# Patient Record
Sex: Male | Born: 1975 | Race: White | Hispanic: No | Smoking: Never smoker
Health system: Southern US, Community
[De-identification: ages and names within clinical notes are randomized; demographics above are authoritative.]

## PROBLEM LIST (undated history)

## (undated) DIAGNOSIS — M7750 Other enthesopathy of unspecified foot: Secondary | ICD-10-CM

## (undated) DIAGNOSIS — Z87442 Personal history of urinary calculi: Secondary | ICD-10-CM

## (undated) DIAGNOSIS — R079 Chest pain, unspecified: Secondary | ICD-10-CM

## (undated) DIAGNOSIS — K219 Gastro-esophageal reflux disease without esophagitis: Secondary | ICD-10-CM

## (undated) DIAGNOSIS — D239 Other benign neoplasm of skin, unspecified: Secondary | ICD-10-CM

## (undated) DIAGNOSIS — Z8489 Family history of other specified conditions: Secondary | ICD-10-CM

## (undated) DIAGNOSIS — M722 Plantar fascial fibromatosis: Secondary | ICD-10-CM

## (undated) DIAGNOSIS — Z91018 Allergy to other foods: Secondary | ICD-10-CM

## (undated) DIAGNOSIS — G4700 Insomnia, unspecified: Secondary | ICD-10-CM

## (undated) DIAGNOSIS — Z8719 Personal history of other diseases of the digestive system: Secondary | ICD-10-CM

## (undated) DIAGNOSIS — J302 Other seasonal allergic rhinitis: Secondary | ICD-10-CM

## (undated) DIAGNOSIS — M2550 Pain in unspecified joint: Secondary | ICD-10-CM

## (undated) HISTORY — DX: Gastro-esophageal reflux disease without esophagitis: K21.9

## (undated) HISTORY — DX: Allergy to other foods: Z91.018

## (undated) HISTORY — DX: Plantar fascial fibromatosis: M72.2

## (undated) HISTORY — DX: Chest pain, unspecified: R07.9

## (undated) HISTORY — DX: Insomnia, unspecified: G47.00

## (undated) HISTORY — DX: Other enthesopathy of unspecified foot and ankle: M77.50

## (undated) HISTORY — DX: Pain in unspecified joint: M25.50

## (undated) HISTORY — DX: Other benign neoplasm of skin, unspecified: D23.9

## (undated) HISTORY — DX: Other seasonal allergic rhinitis: J30.2

---

## 2003-09-17 HISTORY — PX: OTHER SURGICAL HISTORY: SHX169

## 2006-04-04 ENCOUNTER — Ambulatory Visit: Payer: Self-pay | Admitting: Orthopaedic Surgery

## 2006-09-16 HISTORY — PX: SHOULDER SURGERY: SHX246

## 2010-09-04 ENCOUNTER — Ambulatory Visit: Payer: Self-pay | Admitting: Orthopaedic Surgery

## 2011-09-17 HISTORY — PX: KNEE SURGERY: SHX244

## 2011-11-05 ENCOUNTER — Inpatient Hospital Stay: Payer: Self-pay | Admitting: Internal Medicine

## 2011-11-05 LAB — URINALYSIS, COMPLETE
Bacteria: NONE SEEN
Bilirubin,UR: NEGATIVE
Blood: NEGATIVE
Glucose,UR: >=500 mg/dL (ref 0–75)
Nitrite: NEGATIVE
Ph: 5 (ref 4.5–8.0)
RBC,UR: 1 /HPF (ref 0–5)
Squamous Epithelial: NONE SEEN

## 2011-11-05 LAB — CBC WITH DIFFERENTIAL/PLATELET
Basophil #: 0 10*3/uL (ref 0.0–0.1)
Basophil %: 0.4 %
Eosinophil #: 0.1 10*3/uL (ref 0.0–0.7)
Eosinophil %: 1.2 %
HCT: 47.1 % (ref 40.0–52.0)
HGB: 16.3 g/dL (ref 13.0–18.0)
Lymphocyte #: 1.8 10*3/uL (ref 1.0–3.6)
Lymphocyte %: 18.6 %
MCH: 29.1 pg (ref 26.0–34.0)
MCHC: 34.6 g/dL (ref 32.0–36.0)
MCV: 84 fL (ref 80–100)
Neutrophil #: 7.2 10*3/uL — ABNORMAL HIGH (ref 1.4–6.5)
Neutrophil %: 73.7 %
RBC: 5.61 10*6/uL (ref 4.40–5.90)
RDW: 13 % (ref 11.5–14.5)

## 2011-11-05 LAB — COMPREHENSIVE METABOLIC PANEL
Albumin: 4.5 g/dL (ref 3.4–5.0)
Anion Gap: 17 — ABNORMAL HIGH (ref 7–16)
BUN: 21 mg/dL — ABNORMAL HIGH (ref 7–18)
Calcium, Total: 8.7 mg/dL (ref 8.5–10.1)
Chloride: 92 mmol/L — ABNORMAL LOW (ref 98–107)
Co2: 21 mmol/L (ref 21–32)
EGFR (African American): >60
Osmolality: 294 (ref 275–301)
SGOT(AST): 36 U/L (ref 15–37)
SGPT (ALT): 53 U/L
Total Protein: 8.5 g/dL — ABNORMAL HIGH (ref 6.4–8.2)

## 2011-11-06 LAB — BASIC METABOLIC PANEL
Anion Gap: 13 (ref 7–16)
Calcium, Total: 8.2 mg/dL — ABNORMAL LOW (ref 8.5–10.1)
Chloride: 107 mmol/L (ref 98–107)
Creatinine: 0.91 mg/dL (ref 0.60–1.30)
EGFR (African American): >60
EGFR (Non-African Amer.): >60
Glucose: 166 mg/dL — ABNORMAL HIGH (ref 65–99)
Potassium: 3.1 mmol/L — ABNORMAL LOW (ref 3.5–5.1)
Sodium: 142 mmol/L (ref 136–145)

## 2011-11-06 LAB — HEMOGLOBIN A1C: Hemoglobin A1C: 10.6 % — ABNORMAL HIGH (ref 4.2–6.3)

## 2011-11-07 LAB — BASIC METABOLIC PANEL
Anion Gap: 12 (ref 7–16)
Calcium, Total: 7.9 mg/dL — ABNORMAL LOW (ref 8.5–10.1)
Chloride: 109 mmol/L — ABNORMAL HIGH (ref 98–107)
Co2: 19 mmol/L — ABNORMAL LOW (ref 21–32)
Creatinine: 0.75 mg/dL (ref 0.60–1.30)
Potassium: 3.4 mmol/L — ABNORMAL LOW (ref 3.5–5.1)
Sodium: 140 mmol/L (ref 136–145)

## 2011-12-10 ENCOUNTER — Ambulatory Visit: Payer: Self-pay

## 2011-12-16 ENCOUNTER — Ambulatory Visit: Payer: Self-pay

## 2012-01-15 ENCOUNTER — Ambulatory Visit: Payer: Self-pay

## 2013-03-13 ENCOUNTER — Emergency Department: Payer: Self-pay | Admitting: Internal Medicine

## 2013-03-24 ENCOUNTER — Emergency Department: Payer: Self-pay | Admitting: Emergency Medicine

## 2013-04-27 ENCOUNTER — Ambulatory Visit: Payer: Self-pay

## 2013-05-17 ENCOUNTER — Ambulatory Visit: Payer: Self-pay

## 2013-05-20 ENCOUNTER — Ambulatory Visit: Payer: Self-pay | Admitting: Unknown Physician Specialty

## 2013-05-20 LAB — BASIC METABOLIC PANEL
Anion Gap: 6 — ABNORMAL LOW (ref 7–16)
BUN: 20 mg/dL — ABNORMAL HIGH (ref 7–18)
Calcium, Total: 8.9 mg/dL (ref 8.5–10.1)
Co2: 26 mmol/L (ref 21–32)
Creatinine: 0.99 mg/dL (ref 0.60–1.30)
EGFR (African American): >60
EGFR (Non-African Amer.): >60
Potassium: 3.7 mmol/L (ref 3.5–5.1)
Sodium: 138 mmol/L (ref 136–145)

## 2013-05-26 ENCOUNTER — Ambulatory Visit: Payer: Self-pay | Admitting: Orthopedic Surgery

## 2013-06-16 ENCOUNTER — Ambulatory Visit: Payer: Self-pay

## 2013-07-21 ENCOUNTER — Ambulatory Visit: Payer: Self-pay

## 2013-08-16 ENCOUNTER — Ambulatory Visit: Payer: Self-pay

## 2014-02-04 ENCOUNTER — Ambulatory Visit: Payer: Self-pay | Admitting: Family Medicine

## 2014-03-30 ENCOUNTER — Encounter: Payer: Self-pay | Admitting: *Deleted

## 2014-11-28 ENCOUNTER — Emergency Department: Payer: Self-pay | Admitting: Emergency Medicine

## 2015-01-06 NOTE — Op Note (Signed)
PATIENT NAMEKAJ, [name] MR#:  [id number] DATE OF BIRTH:  [birth year]  DATE OF PROCEDURE:  05/26/2013  PREOPERATIVE DIAGNOSIS: Right knee patellar dislocation.   POSTOPERATIVE DIAGNOSIS:  Right knee patellar dislocation with lateral meniscus tear and medial plica band.   PROCEDURES: 1.  Arthroscopy, right knee lateral release. 2.  Partial lateral meniscectomy.  3.  Excision plica.   ANESTHESIA: General.   SURGEON: Laurene Footman, M.D.   DESCRIPTION OF PROCEDURE: The patient was brought to the Operating Room and after adequate anesthesia was obtained, the leg was prepped and draped in the usual sterile fashion with a tourniquet applied to the upper leg. After patient identification and timeout procedures were completed, an inferolateral portal was made and the arthroscope was introduced. 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 There were multiple small fragments of loose cartilage floating around the knee, but nothing large enough to be a loose body. There was a very tight lateral patellofemoral ligament that came down as a band that appeared to be causing subluxation of the patella. Coming around medially, an inferomedial portal was made, and the medial meniscus was intact. There was again significant chondromalacia of the medial compartment. Anterior cruciate ligament was intact. Going laterally, there was a flap tear of the posterior horn of the lateral meniscus that with a portion flipped up into the notch, this was debrided with use of the meniscal punch and ArthroCare wand. Again, there was chondromalacia in the lateral compartment with some partial thickness cartilage loss in all compartments. The gutters were checked and there were no loose bodies. At this point, additionally, there had been noted a very thick medial plica. This was ablated at this point going laterally with the ArthroCare wand. The very thick ligament was released.  The capsule did not appear to be tight on the lateral side other than this tight band. After releasing this band, the patella tracked normally and so no further reconstruction was deemed required. The knee was very thoroughly irrigated to get rid of the small fragments. The instrumentation was withdrawn, portals closed with a 4-0 nylon in a simple interrupted fashion. 30 mL of 0.5% Sensorcaine with epinephrine was infiltrated into the portals and the area of the lateral release. Xeroform, 4 x 4's, Webril and Ace wrap were applied and the patient sent to  recovery in stable condition.    ESTIMATED BLOOD LOSS: Minimal.   COMPLICATIONS: None.   SPECIMEN: None.    ____________________________ Laurene Footman, MD mjm:nts D: 05/27/2013 02:09:06 ET T: 05/27/2013 03:27:47 ET JOB#: [id number]  cc: Laurene Footman, MD, <Dictator> Laurene Footman MD ELECTRONICALLY SIGNED 05/27/2013 11:12

## 2015-01-08 NOTE — Consult Note (Signed)
PATIENT NAMEANUP, [name] MR#:  [id number] DATE OF BIRTH:  [birth year]  DATE OF CONSULTATION:  11/06/2011  REFERRING PHYSICIAN:  Fritzi Mandes, MD  CONSULTING PHYSICIAN:  A. Lavone Orn, MD PRIMARY CARE PHYSICIAN: Dr. Luan Pulling, in [place]: New onset diabetes.   HISTORY OF PRESENT ILLNESS: This is a 39 year old male with a medical history of hypertension who was admitted yesterday with severe hyperglycemia due to newly diagnosed diabetes. He initially had blurred vision with polyuria and polydipsia, those symptoms have all improved. He did not have nausea or vomiting. He also reports an approximately 10-pound weight loss over the prior week. He has not had any recent illness. No new medication and specifically denies use of glucocorticoids. He was treated with IV fluids as well as IV insulin and monitored in the Cardiac Care Unit. He has been given subcutaneous Lantus 30 units at 9:00 this morning, and NovoLog 5 units t.i.d. before meals has been ordered. He has a diet ordered, and he tolerated his lunch today.  PAST MEDICAL HISTORY:  1. Hypertension.  2. Acid reflux.   OUTPATIENT MEDICATIONS:  1. Diovan/HCT 80/12.5 mg daily.  2. Nexium 40 mg daily.   ALLERGIES: No known drug allergies.   FAMILY HISTORY: Father has type 2 diabetes.   SOCIAL HISTORY: The patient is married. He does not smoke cigarettes and rarely drinks alcohol. He is employed.    REVIEW OF SYSTEMS: GENERAL: Weight loss as per history of present illness. He has had fatigue. HEENT: He has had blurred vision, this is improving. He denies sore throat. NECK: Denies neck pain or dysphagia. CARDIAC: Denies chest pain or palpitations. PULMONARY: Denies cough or shortness of breath. ABDOMEN: Denies abdominal pain. No nausea or vomiting. Appetite is fair. Denies change in bowel habits. EXTREMITIES: Denies leg swelling. SKIN: Denies rash or skin changes. NEUROLOGIC: Denies numbness or tingling in the feet. ENDOCRINE:  Denies heat or cold intolerance. LYMPH: Denies easy bruisability or recent bleeding.   PHYSICAL EXAMINATION:  VITAL SIGNS: Height 72.9 inches, weight 275 pounds. Pulse 86, respirations 18, blood pressure 123/81, pulse oximetry 100% on room air. Temperature 97.8.   GENERAL: A well-developed obese white male in no acute distress.   HEENT: Extraocular movements are intact. No proptosis, lid lag or stare.   NECK: No thyromegaly. No palpable thyroid nodules.   LYMPH: No submandibular or anterior cervical lymphadenopathy.   CARDIAC: Regular rate and rhythm without audible murmurs.   PULMONARY: Clear to auscultation bilaterally. No wheeze. No rhonchi.   ABDOMEN: Diffusely soft, nontender, nondistended. Positive bowel sounds.   EXTREMITIES: No edema is present.   SKIN: No rash or dermatopathy noted.   NEUROLOGIC: No sensory deficits. No tremor. Speech is normal.   PSYCHIATRIC: Alert and oriented x3.   LABORATORY, DIAGNOSTIC AND RADIOLOGICAL DATA:  Glucose 166, BUN 18, creatinine 0.91, sodium 142, potassium 3.1, chloride 107, CO2 22, calcium 8.2. Hemoglobin A1c 10.6%. A chest x-ray done earlier today showed no evidence of acute disease.   ASSESSMENT: A 39 year old male presenting with hyperglycemic hyperosmolar state due to new onset diabetes.   RECOMMENDATIONS:  1. I agree with transition off IV insulin. Current doses of ordered Lantus and NovoLog are reasonable. I will add a NovoLog sliding scale.  2. Ensure diabetes educators have been consulted. The patient needs to be instructed on self-monitoring of blood sugars as well as injections of insulin.  3. I am hopeful we can transition to diabetes oral medications in the future. However, I would  prefer we get adequate control of blood sugars on multiple daily injections of insulin initially.  4. He would benefit from weight loss and regular exercise.  5. I suggested a follow-up as outpatient in two weeks, and he was agreeable.   Thank  you for the kind request for consultation. I will follow along with you.   ____________________________ A. Lavone Orn, MD ams:cbb D: 11/06/2011 15:24:16 ET T: 11/06/2011 15:47:04 ET JOB#: [id number]  cc: A. Lavone Orn, MD, <Dictator> Sherlon Handing MD ELECTRONICALLY SIGNED 11/10/2011 15:34

## 2015-01-08 NOTE — H&P (Signed)
PATIENT NAMEKERRIE, [name] MR#:  [id number] DATE OF BIRTH:  1976-07-11  DATE OF ADMISSION:  11/05/2011  PRIMARY CARE PHYSICIAN: Dr. Luan Pulling   CHIEF COMPLAINT: Increased thirst, weakness and increased urination for a week to 10 days.   HISTORY OF PRESENT ILLNESS: [name] is a very pleasant 40 year old Caucasian gentleman with past medical history of hypertension and acid reflux comes to the Emergency Room after he started having symptoms of feeling fatigued, tired and increased urination with excessive thirst for about 7 to 10 days. Patient was seen at Dr. Luan Pulling' office where the PA checked the fingerstick and it read as critical high. He was sent to the Emergency Room. He was found to have blood sugar of 600. He was dehydrated and was found to be in hyperosmolar hyperglycemic state with new onset diagnosis of type 2 diabetes. Patient is being admitted for further evaluation and management.   PAST MEDICAL HISTORY:  1. Hypertension.  2. Acid reflux.   ALLERGIES: No known drug allergies.   MEDICATIONS:  1. Diovan/hydrochlorothiazide 80/12.5 mg p.o. daily.  2. Nexium 40 mg p.o. daily.   FAMILY HISTORY: Father with diagnosis of type 2 diabetes.   SOCIAL HISTORY: Married, nonsmoker, nonalcoholic. 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    REVIEW OF SYSTEMS: CONSTITUTIONAL: Positive for fatigue, weakness. EYES: No blurred or double vision or any glaucoma. ENT: No tinnitus, ear pain, hearing loss. Positive for dry mouth. RESPIRATORY: No cough, wheeze, hemoptysis. CARDIOVASCULAR: No chest pain, orthopnea, edema. Positive for hypertension. GASTROINTESTINAL: No nausea, vomiting, diarrhea, abdominal pain. GENITOURINARY: No dysuria, hematuria. ENDOCRINE: Positive for polyuria, polydipsia and polyhydria. HEMATOLOGY: No anemia. SKIN: No acne, rash. MUSCULOSKELETAL: No arthritis. NEUROLOGIC: No cerebrovascular accident, transient ischemic attack. PSYCH: No anxiety or depression. All  other systems reviewed and negative.   PHYSICAL EXAMINATION:  GENERAL: Patient is awake, alert, oriented x3, not in acute distress.   VITAL SIGNS: Afebrile, pulse 108 regular, blood pressure 138/92, sats 98% on room air.   HEENT: Atraumatic, normocephalic. Pupils are equal, round, and reactive to light and accommodation. Extraocular movements intact. Oral mucosa is dry.   NECK: Supple. No JVD. No carotid bruit.   RESPIRATORY: Clear to auscultation bilaterally. No rales, rhonchi, respiratory distress, or labored breathing.   CARDIOVASCULAR: Both the heart sounds are normal. Rhythm is regular, rate is tachycardic. No murmur heard. PMI not lateralized. Chest nontender.   EXTREMITIES: Good pedal pulses, good femoral pulses. No lower extremity edema.   ABDOMEN: Soft, benign, nontender. No organomegaly. Positive bowel sounds.   NEUROLOGIC: Grossly intact cranial nerves II through XII. No motor or sensory deficit.   PSYCHIATRIC: Patient is awake, alert, oriented x3.   SKIN: Warm and dry.   LABORATORY, DIAGNOSTIC AND RADIOLOGICAL DATA: pH 7.31. CBC within normal limits. BUN 21, glucose 600, creatinine 1.5, sodium 130, chloride 92, bicarbonate 21, alkaline phosphatase 147, anion gap 17. Urinalysis negative for urinary tract infection. Urine ketones is trace. Urine glucose is more than 500.   EKG shows normal sinus rhythm with sinus arrhythmia.    ASSESSMENT AND PLAN: 39 year old [name] with:  1. Hyperosmolar hyperglycemic state with new onset diagnosis of type 2 diabetes.  2. Dehydration from #1.  3. Pseudohyponatremia secondary to elevated sugars.  4. Hypertension.  5. Acute renal failure secondary to dehydration. 6. Gastroesophageal reflux disease.   7. Hypertension.   PLAN:  1. Admit patient to Critical Care Unit step down.  2. Will start patient on nondiabetic ketoacidosis insulin protocol.  3. IV  fluids.  4. Monitor metabolic panel closely.  5. Dr. Gabriel Carina for endocrinology for  new onset diabetes.  6. Continue Diovan. I will hold off on hydrochlorothiazide.  7. Continue Nexium for gastroesophageal reflux disease.  8. Will check hemoglobin A1c.  9. Further work-up according to patient's clinical course. Hospital admission plan was discussed with patient and the patient's wife.   TIME SPENT: 50 minutes.   ____________________________ Jon Rochester Posey Pronto, MD sap:cms D: 11/05/2011 17:22:48 ET T: 11/06/2011 05:41:54 ET JOB#: [id number]  cc: Jon Tibbitts A. Posey Pronto, MD, <Dictator> Jon Porta., MD Jon Basset MD ELECTRONICALLY SIGNED 11/10/2011 11:36

## 2015-01-08 NOTE — Discharge Summary (Signed)
PATIENT NAMEANIKIN, [name]:  [id number] DATE OF BIRTH:  October 28, 1975  DATE OF ADMISSION:  11/05/2011 DATE OF DISCHARGE:  11/07/2011  ADMITTING PHYSICIAN: Dr. Fritzi Mandes    DISCHARGING PHYSICIAN: Dr. Gladstone Lighter     PRIMARY CARE PHYSICIAN: Dr. Golden Pop  CONSULTATIONS IN THE HOSPITAL:  Endocrinology consultation by Dr. Gabriel Carina.   DISCHARGE DIAGNOSES:  1. Hyperosmolar, hyperglycemic, nonketotic coma?. 2. New onset diabetes mellitus with hemoglobin A1c of 10.6.  3. Hypertension.  4. Hypokalemia in the hospital.  5. Gastroesophageal reflux disease.   DISCHARGE MEDICATIONS:  1. Lantus Solostar pen  35 units subcutaneous daily. 2. Apidra Solostar pen 8 units 3 times daily prior to meals.  3. Diovan/ HCTZ 80/12.5 mg 1 tablet p.o. daily.  4. Nexium 40 mg p.o. daily.   DISCHARGE DIET:  Low sodium, 1800 ADA diet.   DISCHARGE ACTIVITY: As tolerated.     FOLLOWUP INSTRUCTIONS:  1. Follow up with Dr. Gabriel Carina in two weeks- appointment scheduled for 03/01 at 3 p.m.  2. Primary care physician followup in 1 to 2 weeks.  3. Fingerstick checks at least twice a day. Goal for fasting fingerstick should be less than 120 and random sugars less than 200. Call physician if sugars greater than 350 or less than 60.    LABS/STUDIES: At the time of discharge, sodium 140, potassium 3.4, chloride 109, bicarbonate 19, BUN 15, creatinine 0.75, glucose 205, calcium 7.9.  WBC 9.8, hemoglobin 16.3, hematocrit 47.1, platelet count 236. Chest x-ray on admission showing no acute cardiopulmonary disease. Portable chest x-ray showed possible left upper lung small nodular density but repeat follow-up chest x-ray PA and lateral did not identify the nodule in the left upper lobe.   HbA1c is 10.6.   BRIEF HOSPITAL COURSE: Mr. Gotay is a 39 year old gentleman with past medical history significant for hypertension and gastroesophageal reflux disease who comes to the Emergency Room from his primary care physician's  office secondary to elevated blood sugar of 600.  The patient has been having polyuria, polydipsia, and also blurred vision over the last week. He states he has history of borderline, high normal elevated sugars and has strong family history of diabetes mellitus.  He was found to be in hyperosmolar, hyperglycemic, nonketotic coma? with sugars greater than 600 and anion gap of 17. He was admitted to the Critical Care Unit on HHNK protocol.  1. New onset diabetes mellitus with hyperosmolar, hyperglycemic, nonketotic state: The patient was placed on insulin drip until his anion gap was closed. He was given IV fluids and once the gap was closed based on the protocol he was changed over to Lantus. His sugars are currently better controlled with Lantus of 35 units subcutaneous daily and he was also advised to do short-acting glulisine insulin prior to meals 3 times a day. He was seen by endocrinologist Dr. Gabriel Carina who has helped in adjusting the insulin regimen and will follow up with the patient as an outpatient. The patient has yearly ophthalmology checks for his vision and has one lined up in 12/2011. He is advised to keep that appointment.  He was counseled regarding diabetic diet. He was also taught about giving subcutaneous injections and fingersticks. 2. Acute renal failure and hypokalemia secondary to dehydration, nausea, vomiting on presentation: Those were replaced appropriately and creatinine normalized at the time of discharge.  3. Hypertension: The patient can continue taking his home medications of Diovan and HCTZ.  4. Gastroesophageal reflux disease: On Nexium. 5. His course has been  otherwise uneventful in the hospital.   DISCHARGE CONDITION: Stable.   DISCHARGE DISPOSITION: Home.      TIME SPENT ON DISCHARGE: 45 minutes.     ____________________________ Gladstone Lighter, MD rk:bjt D: 11/07/2011 14:46:43 ET T: 11/07/2011 17:43:17 ET JOB#: [id number]  cc: Gladstone Lighter, MD,  <Dictator> Guadalupe Maple, MD A. Lavone Orn, MD Gladstone Lighter MD ELECTRONICALLY SIGNED 11/08/2011 15:54

## 2015-01-08 NOTE — Consult Note (Signed)
Chief Complaint and History:   Referring Physician Fritzi Mandes, MD    Chief Complaint new onset DM   Allergies:  NKDA: None  Tomato: GI Distress  Assessment/Plan:   Assessment/Plan Patient was seen and examined and chart was reviewed. He presented with symptoms of hyperglycemia and was found to have hyperglycemic nonketotic state with new onset diabetes, likely DM2.  He was treated with IV insulin and fluids and sugars have improved. He has been given SQ insulin and the insulin gtt will be stopped.  A / New onset diabetes      HHNK, resolved  P/ Agree with Lantus and prandial NovoLog dosing Will add a NovoLog SSI Needs diabetes teaching I will arrange for out-pt follow up in 2 weeks.  I will follow with you.  Full consult has been dictated.    Case Discussed With patient, family, nursing   Electronic Signatures: Judi Cong (MD)  (Signed [phone] 15:17)  Authored: Chief Complaint and History, ALLERGIES, Assessment/Plan   Last Updated: 20-Feb-13 15:17 by Judi Cong (MD)

## 2015-02-28 ENCOUNTER — Ambulatory Visit: Payer: 59 | Attending: Podiatry

## 2015-02-28 DIAGNOSIS — M25673 Stiffness of unspecified ankle, not elsewhere classified: Secondary | ICD-10-CM

## 2015-02-28 DIAGNOSIS — R531 Weakness: Secondary | ICD-10-CM | POA: Insufficient documentation

## 2015-02-28 DIAGNOSIS — R29898 Other symptoms and signs involving the musculoskeletal system: Secondary | ICD-10-CM | POA: Diagnosis present

## 2015-02-28 NOTE — Therapy (Signed)
[name] [facility]IN [facility] SERVICES [address] [place], [place], [address] Phone: [phone]   Fax:  [phone]  Physical Therapy Evaluation  Patient Details  Name: [name] MRN: [id number] Date of Birth: July 03, 1976 Referring Provider:  Samara Deist, DPM  Encounter Date: 02/28/2015      PT End of Session - 02/28/15 1351    Visit Number 1   Number of Visits 9   Date for PT Re-Evaluation 03/28/15   PT Start Time 1000   PT Stop Time 1100   PT Time Calculation (min) 60 min   Activity Tolerance Patient tolerated treatment well   Behavior During Therapy [facility] for tasks assessed/performed      Past Medical History  Diagnosis Date  . Hypertension   . Insomnia     History reviewed. No pertinent past surgical history.  There were no vitals filed for this visit.  Visit Diagnosis:  Weakness - Plan: PT plan of care cert/re-cert  Decreased ROM of ankle - Plan: PT plan of care cert/re-cert      Subjective Assessment - 02/28/15 1314    Subjective Pt relates he has been having L LE achilles pain since March 2016.  Pt reports he began experiencing sudden pain after a run and denies hearing any popping.  Pt tried rest, icing, compression and elevatiing his leg to decerae the pain.  Pt reports he han an x-ray taken 4-5 weeks ago which revealed a bone spur in his heel  and two weeks ago was nstructed to use a walking for a total of 6 weeks by his doctor.  Pt is uncertain  if he has to wear the boot during all  standing activities.  Pt reports prior history of plantar fasciitis in his L LE.  Pt reports 0/10 pain currently, and 3/10 in the evening after work as his worst pain.    Pertinent History history of L LE plantar fasciitis   Limitations Standing;Walking   Patient Stated Goals To reduce pain and discontinue using the boot.    Currently in Pain? No/denies   Pain Score 0-No pain   Pain Location Ankle   Pain Orientation Left   Pain  Descriptors / Indicators Burning;Sharp   Pain Type Chronic pain   Pain Onset More than a month ago   Pain Frequency Intermittent   Aggravating Factors  working   Pain Relieving Factors rest   Multiple Pain Sites No            [facility] PT Assessment - 02/28/15 0001    Assessment   Medical Diagnosis achilles tendonitis    Onset Date/Surgical Date 11/17/14   Hand Dominance Right   Next MD Visit 03/2015   Prior Therapy none   Precautions   Precautions --  walking boot on the L LE   Required Braces or Orthoses --  walking boot   Balance Screen   Has the patient fallen in the past 6 months No   Has the patient had a decrease in activity level because of a fear of falling?  Yes   Is the patient reluctant to leave their home because of a fear of falling?  No   Home Environment   Living Environment Private residence   Living Arrangements Spouse/significant other   Available Help at Discharge Family   Type of [place] Two level   Alternate Level Stairs-Number of Steps 20   Alternate Level Stairs-Rails Left   Home Equipment None  Prior Function   Level of Independence Independent   Vocation Full time employment   Leisure motorcycles   Cognition   Overall Cognitive Status Within Functional Limits for tasks assessed   Observation/Other Assessments   Observations walking boot on the L LE   Skin Integrity intact   Sensation   Light Touch Appears Intact   Coordination   Gross Motor Movements are Fluid and Coordinated Yes   Posture/Postural Control   Posture/Postural Control No significant limitations   ROM / Strength   AROM / PROM / Strength AROM;Strength   AROM   Overall AROM  Deficits   AROM Assessment Site Ankle   Right/Left Ankle Left   Left Ankle Dorsiflexion 14   Left Ankle Plantar Flexion 45   Left Ankle Inversion 30   Left Ankle Eversion 18   Strength   Overall Strength Within functional limits for tasks performed   Strength Assessment Site Ankle    Right/Left Ankle Left   Left Ankle Dorsiflexion 5/5   Left Ankle Inversion 5/5   Left Ankle Eversion 5/5      Reflexes: L4 and S1 absent bilaterally  MMT Pt scored 5/5 in all planes in his left ankle except plantar flexion which was not assessed due to possible restriction  Girt measurement of his calf, 30 cm proximal from lateral malleolus was 42 cm vs 46 on the right.     Therex:  Plantar flexion with yellow theraband in long sitting 2x10 Calf stretch in long sitting 3x30 sec                  PT Education - 02/28/15 1349    Education provided Yes   Education Details benefits of strengthening and stretching calf musculature   Person(s) Educated Patient   Methods Explanation   Comprehension Verbalized understanding             PT Long Term Goals - 02/28/15 1401    PT LONG TERM GOAL #1   Title Pt will be able to ambulate 500 ft without boot with pain less than 2/10   Baseline ambulates in a boot   Period Weeks   PT LONG TERM GOAL #2   Title Pt will be able to score 5/5 MMT of L calf to be able to perform functional activites such step over step stair negotiation    Baseline unable to perform due to walking boot   Time 4   Status New   PT LONG TERM GOAL #3   Title Pt will improve LEFS score by at least 10 points to improve quality of life   Baseline 49/80   Time 4   Period Weeks   Status New   PT LONG TERM GOAL #4   Title pt's dorsiflexion will improve to at least 15 degrees in order to descend steps painfree in his house   Baseline pain at 8 degrees of dorsiflexion    Time 4   Period Weeks   Status New               Plan - 02/28/15 1352    Clinical Impression Statement Pt is a 39 year old male who presents with chronic L LE achillies pain that started in March after a run.  Pt was instructed to use the walking boot for a total 6 weeks ( 4 weeks remaining) and seek skilled therapy to improve his deficits.  Pt has decreased L LE ankle  motion in all planes, atrophy in his  L calf (42 cm on left versus 46 cm on the right). Pt presents with tightness in his calf/achillies area upon palpation especially distally. Pt would benefit from gradual progression of strength, ROM of his left ankle to return to prior level of functin.     Pt will benefit from skilled therapeutic intervention in order to improve on the following deficits Decreased strength;Decreased mobility;Impaired flexibility;Decreased range of motion;Pain;Difficulty walking   Rehab Potential Good   PT Frequency 2x / week   PT Duration 4 weeks   PT Treatment/Interventions Aquatic Therapy;Cryotherapy;Iontophoresis 4mg /ml Dexamethasone;Therapeutic exercise;Therapeutic activities;Functional mobility training;Stair training;Neuromuscular re-education;Manual techniques;Dry needling;Passive range of motion;Scar mobilization;Balance training   PT Next Visit Plan soft tissue mobiliztion, progress strength exercises         Problem List There are no active problems to display for this patient. Gorden Harms. Panagiotis Oelkers, PT, DPT [phone]   Shonta Bourque 02/28/2015, 5:52 PM  [place] [facility]IN [facility] SERVICES [address] [place], [place], [address] Phone: [phone]   Fax:  [phone]

## 2015-02-28 NOTE — Patient Instructions (Signed)
[email or web address] Plantar flexion with yellow theraband 2x15, 2x/day Seated calf stretch 2x30 sec, 2x/day

## 2015-03-02 ENCOUNTER — Ambulatory Visit: Payer: 59

## 2015-03-02 DIAGNOSIS — M25673 Stiffness of unspecified ankle, not elsewhere classified: Secondary | ICD-10-CM

## 2015-03-02 DIAGNOSIS — R531 Weakness: Secondary | ICD-10-CM

## 2015-03-02 NOTE — Therapy (Signed)
[place] MAIN [facility] SERVICES [address] [place], [place], [address] Phone: [phone]   Fax:  [phone]  Physical Therapy Treatment  Patient Details  Name: [name] MRN: [id number] Date of Birth: Jun 19, 1976 Referring Provider:  Samara Deist, DPM  Encounter Date: 03/02/2015      PT End of Session - 03/02/15 1833    Visit Number 2   Number of Visits 9   Date for PT Re-Evaluation 03/28/15   PT Start Time 1645   PT Stop Time 1730   PT Time Calculation (min) 45 min   Activity Tolerance Patient tolerated treatment well   Behavior During Therapy [name] for tasks assessed/performed      Past Medical History  Diagnosis Date  . Hypertension   . Insomnia     History reviewed. No pertinent past surgical history.  There were no vitals filed for this visit.  Visit Diagnosis:  Weakness  Decreased ROM of ankle      Subjective Assessment - 03/02/15 1828    Subjective Pt relates he is doing well today and reports no pain.  Pt relates he will try to get shoe left to facilitate walking more leveld.     Currently in Pain? No/denies   Pain Score 0-No pain   Pain Location Ankle   Pain Orientation Left        Warm up: moist heat x 5 min  Manual therapy:   Soft tissue mobilization to left calf x 15 min to decrease tightness and improve motion in his ankle Passive calf stretch 3x30 sec to improve ankle dorsiflexion Cross friction massage to achillies insertion to improve healing and alignment  of fibers.   X 5 min  Passive soleus stretch 3x30 sec to improve ankle dorsiflexion, decrease tension in the calf  There ex: Eccentric plantarflexion with red theraband 3x10, pt required verbal and tactile cueing for correct form Soleus stretch with strap 2x20 sec to improve ankle dorsiflexion and decrease tension in the calf Calf stretch with strap in long sitting 2x20 to decrease tension in the  calf                           PT Education - 03/02/15 1832    Education provided Yes   Education Details the benefits of eccentric plantarflexion    Person(s) Educated Patient   Methods Explanation   Comprehension Verbalized understanding             PT Long Term Goals - 02/28/15 1401    PT LONG TERM GOAL #1   Title Pt will be able to ambulate 500 ft without boot with pain less than 2/10   Baseline ambulates in a boot   Period Weeks   PT LONG TERM GOAL #2   Title Pt will be able to score 5/5 MMT of L calf to be able to perform functional activites such step over step stair negotiation    Baseline unable to perform due to walking boot   Time 4   Status New   PT LONG TERM GOAL #3   Title Pt will improve LEFS score by at least 10 points to improve quality of life   Baseline 49/80   Time 4   Period Weeks   Status New   PT LONG TERM GOAL #4   Title pt's dorsiflexion will improve to at least 15 degrees in order to descend steps painfree in his house  Baseline pain at 8 degrees of dorsiflexion    Time 4   Period Weeks   Status New               Plan - 03/02/15 1834    Clinical Impression Statement Pt did not experience an increase in pain with today's treatment.  soft tissue moblization to left calf after moist heat helped decrease tight decreae tightness in his calf.  Pt demonstrates weak  plantarflexors and decreased control during eccentric plantarflexin with red theraband, thus benefit will continue to benefit from continued skilled PT services to address weakness and remaining deficits.     Pt will benefit from skilled therapeutic intervention in order to improve on the following deficits Decreased strength;Decreased mobility;Impaired flexibility;Decreased range of motion;Pain;Difficulty walking   Rehab Potential Good   PT Frequency 2x / week   PT Duration 4 weeks   PT Treatment/Interventions Aquatic Therapy;Cryotherapy;Iontophoresis 4mg /ml  Dexamethasone;Therapeutic exercise;Therapeutic activities;Functional mobility training;Stair training;Neuromuscular re-education;Manual techniques;Dry needling;Passive range of motion;Scar mobilization;Balance training        Problem List There are no active problems to display for this patient.  [name], PT, DPT [phone]  [name] 03/02/2015, 6:41 PM  [place] MAIN [facility] SERVICES [address] [place], [place], [address] Phone: [phone]   Fax:  [phone]

## 2015-03-02 NOTE — Patient Instructions (Signed)
Eccentric plantarflexion with theraband with a 10 second count  2x10

## 2015-03-07 ENCOUNTER — Ambulatory Visit: Payer: 59

## 2015-03-07 DIAGNOSIS — R531 Weakness: Secondary | ICD-10-CM | POA: Diagnosis not present

## 2015-03-07 DIAGNOSIS — M25673 Stiffness of unspecified ankle, not elsewhere classified: Secondary | ICD-10-CM

## 2015-03-07 NOTE — Therapy (Addendum)
[place] MAIN [facility] SERVICES [address] [place], [place], [address] Phone: [phone]   Fax:  [phone]  Physical Therapy Treatment  Patient Details  Name: [name] MRN: [id number] Date of Birth: [birth year] Referring Provider:  Samara Deist, DPM  Encounter Date: 03/07/2015      PT End of Session - 03/07/15 1116    Visit Number 3   Number of Visits 9   Date for PT Re-Evaluation 03/28/15   PT Start Time 0945   PT Stop Time 1030   PT Time Calculation (min) 45 min   Activity Tolerance Patient tolerated treatment well   Behavior During Therapy [facility] for tasks assessed/performed      Past Medical History  Diagnosis Date  . Hypertension   . Insomnia     History reviewed. No pertinent past surgical history.  There were no vitals filed for this visit.  Visit Diagnosis:  Weakness  Decreased ROM of ankle      Subjective Assessment - 03/07/15 1108    Subjective Pt relates he had increased pain of 4-5/10/sorness in his heel over the weekend and feels better today with current pain of 2-3/10.  pt relates icing helped reduce his pain and did not perform his HEP on Sunday in order to rest his ankle.     Currently in Pain? Yes   Pain Score 3    Pain Location Heel   Pain Orientation Left   Pain Descriptors / Indicators Aching      Moist heat: warm-up x 5 min   Manual therapy: Soft tissue mobilization R [id number] and ischemic compression to calf to decrease tightness/tirgger points/soreness  Cross friction to achillis tenon on the insertion and lateral to calcaneous to decrease scar tissue and improve healing  Passive gastroc/soleus stretch 3x30 each in supine to increase ROM and decrease stiffness.  Iontophoresis : Stat patch with 4mg /ml on the lateral aspect of calcaneous/achilles tendon and wrapped with coban to decrease inflammation.  Skin prep and educated pt on the positive effects of iontophoresis and potential  side effects and when to remove the patch.                               PT Education - 03/07/15 1115    Education provided Yes   Education Details the benefits of soft tissue moblization and iontophoresis   Person(s) Educated Patient   Methods Explanation   Comprehension Verbalized understanding             PT Long Term Goals - 02/28/15 1401    PT LONG TERM GOAL #1   Title Pt will be able to ambulate 500 ft without boot with pain less than 2/10   Baseline ambulates in a boot   Period Weeks   PT LONG TERM GOAL #2   Title Pt will be able to score 5/5 MMT of L calf to be able to perform functional activites such step over step stair negotiation    Baseline unable to perform due to walking boot   Time 4   Status New   PT LONG TERM GOAL #3   Title Pt will improve LEFS score by at least 10 points to improve quality of life   Baseline 49/80   Time 4   Period Weeks   Status New   PT LONG TERM GOAL #4   Title pt's dorsiflexion will improve to at least 15 degrees in  order to descend steps painfree in his house   Baseline pain at 8 degrees of dorsiflexion    Time 4   Period Weeks   Status New               Plan - 03/07/15 1117    Clinical Impression Statement Pt experienced decreased pain after soft tissue moblization (effleurage and ischemic compression) left caft to decrease trigger points/tension/soreness and cross friciton to achillies tendon at the insertion and calf/soleus stretch.  concluded the session with iontophoresis stat patch to the lateral achillies/calcaneus to decrease inflammation/pain.  Pt would benefit from continued PT services to improve remaining deficits.     Pt will benefit from skilled therapeutic intervention in order to improve on the following deficits Decreased strength;Decreased mobility;Impaired flexibility;Decreased range of motion;Pain;Difficulty walking   Rehab Potential Good   PT Frequency 2x / week   PT Duration 4  weeks   PT Treatment/Interventions Aquatic Therapy;Cryotherapy;Iontophoresis 4mg /ml Dexamethasone;Therapeutic exercise;Therapeutic activities;Functional mobility training;Stair training;Neuromuscular re-education;Manual techniques;Dry needling;Passive range of motion;Scar mobilization;Balance training      Renford Dills, SPT This entire session was performed under direct supervision and direction of a licensed therapist/therapist assistant . I have personally read, edited and approve of the note as written.   Problem List There are no active problems to display for this patient. Gorden Harms. Tortorici, PT, DPT [phone]   Tortorici,Ashley, 03/07/2015, 2:00 PM  [place] MAIN [facility] SERVICES [address] [place], [place], [address] Phone: [phone]   Fax:  [phone]

## 2015-03-09 ENCOUNTER — Ambulatory Visit: Payer: 59

## 2015-03-09 DIAGNOSIS — R531 Weakness: Secondary | ICD-10-CM

## 2015-03-09 DIAGNOSIS — M25673 Stiffness of unspecified ankle, not elsewhere classified: Secondary | ICD-10-CM

## 2015-03-09 NOTE — Therapy (Signed)
[place] MAIN [facility] SERVICES [address] [place], [place], [address] Phone: [phone]   Fax:  [phone]  Physical Therapy Treatment  Patient Details  Name: [name] MRN: [id number] Date of Birth: [birth year] Referring Provider:  Samara Deist, DPM  Encounter Date: 03/09/2015      PT End of Session - 03/09/15 1048    Visit Number 4   Number of Visits 9   Date for PT Re-Evaluation 03/28/15   PT Start Time 0950   PT Stop Time 1030   PT Time Calculation (min) 40 min   Activity Tolerance Patient tolerated treatment well   Behavior During Therapy [facility] for tasks assessed/performed      Past Medical History  Diagnosis Date  . Hypertension   . Insomnia     History reviewed. No pertinent past surgical history.  There were no vitals filed for this visit.  Visit Diagnosis:  Weakness  Decreased ROM of ankle      Subjective Assessment - 03/09/15 1043    Subjective Pt reports he feels slightly better since his last session and currently as a pain rating of 1/10.  Pt relates he is looking forward to discontinuing use of boot in ~2 weeks.     Currently in Pain? Yes   Pain Score 1    Pain Location Heel   Pain Orientation Left      Warm-up: moist heat to left calf x 5 min  Manual therapy: Soft tissue mobilization (effeurage, ischemic compression) to calf to decrease tightness and soreness Cross friction to Achillis tendon distally in the calcaneous insertion to decrease pain and improve healing Passive gastroc/soelus stretches with muscle energy technique on the last repetition 3x30 sec to  Decrease tightness and improve ankle dorsiflexion.  Dry needling performed by staff physical therapist certified in dry needling. Patient received dry needling therapy education and acknowledged understanding of risks and benefits of dry needling therapy prior to receiving treatment. Patient voiced understanding of treatment options and  elected to proceed with dry needling therapy.  Trigger point dry needling to lateral proximal gastroc/soleus and distal lateral gastroc/soelus to decrease myofascial trigger point.  Pt reported normal deep aching sensation.                            PT Education - 03/09/15 1047    Education provided Yes   Education Details The benefits and procedure of dry needling   Person(s) Educated Patient   Methods Explanation   Comprehension Verbalized understanding             PT Long Term Goals - 02/28/15 1401    PT LONG TERM GOAL #1   Title Pt will be able to ambulate 500 ft without boot with pain less than 2/10   Baseline ambulates in a boot   Period Weeks   PT LONG TERM GOAL #2   Title Pt will be able to score 5/5 MMT of L calf to be able to perform functional activites such step over step stair negotiation    Baseline unable to perform due to walking boot   Time 4   Status New   PT LONG TERM GOAL #3   Title Pt will improve LEFS score by at least 10 points to improve quality of life   Baseline 49/80   Time 4   Period Weeks   Status New   PT LONG TERM GOAL #4   Title  pt's dorsiflexion will improve to at least 15 degrees in order to descend steps painfree in his house   Baseline pain at 8 degrees of dorsiflexion    Time 4   Period Weeks   Status New               Plan - 03/09/15 1051    Clinical Impression Statement pt experienced decreased tension in his left calf after soft tissue moblization (effeurage ischemic compression), dry needling in two areas (proximal lateral gastroc/soleus and distal latral gastroc soleus) and calf/gastroc stretch with contract relax technique.  Pt would benefit from continued skilled PT services to improve remaining deficits.     Pt will benefit from skilled therapeutic intervention in order to improve on the following deficits Decreased strength;Decreased mobility;Impaired flexibility;Decreased range of  motion;Pain;Difficulty walking   Rehab Potential Good   PT Frequency 2x / week   PT Duration 4 weeks   PT Treatment/Interventions Aquatic Therapy;Cryotherapy;Iontophoresis 4mg /ml Dexamethasone;Therapeutic exercise;Therapeutic activities;Functional mobility training;Stair training;Neuromuscular re-education;Manual techniques;Dry needling;Passive range of motion;Scar mobilization;Balance training        Problem List There are no active problems to display for this patient.   Renford Dills, SPT 03/09/2015, 11:06 AM  This entire session was performed under direct supervision and direction of a licensed therapist/therapist assistant . I have personally read, edited and approve of the note as written.  Gorden Harms. Tortorici, PT, DPT [phone]  [place] MAIN [facility] SERVICES [address] [place], [place], [address] Phone: [phone]   Fax:  [phone]

## 2015-03-14 ENCOUNTER — Ambulatory Visit: Payer: 59

## 2015-03-14 DIAGNOSIS — M25673 Stiffness of unspecified ankle, not elsewhere classified: Secondary | ICD-10-CM

## 2015-03-14 DIAGNOSIS — R531 Weakness: Secondary | ICD-10-CM

## 2015-03-14 NOTE — Therapy (Addendum)
[place] MAIN [facility] SERVICES [address] [place], [place], [address] Phone: [phone]   Fax:  [phone]  Physical Therapy Treatment  Patient Details  Name: [name] MRN: [id number] Date of Birth: Apr 06, 1976 Referring Provider:  Samara Deist, DPM  Encounter Date: 03/14/2015      PT End of Session - 03/14/15 1106    Visit Number 5   Number of Visits 9   Date for PT Re-Evaluation 03/28/15   PT Start Time 0948   PT Stop Time 1030   PT Time Calculation (min) 42 min   Activity Tolerance Patient tolerated treatment well   Behavior During Therapy [facility] for tasks assessed/performed      Past Medical History  Diagnosis Date  . Hypertension   . Insomnia     History reviewed. No pertinent past surgical history.  There were no vitals filed for this visit.  Visit Diagnosis:  Weakness  Decreased ROM of ankle      Subjective Assessment - 03/14/15 1105    Subjective Pt relates he feels better today and currently has no pain.  pt reports he feels like his heel/achillies is healing well.    Currently in Pain? No/denies   Pain Score 0-No pain         Warm-up: moist heat to left calf x 5 min  Manual therapy: Soft tissue mobilization (effeurage, ischemic compression and muscle stripping) to calf to decrease tightness and soreness Cross friction to Achillis tendon distally in the calcaneous insertion to decrease pain and improve healing Passive gastroc/soelus stretches with muscle energy technique on the last repetition 3x30 sec to Decrease tightness and improve ankle dorsiflexion.  Patient received dry needling therapy education and acknowledged understanding of risks and benefits of dry needling therapy prior to receiving treatment. Patient voiced understanding of treatment options and elected to proceed with dry needling therapy.   Trigger point Dry needling to lateral proximal gastroc/soleus and distal lateral gastroc/soelus  to decrease myofascial trigger point.- LTR elicited in both areas with normal assoicated reports of deep aching, this was followed again by brief muscle stripping massage and gastroc/soleus stretching       Iontophoresis : Stat patch with 4mg /ml dexamethasone on the lateral aspect of calcaneous/achilles tendon and wrapped with coban to decrease inflammation. Skin prep and educated pt on the positive effects of iontophoresis and potential side effects and when to remove the patch.                               PT Education - 03/14/15 1106    Education provided Yes   Education Details to continue performing HEP and remove iontophoresis stat patch in 6 hours   Person(s) Educated Patient   Methods Explanation   Comprehension Verbalized understanding             PT Long Term Goals - 02/28/15 1401    PT LONG TERM GOAL #1   Title Pt will be able to ambulate 500 ft without boot with pain less than 2/10   Baseline ambulates in a boot   Period Weeks   PT LONG TERM GOAL #2   Title Pt will be able to score 5/5 MMT of L calf to be able to perform functional activites such step over step stair negotiation    Baseline unable to perform due to walking boot   Time 4   Status New   PT LONG TERM  GOAL #3   Title Pt will improve LEFS score by at least 10 points to improve quality of life   Baseline 49/80   Time 4   Period Weeks   Status New   PT LONG TERM GOAL #4   Title pt's dorsiflexion will improve to at least 15 degrees in order to descend steps painfree in his house   Baseline pain at 8 degrees of dorsiflexion    Time 4   Period Weeks   Status New               Plan - 03/14/15 1115    Clinical Impression Statement Pt experienced decreased no pain with active dorsiflexion after soft tissue moblization (effleurage, muscle stripping andischemic compression), dry needling to two trigger points in calf to left caft and contract relax stretch to gastroc  and soleus.  concluded the session with iontophoresis stat patch to the lateral achillies/calcaneus to decrease inflammation/pain. Pt would benefit from continued PT services to improve remaining deficits.    Pt will benefit from skilled therapeutic intervention in order to improve on the following deficits Decreased strength;Decreased mobility;Impaired flexibility;Decreased range of motion;Pain;Difficulty walking   Rehab Potential Good   PT Frequency 2x / week   PT Duration 4 weeks   PT Treatment/Interventions Aquatic Therapy;Cryotherapy;Iontophoresis 4mg /ml Dexamethasone;Therapeutic exercise;Therapeutic activities;Functional mobility training;Stair training;Neuromuscular re-education;Manual techniques;Dry needling;Passive range of motion;Scar mobilization;Balance training      Renford Dills, SPT  Problem List There are no active problems to display for this patient. This entire session was performed under direct supervision and direction of a licensed therapist/therapist assistant . I have personally read, edited and approve of the note as written.  Gorden Harms. Tortorici, PT, DPT [phone]  Tortorici,Ashley, SPT 03/14/2015, 12:49 PM  [place] MAIN [facility] SERVICES [address] [place], [place], [address] Phone: [phone]   Fax:  [phone]

## 2015-03-16 ENCOUNTER — Ambulatory Visit: Payer: 59

## 2015-03-16 DIAGNOSIS — R531 Weakness: Secondary | ICD-10-CM | POA: Diagnosis not present

## 2015-03-16 DIAGNOSIS — M25673 Stiffness of unspecified ankle, not elsewhere classified: Secondary | ICD-10-CM

## 2015-03-16 NOTE — Therapy (Signed)
[place] MAIN [facility] SERVICES [address] [place], [place], [address] Phone: [phone]   Fax:  [phone]  Physical Therapy Treatment  Patient Details  Name: [name] MRN: [id number] Date of Birth: December 13, 1975 Referring Provider:  Samara Deist, DPM  Encounter Date: 03/16/2015      PT End of Session - 03/16/15 1110    Visit Number 6   Number of Visits 9   Date for PT Re-Evaluation 03/28/15   PT Start Time 0953   PT Stop Time 1032   PT Time Calculation (min) 39 min   Activity Tolerance Patient tolerated treatment well   Behavior During Therapy [facility] for tasks assessed/performed      Past Medical History  Diagnosis Date  . Hypertension   . Insomnia     History reviewed. No pertinent past surgical history.  There were no vitals filed for this visit.  Visit Diagnosis:  Weakness  Decreased ROM of ankle      Subjective Assessment - 03/16/15 1108    Subjective pt relates he feels well and feels like the iontophoresis is really helping.     Currently in Pain? No/denies   Pain Score 0-No pain            Moist Heat x5 min (warm-up)  Manual therapy: Soft tissue mobilization (efflurage, muscle stripping, ischemic compression) to left calf Cross friction massage to achillies tendon Dry needling to two trigger points in his calf performed by DPT Contract-relax gastroc and soleus stretch 3 minutes each  Iontophoresis: Stat patch with 4mg /ml dexamethasone on the lateral aspect of calcaneous/achilles tendon and wrapped with coban to decrease inflammation. Skin prep and educated pt on the positive effects of iontophoresis and potential side effects and when to remove the patch.                        PT Education - 03/16/15 1109    Education provided Yes   Education Details how eccentric heel planter flexion combined is helping aligning fibers in his achllies    Person(s) Educated Patient    Methods Explanation   Comprehension Verbalized understanding             PT Long Term Goals - 02/28/15 1401    PT LONG TERM GOAL #1   Title Pt will be able to ambulate 500 ft without boot with pain less than 2/10   Baseline ambulates in a boot   Period Weeks   PT LONG TERM GOAL #2   Title Pt will be able to score 5/5 MMT of L calf to be able to perform functional activites such step over step stair negotiation    Baseline unable to perform due to walking boot   Time 4   Status New   PT LONG TERM GOAL #3   Title Pt will improve LEFS score by at least 10 points to improve quality of life   Baseline 49/80   Time 4   Period Weeks   Status New   PT LONG TERM GOAL #4   Title pt's dorsiflexion will improve to at least 15 degrees in order to descend steps painfree in his house   Baseline pain at 8 degrees of dorsiflexion    Time 4   Period [place] - 03/16/15 1211    Clinical Impression Statement pt is experiencing decreased  tightness/soreness post manual therapy and continues to experience decreased pain in his heel also with iontophoresis.  pt would benefit from continued skilled PT services to make further gains.     Pt will benefit from skilled therapeutic intervention in order to improve on the following deficits Decreased strength;Decreased mobility;Impaired flexibility;Decreased range of motion;Pain;Difficulty walking   Rehab Potential Good   PT Frequency 2x / week   PT Duration 4 weeks   PT Treatment/Interventions Aquatic Therapy;Cryotherapy;Iontophoresis 4mg /ml Dexamethasone;Therapeutic exercise;Therapeutic activities;Functional mobility training;Stair training;Neuromuscular re-education;Manual techniques;Dry needling;Passive range of motion;Scar mobilization;Balance training        Problem List There are no active problems to display for this patient.   Renford Dills, SPT 03/16/2015, 12:14 PM This entire session was performed  under direct supervision and direction of a licensed therapist/therapist assistant . I have personally read, edited and approve of the note as written. Gorden Harms. Tortorici, PT, DPT [phone]  [place] MAIN [facility] SERVICES [address] [place], [place], [address] Phone: [phone]   Fax:  [phone]

## 2015-03-21 ENCOUNTER — Ambulatory Visit: Payer: 59 | Attending: Podiatry

## 2015-03-21 DIAGNOSIS — R531 Weakness: Secondary | ICD-10-CM | POA: Insufficient documentation

## 2015-03-21 DIAGNOSIS — M25673 Stiffness of unspecified ankle, not elsewhere classified: Secondary | ICD-10-CM

## 2015-03-21 NOTE — Therapy (Signed)
[place] MAIN [facility] SERVICES [address] [place], [place], [address] Phone: [phone]   Fax:  [phone]  Physical Therapy Treatment  Patient Details  Name: [name] MRN: [id number] Date of Birth: 1976-09-14 Referring Provider:  Samara Deist, DPM  Encounter Date: 03/21/2015      PT End of Session - 03/21/15 1001    Visit Number 7   Number of Visits 9   Date for PT Re-Evaluation 03/28/15   PT Start Time 0808   PT Stop Time 0850   PT Time Calculation (min) 42 min   Activity Tolerance Patient tolerated treatment well   Behavior During Therapy [facility] for tasks assessed/performed      Past Medical History  Diagnosis Date  . Hypertension   . Insomnia     No past surgical history on file.  There were no vitals filed for this visit.  Visit Diagnosis:  Weakness  Decreased ROM of ankle      Subjective Assessment - 03/21/15 0956    Subjective pt relates he currently has no pain and feels like his heel is healing well.    Currently in Pain? No/denies   Pain Score 0-No pain      Moist Heat x5 min (warm-up)  Manual therapy: Soft tissue mobilization (efflurage, muscle stripping, ischemic compression) to left calf Cross friction massage to achillies tendon Patient received dry needling therapy education and acknowledged understanding of risks and benefits of dry needling therapy prior to receiving treatment. Patient voiced understanding of treatment options and elected to proceed with dry needling therapy.   Dry needling to two trigger points in his calf performed by DPT: No LTR elicited today, soft tissue changes appreciated during treatment. Pt reported normal deep aching sensation,.  Contract-relax gastroc and soleus stretch 2 minutes each  Iontophoresis: Stat patch with 4mg /ml dexamethasone on the lateral aspect of calcaneous/achilles tendon and wrapped with coban to decrease inflammation. Skin prep and educated pt on  the positive effects of iontophoresis and potential side effects and when to remove the patch.                             PT Education - 03/21/15 [phone]    Education provided Yes   Education Details we will progress treatment depending on what his MD relates in his follow-up tomorrow.    Person(s) Educated Patient   Methods Explanation   Comprehension Verbalized understanding             PT Long Term Goals - 02/28/15 1401    PT LONG TERM GOAL #1   Title Pt will be able to ambulate 500 ft without boot with pain less than 2/10   Baseline ambulates in a boot   Period Weeks   PT LONG TERM GOAL #2   Title Pt will be able to score 5/5 MMT of L calf to be able to perform functional activites such step over step stair negotiation    Baseline unable to perform due to walking boot   Time 4   Status New   PT LONG TERM GOAL #3   Title Pt will improve LEFS score by at least 10 points to improve quality of life   Baseline 49/80   Time 4   Period Weeks   Status New   PT LONG TERM GOAL #4   Title pt's dorsiflexion will improve to at least 15 degrees in order to descend steps  painfree in his house   Baseline pain at 8 degrees of dorsiflexion    Time 4   Period Weeks   Status New               Plan - 03/21/15 1002    Clinical Impression Statement pt is progressing well with PT.  Post manual therapy, left akle dorsiflexion ROM is 14 degrees in sitting and 40 degrees of plantarflexion.  pt would benefit from continued skilled PT services to improve remaining deficits.    Pt will benefit from skilled therapeutic intervention in order to improve on the following deficits Decreased strength;Decreased mobility;Impaired flexibility;Decreased range of motion;Pain;Difficulty walking   Rehab Potential Good   PT Frequency 2x / week   PT Duration 4 weeks   PT Treatment/Interventions Aquatic Therapy;Cryotherapy;Iontophoresis 4mg /ml Dexamethasone;Therapeutic  exercise;Therapeutic activities;Functional mobility training;Stair training;Neuromuscular re-education;Manual techniques;Dry needling;Passive range of motion;Scar mobilization;Balance training        Problem List There are no active problems to display for this patient.  [place], [place] 03/21/2015, 10:12 AM This entire session was performed under direct supervision and direction of a licensed therapist/therapist assistant . I have personally read, edited and approve of the note as written. Gorden Harms. Tortorici, PT, DPT [phone]  [place] MAIN [facility] SERVICES [address] [place], [place], [address] Phone: [phone]   Fax:  [phone]

## 2015-03-23 ENCOUNTER — Ambulatory Visit: Payer: 59

## 2015-03-23 DIAGNOSIS — R531 Weakness: Secondary | ICD-10-CM

## 2015-03-23 DIAGNOSIS — M25673 Stiffness of unspecified ankle, not elsewhere classified: Secondary | ICD-10-CM

## 2015-03-23 NOTE — Patient Instructions (Signed)
[email or web address] Ankle alphabet x1 Supine SLR 2x10 sidelying hip abduction 2x10 Prone hip extension 2x10

## 2015-03-23 NOTE — Therapy (Signed)
[place] MAIN [facility] SERVICES [address] [place], [place], [address] Phone: [phone]   Fax:  [phone]  Physical Therapy Treatment  Patient Details  Name: [name] MRN: [id number] Date of Birth: 01-Feb-1976 Referring Provider:  Samara Deist, DPM  Encounter Date: 03/23/2015      PT End of Session - 03/23/15 1226    Visit Number 8   Number of Visits 9   Date for PT Re-Evaluation 03/28/15   PT Start Time 0800   PT Stop Time 0855   PT Time Calculation (min) 55 min   Equipment Utilized During Treatment Gait belt   Activity Tolerance Patient tolerated treatment well   Behavior During Therapy [facility] for tasks assessed/performed      Past Medical History  Diagnosis Date  . Hypertension   . Insomnia     History reviewed. No pertinent past surgical history.  There were no vitals filed for this visit.  Visit Diagnosis:  Weakness  Decreased ROM of ankle      Subjective Assessment - 03/23/15 1220    Subjective pt relates he is doing well this morning and has about 1/10 pain today.  He relates his heel is sore from the palpation his MD did yesterday.  pt reports his MD wants him to use the boot for another 2 weeks and that he is happy with the progress thus far.    Currently in Pain? Yes   Pain Score 1    Pain Location Heel   Pain Orientation Left   Pain Descriptors / Indicators Sore          Moist Heat x5 min (warm-up)  Manual therapy: Soft tissue mobilization (efflurage, muscle stripping, ischemic compression) to left calf Cross friction massage to achillies tendon Dry needling to two trigger points in his calf performed by DPT Contract-relax gastroc and soleus stretch 2 minutes each  Iontophoresis: Stat patch with 4mg /ml dexamethasone on the lateral aspect of calcaneous/achilles tendon and wrapped with coban to decrease inflammation. Skin prep and educated pt on the positive effects of iontophoresis and potential  side effects and when to remove the patch.   There ex: Eccentric plantarflexion with red band 2x10, pt required verbal cueing to perform activity slower with 10 second count Left ankle alphabet x1 Left hip SLR 2x10 sidelying hip abduction 2x10 Prone hip extension 2x10 Pt required verbal cueing and tactile cueing for correct technique.                         PT Education - 03/23/15 1224    Education provided Yes   Education Details perform new hip HEP exercises to increase LE strength to decreaes stress on LE when boot is discontinued   Person(s) Educated Patient   Methods Explanation   Comprehension Verbalized understanding;Returned demonstration             PT Long Term Goals - 02/28/15 1401    PT LONG TERM GOAL #1   Title Pt will be able to ambulate 500 ft without boot with pain less than 2/10   Baseline ambulates in a boot   Period Weeks   PT LONG TERM GOAL #2   Title Pt will be able to score 5/5 MMT of L calf to be able to perform functional activites such step over step stair negotiation    Baseline unable to perform due to walking boot   Time 4   Status New   PT  LONG TERM GOAL #3   Title Pt will improve LEFS score by at least 10 points to improve quality of life   Baseline 49/80   Time 4   Period Weeks   Status New   PT LONG TERM GOAL #4   Title pt's dorsiflexion will improve to at least 15 degrees in order to descend steps painfree in his house   Baseline pain at 8 degrees of dorsiflexion    Time 4   Period Weeks   Status New               Plan - 03/23/15 1227    Clinical Impression Statement pt did not experience an increase pain with strength progression to red band and hip ther ex.  pt presents with decreased tigger point/tension in his calf post manual therapy and has 12 degrees of ankle dorsiflexion in long sitting.  pt would benefit from continued skilled PT services to imprve strength and remaining deficits.    Pt will  benefit from skilled therapeutic intervention in order to improve on the following deficits Decreased strength;Decreased mobility;Impaired flexibility;Decreased range of motion;Pain;Difficulty walking   Rehab Potential Good   PT Frequency 2x / week   PT Duration 4 weeks   PT Treatment/Interventions Aquatic Therapy;Cryotherapy;Iontophoresis 4mg /ml Dexamethasone;Therapeutic exercise;Therapeutic activities;Functional mobility training;Stair training;Neuromuscular re-education;Manual techniques;Dry needling;Passive range of motion;Scar mobilization;Balance training   PT Next Visit Plan increase LE ther ex and begin functional ther act. REASSESS NEXT VISIT         Problem List There are no active problems to display for this patient.  Renford Dills, SPT Renford Dills 03/23/2015, 12:36 PM This entire session was performed under direct supervision and direction of a licensed therapist/therapist assistant . I have personally read, edited and approve of the note as written. Gorden Harms. Tortorici, PT, DPT [phone]  [place] MAIN [facility] SERVICES [address] [place], [place], [address] Phone: [phone]   Fax:  [phone]

## 2015-03-28 ENCOUNTER — Ambulatory Visit: Payer: 59

## 2015-03-28 DIAGNOSIS — R531 Weakness: Secondary | ICD-10-CM | POA: Diagnosis not present

## 2015-03-28 DIAGNOSIS — M25673 Stiffness of unspecified ankle, not elsewhere classified: Secondary | ICD-10-CM

## 2015-03-28 NOTE — Patient Instructions (Signed)
[email or web address] Standing gastroc and soleus stretch 3x30 sec each.

## 2015-03-28 NOTE — Therapy (Signed)
[place] MAIN [facility] SERVICES [address] [place], [place], [address] Phone: [phone]   Fax:  [phone]  Physical Therapy Treatment  Patient Details  Name: [name] MRN: [id number] Date of Birth: [birth year] Referring Provider:  Samara Deist, DPM  Encounter Date: 03/28/2015      PT End of Session - 03/28/15 0955    Visit Number 9   Number of Visits 17   Date for PT Re-Evaluation 04/25/15   PT Start Time 0805   PT Stop Time 0848   PT Time Calculation (min) 43 min   Equipment Utilized During Treatment Gait belt   Activity Tolerance Patient tolerated treatment well   Behavior During Therapy [facility] for tasks assessed/performed      Past Medical History  Diagnosis Date  . Hypertension   . Insomnia     History reviewed. No pertinent past surgical history.  There were no vitals filed for this visit.  Visit Diagnosis:  Weakness  Decreased ROM of ankle      Subjective Assessment - 03/28/15 0950    Subjective pt relates he over did it this weekend during while moving things out of his old home to his new home and while mowing his yard in the new home.  pt reports he wore his boot while ascending/descending ~ 20 flights of stairs and did not wear his boot while mowing the lawn.  pt  relates was most severe Sunday night with 5-6/10 and it is currently 2-3/10.     Pain Score 3    Pain Location Heel   Pain Orientation Left          Moist Heat x3 min (warm-up): no charge Iontophoresis: Stat patch with 36m/ml dexamethasone on the lateral aspect of calcaneous/achilles tendon and wrapped with coban to decrease inflammation. Skin prep and educated pt on the positive effects of iontophoresis and potential side effects and when to remove the patch.   There ex: Eccentric plantarflexion with red band 4x10, pt required verbal cueing to perform activity slower with 10 second count sidelying hip abduction 2x10 Prone hip extension  2x10 Ankle inversion with red band x10 Ankle eversion with red band x10 Ankle dorsiflexion with red band x10 Leg press with 75#, 3x10, pt required verbal cues to push through heels and to perform at slower pace Standing gastroc/soleus stretch 3x30 sec each, pt required min verbal and tactile cues for correct form Standing weight shifts 2x20 sec Mini squats on airex 2x10, pt required verbal cues on proper form Single leg stand on airex 3x10 sec   Left Ankle ROM: Long sitting: 14 degrees dorsiflexion/ 40 degrees plantarflexion Knees bent: 16 degrees of dorsiflexion/ 50 degrees of plantar flexion  LEFS improved to 52/80                       PT Education - 03/28/15 0955    Education provided Yes   Education Details peform standing stretches after completing HEP   Person(s) Educated Patient   Methods Explanation   Comprehension Verbalized understanding             PT Long Term Goals - 03/28/15 1028    PT LONG TERM GOAL #1   Title Pt will be able to ambulate 500 ft without boot with pain less than 2/10   Baseline ambulates in a boot   Period Weeks   Status On-going   PT LONG TERM GOAL #2   Title Pt will be  able to score 5/5 MMT of L calf to be able to perform functional activites such step over step stair negotiation    Baseline unable to perform due to walking boot   Time 4   Status On-going   PT LONG TERM GOAL #3   Title Pt will improve LEFS score by at least 10 points to improve quality of life   Baseline 52/80   Time 4   Period Weeks   Status Partially Met   PT LONG TERM GOAL #4   Title pt's dorsiflexion will improve to at least 15 degrees in order to descend steps painfree in his house   Baseline 14 degrees of dorsiflexion in long sitting   Time 4   Period Weeks   Status Partially Met               Plan - 03/28/15 0956    Clinical Impression Statement pt is progressing well with PT and has started performing activities without boot  during PT.  pt is able to complete therapy session without boot with no increase in pain.  pt's left ankle active dorsiflexion in long sitting is 14 degrees and 40 degrees of plantar flexion and 16 degrees of active dorsiflexion with knee bent and 50 degrees of plantarflexion.  pt has partially met some goals and others are ongoing, pt would benefit from skilled PT services to improve remaining deficits and make further gains with his goals.     Pt will benefit from skilled therapeutic intervention in order to improve on the following deficits Decreased strength;Decreased mobility;Impaired flexibility;Decreased range of motion;Pain;Difficulty walking   Rehab Potential Good   PT Frequency 2x / week   PT Duration 4 weeks   PT Treatment/Interventions Aquatic Therapy;Cryotherapy;Iontophoresis 54m/ml Dexamethasone;Therapeutic exercise;Therapeutic activities;Functional mobility training;Stair training;Neuromuscular re-education;Manual techniques;Dry needling;Passive range of motion;Scar mobilization;Balance training   PT Next Visit Plan progress LE standing strength exercises        Problem List There are no active problems to display for this patient.  JRenford Dills SPT JRenford Dills7/08/2015, 10:31 AM This entire session was performed under direct supervision and direction of a licensed therapist/therapist assistant . I have personally read, edited and approve of the note as written. AGorden Harms Tortorici, PT, DPT #[phone] [place] [facility]SERV[facility] [address] [place] [address]Phone: [phone]  Fax:  [phone]

## 2015-03-30 ENCOUNTER — Ambulatory Visit: Payer: 59

## 2015-03-30 DIAGNOSIS — R531 Weakness: Secondary | ICD-10-CM

## 2015-03-30 DIAGNOSIS — M25673 Stiffness of unspecified ankle, not elsewhere classified: Secondary | ICD-10-CM

## 2015-03-30 NOTE — Patient Instructions (Signed)
[email or web address] Ankle 4 way with red or yellow band 2x10

## 2015-03-30 NOTE — Therapy (Signed)
[place] MAIN [facility] SERVICES [address] [place], [place], [address] Phone: [phone]   Fax:  [phone]  Physical Therapy Treatment  Patient Details  Name: [name] MRN: [id number] Date of Birth: Dec 01, 1975 Referring Provider:  Samara Deist, DPM  Encounter Date: 03/30/2015      PT End of Session - 03/30/15 1035    Visit Number 10   Number of Visits 17   Date for PT Re-Evaluation 04/25/15   PT Start Time 0917   PT Stop Time 1000   PT Time Calculation (min) 43 min   Equipment Utilized During Treatment Gait belt   Activity Tolerance Patient tolerated treatment well   Behavior During Therapy [facility] for tasks assessed/performed      Past Medical History  Diagnosis Date  . Hypertension   . Insomnia     History reviewed. No pertinent past surgical history.  There were no vitals filed for this visit.  Visit Diagnosis:  Weakness  Decreased ROM of ankle      Subjective Assessment - 03/30/15 1025    Subjective pt reports he was sore after his last session but it was less than what he experienced over the weekend.  pt currently has 1-2/10 pain.    Currently in Pain? Yes   Pain Score 2    Pain Location Heel   Pain Orientation Left           There ex: Ankle 4(dorsifleixon/plantarflexion/eversion/inversion) with red band 2x10, pt required verbal cueing for correct technique  Leg press with 75# 1x10 pt required verbal cues to push through heels and to perform at slower pace Left leg press with 75# 2x10 Balance board static balance 2x10 sec Balance board side to side 2x10 Balance board forward/backwards 2x10sec Pt required verbal cueing on correct technique during balance board activities  Standing on airex and stepping onto 4 inch step 2x10,  Bilateral Standing hip abduction/extension with red band 2x10, pt required verbal cueing to perform exercise with toes pointing forward, pt supinates while performing this  exercise                       PT Education - 03/30/15 1028    Education provided Yes   Education Details to ice after HEP   Person(s) Educated Patient   Methods Explanation   Comprehension Verbalized understanding;Returned demonstration             PT Long Term Goals - 03/28/15 1028    PT LONG TERM GOAL #1   Title Pt will be able to ambulate 500 ft without boot with pain less than 2/10   Baseline ambulates in a boot   Period Weeks   Status On-going   PT LONG TERM GOAL #2   Title Pt will be able to score 5/5 MMT of L calf to be able to perform functional activites such step over step stair negotiation    Baseline unable to perform due to walking boot   Time 4   Status On-going   PT LONG TERM GOAL #3   Title Pt will improve LEFS score by at least 10 points to improve quality of life   Baseline 52/80   Time 4   Period Weeks   Status Partially Met   PT LONG TERM GOAL #4   Title pt's dorsiflexion will improve to at least 15 degrees in order to descend steps painfree in his house   Baseline 14 degrees of dorsiflexion in  long sitting   Time 4   Period Weeks   Status Partially Met               Plan - 03/30/15 1036    Clinical Impression Statement pt tolerated progression of weight bearing exercises, balance exercises with no increase in pain.  pt stands and performs most exercises with weight >on the lateral aspect of his left foot.  pt would benefit from skilled PT services to improve remaining deeficits.    Pt will benefit from skilled therapeutic intervention in order to improve on the following deficits Decreased strength;Decreased mobility;Impaired flexibility;Decreased range of motion;Pain;Difficulty walking   Rehab Potential Good   PT Frequency 2x / week   PT Duration 4 weeks   PT Treatment/Interventions Aquatic Therapy;Cryotherapy;Iontophoresis 41m/ml Dexamethasone;Therapeutic exercise;Therapeutic activities;Functional mobility  training;Stair training;Neuromuscular re-education;Manual techniques;Dry needling;Passive range of motion;Scar mobilization;Balance training   PT Next Visit Plan assess foot         Problem List There are no active problems to display for this patient.  JRenford Dills SPT Tortorici,[place] 03/30/2015, 1:15 PM This entire session was performed under direct supervision and direction of a licensed therapist/therapist assistant . I have personally read, edited and approve of the note as written. AGorden Harms Tortorici, PT, DPT #[id number] [place]MAIN RSsm [facility] CenterSERVICES [address] [address]Phone: [phone]  Fax:  [phone]

## 2015-04-04 ENCOUNTER — Ambulatory Visit: Payer: 59

## 2015-04-04 DIAGNOSIS — M25673 Stiffness of unspecified ankle, not elsewhere classified: Secondary | ICD-10-CM

## 2015-04-04 DIAGNOSIS — R531 Weakness: Secondary | ICD-10-CM | POA: Diagnosis not present

## 2015-04-04 NOTE — Therapy (Signed)
[place] MAIN [facility] SERVICES [address] [place], [place], [address] Phone: [phone]   Fax:  [phone]  Physical Therapy Treatment  Patient Details  Name: [name] MRN: [id number] Date of Birth: [birth year] Referring Provider:  Samara Deist, DPM  Encounter Date: 04/04/2015      PT End of Session - 04/04/15 1026    Visit Number 11   Number of Visits 17   Date for PT Re-Evaluation 04/25/15   PT Start Time 0800   PT Stop Time 0845   PT Time Calculation (min) 45 min   Equipment Utilized During Treatment Gait belt   Activity Tolerance Patient tolerated treatment well   Behavior During Therapy [facility] for tasks assessed/performed      Past Medical History  Diagnosis Date  . Hypertension   . Insomnia     History reviewed. No pertinent past surgical history.  There were no vitals filed for this visit.  Visit Diagnosis:  Weakness  Decreased ROM of ankle      Subjective Assessment - 04/04/15 1021    Subjective pt reports he slipped on Friday on wet surface but did not fall.  his right knee pain is 4/10 and left heel pain is 1-2/10. pt relates he is excited to start weaning off the boot on wednesday (7/20)   Currently in Pain? Yes   Pain Score 2    Pain Location Heel   Pain Orientation Left         there ex: Nustep: x5 min (no charge) Bilateral leg press with 90# 2x10 Left leg press with 90 # 2x10 with heel raise  Bilateral hip abduction/extension with red band 2x10 Left Gastroc/soleus stretch 3x30 sec Pt required min verbal cueing for correct technique with the above exercises   Neuro re-ed Single leg stance on airex 8x10 sec  Side stepping on airex matt x4 lengths of matt Forward stepping on airex matt with 2 sec hold during single leg stance x 4 lengths of matt Single leg stance on left LE 4x10 sec Pt required verbal cueing to keep foot flat during activities versus supinating and weightbearing on lateral  aspect of foot  Left and Right subtalor ROM Eversion: 3 degrees Inversion: 15 degrees                          PT Education - 04/04/15 1025    Education provided Yes   Education Details start weaning off the boot by wearing it half the day and no high intensity activites    Person(s) Educated Patient   Methods Explanation   Comprehension Verbalized understanding             PT Long Term Goals - 03/28/15 1028    PT LONG TERM GOAL #1   Title Pt will be able to ambulate 500 ft without boot with pain less than 2/10   Baseline ambulates in a boot   Period Weeks   Status On-going   PT LONG TERM GOAL #2   Title Pt will be able to score 5/5 MMT of L calf to be able to perform functional activites such step over step stair negotiation    Baseline unable to perform due to walking boot   Time 4   Status On-going   PT LONG TERM GOAL #3   Title Pt will improve LEFS score by at least 10 points to improve quality of life   Baseline 52/80  Time 4   Period Weeks   Status Partially Met   PT LONG TERM GOAL #4   Title pt's dorsiflexion will improve to at least 15 degrees in order to descend steps painfree in his house   Baseline 14 degrees of dorsiflexion in long sitting   Time 4   Period Weeks   Status Partially Met               Plan - 04/04/15 1027    Clinical Impression Statement pt was able to progress balance exercises with no increase in pain.  pt demonstrates less ankle stability on left versus right during neuro re-ed activities, pt tends supinate more on his left.  pt's stabiliity improved with verbal cueing to keep foot flat.  pt would benef from skilled PT servies to improve balance, strength, ROM, decrease pain and improve functional activity tolerance    Pt will benefit from skilled therapeutic intervention in order to improve on the following deficits Decreased strength;Decreased mobility;Impaired flexibility;Decreased range of  motion;Pain;Difficulty walking   Rehab Potential Good   PT Frequency 2x / week   PT Duration 4 weeks   PT Treatment/Interventions Aquatic Therapy;Cryotherapy;Iontophoresis 68m/ml Dexamethasone;Therapeutic exercise;Therapeutic activities;Functional mobility training;Stair training;Neuromuscular re-education;Manual techniques;Dry needling;Passive range of motion;Scar mobilization;Balance training   PT Next Visit Plan possible wedge to on left LE to decrease excess supination in single leg stance         Problem List There are no active problems to display for this patient.  JRenford Dills SPT JRenford Dills7/19/2016, 10:40 AM This entire session was performed under direct supervision and direction of a licensed therapist/therapist assistant . I have personally read, edited and approve of the note as written.  AGorden Harms Tortorici, PT, DPT #[id number] CWaianaeMAIN RSurgical [facility] Dup[facility] Medical GroupSERVICES [address]RGross N[place] [address]Phone: [phone]  Fax:  [phone]

## 2015-04-06 ENCOUNTER — Ambulatory Visit: Payer: 59

## 2015-04-06 DIAGNOSIS — R531 Weakness: Secondary | ICD-10-CM | POA: Diagnosis not present

## 2015-04-06 DIAGNOSIS — M25673 Stiffness of unspecified ankle, not elsewhere classified: Secondary | ICD-10-CM

## 2015-04-06 NOTE — Therapy (Signed)
[place] MAIN [facility] SERVICES [address] [place], [place], [address] Phone: [phone]   Fax:  [phone]  Physical Therapy Treatment  Patient Details  Name: [name] MRN: [id number] Date of Birth: [birth year] Referring Provider:  Samara Deist, DPM  Encounter Date: 04/06/2015      PT End of Session - 04/06/15 1040    Visit Number 12   Number of Visits 17   Date for PT Re-Evaluation 04/25/15   PT Start Time 0802   PT Stop Time 0846   PT Time Calculation (min) 44 min   Equipment Utilized During Treatment Gait belt   Activity Tolerance Patient tolerated treatment well   Behavior During Therapy [facility] for tasks assessed/performed      Past Medical History  Diagnosis Date  . Hypertension   . Insomnia     History reviewed. No pertinent past surgical history.  There were no vitals filed for this visit.  Visit Diagnosis:  Weakness  Decreased ROM of ankle      Subjective Assessment - 04/06/15 1054    Subjective pt reports he started weaning off the boot yesterday and that so far it has gone well.  pt relates he has 1/10 pain and his calf feels "tired"   Currently in Pain? Yes   Pain Score 1    Pain Location Heel   Pain Orientation Left                manual therapy: Soft tissue mobilization (effleurage, ischemic compression, muscle stripping) to left calf to decrease tightness and pain  Therex: nustep x5 min: no charge Eccentric heel raise on left with 90# 3x10, pt required verbal cueing to plantar flex with his right and lower with left with 10 second count Step ups on 4 inch steep 3x10 bilaterally  gastroc and soleus stretch 4x30 seconds each Bilateral single leg stance on level ground and airex 4x10 sec each Pt required min verbal cueing on proper form and supervision  L ankle dorsiflexion in log sitting: 12 degrees                  PT Education - 04/06/15 1055    Education provided Yes    Education Details he can ice at work if possible to decrease pain   Person(s) Educated Patient   Methods Explanation   Comprehension Verbalized understanding             PT Long Term Goals - 03/28/15 1028    PT LONG TERM GOAL #1   Title Pt will be able to ambulate 500 ft without boot with pain less than 2/10   Baseline ambulates in a boot   Period Weeks   Status On-going   PT LONG TERM GOAL #2   Title Pt will be able to score 5/5 MMT of L calf to be able to perform functional activites such step over step stair negotiation    Baseline unable to perform due to walking boot   Time 4   Status On-going   PT LONG TERM GOAL #3   Title Pt will improve LEFS score by at least 10 points to improve quality of life   Baseline 52/80   Time 4   Period Weeks   Status Partially Met   PT LONG TERM GOAL #4   Title pt's dorsiflexion will improve to at least 15 degrees in order to descend steps painfree in his house   Baseline 14 degrees of dorsiflexion in  long sitting   Time 4   Period Weeks   Status Partially Met               Plan - 04/06/15 1041    Clinical Impression Statement pt presents with decreased ankle dorsiflexion from previous measurment in long sitting and soft tissue mobilization to the calf to decrease tighness and soreness.  pt did not experience an increase in left calf pain with progression of strength exercises.  pt would benefit from skilled PT services to improve ROM, decrease pain, increase strength and functional mobility.    Pt will benefit from skilled therapeutic intervention in order to improve on the following deficits Decreased strength;Decreased mobility;Impaired flexibility;Decreased range of motion;Pain;Difficulty walking   Rehab Potential Good   PT Frequency 2x / week   PT Duration 4 weeks   PT Treatment/Interventions Aquatic Therapy;Cryotherapy;Iontophoresis 33m/ml Dexamethasone;Therapeutic exercise;Therapeutic activities;Functional mobility  training;Stair training;Neuromuscular re-education;Manual techniques;Dry needling;Passive range of motion;Scar mobilization;Balance training   PT Next Visit Plan possible wedge to on left LE to decrease excess supination in single leg stance         Problem List There are no active problems to display for this patient.   JRenford Dills SPT This entire session was performed under direct supervision and direction of a licensed tChiropractor. I have personally read, edited and approve of the note as written. AGorden Harms Tortorici, PT, DPT #[id number] Tortorici,Ashley 04/06/2015, 1:14 PM  CParsonsMAIN RBerkshire Medical Center - HiLLCrest CampusSERVICES [address] [address]Phone: [phone]  Fax:  [phone]

## 2015-04-11 ENCOUNTER — Ambulatory Visit: Payer: 59

## 2015-04-11 DIAGNOSIS — R531 Weakness: Secondary | ICD-10-CM

## 2015-04-11 DIAGNOSIS — M25673 Stiffness of unspecified ankle, not elsewhere classified: Secondary | ICD-10-CM

## 2015-04-11 NOTE — Therapy (Signed)
[place] MAIN [facility] SERVICES [address] [place], [place], [address] Phone: [phone]   Fax:  [phone]  Physical Therapy Treatment  Patient Details  Name: [name] MRN: [id number] Date of Birth: February 16, 1976 Referring Provider:  Samara Deist, DPM  Encounter Date: 04/11/2015      PT End of Session - 04/11/15 1234    Visit Number 13   Number of Visits 17   Date for PT Re-Evaluation 04/25/15   PT Start Time 0815   PT Stop Time 0845   PT Time Calculation (min) 30 min   Equipment Utilized During Treatment Gait belt   Activity Tolerance Patient tolerated treatment well   Behavior During Therapy [facility] for tasks assessed/performed      Past Medical History  Diagnosis Date  . Hypertension   . Insomnia     History reviewed. No pertinent past surgical history.  There were no vitals filed for this visit.  Visit Diagnosis:  Weakness  Decreased ROM of ankle      Subjective Assessment - 04/11/15 1232    Subjective pt reports he has stopped using walking boot as of Sunday (7/24) and has been doing well. pt relates pain on the lateral aspect of his left foot which is exacerbated with eversion.  pt relates that started the end of last week and has been doing his HEP with yellow band to decrease stress. pt relates it slowely improving.    Pain Score 1    Pain Location Foot   Pain Orientation Left     pt arrived 15 min late     Therex: nustep x3 min: no charge Eccentric heel raise on left with 120# 3x10, pt required verbal cueing to plantar flex with his right and lower with left with 10 second count Leg press with 90# x10 Hip flexion/abduction/extension with red band x10 each gastroc and soleus stretch 4x30 seconds each Mini lunges on airex, x 10 each  Pt required min verbal cueing on proper form and supervision                               PT Long Term Goals - 03/28/15 1028    PT LONG TERM  GOAL #1   Title Pt will be able to ambulate 500 ft without boot with pain less than 2/10   Baseline ambulates in a boot   Period Weeks   Status On-going   PT LONG TERM GOAL #2   Title Pt will be able to score 5/5 MMT of L calf to be able to perform functional activites such step over step stair negotiation    Baseline unable to perform due to walking boot   Time 4   Status On-going   PT LONG TERM GOAL #3   Title Pt will improve LEFS score by at least 10 points to improve quality of life   Baseline 52/80   Time 4   Period Weeks   Status Partially Met   PT LONG TERM GOAL #4   Title pt's dorsiflexion will improve to at least 15 degrees in order to descend steps painfree in his house   Baseline 14 degrees of dorsiflexion in long sitting   Time 4   Period Weeks   Status Partially Met               Plan - 04/11/15 1235    Clinical Impression Statement pt experienced minimal increase  in pain with strength progression exercises and fatigue in his left calf.  pt left calf is thight and stretching helps decrease tightness.  pt demonstrates decreased ankle stability during airex exercises and during single leg stance.  pt would benefit from continued skilled PT services to improve deficits   Pt will benefit from skilled therapeutic intervention in order to improve on the following deficits Decreased strength;Decreased mobility;Impaired flexibility;Decreased range of motion;Pain;Difficulty walking   Rehab Potential Good   PT Frequency 2x / week   PT Duration 4 weeks   PT Treatment/Interventions Aquatic Therapy;Cryotherapy;Iontophoresis 75m/ml Dexamethasone;Therapeutic exercise;Therapeutic activities;Functional mobility training;Stair training;Neuromuscular re-education;Manual techniques;Dry needling;Passive range of motion;Scar mobilization;Balance training   PT Next Visit Plan progress HEP        Problem List There are no active problems to display for this patient.  JRenford Dills SPT This entire session was performed under direct supervision and direction of a licensed tChiropractor. I have personally read, edited and approve of the note as written. AGorden Harms Tortorici, PT, DPT #[id number] Tortorici,Ashley 04/11/2015, 2:41 PM  CLaroseMAIN RHhc [facility] LLCSERVICES [address] [place] [address]Phone: [phone]  Fax:  [phone]

## 2015-04-13 ENCOUNTER — Ambulatory Visit: Payer: 59

## 2015-04-13 DIAGNOSIS — M25673 Stiffness of unspecified ankle, not elsewhere classified: Secondary | ICD-10-CM

## 2015-04-13 DIAGNOSIS — R531 Weakness: Secondary | ICD-10-CM | POA: Diagnosis not present

## 2015-04-13 NOTE — Therapy (Signed)
[place] MAIN [facility] SERVICES [address] [place], [place], [address] Phone: [phone]   Fax:  [phone]  Physical Therapy Treatment  Patient Details  Name: [name] MRN: [id number] Date of Birth: Apr 20, 1976 Referring Provider:  Samara Deist, DPM  Encounter Date: 04/13/2015      PT End of Session - 04/13/15 0850    Visit Number 14   Number of Visits 17   Date for PT Re-Evaluation 04/25/15   PT Start Time 0804   PT Stop Time 0845   PT Time Calculation (min) 41 min   Equipment Utilized During Treatment Gait belt   Activity Tolerance Patient tolerated treatment well   Behavior During Therapy [facility] for tasks assessed/performed      Past Medical History  Diagnosis Date  . Hypertension   . Insomnia     History reviewed. No pertinent past surgical history.  There were no vitals filed for this visit.  Visit Diagnosis:  Weakness  Decreased ROM of ankle      Subjective Assessment - 04/13/15 0848    Subjective pt reports he has some lateral foot soreness after last visit. pt reports his posterior ankle is a little sore, but no worse than normal.    Pertinent History history of L LE plantar fasciitis   Limitations Standing;Walking   Patient Stated Goals To reduce pain and discontinue using the boot.    Currently in Pain? Yes   Pain Score 1    Pain Location --  posterior ankle   Pain Onset More than a month ago      Gait analysis on treadmill x 3 min 4 way hip - blue band x 10 each way Single leg press with dyna disc (L side) 100lbs 2x15 Eccentric heel raises on leg press 100lbs 3x10 Side steps with band around toes- red band 28f x 2 laps Side step downs- 6inches 2x10 bilaterally- cues for form and knee control SLS on AIREX with ball toss 3x10 each leg SLS cone touch ouside BOS (3 cones) x 4 min Side to side cone touch 2x10 each side- cues for weight shift Calf stretching x 2 min  Pt was challenged with execises,  he reports hip/quad fatigue L>R, demonstrates bilateral ankle instability, but L>R  no increased pain with exercises                         PT Education - 04/13/15 0849    Education provided Yes   Education Details discussed shoe-weardue to excessive eversion             PT Long Term Goals - 03/28/15 1028    PT LONG TERM GOAL #1   Title Pt will be able to ambulate 500 ft without boot with pain less than 2/10   Baseline ambulates in a boot   Period Weeks   Status On-going   PT LONG TERM GOAL #2   Title Pt will be able to score 5/5 MMT of L calf to be able to perform functional activites such step over step stair negotiation    Baseline unable to perform due to walking boot   Time 4   Status On-going   PT LONG TERM GOAL #3   Title Pt will improve LEFS score by at least 10 points to improve quality of life   Baseline 52/80   Time 4   Period Weeks   Status Partially Met   PT LONG TERM GOAL #  4   Title pt's dorsiflexion will improve to at least 15 degrees in order to descend steps painfree in his house   Baseline 14 degrees of dorsiflexion in long sitting   Time 4   Period Weeks   Status Partially Met               Plan - 04/13/15 0850    Clinical Impression Statement PT assessed L calf tone, which was not abnormal. pt does have some tenderness along the fibularis tendons and insertion. gait analysis reveals pt does land on the lateral aspect of his L foot vs. on the R. pt also tends to stand like this. discussed more stable shoe wear with  pt.  progressed therex today to further strengthen hips, ankle everters, and ankle stability in general.    Pt will benefit from skilled therapeutic intervention in order to improve on the following deficits Decreased strength;Decreased mobility;Impaired flexibility;Decreased range of motion;Pain;Difficulty walking   Rehab Potential Good   PT Frequency 2x / week   PT Duration 4 weeks   PT  Treatment/Interventions Aquatic Therapy;Cryotherapy;Iontophoresis 68m/ml Dexamethasone;Therapeutic exercise;Therapeutic activities;Functional mobility training;Stair training;Neuromuscular re-education;Manual techniques;Dry needling;Passive range of motion;Scar mobilization;Balance training   PT Next Visit Plan jog on trampoline        Problem List There are no active problems to display for this patient.  AGorden Harms Taytum Wheller, PT, DPT #[id number] Zsofia Prout 04/13/2015, 8:56 AM  CRockwellMAIN RLincoln Surgery Center LLCSERVICES [address] [address]Phone: [phone]  Fax:  [phone]

## 2015-04-18 ENCOUNTER — Ambulatory Visit: Payer: 59 | Attending: Podiatry

## 2015-04-18 DIAGNOSIS — R29898 Other symptoms and signs involving the musculoskeletal system: Secondary | ICD-10-CM | POA: Diagnosis present

## 2015-04-18 DIAGNOSIS — R531 Weakness: Secondary | ICD-10-CM | POA: Insufficient documentation

## 2015-04-18 DIAGNOSIS — M25673 Stiffness of unspecified ankle, not elsewhere classified: Secondary | ICD-10-CM

## 2015-04-18 NOTE — Therapy (Addendum)
[place] MAIN [facility] SERVICES [address] [place], [place], [address] Phone: [phone]   Fax:  [phone]  Physical Therapy Treatment  Patient Details  Name: [name] MRN: [id number] Date of Birth: 04-17-1976 Referring Provider:  Samara Deist, DPM  Encounter Date: 04/18/2015      PT End of Session - 04/18/15 0900    Visit Number 15   Number of Visits 17   Date for PT Re-Evaluation 04/25/15   PT Start Time 0801   PT Stop Time 0850   PT Time Calculation (min) 49 min   Equipment Utilized During Treatment Gait belt   Activity Tolerance Patient tolerated treatment well   Behavior During Therapy [facility] for tasks assessed/performed      Past Medical History  Diagnosis Date  . Hypertension   . Insomnia     History reviewed. No pertinent past surgical history.  There were no vitals filed for this visit.  Visit Diagnosis:  Weakness  Decreased ROM of ankle      Subjective Assessment - 04/18/15 0857    Subjective /pt relates his posterior ankle has been sore since Friday (7/29) and has stayed constant.  pt reports icing numbness the soreness/pain but otherwise no change.     Pertinent History history of L LE plantar fasciitis   Limitations Standing;Walking   Patient Stated Goals To reduce pain and discontinue using the boot.    Currently in Pain? Yes   Pain Score 1    Pain Location Ankle   Pain Orientation Left   Pain Descriptors / Indicators Sore   Pain Onset More than a month ago      therex: bilateral hip flexion/abduction/extension - blue band x 10 each way Bilateral single leg press with 5 plates 2x10 Eccentric heel raises on leg press 5 plates 2x10 Side steps with band around toes- red band 55f x 3 laps Side step ups/ downs- 6inches (4 inch step/2 inch airex) 1x10 bilaterally, pt required verbal cues on hip and knee control SLS on AIREX with mini squat with ball toss 1x10 each leg Squats on dyna disc 2x5 Mini  squats on dyna disc 2x5 Calf stretching x 2 min  Pt reports increased bilateral instability with fatigue  left ankle dorsiflexion active sitting ROM: 16 Left ankle dorsiflexion active in log sitting: 14                          PT Education - 04/18/15 0900    Education provided Yes   Education Details benefit of decreasing knee pain with strengthening hips   Person(s) Educated Patient   Methods Explanation   Comprehension Verbalized understanding             PT Long Term Goals - 03/28/15 1028    PT LONG TERM GOAL #1   Title Pt will be able to ambulate 500 ft without boot with pain less than 2/10   Baseline ambulates in a boot   Period Weeks   Status On-going   PT LONG TERM GOAL #2   Title Pt will be able to score 5/5 MMT of L calf to be able to perform functional activites such step over step stair negotiation    Baseline unable to perform due to walking boot   Time 4   Status On-going   PT LONG TERM GOAL #3   Title Pt will improve LEFS score by at least 10 points to improve quality of  life   Baseline 52/80   Time 4   Period Weeks   Status Partially Met   PT LONG TERM GOAL #4   Title pt's dorsiflexion will improve to at least 15 degrees in order to descend steps painfree in his house   Baseline 14 degrees of dorsiflexion in long sitting   Time 4   Period Weeks   Status Partially Met               Plan - 04/18/15 0901    Clinical Impression Statement pt's ankle dorsiflexion active range of motion in sitting and long sitting was within range of previous measurement, thus pt's c/o of tightness was not affecting his ROM.  pt was able to complete session with no increase in pain and experienced decrease tighness in posterior ankle following dyno disc exercise.  pt presents with improved ankle stability on stable surface following acitvities on unstable surfaces.  pt would benefit from skilled PT services to improve deficits   Pt will benefit  from skilled therapeutic intervention in order to improve on the following deficits Decreased strength;Decreased mobility;Impaired flexibility;Decreased range of motion;Pain;Difficulty walking   Rehab Potential Good   PT Frequency 2x / week   PT Duration 4 weeks   PT Treatment/Interventions Aquatic Therapy;Cryotherapy;Iontophoresis 2m/ml Dexamethasone;Therapeutic exercise;Therapeutic activities;Functional mobility training;Stair training;Neuromuscular re-education;Manual techniques;Dry needling;Passive range of motion;Scar mobilization;Balance training   PT Next Visit Plan jog on trampoline        Problem List There are no active problems to display for this patient.  JRenford Dills SPT This entire session was performed under direct supervision and direction of a licensed tChiropractor. I have personally read, edited and approve of the note as written. AGorden Harms Tortorici, PT, DPT #[id number] Tortorici,Ashley 04/18/2015, 3:49 PM  CBonney LakeMAIN RChesterton Surgery Center LLCSERVICES [address] [address]Phone: [phone]  Fax:  [phone]

## 2015-04-20 ENCOUNTER — Ambulatory Visit: Payer: 59

## 2015-04-20 DIAGNOSIS — R531 Weakness: Secondary | ICD-10-CM | POA: Diagnosis not present

## 2015-04-20 DIAGNOSIS — M25673 Stiffness of unspecified ankle, not elsewhere classified: Secondary | ICD-10-CM

## 2015-04-20 NOTE — Therapy (Signed)
[facility] MA[facility] REH[facility] SERVICES [address] [place], [place], [address] Phone: [phone]   Fax:  [phone]  Physical Therapy Treatment  Patient Details  Name: [name] MRN: [id number] Date of Birth: 03/29/1976 Referring Provider:  Fowler, Justin, DPM  Encounter Date: 04/20/2015      PT End of Session - 04/20/15 1147    Visit Number 16   Number of Visits 17   Date for PT Re-Evaluation 04/25/15   PT Start Time 0800   PT Stop Time 0845   PT Time Calculation (min) 45 min   Equipment Utilized During Treatment Gait belt   Activity Tolerance Patient tolerated treatment well   Behavior During Therapy WFL for tasks assessed/performed      Past Medical History  Diagnosis Date  . Hypertension   . Insomnia     History reviewed. No pertinent past surgical history.  There were no vitals filed for this visit.  Visit Diagnosis:  Weakness  Decreased ROM of ankle      Subjective Assessment - 04/20/15 1145    Subjective pt relates his calf/posterior anlke feels less tight since his last session and has 1/10 pain at the start of session.     Pertinent History history of L LE plantar fasciitis   Limitations Standing;Walking   Patient Stated Goals To reduce pain and discontinue using the boot.    Pain Score 1    Pain Location Heel   Pain Orientation Left   Pain Onset More than a month ago      therex: 6 min walk test: 1730 ft with 1.5/10  increase in pain  Bilateral single leg press with 6 plates 2x10 Eccentric heel raises on leg press 6 plates 2x10 Side steps with band around toes- red band 14ft x 3 laps Side step ups/ downs- 8inches (6 inch step +2 inch airex) 2x10 bilaterally, pt required verbal cues on hip and knee control Side stepping + squats along airex beam x 3 laps Mini squats on airex x10 Calf stretching x 1 min  Pt reports increased bilateral instability with  fatigue                                 PT Long Term Goals - 03/28/15 1028    PT LONG TERM GOAL #1   Title Pt will be able to ambulate 500 ft without boot with pain less than 2/10   Baseline ambulates in a boot   Period Weeks   Status On-going   PT LONG TERM GOAL #2   Title Pt will be able to score 5/5 MMT of L calf to be able to perform functional activites such step over step stair negotiation    Baseline unable to perform due to walking boot   Time 4   Status On-going   PT LONG TERM GOAL #3   Title Pt will improve LEFS score by at least 10 points to improve quality of life   Baseline 52/80   Time 4   Period Weeks   Status Partially Met   PT LONG TERM GOAL #4   Title pt's dorsiflexion will improve to at least 15 degrees in order to descend steps painfree in his house   Baseline 14 degrees of dorsiflexion in long sitting   Time 4   Period Weeks   Status Partially Met                 Plan - 04/20/15 1147    Clinical Impression Statement pt was able to complete exercises with minimal increase in pain and without rest breaks.  pt demonstrates decreased ankle stability during single leg stance phase of exercises.  pt did experience slight increase in pain during 6 min walk test but did not affet his gait.     Pt will benefit from skilled therapeutic intervention in order to improve on the following deficits Decreased strength;Decreased mobility;Impaired flexibility;Decreased range of motion;Pain;Difficulty walking   Rehab Potential Good   PT Frequency 2x / week   PT Duration 4 weeks   PT Treatment/Interventions Aquatic Therapy;Cryotherapy;Iontophoresis 4mg/ml Dexamethasone;Therapeutic exercise;Therapeutic activities;Functional mobility training;Stair training;Neuromuscular re-education;Manual techniques;Dry needling;Passive range of motion;Scar mobilization;Balance training   PT Next Visit Plan progress note         Problem List There are no  active problems to display for this patient.  Jorge Canuto, SPT This entire session was performed under direct supervision and direction of a licensed therapist/therapist assistant . I have personally read, edited and approve of the note as written. Ashley C. Tortorici, PT, DPT #[id number] ' Tortorici,Ashley 04/20/2015, 5:12 PM  [facility] [address] [place], [place], [address] Phone: [phone]   Fax:  [phone]      

## 2015-04-25 ENCOUNTER — Ambulatory Visit: Payer: 59

## 2015-04-25 DIAGNOSIS — R531 Weakness: Secondary | ICD-10-CM | POA: Diagnosis not present

## 2015-04-25 DIAGNOSIS — M25673 Stiffness of unspecified ankle, not elsewhere classified: Secondary | ICD-10-CM

## 2015-04-25 NOTE — Therapy (Addendum)
[place] [facility] SERVICES [address] [place], [place], [address] Phone: [phone]   Fax:  [phone]  Physical Therapy Treatment/Discharge note July 12th-August 9th 2016  Patient Details  Name: [name] MRN: [id number] Date of Birth: 06-11-1976 Referring Provider:  Clide Cliff, MD  Encounter Date: 04/25/2015      PT End of Session - 04/25/15 1124    Visit Number 17   Number of Visits 17   Date for PT Re-Evaluation 04/25/15   PT Start Time 0805   PT Stop Time 0845   PT Time Calculation (min) 40 min   Equipment Utilized During Treatment Gait belt   Activity Tolerance Patient tolerated treatment well   Behavior During Therapy [facility] for tasks assessed/performed      Past Medical History  Diagnosis Date  . Hypertension   . Insomnia     History reviewed. No pertinent past surgical history.  There were no vitals filed for this visit.  Visit Diagnosis:  Weakness  Decreased ROM of ankle      Subjective Assessment - 04/25/15 1119    Subjective pt relates he is doing well and has 1/10 currently. pt reports he is able to do everything at home without any trouble and notes greates difficit is walking distance. pt reports he used to walk ~ 6 miles at work and currenlty is able to walk ~2-3 miles but otherise is doing all his activites at work.     Pertinent History history of L LE plantar fasciitis   Limitations Standing;Walking   Patient Stated Goals To reduce pain and discontinue using the boot.    Currently in Pain? Yes   Pain Score 1    Pain Location Heel   Pain Orientation Left   Pain Onset More than a month ago     SPT assessed outcome measures and progress towards goals Therex: 6 min walk test: 1855 ft gastroc stretch: 2x30 sec Soleus stretch: 2x30 sec Ascend/descend 8 steps with no UE assist and step over step pattern Eccentric left heel raise on step x10 and required verbal cueing for correct  technique Squat and pick up object from floor x2 L ankle dorsiflexion AROM: In sitting: 16 degrees In long sitting: 18 degrees                            PT Education - 04/25/15 1123    Education provided Yes   Education Details plan of care, return to running program,   Person(s) Educated Patient   Methods Explanation   Comprehension Verbalized understanding             PT Long Term Goals - 04/25/15 1124    PT LONG TERM GOAL #1   Title Pt will be able to ambulate 500 ft without boot with pain less than 2/10   Baseline able to ambulate 1850 ft in 6 minutes with 1/10 pain    Period Weeks   Status Achieved   PT LONG TERM GOAL #2   Title Pt will be able to score 5/5 MMT of L calf to be able to perform functional activites such step over step stair negotiation    Baseline pt scored 5/5 MMT of left plantar flexors and able to perform stair negotiation with step over step pattern   Time 4   Status Achieved   PT LONG TERM GOAL #3   Title Pt will improve LEFS score by at  least 10 points to improve quality of life   Baseline pt scored 59/80   Time 4   Period Weeks   Status Partially Met   PT LONG TERM GOAL #4   Title pt's dorsiflexion will improve to at least 15 degrees in order to descend steps painfree in his house   Baseline pt achieved 16 degrees of dorsiflexion in long sitting and 18 degrees with left knee bent   Time 4   Period Weeks   Status Achieved               Plan - 04/25/15 1127    Clinical Impression Statement pt has progressed well with PT and has met all his goals and partially met LEFS score improvement goal.  pt is able to perform all functional activites with less than 2/10 pain in his heel and ambulates with even recipriocol gait pattern.  pt is discharged from PT services at this time due to meeting his goals and discharged with HEP and return to running program.     Pt will benefit from skilled therapeutic intervention in  order to improve on the following deficits Decreased strength;Decreased mobility;Impaired flexibility;Decreased range of motion;Pain;Difficulty walking   Rehab Potential Good   PT Frequency 2x / week   PT Duration 4 weeks   PT Treatment/Interventions Aquatic Therapy;Cryotherapy;Iontophoresis 93m/ml Dexamethasone;Therapeutic exercise;Therapeutic activities;Functional mobility training;Stair training;Neuromuscular re-education;Manual techniques;Dry needling;Passive range of motion;Scar mobilization;Balance training   PT Next Visit Plan progress note         Problem List There are no active problems to display for this patient.  JRenford Dills SPT This entire session was performed under direct supervision and direction of a licensed tChiropractor. I have personally read, edited and approve of the note as written. AGorden Harms Tortorici, PT, DPT ##[id number] JRenford Dills8/05/2015, 11:31 AM  [facility] [address] [address]Phone: [phone]  Fax:  [phone]

## 2015-04-27 ENCOUNTER — Ambulatory Visit: Payer: 59

## 2015-05-15 ENCOUNTER — Telehealth: Payer: Self-pay

## 2015-05-15 NOTE — Telephone Encounter (Signed)
Called patient and let him know the only record we have from is [facility] 04/2013. Pt will need to contact [facility] for past immunizations.

## 2015-05-15 NOTE — Telephone Encounter (Signed)
Patient is going overseas and is requesting his shot records. He is aware we may not have all of them but I told him we may be able to search the outside records in [place]. He thinks he had all of his shots transferred. I advised we do not have [facility] and he said he just wants to check and see what we do have. If you can not find any of them I would ask him if he knows what ones he has to have and check with Dr. Luan Pulling. There also is a good chance he will still have to go to [facility] for the out of country vaccines like Milaria. I would see what you can find and then give him the number to  [facility] [phone]. Tx  Theresia Majors CMA

## 2015-05-26 ENCOUNTER — Ambulatory Visit (INDEPENDENT_AMBULATORY_CARE_PROVIDER_SITE_OTHER): Payer: 59 | Admitting: Internal Medicine

## 2015-05-26 DIAGNOSIS — Z789 Other specified health status: Secondary | ICD-10-CM | POA: Diagnosis not present

## 2015-05-26 DIAGNOSIS — Z7189 Other specified counseling: Secondary | ICD-10-CM

## 2015-05-26 DIAGNOSIS — Z23 Encounter for immunization: Secondary | ICD-10-CM | POA: Diagnosis not present

## 2015-05-26 DIAGNOSIS — Z7184 Encounter for health counseling related to travel: Secondary | ICD-10-CM

## 2015-05-26 MED ORDER — ATOVAQUONE-PROGUANIL HCL 250-100 MG PO TABS: 1.0000 | ORAL_TABLET | Freq: Every day | ORAL | Status: DC

## 2015-05-26 MED ORDER — CIPROFLOXACIN HCL 500 MG PO TABS: 500.0000 mg | ORAL_TABLET | Freq: Two times a day (BID) | ORAL | Status: DC

## 2015-05-26 NOTE — Patient Instructions (Signed)
[facility]                [address] [place]                   [place], [place] [address]                      Phone: [phone]                        Fax: [phone]   Planned departure date: July 13, 2015          Planned return date: 2 weeks Countries of travel: [place]   Guidelines for the Prevention & Treatment of Traveler's Diarrhea  Prevention: "Boil it, Peel it, Lacinda Axon it, or Forget it"   the fewer chances -> lower risk: try to stick to food & water precautions as much as possible"   If it's "piping hot"; it is probably okay, if not, it may not be   Treatment   1) You should always take care to drink lots of fluids in order to avoid dehydration   2) You should bring medications with you in case you come down with a case of diarrhea   3) OTC = bring pepto-bismol - can take with initial abdominal symptoms;                    Imodium - can help slow down your intestinal tract, can help relief cramps                    and diarrhea, can take if no bloody diarrhea  Use ciprofloxacin if needed for traveler's diarrhea  Guidelines for the Prevention of Malaria  Avoidance:  -fewer mosquito bites = lower risk. Mosquitos can bite at night as well as daytime  -cover up (long sleeve clothing), mosquito nets, screens  -Insect repellent for your skin ( DEET containing lotion > 20%): for clothes ( permethrin spray)   2 days prior to travel to [facility], start B[place], daily dose starting 1-2 days before entering endemic area, ending 7 days after leaving area for malaria prevention.   Immunizations received today: Hepatitis A series and Typhoid (parenteral)  Future immunizations, if indicated Hepatitis A series 6 months (around March 9th, 2017)   Prior to travel:  1) Be sure to pick up appropriate prescriptions, including medicine you take daily. Do not expect to be able to fill your prescriptions  abroad.  2) Strongly consider obtaining traveler's insurance, including emergency evacuation insurance. Most plans in the [name] do not cover participants abroad. (see below for resources)  3) Register at the appropriate [place] embassy or consulate with travel dates so they are aware of your presence in-country and for helpful advice during travel using the [employer] (STEP, GreenNylon.com.cy).  4) Leave contact information with a relative or friend.  5) Keep a Research officer, political party, credit cards in case they become lost or stolen  6) Inform your credit card company that you will be travelling abroad   During travel:  1) If you become ill and need medical advice, the [place] [employer]	[place] of the country you are traveling in general provides a list of [place] speaking doctors.  We are also available on MyChart for remote consultation if you register prior to travel. 2) Avoid motorcycles or scooters when at all  possible. Traffic laws in many countries are lax and accidents occur frequently.  3) Do not take any unnecessary risks that you wouldn't do at home.   Resources:  -Country specific information: BlindResource.ca or GreenNylon.com.cy  -Press photographer (DEET, mosquito nets): [employer], [employer] store, [employer], [place] insurance options: gatewayplans.com; http://clayton-rivera.info/; travelguard.com or Good [employer], gninsurance.com or info@gninsurance .com, H1235423.   Post Travel:  If you return from your trip ill, call your primary care doctor or our travel clinic @ [phone].   Enjoy your trip and know that with proper pre-travel preparation, most people have an enjoyable and uninterrupted trip!

## 2015-05-26 NOTE — Progress Notes (Signed)
Subjective:   [name] is a 39 y.o. male who presents to the Infectious Disease clinic for travel consultation. Planned departure date: July 13, 2015          Planned return date: 2 weeks Countries of travel: [place] Areas in country: urban   Accommodations: hotel Purpose of travel: vacation and will be on motorcycle between [place] and [place] Prior travel out of [name]: yes     Objective:   Medications: reviewed Advised to take extra supply in separate area   Assessment:    No contraindications to travel. none     Plan:    Issues discussed: environmental concerns, future shots, insect-borne illnesses, Japanese encephalitis, MVA safety, rabies, safe food/water, traveler's diarrhea, website/handouts for more information, what to do if ill upon return, what to do if ill while there and Yellow Fever. Immunizations recommended: Hepatitis A series and Typhoid (parenteral). Malaria prophylaxis: malarone, daily dose starting 1-2 days before entering endemic area, ending 7 days after leaving area Traveler's diarrhea prophylaxis: ciprofloxacin. Total duration of visit: 1 Hour. Total time spent on education, counseling, coordination of care: 30 Minutes.

## 2015-06-27 ENCOUNTER — Encounter: Payer: Self-pay | Admitting: *Deleted

## 2015-06-27 ENCOUNTER — Ambulatory Visit (INDEPENDENT_AMBULATORY_CARE_PROVIDER_SITE_OTHER): Payer: 59 | Admitting: Family Medicine

## 2015-06-27 VITALS — BP 135/80 | HR 75 | Temp 98.1°F | Resp 16 | Ht 73.0 in | Wt 273.4 lb

## 2015-06-27 DIAGNOSIS — Z23 Encounter for immunization: Secondary | ICD-10-CM | POA: Diagnosis not present

## 2015-06-27 DIAGNOSIS — N6310 Unspecified lump in the right breast, unspecified quadrant: Secondary | ICD-10-CM

## 2015-06-27 DIAGNOSIS — F329 Major depressive disorder, single episode, unspecified: Secondary | ICD-10-CM | POA: Diagnosis not present

## 2015-06-27 DIAGNOSIS — F3342 Major depressive disorder, recurrent, in full remission: Secondary | ICD-10-CM | POA: Insufficient documentation

## 2015-06-27 DIAGNOSIS — F32A Depression, unspecified: Secondary | ICD-10-CM

## 2015-06-27 DIAGNOSIS — E119 Type 2 diabetes mellitus without complications: Secondary | ICD-10-CM

## 2015-06-27 DIAGNOSIS — N63 Unspecified lump in breast: Secondary | ICD-10-CM | POA: Diagnosis not present

## 2015-06-27 DIAGNOSIS — I10 Essential (primary) hypertension: Secondary | ICD-10-CM | POA: Diagnosis not present

## 2015-06-27 NOTE — Progress Notes (Signed)
Name: [name]   MRN: [id number]    DOB: 1975-09-17   Date:06/27/2015       Progress Note  Subjective  Chief Complaint  Chief Complaint  Patient presents with  . Hypertension    Hypertension Pertinent negatives include no blurred vision, chest pain, headaches, malaise/fatigue, orthopnea, palpitations or shortness of breath.    Here for f/u of HBP.  Feeling well.  Planning trip to [place] in near future.  Er. Solum cares for his DM.  Sees Dr. Bridgett Larsson for depression No problem-specific assessment & plan notes found for this encounter.   Past Medical History  Diagnosis Date  . Hypertension   . Insomnia   . Seasonal allergies   . Diabetes ([place])   . GERD (gastroesophageal reflux disease)   . Plantar fasciitis     Social History  Substance Use Topics  . Smoking status: Never Smoker   . Smokeless tobacco: Never Used  . Alcohol Use: No     Current outpatient prescriptions:  .  atovaquone-proguanil (MALARONE) 250-100 MG TABS tablet, , Disp: , Rfl: 0 .  esomeprazole (NEXIUM) 40 MG capsule, Take 40 mg by mouth daily at 12 noon., Disp: , Rfl:  .  FLUoxetine (PROZAC) 20 MG capsule, Take 20 mg by mouth daily., Disp: , Rfl:  .  metFORMIN (GLUCOPHAGE-XR) 500 MG 24 hr tablet, Take 500 mg by mouth at bedtime. Taking 2000 at night, Disp: , Rfl: 4 .  naproxen (NAPROSYN) 375 MG tablet, Take 375 mg by mouth 2 (two) times daily with a meal., Disp: , Rfl:  .  valsartan-hydrochlorothiazide (DIOVAN-HCT) 160-12.5 MG per tablet, Take 1 tablet by mouth daily., Disp: , Rfl:   Allergies  Allergen Reactions  . Tomato Other (See Comments)    GI distress    Review of Systems  Constitutional: Negative for fever, chills, weight loss and malaise/fatigue.  HENT: Negative for hearing loss.   Eyes: Negative for blurred vision and double vision.  Respiratory: Negative for cough, sputum production, shortness of breath and wheezing.   Cardiovascular: Negative for chest pain, palpitations,  orthopnea and leg swelling.  Gastrointestinal: Negative for heartburn, abdominal pain and blood in stool.  Genitourinary: Negative for dysuria, urgency and frequency.  Musculoskeletal: Negative for myalgias.  Skin: Negative for rash.  Neurological: Negative for dizziness, weakness and headaches.  Psychiatric/Behavioral: Negative for depression.      Objective  Filed Vitals:   06/27/15 0848  BP: 132/80  Pulse: 75  Temp: 98.1 F (36.7 C)  Resp: 16  Height: 6\' 1"  (1.854 m)  Weight: 273 lb 6.4 oz (124.013 kg)     Physical Exam  Constitutional: He is oriented to person, place, and time and well-developed, well-nourished, and in no distress. No distress.  HENT:  Head: Normocephalic and atraumatic.  Eyes: Conjunctivae and EOM are normal. Pupils are equal, round, and reactive to light. No scleral icterus.  Neck: Normal range of motion. Neck supple. Carotid bruit is not present. No thyromegaly present.  Cardiovascular: Normal rate, regular rhythm, normal heart sounds and intact distal pulses.  Exam reveals no gallop and no friction rub.   No murmur heard. Pulmonary/Chest: Effort normal and breath sounds normal. No respiratory distress. He has no wheezes. He has no rales. Right breast exhibits mass. Right breast exhibits no inverted nipple, no nipple discharge, no skin change and no tenderness. Left breast exhibits no inverted nipple, no mass, no nipple discharge, no skin change and no tenderness. Breasts are symmetrical.    Abdominal:  Soft. Bowel sounds are normal. He exhibits no distension and no mass. There is no tenderness.  Musculoskeletal: Normal range of motion. He exhibits no edema.  Lymphadenopathy:    He has no cervical adenopathy.  Neurological: He is alert and oriented to person, place, and time.  Vitals reviewed.     No results found for this or any previous visit (from the past 2160 hour(s)).   Assessment & Plan  1. Need for influenza vaccination  - Flu  Vaccine QUAD 36+ mos PF IM (Fluarix & Fluzone Quad PF)  2. Essential hypertension -cont. medds  3. Type 2 diabetes mellitus without complication, without long-term current use of insulin (HCC)  - Lipid Profile - CBC with Differential - Hepatic function panel  4. Depression   5. Breast mass, right  - Ambulatory referral to General Surgery

## 2015-07-04 ENCOUNTER — Encounter: Payer: Self-pay | Admitting: General Surgery

## 2015-07-04 ENCOUNTER — Ambulatory Visit (INDEPENDENT_AMBULATORY_CARE_PROVIDER_SITE_OTHER): Payer: 59 | Admitting: General Surgery

## 2015-07-04 ENCOUNTER — Ambulatory Visit: Payer: 59

## 2015-07-04 VITALS — BP 130/74 | HR 78 | Resp 12 | Ht 73.0 in | Wt 269.0 lb

## 2015-07-04 DIAGNOSIS — N63 Unspecified lump in breast: Secondary | ICD-10-CM | POA: Diagnosis not present

## 2015-07-04 DIAGNOSIS — N6310 Unspecified lump in the right breast, unspecified quadrant: Secondary | ICD-10-CM

## 2015-07-04 NOTE — Progress Notes (Signed)
Patient ID: [name], male   DOB: 05-02-1976, 39 y.o.   MRN: [id number]  Chief Complaint  Patient presents with  . Other    right breast mass    HPI [name] is a 39 y.o. male here today for a right breast mass. He noticed this area about six weeks ago,no pain or change in sizes.   HPI  Past Medical History  Diagnosis Date  . Hypertension   . Insomnia   . Seasonal allergies   . Diabetes ([place])   . GERD (gastroesophageal reflux disease)   . Plantar fasciitis     Past Surgical History  Procedure Laterality Date  . Clavide surgery  2005  . Shoulder surgery Right 2008  . Knee surgery Right 2013    Family History  Problem Relation Age of Onset  . Cancer Mother     colon/rectal  . Cancer Brother     hodgekins  . Cancer Maternal Grandfather     lung  . Stroke Maternal Grandfather     Social History Social History  Substance Use Topics  . Smoking status: Never Smoker   . Smokeless tobacco: Never Used  . Alcohol Use: No    Allergies  Allergen Reactions  . Tomato Other (See Comments)    GI distress    Current Outpatient Prescriptions  Medication Sig Dispense Refill  . atovaquone-proguanil (MALARONE) 250-100 MG TABS tablet   0  . esomeprazole (NEXIUM) 40 MG capsule Take 40 mg by mouth daily at 12 noon.    . metFORMIN (GLUCOPHAGE-XR) 500 MG 24 hr tablet Take 500 mg by mouth at bedtime. Taking 2000 at night  4  . naproxen (NAPROSYN) 375 MG tablet Take 375 mg by mouth 2 (two) times daily with a meal.    . valsartan-hydrochlorothiazide (DIOVAN-HCT) 160-12.5 MG per tablet Take 1 tablet by mouth daily.    [name] FLUoxetine (PROZAC) 20 MG capsule Take 20 mg by mouth daily.     No current facility-administered medications for this visit.    Review of Systems Review of Systems  Constitutional: Negative.   Respiratory: Negative.   Cardiovascular: Negative.     Blood pressure 130/74, pulse 78, resp. rate 12, height 6\' 1"  (1.854 m), weight 269 lb (122.018  kg).  Physical Exam Physical Exam  Constitutional: He is oriented to person, place, and time. He appears well-developed and well-nourished.  Eyes: Conjunctivae are normal. No scleral icterus.  Neck: Neck supple.  Cardiovascular: Normal rate, regular rhythm and normal heart sounds.   Pulmonary/Chest: Effort normal and breath sounds normal. Right breast exhibits mass. Right breast exhibits no inverted nipple, no nipple discharge, no skin change and no tenderness. Left breast exhibits no inverted nipple, no mass, no nipple discharge, no skin change and no tenderness.    Lymphadenopathy:    He has no cervical adenopathy.    He has no axillary adenopathy.  Neurological: He is alert and oriented to person, place, and time.  Skin: Skin is warm and dry.    Data Reviewed  Notes reviewed  [name] targeted over palpable nodules- UIQ shows a hyperrchoic mss 0.95cm size-likely a lipoma. No findings at 10 ocl location  Assessment    Probable lipoma right breast. No need for intervention.     Plan    Patient to return in three to four months right breast check.       PCP:  Caryl Pina G 07/04/2015, 4:47 PM

## 2015-07-04 NOTE — Patient Instructions (Signed)
Patient to return in three or four months.

## 2015-09-20 DIAGNOSIS — F4320 Adjustment disorder, unspecified: Secondary | ICD-10-CM | POA: Diagnosis not present

## 2015-10-02 ENCOUNTER — Ambulatory Visit: Payer: Self-pay | Admitting: General Surgery

## 2015-10-02 ENCOUNTER — Ambulatory Visit (INDEPENDENT_AMBULATORY_CARE_PROVIDER_SITE_OTHER): Payer: 59 | Admitting: General Surgery

## 2015-10-02 ENCOUNTER — Encounter: Payer: Self-pay | Admitting: General Surgery

## 2015-10-02 VITALS — BP 128/68 | HR 74 | Resp 12 | Ht 73.0 in | Wt 265.0 lb

## 2015-10-02 DIAGNOSIS — N63 Unspecified lump in breast: Secondary | ICD-10-CM

## 2015-10-02 DIAGNOSIS — N6310 Unspecified lump in the right breast, unspecified quadrant: Secondary | ICD-10-CM

## 2015-10-02 NOTE — Progress Notes (Signed)
Patient ID: [name], male   DOB: [birth year], 40 y.o.   MRN: [id number]  Chief Complaint  Patient presents with  . Follow-up    right breast mass    HPI [name] is a 40 y.o. male here following up for a right breast mass. Last time he was here ultrasound showed a hyperechoic mss 0.95cm size-likely a lipoma in the right breast. He states he has not noticed any change in size in the last few months. He does report that the area is a little tender with palpation. He denies any breast discharge.  I have reviewed the history of present illness with the patient. HPI  Past Medical History  Diagnosis Date  . Hypertension   . Insomnia   . Seasonal allergies   . Diabetes ([place])   . GERD (gastroesophageal reflux disease)   . Plantar fasciitis     Past Surgical History  Procedure Laterality Date  . Clavide surgery  2005  . Shoulder surgery Right 2008  . Knee surgery Right 2013    Family History  Problem Relation Age of Onset  . Cancer Mother     colon/rectal  . Cancer Brother     hodgekins  . Cancer Maternal Grandfather     lung  . Stroke Maternal Grandfather     Social History Social History  Substance Use Topics  . Smoking status: Never Smoker   . Smokeless tobacco: Never Used  . Alcohol Use: No    Allergies  Allergen Reactions  . Tomato Other (See Comments)    GI distress    Current Outpatient Prescriptions  Medication Sig Dispense Refill  . atovaquone-proguanil (MALARONE) 250-100 MG TABS tablet   0  . esomeprazole (NEXIUM) 40 MG capsule Take 40 mg by mouth daily at 12 noon.    . etodolac (LODINE) 500 MG tablet Take 500 mg by mouth daily.    [name] FLUoxetine (PROZAC) 20 MG capsule Take 20 mg by mouth daily.    . metFORMIN (GLUCOPHAGE-XR) 500 MG 24 hr tablet Take 500 mg by mouth at bedtime. Taking 2000 at night  4  . valsartan-hydrochlorothiazide (DIOVAN-HCT) 160-12.5 MG per tablet Take 1 tablet by mouth daily.     No current facility-administered  medications for this visit.    Review of Systems Review of Systems  Constitutional: Negative.   Respiratory: Negative.   Cardiovascular: Negative.     Blood pressure 128/68, pulse 74, resp. rate 12, height 6\' 1"  (1.854 m), weight 265 lb (120.203 kg).  Physical Exam Physical Exam  Constitutional: He is oriented to person, place, and time. He appears well-developed and well-nourished.  Eyes: Conjunctivae are normal. No scleral icterus.  Neck: Neck supple.  Pulmonary/Chest: Right breast exhibits mass. Right breast exhibits no inverted nipple, no nipple discharge, no skin change and no tenderness. Left breast exhibits no inverted nipple, no mass, no nipple discharge, no skin change and no tenderness.    Lymphadenopathy:    He has no cervical adenopathy.    He has no axillary adenopathy.  Neurological: He is alert and oriented to person, place, and time.    Data Reviewed Prior notes  Assessment    Right breast masses, unchanged and benign by clinical exam and prior US.     Plan    Follow up as needed. Patient to have PCP assess area on his scheduled visits     PCP: Theodosia Quay 10/02/2015, 10:32 AM

## 2015-10-02 NOTE — Patient Instructions (Addendum)
Follow up as needed. Call for any increase in size, pain or discomfort with the mass in right breast area

## 2015-10-10 ENCOUNTER — Ambulatory Visit (INDEPENDENT_AMBULATORY_CARE_PROVIDER_SITE_OTHER): Payer: 59 | Admitting: Family Medicine

## 2015-10-10 ENCOUNTER — Encounter: Payer: Self-pay | Admitting: Family Medicine

## 2015-10-10 VITALS — BP 124/84 | HR 102 | Temp 100.6°F | Resp 16 | Ht 73.0 in | Wt 260.8 lb

## 2015-10-10 DIAGNOSIS — R509 Fever, unspecified: Secondary | ICD-10-CM | POA: Diagnosis not present

## 2015-10-10 DIAGNOSIS — J029 Acute pharyngitis, unspecified: Secondary | ICD-10-CM

## 2015-10-10 LAB — POCT RAPID STREP A (OFFICE): RAPID STREP A SCREEN: NEGATIVE

## 2015-10-10 MED ORDER — PENICILLIN V POTASSIUM 500 MG PO TABS: 500.0000 mg | ORAL_TABLET | Freq: Three times a day (TID) | ORAL | Status: AC

## 2015-10-10 MED ORDER — ACETAMINOPHEN-CODEINE #3 300-30 MG PO TABS: 1.0000 | ORAL_TABLET | Freq: Four times a day (QID) | ORAL | Status: DC | PRN

## 2015-10-10 NOTE — Patient Instructions (Signed)
Use warm saline gargles and throat lozenges as needed.

## 2015-10-10 NOTE — Progress Notes (Signed)
Name: [name]   MRN: [id number]    DOB: [birth year]   Date:10/10/2015       Progress Note  Subjective  Chief Complaint  Chief Complaint  Patient presents with  . Fever    ST, Cough, Fever 3 days.Denies B    HPI C/o sore throat and fever x 3 days.  Sore throat getting much worse past 24 hrs.  Fever every day.  Some mild cough.  Nothing in chest.  Minimal nasal congestion.  He had flu shot this year.  No problem-specific assessment & plan notes found for this encounter.   Past Medical History  Diagnosis Date  . Hypertension   . Insomnia   . Seasonal allergies   . Diabetes ([place])   . GERD (gastroesophageal reflux disease)   . Plantar fasciitis     Social History  Substance Use Topics  . Smoking status: Never Smoker   . Smokeless tobacco: Never Used  . Alcohol Use: No     Current outpatient prescriptions:  .  atovaquone-proguanil (MALARONE) 250-100 MG TABS tablet, , Disp: , Rfl: 0 .  esomeprazole (NEXIUM) 40 MG capsule, Take 40 mg by mouth daily at 12 noon., Disp: , Rfl:  .  FLUoxetine (PROZAC) 20 MG capsule, Take 20 mg by mouth daily., Disp: , Rfl:  .  ibuprofen (ADVIL,MOTRIN) 200 MG tablet, Take 200 mg by mouth every 6 (six) hours as needed., Disp: , Rfl:  .  metFORMIN (GLUCOPHAGE-XR) 500 MG 24 hr tablet, Take 500 mg by mouth at bedtime. Taking 2000 at night, Disp: , Rfl: 4 .  Sodium & Potassium Bicarbonate (ALKA-SELTZER GOLD PO), Take by mouth., Disp: , Rfl:  .  valsartan-hydrochlorothiazide (DIOVAN-HCT) 160-12.5 MG per tablet, Take 1 tablet by mouth daily., Disp: , Rfl:   Allergies  Allergen Reactions  . Tomato Other (See Comments)    GI distress    Review of Systems  Constitutional: Positive for fever, chills and malaise/fatigue. Negative for weight loss.  HENT: Positive for sore throat. Negative for congestion, ear pain and hearing loss.   Eyes: Negative for blurred vision and double vision.  Respiratory: Negative for cough, shortness of breath and  wheezing.   Cardiovascular: Negative for chest pain, palpitations and leg swelling.  Gastrointestinal: Negative for heartburn, abdominal pain and blood in stool.  Genitourinary: Negative for dysuria, urgency and frequency.  Musculoskeletal: Positive for myalgias.  Skin: Positive for rash.  Neurological: Positive for headaches. Negative for dizziness, tremors and weakness.      Objective  Filed Vitals:   10/10/15 1008  BP: 124/84  Pulse: 102  Temp: 100.6 F (38.1 C)  Resp: 16  Height: 6\' 1"  (1.854 m)  Weight: 260 lb 12.8 oz (118.298 kg)  SpO2: 97%     Physical Exam  Constitutional: He is oriented to person, place, and time and well-developed, well-nourished, and in no distress. No distress.  HENT:  Head: Normocephalic and atraumatic.  Right Ear: External ear normal.  Left Ear: External ear normal.  Nose: Nose normal.  Cardiovascular: Regular rhythm and normal heart sounds.  Tachycardia present.  Exam reveals no gallop and no friction rub.   No murmur heard. Pulmonary/Chest: No respiratory distress. He has no wheezes. He has no rales.  Musculoskeletal: He exhibits no edema.  Lymphadenopathy:    He has cervical adenopathy (mildly enlarged and tender ant. cervical nodes).  Neurological: He is alert and oriented to person, place, and time.  Skin: Skin is warm and dry. No rash noted.  Vitals reviewed.     Recent Results (from the past 2160 hour(s))  POCT rapid strep A     Status: Normal   Collection Time: 10/10/15 10:23 AM  Result Value Ref Range   Rapid Strep A Screen Negative Negative     Assessment & Plan 1. Fever, unspecified fever cause  - POCT rapid strep A-neg - POCT Influenza A/B-neg  2. Pharyngitis  - penicillin v potassium (VEETID) 500 MG tablet; Take 1 tablet (500 mg total) by mouth 3 (three) times daily.  Dispense: 30 tablet; Refill: 0 - acetaminophen-codeine (TYLENOL #3) 300-30 MG tablet; Take 1 tablet by mouth every 6 (six) hours as needed for  moderate pain. Take with food.  Dispense: 12 tablet; Refill: 0

## 2015-10-11 LAB — POCT INFLUENZA A/B
INFLUENZA A, POC: NEGATIVE
Influenza B, POC: NEGATIVE

## 2015-11-07 DIAGNOSIS — E119 Type 2 diabetes mellitus without complications: Secondary | ICD-10-CM | POA: Diagnosis not present

## 2015-11-13 DIAGNOSIS — E119 Type 2 diabetes mellitus without complications: Secondary | ICD-10-CM | POA: Diagnosis not present

## 2015-11-15 DIAGNOSIS — F4320 Adjustment disorder, unspecified: Secondary | ICD-10-CM | POA: Diagnosis not present

## 2015-12-06 ENCOUNTER — Ambulatory Visit (INDEPENDENT_AMBULATORY_CARE_PROVIDER_SITE_OTHER): Payer: 59 | Admitting: *Deleted

## 2015-12-06 DIAGNOSIS — Z23 Encounter for immunization: Secondary | ICD-10-CM

## 2016-01-02 ENCOUNTER — Ambulatory Visit (INDEPENDENT_AMBULATORY_CARE_PROVIDER_SITE_OTHER): Payer: 59 | Admitting: Family Medicine

## 2016-01-02 ENCOUNTER — Encounter: Payer: Self-pay | Admitting: Family Medicine

## 2016-01-02 VITALS — BP 136/86 | HR 69 | Temp 97.8°F | Resp 16 | Ht 73.0 in | Wt 254.2 lb

## 2016-01-02 DIAGNOSIS — R5382 Chronic fatigue, unspecified: Secondary | ICD-10-CM

## 2016-01-02 DIAGNOSIS — F4320 Adjustment disorder, unspecified: Secondary | ICD-10-CM | POA: Diagnosis not present

## 2016-01-02 DIAGNOSIS — I10 Essential (primary) hypertension: Secondary | ICD-10-CM | POA: Diagnosis not present

## 2016-01-02 DIAGNOSIS — R5383 Other fatigue: Secondary | ICD-10-CM | POA: Insufficient documentation

## 2016-01-02 DIAGNOSIS — E119 Type 2 diabetes mellitus without complications: Secondary | ICD-10-CM

## 2016-01-02 DIAGNOSIS — F329 Major depressive disorder, single episode, unspecified: Secondary | ICD-10-CM | POA: Diagnosis not present

## 2016-01-02 DIAGNOSIS — K219 Gastro-esophageal reflux disease without esophagitis: Secondary | ICD-10-CM | POA: Insufficient documentation

## 2016-01-02 DIAGNOSIS — F32A Depression, unspecified: Secondary | ICD-10-CM

## 2016-01-02 DIAGNOSIS — E1169 Type 2 diabetes mellitus with other specified complication: Secondary | ICD-10-CM | POA: Insufficient documentation

## 2016-01-02 DIAGNOSIS — M79662 Pain in left lower leg: Secondary | ICD-10-CM

## 2016-01-02 MED ORDER — VALSARTAN-HYDROCHLOROTHIAZIDE 160-12.5 MG PO TABS: 1.0000 | ORAL_TABLET | Freq: Every day | ORAL | Status: DC

## 2016-01-02 MED ORDER — FLUOXETINE HCL 20 MG PO CAPS: 20.0000 mg | ORAL_CAPSULE | Freq: Every day | ORAL | Status: DC

## 2016-01-02 MED ORDER — ESOMEPRAZOLE MAGNESIUM 40 MG PO CPDR: 40.0000 mg | DELAYED_RELEASE_CAPSULE | Freq: Every day | ORAL | Status: DC

## 2016-01-02 NOTE — Progress Notes (Signed)
Name: [name]   MRN: [id number]    DOB: May 04, 1976   Date:01/02/2016       Progress Note  Subjective  Chief Complaint  Chief Complaint  Patient presents with  . Follow-up    HPI Here to f/u of HBP and depression.  Has GERD that is doing well.  Sees Dr. Gabriel Carina for his DM.   A1cs in 6.0 range.  C/o feeling tired  .   He has started a work out program since Feb.  Doesn't want to change from his Prozac at present. C/o some tightness of L calf and he is afraid it will lead to Achilles tendonitis again. No problem-specific assessment & plan notes found for this encounter.   Past Medical History  Diagnosis Date  . Hypertension   . Insomnia   . Seasonal allergies   . Diabetes ([place])   . GERD (gastroesophageal reflux disease)   . Plantar fasciitis     Past Surgical History  Procedure Laterality Date  . Clavide surgery  2005  . Shoulder surgery Right 2008  . Knee surgery Right 2013    Family History  Problem Relation Age of Onset  . Cancer Mother     colon/rectal  . Cancer Brother     hodgekins  . Cancer Maternal Grandfather     lung  . Stroke Maternal Grandfather     Social History   Social History  . Marital Status: Married    Spouse Name: N/A  . Number of Children: N/A  . Years of Education: N/A   Occupational History  . Not on file.   Social History Main Topics  . Smoking status: Never Smoker   . Smokeless tobacco: Never Used  . Alcohol Use: No  . Drug Use: No  . Sexual Activity: Not on file   Other Topics Concern  . Not on file   Social History Narrative     Current outpatient prescriptions:  .  esomeprazole (NEXIUM) 40 MG capsule, Take 1 capsule (40 mg total) by mouth daily at 12 noon., Disp: 90 capsule, Rfl: 3 .  etodolac (LODINE) 500 MG tablet, Take 500 mg by mouth 2 (two) times daily., Disp: , Rfl:  .  FLUoxetine (PROZAC) 20 MG capsule, Take 1 capsule (20 mg total) by mouth daily., Disp: 90 capsule, Rfl: 3 .  ibuprofen (ADVIL,MOTRIN) 200  MG tablet, Take 200 mg by mouth every 6 (six) hours as needed., Disp: , Rfl:  .  metFORMIN (GLUCOPHAGE-XR) 500 MG 24 hr tablet, Take 500 mg by mouth at bedtime. Taking 2000 at night, Disp: , Rfl: 4 .  Sodium & Potassium Bicarbonate (ALKA-SELTZER GOLD PO), Take by mouth., Disp: , Rfl:  .  valsartan-hydrochlorothiazide (DIOVAN-HCT) 160-12.5 MG tablet, Take 1 tablet by mouth daily., Disp: 90 tablet, Rfl: 3 .  acetaminophen-codeine (TYLENOL #3) 300-30 MG tablet, Take 1 tablet by mouth every 6 (six) hours as needed for moderate pain. Take with food. (Patient not taking: Reported on 01/02/2016), Disp: 12 tablet, Rfl: 0 .  atovaquone-proguanil (MALARONE) 250-100 MG TABS tablet, Reported on 01/02/2016, Disp: , Rfl: 0  Allergies  Allergen Reactions  . Tomato Other (See Comments)    GI distress     Review of Systems  Constitutional: Positive for weight loss (desired) and malaise/fatigue. Negative for fever and chills.  HENT: Negative for hearing loss.   Eyes: Negative for blurred vision and double vision.  Respiratory: Negative for cough, shortness of breath and wheezing.   Cardiovascular: Negative for  chest pain, palpitations and leg swelling.  Gastrointestinal: Negative for heartburn, abdominal pain and blood in stool.  Genitourinary: Negative for dysuria, urgency and frequency.  Musculoskeletal: Negative for myalgias and joint pain.  Skin: Negative for rash.  Neurological: Negative for tingling, tremors, weakness and headaches.      Objective  Filed Vitals:   01/02/16 0811  BP: 136/86  Pulse: 69  Temp: 97.8 F (36.6 C)  TempSrc: Oral  Resp: 16  Height: 6\' 1"  (1.854 m)  Weight: 254 lb 3.2 oz (115.304 kg)  SpO2: 99%    Physical Exam  Constitutional: He is oriented to person, place, and time and well-developed, well-nourished, and in no distress. No distress.  HENT:  Head: Normocephalic and atraumatic.  Eyes: Conjunctivae are normal. Pupils are equal, round, and reactive to light.  No scleral icterus.  Neck: Normal range of motion. Neck supple. Carotid bruit is not present. No thyromegaly present.  Cardiovascular: Normal rate, regular rhythm and normal heart sounds.  Exam reveals no gallop and no friction rub.   No murmur heard. Pulmonary/Chest: Effort normal and breath sounds normal. No respiratory distress. He has no wheezes. He has no rales.  Abdominal: Soft. Bowel sounds are normal. He exhibits no distension, no abdominal bruit and no mass. There is no tenderness.  Musculoskeletal: He exhibits no edema.  Lymphadenopathy:    He has no cervical adenopathy.  Neurological: He is alert and oriented to person, place, and time.  Vitals reviewed.      Recent Results (from the past 2160 hour(s))  POCT rapid strep A     Status: Normal   Collection Time: 10/10/15 10:23 AM  Result Value Ref Range   Rapid Strep A Screen Negative Negative  POCT Influenza A/B     Status: Normal   Collection Time: 10/11/15 12:21 PM  Result Value Ref Range   Influenza A, POC Negative Negative   Influenza B, POC Negative Negative     Assessment & Plan  Problem List Items Addressed This Visit      Cardiovascular and Mediastinum   HBP (high blood pressure) - Primary   Relevant Medications   valsartan-hydrochlorothiazide (DIOVAN-HCT) 160-12.5 MG tablet   Other Relevant Orders   Comprehensive Metabolic Panel (CMET)     Digestive   GERD (gastroesophageal reflux disease)   Relevant Medications   esomeprazole (NEXIUM) 40 MG capsule     Endocrine   Diabetes mellitus type 2, controlled, without complications (HCC)   Relevant Medications   valsartan-hydrochlorothiazide (DIOVAN-HCT) 160-12.5 MG tablet     Other   Depression   Relevant Medications   FLUoxetine (PROZAC) 20 MG capsule   Fatigue   Relevant Orders   CBC with Differential   Lipid Profile   TSH    Other Visit Diagnoses    Calf pain, left        Relevant Orders    Ambulatory referral to Physical Therapy        Meds ordered this encounter  Medications  . etodolac (LODINE) 500 MG tablet    Sig: Take 500 mg by mouth 2 (two) times daily.  . valsartan-hydrochlorothiazide (DIOVAN-HCT) 160-12.5 MG tablet    Sig: Take 1 tablet by mouth daily.    Dispense:  90 tablet    Refill:  3  . FLUoxetine (PROZAC) 20 MG capsule    Sig: Take 1 capsule (20 mg total) by mouth daily.    Dispense:  90 capsule    Refill:  3  . esomeprazole (NEXIUM) 40  MG capsule    Sig: Take 1 capsule (40 mg total) by mouth daily at 12 noon.    Dispense:  90 capsule    Refill:  3   1. Essential hypertension  - Comprehensive Metabolic Panel (CMET) - valsartan-hydrochlorothiazide (DIOVAN-HCT) 160-12.5 MG tablet; Take 1 tablet by mouth daily.  Dispense: 90 tablet; Refill: 3  2. Controlled type 2 diabetes mellitus without complication, without long-term current use of insulin ([place])   3. Depression  - FLUoxetine (PROZAC) 20 MG capsule; Take 1 capsule (20 mg total) by mouth daily.  Dispense: 90 capsule; Refill: 3  4. Chronic fatigue  - CBC with Differential - Lipid Profile - TSH  5. Calf pain, left  - Ambulatory referral to Physical Therapy  6. Gastroesophageal reflux disease without esophagitis  - esomeprazole (NEXIUM) 40 MG capsule; Take 1 capsule (40 mg total) by mouth daily at 12 noon.  Dispense: 90 capsule; Refill: 3

## 2016-01-04 DIAGNOSIS — R5382 Chronic fatigue, unspecified: Secondary | ICD-10-CM | POA: Diagnosis not present

## 2016-01-04 DIAGNOSIS — I10 Essential (primary) hypertension: Secondary | ICD-10-CM | POA: Diagnosis not present

## 2016-01-05 LAB — COMPREHENSIVE METABOLIC PANEL
ALT: 26 IU/L (ref 0–44)
AST: 20 IU/L (ref 0–40)
Albumin/Globulin Ratio: 2.2 (ref 1.2–2.2)
Albumin: 4.7 g/dL (ref 3.5–5.5)
Alkaline Phosphatase: 67 IU/L (ref 39–117)
BUN/Creatinine Ratio: 18 (ref 9–20)
BUN: 18 mg/dL (ref 6–24)
Bilirubin Total: 0.4 mg/dL (ref 0.0–1.2)
CALCIUM: 9.6 mg/dL (ref 8.7–10.2)
CO2: 24 mmol/L (ref 18–29)
CREATININE: 1.01 mg/dL (ref 0.76–1.27)
Chloride: 102 mmol/L (ref 96–106)
GFR calc Af Amer: 106 mL/min/{1.73_m2} (ref >59)
GFR, EST NON AFRICAN AMERICAN: 91 mL/min/{1.73_m2} (ref >59)
GLOBULIN, TOTAL: 2.1 g/dL (ref 1.5–4.5)
GLUCOSE: 99 mg/dL (ref 65–99)
Potassium: 4.4 mmol/L (ref 3.5–5.2)
SODIUM: 142 mmol/L (ref 134–144)
Total Protein: 6.8 g/dL (ref 6.0–8.5)

## 2016-01-05 LAB — CBC WITH DIFFERENTIAL/PLATELET
BASOS: 0 %
Basophils Absolute: 0 10*3/uL (ref 0.0–0.2)
EOS (ABSOLUTE): 0.2 10*3/uL (ref 0.0–0.4)
EOS: 3 %
Hematocrit: 41.9 % (ref 37.5–51.0)
Hemoglobin: 14.1 g/dL (ref 12.6–17.7)
IMMATURE GRANS (ABS): 0 10*3/uL (ref 0.0–0.1)
IMMATURE GRANULOCYTES: 0 %
LYMPHS: 19 %
Lymphocytes Absolute: 1.2 10*3/uL (ref 0.7–3.1)
MCH: 28.4 pg (ref 26.6–33.0)
MCHC: 33.7 g/dL (ref 31.5–35.7)
MCV: 84 fL (ref 79–97)
MONOCYTES: 8 %
MONOS ABS: 0.5 10*3/uL (ref 0.1–0.9)
NEUTROS PCT: 70 %
Neutrophils Absolute: 4.4 10*3/uL (ref 1.4–7.0)
PLATELETS: 260 10*3/uL (ref 150–379)
RBC: 4.97 x10E6/uL (ref 4.14–5.80)
RDW: 14 % (ref 12.3–15.4)
WBC: 6.3 10*3/uL (ref 3.4–10.8)

## 2016-01-05 LAB — LIPID PANEL
CHOLESTEROL TOTAL: 183 mg/dL (ref 100–199)
Chol/HDL Ratio: 3.5 ratio units (ref 0.0–5.0)
HDL: 52 mg/dL (ref >39)
LDL Calculated: 107 mg/dL — ABNORMAL HIGH (ref 0–99)
Triglycerides: 119 mg/dL (ref 0–149)
VLDL Cholesterol Cal: 24 mg/dL (ref 5–40)

## 2016-01-05 LAB — TSH: TSH: 1.44 u[IU]/mL (ref 0.450–4.500)

## 2016-01-23 DIAGNOSIS — E119 Type 2 diabetes mellitus without complications: Secondary | ICD-10-CM | POA: Diagnosis not present

## 2016-02-22 ENCOUNTER — Encounter: Payer: Self-pay | Admitting: Podiatry

## 2016-02-22 ENCOUNTER — Ambulatory Visit (INDEPENDENT_AMBULATORY_CARE_PROVIDER_SITE_OTHER): Payer: 59

## 2016-02-22 ENCOUNTER — Ambulatory Visit (INDEPENDENT_AMBULATORY_CARE_PROVIDER_SITE_OTHER): Payer: 59 | Admitting: Podiatry

## 2016-02-22 ENCOUNTER — Other Ambulatory Visit: Payer: Self-pay | Admitting: Family Medicine

## 2016-02-22 ENCOUNTER — Telehealth: Payer: Self-pay | Admitting: *Deleted

## 2016-02-22 VITALS — BP 154/99 | HR 73 | Resp 18

## 2016-02-22 DIAGNOSIS — R52 Pain, unspecified: Secondary | ICD-10-CM | POA: Diagnosis not present

## 2016-02-22 DIAGNOSIS — M7660 Achilles tendinitis, unspecified leg: Secondary | ICD-10-CM

## 2016-02-22 DIAGNOSIS — M722 Plantar fascial fibromatosis: Secondary | ICD-10-CM | POA: Diagnosis not present

## 2016-02-22 DIAGNOSIS — K219 Gastro-esophageal reflux disease without esophagitis: Secondary | ICD-10-CM

## 2016-02-22 MED ORDER — ETODOLAC 500 MG PO TABS: 500.0000 mg | ORAL_TABLET | Freq: Two times a day (BID) | ORAL | Status: DC

## 2016-02-22 MED ORDER — ESOMEPRAZOLE MAGNESIUM 40 MG PO CPDR: 40.0000 mg | DELAYED_RELEASE_CAPSULE | Freq: Two times a day (BID) | ORAL | Status: DC

## 2016-02-22 NOTE — Telephone Encounter (Signed)
Yes, OK to change to BID and send in 180 with 3 refills.-jh

## 2016-02-22 NOTE — Telephone Encounter (Addendum)
-----   Message from Trula Slade, DPM sent at 02/22/2016 10:05 AM EDT ----- Can you order an MRI of the left foot and ankle for [facility]. Chronic plantar fasciitis and achilles tendonitis for 2 years, rule out tear. Thanks. Orders to [facility] for pre-cert. [id number] authorization not needed for MRI from [facility]. Faxed to [place].

## 2016-02-22 NOTE — Telephone Encounter (Signed)
He is supposed to be on Nexium 40 mg/d.  He was supposed to have gotten 90 with 3 refills for 1 year prescription.  Call wife and confirm.  May refill as above if needed.-jh

## 2016-02-22 NOTE — Progress Notes (Signed)
   Subjective:    Patient ID: [name], male    DOB: 1976/03/23, 40 y.o.   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 He is previously been treated for plantar fasciitis of the Achilles tendinitis. He said multiple steroid injections as well as a night splint. He has tried shoe changes as well as orthotics without much relief of symptoms. He had to switch doctors due to insurance reasons. He is tried taken etodolac and asking for refill today. His last A1c was 5.6. No other complaints.   Review of Systems  All other systems reviewed and are negative.      Objective:   Physical Exam General: AAO x3, NAD  Dermatological: Skin is warm, dry and supple bilateral. Nails x 10 are well manicured; remaining integument appears unremarkable at this time. There are no open sores, no preulcerative lesions, no rash or signs of infection present.  Vascular: Dorsalis Pedis artery and Posterior Tibial artery pedal pulses are 2/4 bilateral with immedate capillary fill time. Pedal hair growth present. There is no pain with calf compression, swelling, warmth, erythema.   Neruologic: Grossly intact via light touch bilateral. Vibratory intact via tuning fork bilateral. Protective threshold with Semmes Wienstein monofilament intact to all pedal sites bilateral.   Musculoskeletal: There is tenderness palpation upon plantar meals of the calcaneus at the insertion of myofascial in the left side. There is no pain within the arch of the foot. There is mild discomfort on the course the Achilles tendon equinus is present. There is no pain with lateral compression of calcaneus. No other areas of tenderness bilaterally. MMT 5/5.  Gait: Unassisted, Nonantalgic.      Assessment & Plan:  40 year old male with left chronic plantar fasciitis, Achilles tendinitis -Treatment options discussed including all alternatives, risks, and  complications -X-rays were obtained and reviewed with the patient. No evidence of acute fracture. -Etiology of symptoms were discussed -At this time given the longevity of symptoms were discussed with him an MRI to rule out partial tearing or other pathology. MRI was ordered today. -Continue stretching, icing as well as anti-inflammatories. Refilled etodolac today. -Discussed with him further treatment options including EPAT, physical therapy, surgery. -Follow-up after MRI or sooner if any issues are to arise.  Celesta Gentile, DPM

## 2016-02-22 NOTE — Telephone Encounter (Signed)
Patient has been taking bid and not qd. Prescription needs to be changed in order to refill. Are you ok with that?

## 2016-02-22 NOTE — Telephone Encounter (Signed)
Done.[place] 

## 2016-02-22 NOTE — Telephone Encounter (Signed)
Pt. Wife called states that Pt is taking Nexium and according to the prescription  He didn't have enough medicine so she is thinking more medicine need to be called into the drug store.  Wife call back # (w) [phone],  (c) [phone]

## 2016-02-25 ENCOUNTER — Encounter: Payer: Self-pay | Admitting: Podiatry

## 2016-02-25 DIAGNOSIS — M722 Plantar fascial fibromatosis: Secondary | ICD-10-CM | POA: Insufficient documentation

## 2016-02-25 DIAGNOSIS — M7660 Achilles tendinitis, unspecified leg: Secondary | ICD-10-CM | POA: Insufficient documentation

## 2016-02-27 ENCOUNTER — Ambulatory Visit: Payer: 59 | Attending: Family Medicine

## 2016-02-27 DIAGNOSIS — M6281 Muscle weakness (generalized): Secondary | ICD-10-CM | POA: Diagnosis not present

## 2016-02-27 DIAGNOSIS — R29898 Other symptoms and signs involving the musculoskeletal system: Secondary | ICD-10-CM | POA: Diagnosis not present

## 2016-02-27 DIAGNOSIS — M25572 Pain in left ankle and joints of left foot: Secondary | ICD-10-CM | POA: Diagnosis not present

## 2016-02-27 NOTE — Therapy (Signed)
[place] MAIN [facility] SERVICES [address] [place], [place], [address] Phone: [phone]   Fax:  [phone]  Physical Therapy Evaluation  Patient Details  Name: [name] MRN: [place]:[id number] Date of Birth: Aug 27, 1976 Referring Provider: Dr. Luan Pulling  Encounter Date: 02/27/2016      PT End of Session - 02/27/16 0907    Visit Number 1   Number of Visits 17   Date for PT Re-Evaluation 03/26/16   PT Start Time 0805   PT Stop Time 0900   PT Time Calculation (min) 55 min   Activity Tolerance Patient tolerated treatment well   Behavior During Therapy [facility] for tasks assessed/performed      Past Medical History  Diagnosis Date  . Hypertension   . Insomnia   . Seasonal allergies   . Diabetes ([place])   . GERD (gastroesophageal reflux disease)   . Plantar fasciitis     Past Surgical History  Procedure Laterality Date  . Clavide surgery  2005  . Shoulder surgery Right 2008  . Knee surgery Right 2013    There were no vitals filed for this visit.       Subjective Assessment - 02/27/16 0810    Subjective pt reported L achilles pain initially started a couple years ago that began with plantar fascitis and progressed to achilles tendinitis. he states that he has a bone spur that he is delaying removal of as long as possible becuase he is unable to be in recovery for that long. he was treated for this same issue last year  and he reports getting more active (running, sports) but has got progressively worse over the past 6-7 weeks. he reports that it is worse at the end of the day. he is taking Lodine which helped initially but has since plateaued in its pain relieving effects. 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 he reports he has stopped running and decreased crossfit to 2-3 times a week versus 4-5. he reports the pain has started progessing up his calf and into his knee some.   Limitations Standing;Walking   How  long can you walk comfortably? few hours   Patient Stated Goals decrease pain   Currently in Pain? Yes   Pain Score 3    Pain Location Ankle   Pain Orientation Left   Pain Descriptors / Indicators Aching   Pain Type Chronic pain   Pain Onset More than a month ago   Pain Frequency Constant   Aggravating Factors  walking, running, any kind of movement   Pain Relieving Factors stretching, medication, ice            [facility] PT Assessment - 02/27/16 0001    Assessment   Medical Diagnosis L calf pain   Referring Provider Dr. Luan Pulling   Onset Date/Surgical Date 11/17/14   Hand Dominance Right   Prior Therapy same issue last year   Balance Screen   Has the patient fallen in the past 6 months No   Has the patient had a decrease in activity level because of a fear of falling?  Yes   Is the patient reluctant to leave their home because of a fear of falling?  No   Home Social worker Private residence   Living Arrangements Spouse/significant other   Available Help at Discharge Family   Type of [place] to enter   Entrance [place] of  Steps 2   Entrance Stairs-Rails Left   Home Layout Two level   Alternate Level Stairs-Number of Steps 12   Alternate Level Stairs-Rails Left   Home Equipment None   Prior Function   Level of Independence Independent   Vocation Full time employment   Leisure motorcycles, crossfit, running       REFLEXES Bil L3 and L4: diminished with jendrassik maneuver  ROM  Left Right  DF Knee bent: +5 deg Knee straight: +10 deg Knee bent: 0 deg Knee straight: +5 deg    STRENGTH  Left Right  Hip flexion 5 5  Hip extension 4+ 4+  Hip IR/ER 5/4 5/4  Hip abduction 4+ 4+  Hip adduction 4+ 4+  Knee flexion 5 5  Knee extension 5 5  Ankle DF 4+ 5  Ankle PF 4 (pain) 5  Ankle eversion 4+ 5  Ankle inversion 5 5  Core strength 4-     PALPATION Soft tissue restrictions in L gastroc, soleus, achilles tendon,  glute max/med, ITB, and mild restrictions with crepitus in plantar fascia. Tenderness to palpation of L gastroc/soleus, achilles tendon, and slight pain to palpation of L plantar fascia. Bil longitudinal arches normal in WB, pt in STJ neutral in WB, mild decrease in transverse arch of R foot.  GIRTH MEASUREMENTS Gastroc/soleus:  L = 16.1"  R = 16.75"  SPECIAL TESTS Ely's test: +Bil Ober's test: - Bil, L>R in restrictions FABER: - Bil, L>R in restrictions  GAIT increased IR of R hip, increased ER of L hip; mild antalgic gait on L; increased medial loading during push-off L>R  FUNCTIONAL MOVEMENTS Increased ankle instability bil with stair descent and SLS          PT Long Term Goals - 02/27/16 [id number]    PT LONG TERM GOAL #1   Title pt will have a decreased in LEFS score by >9 points to demonstrate improved overall function and increased ability to perform higher-level activities.   Baseline 46   Time 8   Period Weeks   Status New   PT LONG TERM GOAL #2   Title pt will have increased L gastroc/soleus muscle mass by >0.5 inches to decrease gait, squat, SLS deviations and demonstrate improved overall function.   Baseline 16.1" L, 16.75" R   Time 8   Period Weeks   Status New   PT LONG TERM GOAL #3   Title pt will be able to run for 0.5 miles <3/10 pain in L achilles so pt can return to exercising regularly.   Baseline not running   Time 8   Period Weeks   Status New   PT LONG TERM GOAL #4   Title pt will be able to increase crossfit frequency to 4x/week with <3/10 pain in L achilles so pt can return to exercising regularly.   Baseline  2-3x/week crossfit (was going 4-5x/week)   Time 8   Period Weeks   Status New               Plan - 02/27/16 0912    Clinical Impression Statement Pt is 40 YO M with complaints of chronic L calf and achilles pain. He presents with deficits in strength, balance, gait, functional movements (squats, SLS), decreased L gastroc/soleus  mass, decreased ROM on R, and increased pain to palpation of L calf and L glute. He also demonstrates decreased core strength and B hip strength, L > R. He has mild antalgic gait on L and demonstrates increased  push off of medial border of foot L>R, increased IR of R knee, and ER of L knee. He has mild crepitus in L plantar fascia with slight pain to palpation. This pain has also caused the pt to modify his physical activity and slightly affected his work. pt needs skilled PT intervention to address deficits in order to maximize and improve overall function at work and help return pt to sport/crossfit.   Rehab Potential Fair   Clinical Impairments Affecting Rehab Potential chronicity of issue, pain, bone spur identified, hx of DM   PT Frequency 2x / week   PT Duration 8 weeks   PT Treatment/Interventions ADLs/Self Care Home Management;Cryotherapy;Iontophoresis 4mg /ml Dexamethasone;Gait training;Stair training;Functional mobility training;Therapeutic exercise;Balance training;Neuromuscular re-education;Patient/family education;Manual techniques;Passive range of motion;Dry needling;Taping   PT Next Visit Plan review previous HEP, manual techniques, stretching, strengthening   PT Home Exercise Plan hip strengthening, stretching   Consulted and Agree with Plan of Care Patient      Patient will benefit from skilled therapeutic intervention in order to improve the following deficits and impairments:  Abnormal gait, Decreased balance, Decreased range of motion, Decreased strength, Difficulty walking, Improper body mechanics, Pain, Increased muscle spasms  Visit Diagnosis: Pain in left ankle and joints of left foot - Plan: PT plan of care cert/re-cert     Problem List Patient Active Problem List   Diagnosis Date Noted  . Plantar fasciitis 02/25/2016  . Achilles tendinitis 02/25/2016  . Diabetes mellitus type 2, controlled, without complications ([place]) [id number]  . Fatigue 01/02/2016  . GERD  (gastroesophageal reflux disease) 01/02/2016  . Pharyngitis 10/10/2015  . Pyrexia 10/10/2015  . HBP (high blood pressure) 06/27/2015  . DM (diabetes mellitus) ([place]) 06/27/2015  . Depression 06/27/2015  . Travel advice encounter 05/26/2015   [name], SPT This entire session was performed under direct supervision and direction of a licensed therapist/therapist assistant . I have personally read, edited and approve of the note as written. Gorden Harms. Tortorici, PT, DPT [phone]  Tortorici,Ashley 02/27/2016, 2:49 PM  [place] MAIN [facility] SERVICES [address] [place], [place], [address] Phone: [phone]   Fax:  [phone]  Name: RUI TOMEK MRN: [id number] Date of Birth: 11-11-1975

## 2016-02-29 ENCOUNTER — Ambulatory Visit: Payer: 59

## 2016-02-29 DIAGNOSIS — M6281 Muscle weakness (generalized): Secondary | ICD-10-CM | POA: Diagnosis not present

## 2016-02-29 DIAGNOSIS — R29898 Other symptoms and signs involving the musculoskeletal system: Secondary | ICD-10-CM | POA: Diagnosis not present

## 2016-02-29 DIAGNOSIS — M25572 Pain in left ankle and joints of left foot: Secondary | ICD-10-CM | POA: Diagnosis not present

## 2016-02-29 DIAGNOSIS — M25673 Stiffness of unspecified ankle, not elsewhere classified: Secondary | ICD-10-CM

## 2016-02-29 NOTE — Therapy (Signed)
[place] MAIN [facility] SERVICES [address] [place], [place], [address] Phone: [phone]   Fax:  [phone]  Physical Therapy Treatment  Patient Details  Name: [name] MRN: [id number] Date of Birth: [birth year] Referring Provider: Dr. Luan Pulling  Encounter Date: 02/29/2016      PT End of Session - 02/29/16 0909    Visit Number 2   Number of Visits 17   Date for PT Re-Evaluation 03/26/16   PT Start Time 0807   PT Stop Time 0907   PT Time Calculation (min) 60 min   Activity Tolerance Patient tolerated treatment well   Behavior During Therapy [facility] for tasks assessed/performed      Past Medical History  Diagnosis Date  . Hypertension   . Insomnia   . Seasonal allergies   . Diabetes ([place])   . GERD (gastroesophageal reflux disease)   . Plantar fasciitis     Past Surgical History  Procedure Laterality Date  . Clavide surgery  2005  . Shoulder surgery Right 2008  . Knee surgery Right 2013    There were no vitals filed for this visit.      Subjective Assessment - 02/29/16 0827    Subjective pt reports continued calf pain   Limitations Standing;Walking   How long can you walk comfortably? few hours   Patient Stated Goals decrease pain   Pain Score 3    Pain Location Calf   Pain Orientation Left   Pain Descriptors / Indicators Aching;Sore   Pain Onset More than a month ago      THEREX Standing L gastroc and soleus stretch at stairs, 3x30 sec each Standing L eccentric PF 3x10 with 5 sec lowering; bil concentric PF to L eccentric lowering Resisted walking fwd/retro x 10 each with 25# Resisted bil side stepping x 10 each with 20#; mild ankle instability noted with side stepping R Pt requires min verbal and tactile cues for proper exercise performance   MANUAL Deep efflurage to L gastroc soleus x 15 min and cross friction massage to L achilles tendon x 10 min; moderate restrictions to in both areas  NMR Bil SLS on  airex 10 x 30 sec w/ no UE support; increased ankle strategies throughout BLE but L>R Core stability in bil half kneeling with resisted Pavlov press with BTB x 15 each, with 3 sec hold at end        PT Education - 02/29/16 0829    Education provided Yes   Education Details exercise technique, HEP   Person(s) Educated Patient   Methods Explanation   Comprehension Verbalized understanding             PT Long Term Goals - 02/27/16 0927    PT LONG TERM GOAL #1   Title pt will have a decreased in LEFS score by >9 points to demonstrate improved overall function and increased ability to perform higher-level activities.   Baseline 46   Time 8   Period Weeks   Status New   PT LONG TERM GOAL #2   Title pt will have increased L gastroc/soleus muscle mass by >0.5 inches to decrease gait, squat, SLS deviations and demonstrate improved overall function.   Baseline 16.1" L, 16.75" R   Time 8   Period Weeks   Status New   PT LONG TERM GOAL #3   Title pt will be able to run for 0.5 miles <3/10 pain in L achilles so pt can return to exercising regularly.  Baseline not running   Time 8   Period Weeks   Status New   PT LONG TERM GOAL #4   Title pt will be able to increase crossfit frequency to 4x/week with <3/10 pain in L achilles so pt can return to exercising regularly.   Baseline  2-3x/week crossfit (was going 4-5x/week)   Time 8   Period Weeks   Status New               Plan - 02/29/16 0831    Clinical Impression Statement pt presents with continued L calf pain, gastroc/soleus soft tissue restrictions, and LLE weakness. pt demonstrated increased ankle instability with balance and resisted walking exercises. He tolerated all exercises well but demonstrated fatigue at end of session. pt needs continued skilled PT intervention to address remaining deficits to maximize overall function.   Rehab Potential Fair   Clinical Impairments Affecting Rehab Potential chronicity of  issue, pain, bone spur identified, hx of DM   PT Frequency 2x / week   PT Duration 8 weeks   PT Treatment/Interventions ADLs/Self Care Home Management;Cryotherapy;Iontophoresis 4mg /ml Dexamethasone;Gait training;Stair training;Functional mobility training;Therapeutic exercise;Balance training;Neuromuscular re-education;Patient/family education;Manual techniques;Passive range of motion;Dry needling;Taping   PT Next Visit Plan review previous HEP, manual techniques, stretching, strengthening   PT Home Exercise Plan hip strengthening, stretching   Consulted and Agree with Plan of Care Patient      Patient will benefit from skilled therapeutic intervention in order to improve the following deficits and impairments:  Abnormal gait, Decreased balance, Decreased range of motion, Decreased strength, Difficulty walking, Improper body mechanics, Pain, Increased muscle spasms  Visit Diagnosis: Pain in left ankle and joints of left foot  Muscle weakness (generalized)  Decreased ROM of ankle     Problem List Patient Active Problem List   Diagnosis Date Noted  . Plantar fasciitis 02/25/2016  . Achilles tendinitis 02/25/2016  . Diabetes mellitus type 2, controlled, without complications ([place]) [id number]  . Fatigue 01/02/2016  . GERD (gastroesophageal reflux disease) 01/02/2016  . Pharyngitis 10/10/2015  . Pyrexia 10/10/2015  . HBP (high blood pressure) 06/27/2015  . DM (diabetes mellitus) ([place]) 06/27/2015  . Depression 06/27/2015  . Travel advice encounter 05/26/2015   [name], SPT This entire session was performed under direct supervision and direction of a licensed therapist/therapist assistant . I have personally read, edited and approve of the note as written. [name]. Tortorici, PT, DPT [phone]   Tortorici,Ashley 02/29/2016, 12:32 PM  [place] MAIN [facility] SERVICES [address] [place], [place], [address] Phone: [phone]   Fax:   [phone]  Name: [name] MRN: [id number] Date of Birth: 03/09/1976

## 2016-03-05 ENCOUNTER — Ambulatory Visit: Payer: 59

## 2016-03-05 DIAGNOSIS — M6281 Muscle weakness (generalized): Secondary | ICD-10-CM

## 2016-03-05 DIAGNOSIS — M25572 Pain in left ankle and joints of left foot: Secondary | ICD-10-CM

## 2016-03-05 DIAGNOSIS — R29898 Other symptoms and signs involving the musculoskeletal system: Secondary | ICD-10-CM | POA: Diagnosis not present

## 2016-03-05 NOTE — Therapy (Signed)
[place] MAIN [facility] SERVICES [address] [place], [place], [address] Phone: [phone]   Fax:  [phone]  Physical Therapy Treatment  Patient Details  Name: [name] MRN: [id number] Date of Birth: 1975-12-07 Referring Provider: Dr. Luan Pulling  Encounter Date: 03/05/2016      PT End of Session - 03/05/16 0844    Visit Number 3   Number of Visits 17   Date for PT Re-Evaluation 03/26/16   PT Start Time 0810   PT Stop Time 0845   PT Time Calculation (min) 35 min   Activity Tolerance Patient tolerated treatment well   Behavior During Therapy [facility] for tasks assessed/performed      Past Medical History  Diagnosis Date  . Hypertension   . Insomnia   . Seasonal allergies   . Diabetes ([place])   . GERD (gastroesophageal reflux disease)   . Plantar fasciitis     Past Surgical History  Procedure Laterality Date  . Clavide surgery  2005  . Shoulder surgery Right 2008  . Knee surgery Right 2013    There were no vitals filed for this visit.      Subjective Assessment - 03/05/16 0825    Subjective pt reports calf feeling slightly better following last treatment session but reports that it was sore all weekend.   Limitations Standing;Walking   How long can you walk comfortably? few hours   Patient Stated Goals decrease pain   Pain Score 2    Pain Location Ankle   Pain Orientation Left   Pain Descriptors / Indicators Aching;Sore   Pain Onset More than a month ago      Therex: 2 x 10 eccentric PF (5 sec count down) at stairs with BUE support; min cues for proper exercise technique bil tandem stance on 1/2 foam roll 3 x 30 sec each to improve ankle strength  MANUAL Efflurage, IASTM using edge tool; pt with increased soft tissue restrictions to both, significant taut bands palpated in gastroc and soleus which was further addressed with trigger point dry needling; IASTM using edge tool to distal soleus and achilles  tendon Patient received dry needling therapy education and acknowledged understanding of risks and benefits of dry needling therapy prior to receiving treatment. Patient voiced understanding of treatment options and elected to proceed with dry needling therapy.   PT performed Trigger point dry needling to L gastroc soleus taught bands . Twitch response was elicited at each site      PT Long Term Goals - 02/27/16 0927    PT LONG TERM GOAL #1   Title pt will have a decreased in LEFS score by >9 points to demonstrate improved overall function and increased ability to perform higher-level activities.   Baseline 46   Time 8   Period Weeks   Status New   PT LONG TERM GOAL #2   Title pt will have increased L gastroc/soleus muscle mass by >0.5 inches to decrease gait, squat, SLS deviations and demonstrate improved overall function.   Baseline 16.1" L, 16.75" R   Time 8   Period Weeks   Status New   PT LONG TERM GOAL #3   Title pt will be able to run for 0.5 miles <3/10 pain in L achilles so pt can return to exercising regularly.   Baseline not running   Time 8   Period Weeks   Status New   PT LONG TERM GOAL #4   Title pt will be able to  increase crossfit frequency to 4x/week with <3/10 pain in L achilles so pt can return to exercising regularly.   Baseline  2-3x/week crossfit (was going 4-5x/week)   Time 8   Period Weeks   Status New               Plan - 03/05/16 0845    Clinical Impression Statement pt making progress towards goals. He continues to complain of L achilles pain with activity, soft tissues restrictions to gastroc/soleus, and muscle weakness. pt tolerated manual therapy and dry needling well with decreased complaints of pain following. pt still demonstrates ankle weakness as demonstrated by ankle strategies during tandem stance on 1/2 foam roll. pt needs continued skilled PT intervention to address remaining deficits in order to maximize overall funciton and decrease  pain.   Rehab Potential Fair   Clinical Impairments Affecting Rehab Potential chronicity of issue, pain, bone spur identified, hx of DM   PT Frequency 2x / week   PT Duration 8 weeks   PT Treatment/Interventions ADLs/Self Care Home Management;Cryotherapy;Iontophoresis 4mg /ml Dexamethasone;Gait training;Stair training;Functional mobility training;Therapeutic exercise;Balance training;Neuromuscular re-education;Patient/family education;Manual techniques;Passive range of motion;Dry needling;Taping   PT Next Visit Plan review previous HEP, manual techniques, stretching, strengthening   PT Home Exercise Plan hip strengthening, stretching   Consulted and Agree with Plan of Care Patient      Patient will benefit from skilled therapeutic intervention in order to improve the following deficits and impairments:  Abnormal gait, Decreased balance, Decreased range of motion, Decreased strength, Difficulty walking, Improper body mechanics, Pain, Increased muscle spasms  Visit Diagnosis: Pain in left ankle and joints of left foot  Muscle weakness (generalized)     Problem List Patient Active Problem List   Diagnosis Date Noted  . Plantar fasciitis 02/25/2016  . Achilles tendinitis 02/25/2016  . Diabetes mellitus type 2, controlled, without complications ([place]) [id number]  . Fatigue 01/02/2016  . GERD (gastroesophageal reflux disease) 01/02/2016  . Pharyngitis 10/10/2015  . Pyrexia 10/10/2015  . HBP (high blood pressure) 06/27/2015  . DM (diabetes mellitus) ([place]) 06/27/2015  . Depression 06/27/2015  . Travel advice encounter 05/26/2015   [name], SPT This entire session was performed under direct supervision and direction of a licensed therapist/therapist assistant . I have personally read, edited and approve of the note as written. Gorden Harms. Tortorici, PT, DPT [phone]  Tortorici,Ashley 03/05/2016, 2:00 PM  [place] MAIN [facility] SERVICES [address] [name], [place], [address] Phone: [phone]   Fax:  [phone]  Name: [name] MRN: [id number] Date of Birth: 25-Jul-1976

## 2016-03-07 ENCOUNTER — Ambulatory Visit: Payer: 59

## 2016-03-07 DIAGNOSIS — M6281 Muscle weakness (generalized): Secondary | ICD-10-CM | POA: Diagnosis not present

## 2016-03-07 DIAGNOSIS — M25572 Pain in left ankle and joints of left foot: Secondary | ICD-10-CM | POA: Diagnosis not present

## 2016-03-07 DIAGNOSIS — R29898 Other symptoms and signs involving the musculoskeletal system: Secondary | ICD-10-CM | POA: Diagnosis not present

## 2016-03-07 NOTE — Therapy (Signed)
[place] MAIN [facility] SERVICES [address] [place], [place], [address] Phone: [phone]   Fax:  [phone]  Physical Therapy Treatment  Patient Details  Name: [name] MRN: [id number] Date of Birth: 1975/12/08 Referring Provider: Dr. Luan Pulling  Encounter Date: 03/07/2016      PT End of Session - 03/07/16 0932    Visit Number 4   Number of Visits 17   Date for PT Re-Evaluation 03/26/16   PT Start Time 0850   PT Stop Time 0930   PT Time Calculation (min) 40 min   Activity Tolerance Patient tolerated treatment well   Behavior During Therapy [facility] for tasks assessed/performed      Past Medical History  Diagnosis Date  . Hypertension   . Insomnia   . Seasonal allergies   . Diabetes ([place])   . GERD (gastroesophageal reflux disease)   . Plantar fasciitis     Past Surgical History  Procedure Laterality Date  . Clavide surgery  2005  . Shoulder surgery Right 2008  . Knee surgery Right 2013    There were no vitals filed for this visit.      Subjective Assessment - 03/07/16 0946    Subjective pt reports his L calf "felt great" following the manual therapy last treatment session but it tightened back up the following day.   Limitations Standing;Walking   How long can you walk comfortably? few hours   Patient Stated Goals decrease pain   Currently in Pain? Yes   Pain Score 2    Pain Location Calf   Pain Orientation Left   Pain Descriptors / Indicators Aching   Pain Onset More than a month ago     THEREX eccentric PF 1 x 12 (5 sec count down) at stairs star drill with paper plate under R foot, standing on L, 1 x 10; pt demonstrated decreased knee stability throughout  GAIT TRAINING (not billed) Analyzed pt walking in and out of shoes; L ankle inversion during loading response, medial calcaneal whip during toe off  MANUAL Deep efflurage to gastroc/soleus, cross friction to achilles tendon Patient received dry needling  therapy education and acknowledged understanding of risks and benefits of dry needling therapy prior to receiving treatment. Patient voiced understanding of treatment options and elected to proceed with dry needling therapy.   Trigger point dry needling to gastroc and soleus. Twitch response elicited         PT Education - 03/07/16 0932    Education provided Yes   Education Details exercise technique, deviations in gait   Person(s) Educated Patient   Methods Explanation   Comprehension Verbalized understanding             PT Long Term Goals - 02/27/16 [id number]    PT LONG TERM GOAL #1   Title pt will have a decreased in LEFS score by >9 points to demonstrate improved overall function and increased ability to perform higher-level activities.   Baseline 46   Time 8   Period Weeks   Status New   PT LONG TERM GOAL #2   Title pt will have increased L gastroc/soleus muscle mass by >0.5 inches to decrease gait, squat, SLS deviations and demonstrate improved overall function.   Baseline 16.1" L, 16.75" R   Time 8   Period Weeks   Status New   PT LONG TERM GOAL #3   Title pt will be able to run for 0.5 miles <3/10 pain in L achilles so  pt can return to exercising regularly.   Baseline not running   Time 8   Period Weeks   Status New   PT LONG TERM GOAL #4   Title pt will be able to increase crossfit frequency to 4x/week with <3/10 pain in L achilles so pt can return to exercising regularly.   Baseline  2-3x/week crossfit (was going 4-5x/week)   Time 8   Period Weeks   Status New               Plan - 03/07/16 0932    Clinical Impression Statement pt continues to make progress as evidenced by reports of decreased pain following last treatment session. However, pt's symptoms did come back the next day after being on his feet all day. pt still presents with soft tissue restrictions in L gastroc/soleus, achilles tendon, and decreased strength and endurance. pt gait analyzed again  today and his L ankle inverts during loading response and he has a medial whip of calcaneus during toe off. pt needs continued skilled PT intervention to address remaining deficits in order to maximize overall function.   Rehab Potential Fair   Clinical Impairments Affecting Rehab Potential chronicity of issue, pain, bone spur identified, hx of DM   PT Frequency 2x / week   PT Duration 8 weeks   PT Treatment/Interventions ADLs/Self Care Home Management;Cryotherapy;Iontophoresis 4mg /ml Dexamethasone;Gait training;Stair training;Functional mobility training;Therapeutic exercise;Balance training;Neuromuscular re-education;Patient/family education;Manual techniques;Passive range of motion;Dry needling;Taping   PT Next Visit Plan review previous HEP, manual techniques, stretching, strengthening   PT Home Exercise Plan hip strengthening, stretching   Consulted and Agree with Plan of Care Patient      Patient will benefit from skilled therapeutic intervention in order to improve the following deficits and impairments:  Abnormal gait, Decreased balance, Decreased range of motion, Decreased strength, Difficulty walking, Improper body mechanics, Pain, Increased muscle spasms  Visit Diagnosis: Pain in left ankle and joints of left foot  Muscle weakness (generalized)     Problem List Patient Active Problem List   Diagnosis Date Noted  . Plantar fasciitis 02/25/2016  . Achilles tendinitis 02/25/2016  . Diabetes mellitus type 2, controlled, without complications ([place]) [id number]  . Fatigue 01/02/2016  . GERD (gastroesophageal reflux disease) 01/02/2016  . Pharyngitis 10/10/2015  . Pyrexia 10/10/2015  . HBP (high blood pressure) 06/27/2015  . DM (diabetes mellitus) ([place]) 06/27/2015  . Depression 06/27/2015  . Travel advice encounter 05/26/2015    [name], SPT This entire session was performed under direct supervision and direction of a licensed therapist/therapist assistant . I have  personally read, edited and approve of the note as written. [name]. [name], PT, DPT [phone]   [name],[name] 03/07/2016, 2:01 PM  [place] MAIN [facility] SERVICES [address] [place], [place], [address] Phone: [phone]   Fax:  [phone]  Name: [name] MRN: [id number] Date of Birth: [birth year]

## 2016-03-13 ENCOUNTER — Ambulatory Visit: Payer: 59

## 2016-03-13 DIAGNOSIS — M25572 Pain in left ankle and joints of left foot: Secondary | ICD-10-CM | POA: Diagnosis not present

## 2016-03-13 DIAGNOSIS — M6281 Muscle weakness (generalized): Secondary | ICD-10-CM | POA: Diagnosis not present

## 2016-03-13 DIAGNOSIS — R29898 Other symptoms and signs involving the musculoskeletal system: Secondary | ICD-10-CM | POA: Diagnosis not present

## 2016-03-13 NOTE — Therapy (Addendum)
[place] MAIN [facility] SERVICES [address] [place], [place], [address] Phone: [phone]   Fax:  [phone]  Physical Therapy Treatment  Patient Details  Name: [name] MRN: [id number] Date of Birth: [birth year] Referring Provider: Dr. Luan Pulling  Encounter Date: 03/13/2016      PT End of Session - 03/13/16 0843    Visit Number 5   Number of Visits 17   Date for PT Re-Evaluation 03/26/16   PT Start Time 0810   PT Stop Time 0855   PT Time Calculation (min) 45 min   Activity Tolerance Patient tolerated treatment well   Behavior During Therapy [facility] for tasks assessed/performed      Past Medical History  Diagnosis Date  . Hypertension   . Insomnia   . Seasonal allergies   . Diabetes ([place])   . GERD (gastroesophageal reflux disease)   . Plantar fasciitis     Past Surgical History  Procedure Laterality Date  . Clavide surgery  2005  . Shoulder surgery Right 2008  . Knee surgery Right 2013    There were no vitals filed for this visit.      Subjective Assessment - 03/13/16 0842    Subjective pt states that his L calf was not as painful over the weekend and that it is feeling pretty good today.   Limitations Standing;Walking   How long can you walk comfortably? few hours   Patient Stated Goals decrease pain   Currently in Pain? Yes   Pain Score 1    Pain Location Calf   Pain Orientation Left   Pain Onset More than a month ago       THEREX eccentric PF with 5 sec count down with knee straight and bent x 15 each; min verbal cues for proper technique  MANUAL deep efflurage to gastroc/soleus throughout and cross friction massage to tissue surrounding achilles tendon (crepitus noted which was decreased following cross friction) heel lock taping of L ankle with 2" tape; pt educated to take tape off if it begins to feel too tight   Patient received dry needling therapy education and acknowledged understanding of risks and  benefits of dry needling therapy prior to receiving treatment. Patient voiced understanding of treatment options and elected to proceed with dry needling therapy.   PT Gorden Harms. Tortorici, PT, DPT [phone] Trigger point dry needling to L gastroc/soleus. Twitch response and associated deep ache noted per pt.       PT Education - 03/13/16 [phone]    Education provided Yes   Education Details stretching, exercise technique   Person(s) Educated Patient   Methods Explanation   Comprehension Verbalized understanding             PT Long Term Goals - 02/27/16 0927    PT LONG TERM GOAL #1   Title pt will have a decreased in LEFS score by >9 points to demonstrate improved overall function and increased ability to perform higher-level activities.   Baseline 46   Time 8   Period Weeks   Status New   PT LONG TERM GOAL #2   Title pt will have increased L gastroc/soleus muscle mass by >0.5 inches to decrease gait, squat, SLS deviations and demonstrate improved overall function.   Baseline 16.1" L, 16.75" R   Time 8   Period Weeks   Status New   PT LONG TERM GOAL #3   Title pt will be able to run for 0.5 miles <3/10 pain  in L achilles so pt can return to exercising regularly.   Baseline not running   Time 8   Period Weeks   Status New   PT LONG TERM GOAL #4   Title pt will be able to increase crossfit frequency to 4x/week with <3/10 pain in L achilles so pt can return to exercising regularly.   Baseline  2-3x/week crossfit (was going 4-5x/week)   Time 8   Period Weeks   Status New               Plan - 03/13/16 [id number]    Clinical Impression Statement pt continues to make progress as demonstrated by decreased pain and slightly improved function. pt continues to have soft tissue restrictions of gastroc/soleus and achilles tendon. pt educated to decrease amount of calf stretching he performs throughout the day due to amount of flexibility and to add more eccentric PF with knee bent and  straight to help strengthen gastroc/soleus more. PT performed heel lock taping of pt's L ankle to decrease amount of rearfoot motion during gait which pt tolerated well. SPT asked pt to bring in his custom orthotics and a different pair of shoes next treatment session to assess gait pattern in those. pt will benefit from posterior chain strengthening exercises to decrease amount of stress on achilles tendon during activity. pt needs continued skilled PT intervention to address remaining deficits in order to maximize overall function.   Rehab Potential Fair   Clinical Impairments Affecting Rehab Potential chronicity of issue, pain, bone spur identified, hx of DM   PT Frequency 2x / week   PT Duration 8 weeks   PT Treatment/Interventions ADLs/Self Care Home Management;Cryotherapy;Iontophoresis 4mg /ml Dexamethasone;Gait training;Stair training;Functional mobility training;Therapeutic exercise;Balance training;Neuromuscular re-education;Patient/family education;Manual techniques;Passive range of motion;Dry needling;Taping   PT Next Visit Plan review previous HEP, manual techniques, stretching, strengthening   PT Home Exercise Plan hip strengthening, stretching   Consulted and Agree with Plan of Care Patient      Patient will benefit from skilled therapeutic intervention in order to improve the following deficits and impairments:  Abnormal gait, Decreased balance, Decreased range of motion, Decreased strength, Difficulty walking, Improper body mechanics, Pain, Increased muscle spasms  Visit Diagnosis: Pain in left ankle and joints of left foot     Problem List Patient Active Problem List   Diagnosis Date Noted  . Plantar fasciitis 02/25/2016  . Achilles tendinitis 02/25/2016  . Diabetes mellitus type 2, controlled, without complications ([place]) [id number]  . Fatigue 01/02/2016  . GERD (gastroesophageal reflux disease) 01/02/2016  . Pharyngitis 10/10/2015  . Pyrexia 10/10/2015  . HBP (high  blood pressure) 06/27/2015  . DM (diabetes mellitus) ([place]) 06/27/2015  . Depression 06/27/2015  . Travel advice encounter 05/26/2015    [name], SPT This entire session was performed under direct supervision and direction of a licensed therapist/therapist assistant . I have personally read, edited and approve of the note as written. Gorden Harms. Tortorici, PT, DPT [phone]  Tortorici,Ashley 03/13/2016, 4:29 PM  [place] MAIN [facility] SERVICES [address] [place], [place], [address] Phone: [phone]   Fax:  [phone]  Name: MAKBEL WHITTUM MRN: [id number] Date of Birth: [birth year]

## 2016-03-18 ENCOUNTER — Ambulatory Visit: Payer: 59 | Attending: Family Medicine

## 2016-03-18 DIAGNOSIS — M25572 Pain in left ankle and joints of left foot: Secondary | ICD-10-CM | POA: Insufficient documentation

## 2016-03-18 NOTE — Therapy (Signed)
[place] MAIN [facility] SERVICES [address] [place], [place], [address] Phone: [phone]   Fax:  [phone]  Physical Therapy Treatment  Patient Details  Name: [name] MRN: [id number] Date of Birth: 30-Apr-1976 Referring Provider: Dr. Luan Pulling  Encounter Date: 03/18/2016      PT End of Session - 03/18/16 0835    Visit Number 6   Number of Visits 17   Date for PT Re-Evaluation 03/26/16   PT Start Time 0805   PT Stop Time 0845   PT Time Calculation (min) 40 min   Activity Tolerance Patient tolerated treatment well   Behavior During Therapy [facility] for tasks assessed/performed      Past Medical History  Diagnosis Date  . Hypertension   . Insomnia   . Seasonal allergies   . Diabetes ([place])   . GERD (gastroesophageal reflux disease)   . Plantar fasciitis     Past Surgical History  Procedure Laterality Date  . Clavide surgery  2005  . Shoulder surgery Right 2008  . Knee surgery Right 2013    There were no vitals filed for this visit.      Subjective Assessment - 03/18/16 0833    Subjective pt states that he rested a lot over the weekend so his calf has felt better. He stated that he liked the heel lock taping that was done last treatment session   Limitations Standing;Walking   How long can you walk comfortably? few hours   Patient Stated Goals decrease pain   Currently in Pain? Yes   Pain Score 1    Pain Location Calf   Pain Orientation Left   Pain Descriptors / Indicators Aching   Pain Onset More than a month ago      THEREX 2x15 eccentric PF (5 sec count down) with knee straight and bent; min verbal cues for technique  MANUAL Deep efflurage and IASTM with edge tool to L gastroc/soleus, achilles tendon x 15 min Patient received dry needling therapy education and acknowledged understanding of risks and benefits of dry needling therapy prior to receiving treatment. Patient voiced understanding of treatment options  and elected to proceed with dry needling therapy.   Trigger point dry needling performed by Caryl Pina C. Tortorici, PT, DPT [phone] to L calf twitch response elicited in most sites.  Heel lock tape of L ankle; pt educated to take tape off if it gets too tight       PT Education - 03/18/16 0835    Education provided Yes   Education Details exercise technique,    Person(s) Educated Patient   Methods Explanation   Comprehension Verbalized understanding             PT Long Term Goals - 02/27/16 0927    PT LONG TERM GOAL #1   Title pt will have a decreased in LEFS score by >9 points to demonstrate improved overall function and increased ability to perform higher-level activities.   Baseline 46   Time 8   Period Weeks   Status New   PT LONG TERM GOAL #2   Title pt will have increased L gastroc/soleus muscle mass by >0.5 inches to decrease gait, squat, SLS deviations and demonstrate improved overall function.   Baseline 16.1" L, 16.75" R   Time 8   Period Weeks   Status New   PT LONG TERM GOAL #3   Title pt will be able to run for 0.5 miles <3/10 pain in L achilles  so pt can return to exercising regularly.   Baseline not running   Time 8   Period Weeks   Status New   PT LONG TERM GOAL #4   Title pt will be able to increase crossfit frequency to 4x/week with <3/10 pain in L achilles so pt can return to exercising regularly.   Baseline  2-3x/week crossfit (was going 4-5x/week)   Time 8   Period Weeks   Status New               Plan - 03/18/16 [id number]    Clinical Impression Statement pt continues to make progress as his pain levels continue to remain low. He was compliant with modified HEP over the weekend of increasing eccentric exercises and decreasing stretch. He stated that his calf was not as tight as he expected it to be after not stretching it as much. pt still has soft tissue restrictions of L gastroc/soleus, medial/inversion instabiliity during gait, and L calf  weakness. pt forgot to bring in his orthotics and was reminded to bring them for next session to analyze gait in them. pt needs continued skilled PT intervention to address remaining deficits and maximize overall function.   Rehab Potential Fair   Clinical Impairments Affecting Rehab Potential chronicity of issue, pain, bone spur identified, hx of DM   PT Frequency 2x / week   PT Duration 8 weeks   PT Treatment/Interventions ADLs/Self Care Home Management;Cryotherapy;Iontophoresis 4mg /ml Dexamethasone;Gait training;Stair training;Functional mobility training;Therapeutic exercise;Balance training;Neuromuscular re-education;Patient/family education;Manual techniques;Passive range of motion;Dry needling;Taping   PT Next Visit Plan review previous HEP, manual techniques, stretching, strengthening   PT Home Exercise Plan hip strengthening, stretching   Consulted and Agree with Plan of Care Patient      Patient will benefit from skilled therapeutic intervention in order to improve the following deficits and impairments:  Abnormal gait, Decreased balance, Decreased range of motion, Decreased strength, Difficulty walking, Improper body mechanics, Pain, Increased muscle spasms  Visit Diagnosis: Pain in left ankle and joints of left foot     Problem List Patient Active Problem List   Diagnosis Date Noted  . Plantar fasciitis 02/25/2016  . Achilles tendinitis 02/25/2016  . Diabetes mellitus type 2, controlled, without complications ([place]) [id number]  . Fatigue 01/02/2016  . GERD (gastroesophageal reflux disease) 01/02/2016  . Pharyngitis 10/10/2015  . Pyrexia 10/10/2015  . HBP (high blood pressure) 06/27/2015  . DM (diabetes mellitus) ([place]) 06/27/2015  . Depression 06/27/2015  . Travel advice encounter 05/26/2015    [name], SPT This entire session was performed under direct supervision and direction of a licensed therapist/therapist assistant . I have personally read, edited and  approve of the note as written. Gorden Harms. Tortorici, PT, DPT [phone]  Tortorici,Ashley 03/18/2016, 11:06 AM  [place] MAIN [facility] SERVICES [address] [place], [place], [address] Phone: [phone]   Fax:  [phone]  Name: [name] MRN: [id number] Date of Birth: 01-26-1976

## 2016-03-20 ENCOUNTER — Ambulatory Visit: Payer: 59

## 2016-03-20 DIAGNOSIS — M25572 Pain in left ankle and joints of left foot: Secondary | ICD-10-CM

## 2016-03-20 NOTE — Therapy (Signed)
[place] MAIN [facility] SERVICES [address] [place], [place], [address] Phone: [phone]   Fax:  [phone]  Physical Therapy Treatment  Patient Details  Name: [name] MRN: [id number] Date of Birth: 03/25/1976 Referring Provider: Dr. Luan Pulling  Encounter Date: 03/20/2016      PT End of Session - 03/20/16 [id number]    Visit Number 7   Number of Visits 17   Date for PT Re-Evaluation 03/26/16   PT Start Time 0803   PT Stop Time 0845   PT Time Calculation (min) 42 min   Activity Tolerance Patient tolerated treatment well   Behavior During Therapy [facility] for tasks assessed/performed      Past Medical History  Diagnosis Date  . Hypertension   . Insomnia   . Seasonal allergies   . Diabetes ([place])   . GERD (gastroesophageal reflux disease)   . Plantar fasciitis     Past Surgical History  Procedure Laterality Date  . Clavide surgery  2005  . Shoulder surgery Right 2008  . Knee surgery Right 2013    There were no vitals filed for this visit.      Subjective Assessment - 03/20/16 0818    Subjective pt states that his L calf is continuing to feel better but that the pain will return if he is on his feet all day.   Limitations Standing;Walking   How long can you walk comfortably? few hours   Patient Stated Goals decrease pain   Currently in Pain? No/denies   Pain Onset More than a month ago       THEREX L single leg bridging with diaphragmatic breathing, 2x15; min verbal cues for proper breathing technique L eccentric PF (5 sec count down) with knee straight and knee bent, 2x15 each Bil lateral heel taps on 4" step, 2x15 each; min verbal cues for proper exercise technique Bil lunges with front foot on bosu 2x10; min verbal cues for exercise Bil stance on bosu x 3 min; min verbal cues or proper weight shifting Resisted walking fwd, retro, bil side stepping x 3 laps each with 25#  MANUAL Deep efflurage and trigger point ischemia  to L gastroc/soleus       PT Education - 03/20/16 0821    Education provided Yes   Education Details exercise technique; diaphragmatic breathing   Person(s) Educated Patient   Methods Explanation   Comprehension Verbalized understanding             PT Long Term Goals - 02/27/16 0927    PT LONG TERM GOAL #1   Title pt will have a decreased in LEFS score by >9 points to demonstrate improved overall function and increased ability to perform higher-level activities.   Baseline 46   Time 8   Period Weeks   Status New   PT LONG TERM GOAL #2   Title pt will have increased L gastroc/soleus muscle mass by >0.5 inches to decrease gait, squat, SLS deviations and demonstrate improved overall function.   Baseline 16.1" L, 16.75" R   Time 8   Period Weeks   Status New   PT LONG TERM GOAL #3   Title pt will be able to run for 0.5 miles <3/10 pain in L achilles so pt can return to exercising regularly.   Baseline not running   Time 8   Period Weeks   Status New   PT LONG TERM GOAL #4   Title pt will be able to increase  crossfit frequency to 4x/week with <3/10 pain in L achilles so pt can return to exercising regularly.   Baseline  2-3x/week crossfit (was going 4-5x/week)   Time 8   Period Weeks   Status New               Plan - 03/20/16 0848    Clinical Impression Statement pt making progress towards goals. His pain level at rest and with minimal activity has decreased but the pain returns after being on his feet all day. He also continues to present with soft tissue restrictions of L gastroc/soleus. pt tolerated progressed strengthening exercises today targeted at improving glute strength, however, pt still presents with weakness and fatigue of bil glutes. pt educated to continue HEP and perform self soft tissue mobilization or foam roll L gastroc/soleus over the weekend. pt needs continued skilled PT intervention to address remaining deficits in order to maximize overall  function.   Rehab Potential Fair   Clinical Impairments Affecting Rehab Potential chronicity of issue, pain, bone spur identified, hx of DM   PT Frequency 2x / week   PT Duration 8 weeks   PT Treatment/Interventions ADLs/Self Care Home Management;Cryotherapy;Iontophoresis 4mg /ml Dexamethasone;Gait training;Stair training;Functional mobility training;Therapeutic exercise;Balance training;Neuromuscular re-education;Patient/family education;Manual techniques;Passive range of motion;Dry needling;Taping   PT Next Visit Plan review previous HEP, manual techniques, stretching, strengthening   PT Home Exercise Plan hip strengthening, stretching   Consulted and Agree with Plan of Care Patient      Patient will benefit from skilled therapeutic intervention in order to improve the following deficits and impairments:  Abnormal gait, Decreased balance, Decreased range of motion, Decreased strength, Difficulty walking, Improper body mechanics, Pain, Increased muscle spasms  Visit Diagnosis: Pain in left ankle and joints of left foot     Problem List Patient Active Problem List   Diagnosis Date Noted  . Plantar fasciitis 02/25/2016  . Achilles tendinitis 02/25/2016  . Diabetes mellitus type 2, controlled, without complications ([place]) [id number]  . Fatigue 01/02/2016  . GERD (gastroesophageal reflux disease) 01/02/2016  . Pharyngitis 10/10/2015  . Pyrexia 10/10/2015  . HBP (high blood pressure) 06/27/2015  . DM (diabetes mellitus) ([place]) 06/27/2015  . Depression 06/27/2015  . Travel advice encounter 05/26/2015   [name], SPT This entire session was performed under direct supervision and direction of a licensed therapist/therapist assistant . I have personally read, edited and approve of the note as written. Gorden Harms. Tortorici, PT, DPT [id number]   Tortorici,Ashley 03/20/2016, 2:23 PM  [place] MAIN [facility] SERVICES [address] [place], [place],  [address] Phone: [phone]   Fax:  [phone]  Name: [name] MRN: [id number] Date of Birth: 06-24-1976

## 2016-03-21 ENCOUNTER — Ambulatory Visit
Admission: RE | Admit: 2016-03-21 | Discharge: 2016-03-21 | Disposition: A | Discharge status: Home / Self Care | Payer: 59 | Source: Ambulatory Visit | Attending: Podiatry | Admitting: Podiatry

## 2016-03-21 DIAGNOSIS — R52 Pain, unspecified: Secondary | ICD-10-CM | POA: Insufficient documentation

## 2016-03-21 DIAGNOSIS — M25572 Pain in left ankle and joints of left foot: Secondary | ICD-10-CM | POA: Diagnosis not present

## 2016-03-21 DIAGNOSIS — Q667 Congenital pes cavus: Secondary | ICD-10-CM | POA: Diagnosis not present

## 2016-03-23 ENCOUNTER — Encounter: Payer: Self-pay | Admitting: Emergency Medicine

## 2016-03-23 ENCOUNTER — Emergency Department: Payer: 59

## 2016-03-23 ENCOUNTER — Emergency Department
Admission: EM | Admit: 2016-03-23 | Discharge: 2016-03-23 | Disposition: A | Discharge status: Home / Self Care | Payer: 59 | Attending: Student | Admitting: Student

## 2016-03-23 DIAGNOSIS — Y999 Unspecified external cause status: Secondary | ICD-10-CM | POA: Diagnosis not present

## 2016-03-23 DIAGNOSIS — S8991XA Unspecified injury of right lower leg, initial encounter: Secondary | ICD-10-CM | POA: Diagnosis present

## 2016-03-23 DIAGNOSIS — E119 Type 2 diabetes mellitus without complications: Secondary | ICD-10-CM | POA: Insufficient documentation

## 2016-03-23 DIAGNOSIS — Z79899 Other long term (current) drug therapy: Secondary | ICD-10-CM | POA: Insufficient documentation

## 2016-03-23 DIAGNOSIS — Z7984 Long term (current) use of oral hypoglycemic drugs: Secondary | ICD-10-CM | POA: Insufficient documentation

## 2016-03-23 DIAGNOSIS — S82831A Other fracture of upper and lower end of right fibula, initial encounter for closed fracture: Secondary | ICD-10-CM | POA: Diagnosis not present

## 2016-03-23 DIAGNOSIS — I10 Essential (primary) hypertension: Secondary | ICD-10-CM | POA: Diagnosis not present

## 2016-03-23 DIAGNOSIS — F329 Major depressive disorder, single episode, unspecified: Secondary | ICD-10-CM | POA: Insufficient documentation

## 2016-03-23 DIAGNOSIS — Y92410 Unspecified street and highway as the place of occurrence of the external cause: Secondary | ICD-10-CM | POA: Diagnosis not present

## 2016-03-23 DIAGNOSIS — S6991XA Unspecified injury of right wrist, hand and finger(s), initial encounter: Secondary | ICD-10-CM | POA: Diagnosis not present

## 2016-03-23 DIAGNOSIS — S8251XA Displaced fracture of medial malleolus of right tibia, initial encounter for closed fracture: Secondary | ICD-10-CM | POA: Diagnosis not present

## 2016-03-23 DIAGNOSIS — M79644 Pain in right finger(s): Secondary | ICD-10-CM | POA: Diagnosis not present

## 2016-03-23 DIAGNOSIS — S63601A Unspecified sprain of right thumb, initial encounter: Secondary | ICD-10-CM | POA: Insufficient documentation

## 2016-03-23 DIAGNOSIS — S82444A Nondisplaced spiral fracture of shaft of right fibula, initial encounter for closed fracture: Secondary | ICD-10-CM | POA: Diagnosis not present

## 2016-03-23 DIAGNOSIS — Y9355 Activity, bike riding: Secondary | ICD-10-CM | POA: Insufficient documentation

## 2016-03-23 MED ORDER — KETOROLAC TROMETHAMINE 60 MG/2ML IM SOLN
30.0000 mg | Freq: Once | INTRAMUSCULAR | Status: AC
  Administered 2016-03-23: 30 mg via INTRAMUSCULAR
  Filled 2016-03-23: qty 2

## 2016-03-23 MED ORDER — HYDROCODONE-ACETAMINOPHEN 5-325 MG PO TABS: 1.0000 | ORAL_TABLET | Freq: Four times a day (QID) | ORAL | Status: DC | PRN

## 2016-03-23 MED ORDER — CYCLOBENZAPRINE HCL 5 MG PO TABS: 5.0000 mg | ORAL_TABLET | Freq: Three times a day (TID) | ORAL | Status: DC | PRN
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
## 2016-03-23 NOTE — ED Notes (Signed)
Patient transported to X-ray 
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
## 2016-03-25 ENCOUNTER — Telehealth: Payer: Self-pay | Admitting: *Deleted

## 2016-03-25 NOTE — Telephone Encounter (Addendum)
-----   Message from Trula Slade, DPM sent at 03/25/2016  7:10 AM EDT ----- Can you please send out his MRI for a second opinion to look at the plantar fascia? Informed pt of delay due to more indepth review of the MRI disc. Faxed requested [facility] Records for copy of MRI disc. 03/28/2016-Mailed copy of MRI disc to [facility].

## 2016-03-27 ENCOUNTER — Ambulatory Visit: Payer: 59

## 2016-03-27 DIAGNOSIS — S62291A Other fracture of first metacarpal bone, right hand, initial encounter for closed fracture: Secondary | ICD-10-CM | POA: Diagnosis not present

## 2016-03-27 DIAGNOSIS — S8254XA Nondisplaced fracture of medial malleolus of right tibia, initial encounter for closed fracture: Secondary | ICD-10-CM | POA: Diagnosis not present

## 2016-03-27 DIAGNOSIS — S93431A Sprain of tibiofibular ligament of right ankle, initial encounter: Secondary | ICD-10-CM | POA: Diagnosis not present

## 2016-03-28 ENCOUNTER — Other Ambulatory Visit: Payer: Self-pay | Admitting: Orthopedic Surgery

## 2016-03-28 DIAGNOSIS — S93431A Sprain of tibiofibular ligament of right ankle, initial encounter: Secondary | ICD-10-CM

## 2016-03-29 ENCOUNTER — Ambulatory Visit (HOSPITAL_COMMUNITY)
Admission: RE | Admit: 2016-03-29 | Discharge: 2016-03-29 | Disposition: A | Discharge status: Home / Self Care | Payer: 59 | Source: Ambulatory Visit | Attending: Orthopedic Surgery | Admitting: Orthopedic Surgery

## 2016-03-29 DIAGNOSIS — M25471 Effusion, right ankle: Secondary | ICD-10-CM | POA: Diagnosis not present

## 2016-03-29 DIAGNOSIS — S93431A Sprain of tibiofibular ligament of right ankle, initial encounter: Secondary | ICD-10-CM | POA: Insufficient documentation

## 2016-03-29 DIAGNOSIS — S96811A Strain of other specified muscles and tendons at ankle and foot level, right foot, initial encounter: Secondary | ICD-10-CM | POA: Diagnosis not present

## 2016-03-29 DIAGNOSIS — R600 Localized edema: Secondary | ICD-10-CM | POA: Diagnosis not present

## 2016-03-30 ENCOUNTER — Ambulatory Visit (HOSPITAL_COMMUNITY): Payer: No Typology Code available for payment source

## 2016-03-30 ENCOUNTER — Ambulatory Visit (HOSPITAL_COMMUNITY): Admission: RE | Admit: 2016-03-30 | Payer: No Typology Code available for payment source | Source: Ambulatory Visit

## 2016-04-04 ENCOUNTER — Encounter: Payer: Self-pay | Admitting: Family Medicine

## 2016-04-04 ENCOUNTER — Ambulatory Visit (INDEPENDENT_AMBULATORY_CARE_PROVIDER_SITE_OTHER): Payer: 59 | Admitting: Family Medicine

## 2016-04-04 VITALS — BP 130/85 | HR 77 | Temp 97.6°F | Resp 16 | Ht 73.0 in | Wt 259.0 lb

## 2016-04-04 DIAGNOSIS — I10 Essential (primary) hypertension: Secondary | ICD-10-CM

## 2016-04-04 DIAGNOSIS — K219 Gastro-esophageal reflux disease without esophagitis: Secondary | ICD-10-CM

## 2016-04-04 DIAGNOSIS — E119 Type 2 diabetes mellitus without complications: Secondary | ICD-10-CM

## 2016-04-04 MED ORDER — VALSARTAN-HYDROCHLOROTHIAZIDE 160-12.5 MG PO TABS: 1.0000 | ORAL_TABLET | Freq: Every day | ORAL | Status: DC
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
## 2016-04-09 ENCOUNTER — Encounter: Payer: Self-pay | Admitting: Podiatry

## 2016-04-09 ENCOUNTER — Ambulatory Visit (INDEPENDENT_AMBULATORY_CARE_PROVIDER_SITE_OTHER): Payer: 59 | Admitting: Podiatry

## 2016-04-09 DIAGNOSIS — M722 Plantar fascial fibromatosis: Secondary | ICD-10-CM

## 2016-04-09 DIAGNOSIS — M779 Enthesopathy, unspecified: Secondary | ICD-10-CM

## 2016-04-09 DIAGNOSIS — T148 Other injury of unspecified body region: Secondary | ICD-10-CM | POA: Diagnosis not present

## 2016-04-09 DIAGNOSIS — T148XXA Other injury of unspecified body region, initial encounter: Secondary | ICD-10-CM

## 2016-04-09 MED ORDER — ETODOLAC 500 MG PO TABS: 500.0000 mg | ORAL_TABLET | Freq: Two times a day (BID) | ORAL | 1 refills | Status: DC

## 2016-04-10 DIAGNOSIS — F4320 Adjustment disorder, unspecified: Secondary | ICD-10-CM | POA: Diagnosis not present

## 2016-04-10 DIAGNOSIS — S8254XD Nondisplaced fracture of medial malleolus of right tibia, subsequent encounter for closed fracture with routine healing: Secondary | ICD-10-CM | POA: Diagnosis not present

## 2016-04-10 NOTE — Progress Notes (Signed)
Subjective: 40 year old male presents the office in follow-up evaluation of left foot pain and to discuss MRI results. He states that since last appointment he has broken his right ankle he is under the care of another physician for this. He presents today on the left foot he states. He does in the left foot is somewhat better but it may be because he is to see more on the right side. Denies any systemic complaints such as fevers, chills, nausea, vomiting. No acute changes since last appointment, and no other complaints at this time.   Objective: AAO x3, NAD DP/PT pulses palpable bilaterally, CRT less than 3 seconds Tenderness to palpation along the plantar medial tubercle of the calcaneus at the insertion of plantar fascia on the left foot. There is mild pain along the course of the plantar fascia within the arch of the foot. Plantar fascia appears to be intact. No pain along the peroneal tendons. No pain along the lateral ankle and no gross instability. There is no pain with lateral compression of the calcaneus or pain with vibratory sensation. There is no pain along the course or insertion of the achilles tendon. No other areas of tenderness to bilateral lower extremities. No areas of pinpoint bony tenderness or pain with vibratory sensation. MMT 5/5, ROM WNL. Brace intact to the right lower extremity. He does not status of. No edema, erythema, increase in warmth to bilateral lower extremities.  No open lesions or pre-ulcerative lesions.  No pain with calf compression, swelling, warmth, erythema  Assessment: Left chronic foot pain  Plan: -All treatment options discussed with the patient including all alternatives, risks, complications.  -MRI results as well as the ovary to the MRI were discussed with the patient. -At this time he said multiple treatment options including orthotics, physical therapy, injections, shoe changes at any relief. Discussed with him other treatment options at this point  we will start with EPAT.  -Follow-up in 1 week to start the treatments.  -Patient encouraged to call the office with any questions, concerns, change in symptoms.   Celesta Gentile, DPM

## 2016-04-16 ENCOUNTER — Ambulatory Visit (INDEPENDENT_AMBULATORY_CARE_PROVIDER_SITE_OTHER): Payer: 59 | Admitting: Podiatry

## 2016-04-16 ENCOUNTER — Encounter: Payer: Self-pay | Admitting: Podiatry

## 2016-04-16 DIAGNOSIS — M722 Plantar fascial fibromatosis: Secondary | ICD-10-CM

## 2016-04-17 ENCOUNTER — Encounter: Payer: Self-pay | Admitting: Podiatry

## 2016-04-21 NOTE — Progress Notes (Signed)
Subjective: 40 year old male presents the office in follow-up evaluation of left foot pain. He states pain is about the exact same as what it has been. He denies any acute changes his last point. Continue to follow with orthopedics for his right ankle. Denies any systemic complaints such as fevers, chills, nausea, vomiting. No acute changes since last appointment, and no other complaints at this time.   Objective: AAO x3, NAD DP/PT pulses palpable bilaterally, CRT less than 3 seconds Tenderness to palpation along the plantar medial tubercle of the calcaneus at the insertion of plantar fascia on the left foot. There is mild pain along the course of the plantar fascia within the arch of the foot. Plantar fascia appears to be intact. No pain along the peroneal tendons. No pain along the lateral ankle and no gross instability. There is no pain with lateral compression of the calcaneus or pain with vibratory sensation. There is no pain along the course or insertion of the achilles tendon. No other areas of tenderness to bilateral lower extremities. Overall exam is unchanged.  No areas of pinpoint bony tenderness or pain with vibratory sensation. MMT 5/5, ROM WNL. Brace intact to the right lower extremity. He does not status of. No edema, erythema, increase in warmth to bilateral lower extremities.  No open lesions or pre-ulcerative lesions.  No pain with calf compression, swelling, warmth, erythema  Assessment: Left chronic foot pain  Plan: -All treatment options discussed with the patient including all alternatives, risks, complications.  -Discussed EPAT and wishes to proceed. He completed his first treatment today without complications. In the left Foot against 3.3, 1200, 16 on the Achilles tendon 1.6, 16, 1200, left arch 3.0, 15, 1200, left heel 3.3, 3000, 16. -Hold off on anti-inflammatories. -Follow-up in 1 week or sooner should any issues arise.  -Patient encouraged to call the office with any  questions, concerns, change in symptoms.   Celesta Gentile, DPM

## 2016-04-22 DIAGNOSIS — F4320 Adjustment disorder, unspecified: Secondary | ICD-10-CM | POA: Diagnosis not present

## 2016-04-23 ENCOUNTER — Encounter: Payer: Self-pay | Admitting: Podiatry

## 2016-04-23 ENCOUNTER — Ambulatory Visit (INDEPENDENT_AMBULATORY_CARE_PROVIDER_SITE_OTHER): Payer: 59 | Admitting: Podiatry

## 2016-04-23 DIAGNOSIS — M722 Plantar fascial fibromatosis: Secondary | ICD-10-CM | POA: Diagnosis not present

## 2016-04-23 DIAGNOSIS — M779 Enthesopathy, unspecified: Secondary | ICD-10-CM

## 2016-04-23 DIAGNOSIS — M7660 Achilles tendinitis, unspecified leg: Secondary | ICD-10-CM | POA: Diagnosis not present

## 2016-04-24 NOTE — Progress Notes (Signed)
Subjective: 40 year old male presents the office in follow-up evaluation of left foot pain and for his second EPAT treatment. He states that he had some pain the day after the first treatment in over the weekend his feet felt he states pretty good. He is still undergoing care with another physician for the right ankle. No new concerns today.  Objective: AAO x3, NAD DP/PT pulses palpable bilaterally, CRT less than 3 seconds There is continued but decreased tenderness to palpation along the plantar medial tubercle of the calcaneus at the insertion of plantar fascia on the left foot. There is mild pain along the course of the plantar fascia within the arch of the foot. Plantar fascia appears to be intact. No pain along the peroneal tendons. No pain along the lateral ankle and no gross instability. There is no pain with lateral compression of the calcaneus or pain with vibratory sensation. There is no pain along the course or insertion of the achilles tendon. No other areas of tenderness to bilateral lower extremities. Brace intact to the right lower extremity. He does not want to remove today.  No edema, erythema, increase in warmth to bilateral lower extremities.  No open lesions or pre-ulcerative lesions.  No pain with calf compression, swelling, warmth, erythema  Assessment: Left chronic foot pain  Plan: -All treatment options discussed with the patient including all alternatives, risks, complications.  -Discussed EPAT and wishes to proceed. He completed his 2nd treatment today without complications. In the left heel of 3.5, 18, 3000 arch 3.2, 1500, 18 achilles 1.8, 1700, 18 calf 3.5, 1700, 18. -Hold off on anti-inflammatories. -Follow-up in 1 week or sooner should any issues arise.  -Patient encouraged to call the office with any questions, concerns, change in symptoms.   Celesta Gentile, DPM

## 2016-04-30 ENCOUNTER — Encounter: Payer: Self-pay | Admitting: Podiatry

## 2016-04-30 ENCOUNTER — Ambulatory Visit (INDEPENDENT_AMBULATORY_CARE_PROVIDER_SITE_OTHER): Payer: 59 | Admitting: Podiatry

## 2016-04-30 DIAGNOSIS — M779 Enthesopathy, unspecified: Secondary | ICD-10-CM | POA: Diagnosis not present

## 2016-04-30 DIAGNOSIS — M722 Plantar fascial fibromatosis: Secondary | ICD-10-CM | POA: Diagnosis not present

## 2016-05-05 NOTE — Progress Notes (Signed)
Subjective: 40 year old male presents the office in follow-up evaluation of left foot pain and for his 3rd EPAT treatment.He feels that he is improving. He is still undergoing care with another physician for the right ankle. No new concerns today.  Objective: AAO x3, NAD DP/PT pulses palpable bilaterally, CRT less than 3 seconds There is mild but decreased tenderness to palpation along the plantar medial tubercle of the calcaneus at the insertion of plantar fascia on the left foot. There is minimal pain along the course of the plantar fascia within the arch of the foot. Plantar fascia appears to be intact. No pain along the peroneal tendons. No pain along the lateral ankle and no gross instability. There is no pain with lateral compression of the calcaneus or pain with vibratory sensation. There is no pain along the course or insertion of the achilles tendon. No other areas of tenderness to bilateral lower extremities. Brace intact to the right lower extremity. He does not want to remove today.  No edema, erythema, increase in warmth to bilateral lower extremities.  No open lesions or pre-ulcerative lesions.  No pain with calf compression, swelling, warmth, erythema  Assessment: Left chronic foot pain  Plan: -All treatment options discussed with the patient including all alternatives, risks, complications.  -Discussed EPAT and wishes to proceed. He completed his 3rd treatment today without complications. In the left heel of 3.7, 120, 3000 arch 3.5, 1700, 20  achilles 2.1, 2000, 20 calf 3.8, 2000, 20. -Hold off on anti-inflammatories. -Follow-up in 4 weeks or sooner should any issues arise.  -Patient encouraged to call the office with any questions, concerns, change in symptoms.   Celesta Gentile, DPM

## 2016-05-06 DIAGNOSIS — E119 Type 2 diabetes mellitus without complications: Secondary | ICD-10-CM | POA: Diagnosis not present

## 2016-05-08 DIAGNOSIS — F4320 Adjustment disorder, unspecified: Secondary | ICD-10-CM | POA: Diagnosis not present

## 2016-05-10 ENCOUNTER — Ambulatory Visit (INDEPENDENT_AMBULATORY_CARE_PROVIDER_SITE_OTHER): Payer: 59 | Admitting: Family Medicine

## 2016-05-10 ENCOUNTER — Encounter: Payer: Self-pay | Admitting: Family Medicine

## 2016-05-10 ENCOUNTER — Ambulatory Visit
Admission: RE | Admit: 2016-05-10 | Discharge: 2016-05-10 | Disposition: A | Discharge status: Home / Self Care | Payer: 59 | Source: Ambulatory Visit | Attending: Family Medicine | Admitting: Family Medicine

## 2016-05-10 ENCOUNTER — Telehealth: Payer: Self-pay | Admitting: Family Medicine

## 2016-05-10 DIAGNOSIS — M79644 Pain in right finger(s): Secondary | ICD-10-CM | POA: Diagnosis not present

## 2016-05-10 DIAGNOSIS — S6991XA Unspecified injury of right wrist, hand and finger(s), initial encounter: Secondary | ICD-10-CM | POA: Diagnosis not present
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
## 2016-05-10 NOTE — Patient Instructions (Signed)
Thank you for coming in to clinic today.  1. I think most likely this will resolve on it's own, and most likely related to sprain with some over use. However, we should re-investigate with imaging and consider possible scaphoid injury. I do not think any tendons are injured, your thumb seems stable. 2. Ordered x-rays for Thumb and Wrist - will call with results later today or Monday 3. Start using R thumb spica splint during work or strenuous activity, continue icing frequently, continue anti inflammatory, avoid over use  If not improving over next 2 weeks despite negative x-rays, then call us back and I will refer you to Hand Specialists for re-evaluation, may need additional specialized imaging.  Remember Flu Shot, call to schedule in September / October  If you have any other questions or concerns, please feel free to call the clinic or send a message through [place]. You may also schedule an earlier appointment if necessary.  [name], DO [place]

## 2016-05-10 NOTE — Telephone Encounter (Signed)
Seen by me earlier today 8/25 for R-thumb pain. See note for details.  Reviewed results of R-Thumb X-ray and R-Wrist + scaphoid series, both X-rays are negative for any fracture or dislocation.  As discussed, recommend patient to wear Thumb Spica Splint, continue icing, and notify us within next 2 weeks if significant worsening or not improving. We can consider referral to Hand Specialist at that time.  Called patient, discussed the above results.  [name], [facility] 05/10/2016, 10:08 AM
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
## 2016-05-13 DIAGNOSIS — E119 Type 2 diabetes mellitus without complications: Secondary | ICD-10-CM | POA: Diagnosis not present

## 2016-05-16 ENCOUNTER — Encounter: Payer: Self-pay | Admitting: Internal Medicine

## 2016-06-03 ENCOUNTER — Encounter: Payer: Self-pay | Admitting: Family Medicine

## 2016-06-05 DIAGNOSIS — F4320 Adjustment disorder, unspecified: Secondary | ICD-10-CM | POA: Diagnosis not present

## 2016-06-06 ENCOUNTER — Ambulatory Visit (INDEPENDENT_AMBULATORY_CARE_PROVIDER_SITE_OTHER): Payer: 59 | Admitting: Podiatry

## 2016-06-06 ENCOUNTER — Encounter: Payer: Self-pay | Admitting: Podiatry

## 2016-06-06 DIAGNOSIS — M722 Plantar fascial fibromatosis: Secondary | ICD-10-CM

## 2016-06-06 DIAGNOSIS — M7660 Achilles tendinitis, unspecified leg: Secondary | ICD-10-CM

## 2016-06-06 MED ORDER — ETODOLAC 500 MG PO TABS: 500.0000 mg | ORAL_TABLET | Freq: Two times a day (BID) | ORAL | 1 refills | Status: DC

## 2016-06-06 NOTE — Patient Instructions (Signed)

## 2016-06-11 NOTE — Progress Notes (Signed)
Subjective: 40 year old male presents the office they for follow-up evaluation bilateral heel pain. He states that on the heels are doing well she's having some pain in her back and points on the posterior aspect. He has not been taking any anti-inflammatories. He has not been stretching icing. He has been trying to wear supportive shoes. He has followed up with orthopedics the right ankle he is out of the brace and wearing a regular shoe. Denies any systemic complaints such as fevers, chills, nausea, vomiting. No acute changes since last appointment, and no other complaints at this time.   Objective: AAO x3, NAD DP/PT pulses palpable bilaterally, CRT less than 3 seconds There is tenderness to the posterior aspect of bilateral heels on the insertion of the Achilles tendon however minimal. There is no tenderness on the plantar medial tubercle of the calcaneus at the insertion the plantar fascia appears abnormal course of plantar fascial in the arch of the foot. There is no pain lateral compression of the calcaneus. No edema, erythema, increase in warmth. No other areas of tenderness bilateral. No open lesions or pre-ulcerative lesions.  No pain with calf compression, swelling, warmth, erythema  Assessment: Insertional Achilles tendinitis, posterior heel pain  Plan: -All treatment options discussed with the patient including all alternatives, risks, complications.  -Recommended return to stretching, icing exercises as well as continue supportive shoe gear and offloading pads. Refilled anti-inflammatoris today. Follow-up with symptoms not resolve or improve in the next 4-6 weeks or sooner if needed. In the meantime patient encouraged to call the office with any questions, concerns, change in symptoms.   Celesta Gentile, DPM

## 2016-08-02 ENCOUNTER — Other Ambulatory Visit: Payer: Self-pay | Admitting: Podiatry

## 2016-08-02 NOTE — Telephone Encounter (Signed)
Pt needs an appt prior to future refills. 

## 2016-08-30 ENCOUNTER — Ambulatory Visit: Payer: 59

## 2016-09-04 ENCOUNTER — Ambulatory Visit (INDEPENDENT_AMBULATORY_CARE_PROVIDER_SITE_OTHER): Payer: 59

## 2016-09-04 DIAGNOSIS — Z23 Encounter for immunization: Secondary | ICD-10-CM

## 2016-10-07 ENCOUNTER — Encounter: Payer: Self-pay | Admitting: *Deleted

## 2016-10-08 ENCOUNTER — Encounter: Payer: Self-pay | Admitting: Family Medicine

## 2016-10-08 ENCOUNTER — Ambulatory Visit (INDEPENDENT_AMBULATORY_CARE_PROVIDER_SITE_OTHER): Payer: 59 | Admitting: Family Medicine

## 2016-10-08 VITALS — BP 133/94 | HR 79 | Temp 97.8°F | Resp 16 | Ht 73.0 in | Wt 267.0 lb

## 2016-10-08 DIAGNOSIS — M1990 Unspecified osteoarthritis, unspecified site: Secondary | ICD-10-CM

## 2016-10-08 DIAGNOSIS — E119 Type 2 diabetes mellitus without complications: Secondary | ICD-10-CM

## 2016-10-08 DIAGNOSIS — M159 Polyosteoarthritis, unspecified: Secondary | ICD-10-CM | POA: Insufficient documentation

## 2016-10-08 DIAGNOSIS — K219 Gastro-esophageal reflux disease without esophagitis: Secondary | ICD-10-CM

## 2016-10-08 DIAGNOSIS — F3289 Other specified depressive episodes: Secondary | ICD-10-CM | POA: Diagnosis not present

## 2016-10-08 MED ORDER — ETODOLAC 500 MG PO TABS: 500.0000 mg | ORAL_TABLET | Freq: Two times a day (BID) | ORAL | 2 refills | Status: DC

## 2016-10-08 NOTE — Progress Notes (Signed)
Name: [name]   MRN: [id number]    DOB: 08-17-1976   Date:10/08/2016       Progress Note  Subjective  Chief Complaint  Chief Complaint  Patient presents with  . Hypertension    HPI Here for f/u of HBP.  Also has depression and feels less energized during the winter, but will wait another month to see if he improves with longer days.  His DM is controlled by Dr. Gabriel Carina.  He likes NSAIDSs for his arthritis esp in knees, but does not take it every day.  No problem-specific Assessment & Plan notes found for this encounter.   Past Medical History:  Diagnosis Date  . Diabetes ([place])   . GERD (gastroesophageal reflux disease)   . Hypertension   . Insomnia   . Plantar fasciitis   . Seasonal allergies     Past Surgical History:  Procedure Laterality Date  . clavide surgery  2005  . KNEE SURGERY Right 2013  . SHOULDER SURGERY Right 2008    Family History  Problem Relation Age of Onset  . Cancer Mother     colon/rectal  . Cancer Brother     h[place]kins  . Cancer Maternal Grandfather     lung  . Stroke Maternal Grandfather     Social History   Social History  . Marital status: Married    Spouse name: N/A  . Number of children: N/A  . Years of education: N/A   Occupational History  . Not on file.   Social History Main Topics  . Smoking status: Never Smoker  . Smokeless tobacco: Never Used  . Alcohol use 0.0 oz/week  . Drug use: No  . Sexual activity: Not on file   Other Topics Concern  . Not on file   Social History Narrative  . No narrative on file     Current Outpatient Prescriptions:  .  esomeprazole (NEXIUM) 40 MG capsule, Take 1 capsule (40 mg total) by mouth 2 (two) times daily before a meal., Disp: 180 capsule, Rfl: 3 .  FLUoxetine (PROZAC) 20 MG capsule, Take 1 capsule (20 mg total) by mouth daily., Disp: 90 capsule, Rfl: 3 .  glucose blood (ONE TOUCH ULTRA TEST) test strip, 1 each by Other route as directed., Disp: , Rfl:  .  ibuprofen  (ADVIL,MOTRIN) 200 MG tablet, Take 200 mg by mouth every 6 (six) hours as needed., Disp: , Rfl:  .  metFORMIN (GLUCOPHAGE-XR) 500 MG 24 hr tablet, Take 500 mg by mouth at bedtime. Taking 2000 at night, Disp: , Rfl: 4 .  Multiple Vitamin (MULTIVITAMIN) capsule, Take 1 capsule by mouth daily., Disp: , Rfl:  .  valsartan-hydrochlorothiazide (DIOVAN-HCT) 160-12.5 MG tablet, Take 1 tablet by mouth daily., Disp: 90 tablet, Rfl: 3 .  etodolac (LODINE) 500 MG tablet, Take 1 tablet (500 mg total) by mouth 2 (two) times daily., Disp: 60 tablet, Rfl: 2  Allergies  Allergen Reactions  . Tomato Other (See Comments)    GI distress     Review of Systems  Constitutional: Negative for chills, fever, malaise/fatigue and weight loss.  HENT: Negative for hearing loss and tinnitus.   Eyes: Negative for blurred vision and double vision.  Respiratory: Negative for cough, shortness of breath and wheezing.   Cardiovascular: Negative for chest pain, palpitations and leg swelling.  Gastrointestinal: Negative for abdominal pain, blood in stool and heartburn.  Genitourinary: Negative for dysuria, frequency and urgency.  Musculoskeletal: Positive for joint pain. Negative for myalgias.  Skin: Negative for rash.  Neurological: Negative for dizziness, tingling, tremors, weakness and headaches.  Psychiatric/Behavioral: Positive for depression (mildly worse.).      Objective  Vitals:   10/08/16 0823  BP: (!) 133/94  Pulse: 79  Resp: 16  Temp: 97.8 F (36.6 C)  TempSrc: Oral  Weight: 267 lb (121.1 kg)  Height: 6\' 1"  (1.854 m)    Physical Exam  Constitutional: He is oriented to person, place, and time and well-developed, well-nourished, and in no distress. No distress.  HENT:  Head: Normocephalic and atraumatic.  Eyes: Conjunctivae and EOM are normal. Pupils are equal, round, and reactive to light. No scleral icterus.  Neck: Normal range of motion. Neck supple. No thyromegaly present.  Cardiovascular:  Normal rate and regular rhythm.  Exam reveals no gallop and no friction rub.   No murmur heard. Pulmonary/Chest: Effort normal and breath sounds normal. No respiratory distress. He has no wheezes. He has no rales.  Abdominal: Soft. Bowel sounds are normal. He exhibits no distension and no mass. There is no tenderness.  Musculoskeletal: He exhibits no edema.  Lymphadenopathy:    He has no cervical adenopathy.  Neurological: He is alert and oriented to person, place, and time.  Vitals reviewed.      No results found for this or any previous visit (from the past 2160 hour(s)).   Assessment & Plan  Problem List Items Addressed This Visit      Digestive   GERD (gastroesophageal reflux disease) - Primary     Endocrine   Diabetes mellitus type 2, controlled, without complications (HCC)     Musculoskeletal and Integument   Arthritis   Relevant Medications   etodolac (LODINE) 500 MG tablet     Other   Depression      Meds ordered this encounter  Medications  . etodolac (LODINE) 500 MG tablet    Sig: Take 1 tablet (500 mg total) by mouth 2 (two) times daily.    Dispense:  60 tablet    Refill:  2   1. Gastroesophageal reflux disease without esophagitis Cont Omeprazole  2. Controlled type 2 diabetes mellitus without complication, without long-term current use of insulin (HCC) Cont meds from Dr. Gabriel Carina  3. Other depression Cont Prozac.  4. Arthritis  - etodolac (LODINE) 500 MG tablet; Take 1 tablet (500 mg total) by mouth 2 (two) times daily.  Dispense: 60 tablet; Refill: 2 - use prn only.

## 2016-10-14 ENCOUNTER — Ambulatory Visit (INDEPENDENT_AMBULATORY_CARE_PROVIDER_SITE_OTHER): Payer: 59 | Admitting: Family Medicine

## 2016-10-14 ENCOUNTER — Encounter: Payer: Self-pay | Admitting: Family Medicine

## 2016-10-14 ENCOUNTER — Telehealth: Payer: Self-pay | Admitting: *Deleted

## 2016-10-14 ENCOUNTER — Encounter: Payer: Self-pay | Admitting: *Deleted

## 2016-10-14 VITALS — BP 138/90 | HR 88 | Temp 98.2°F | Resp 16 | Ht 73.0 in | Wt 263.0 lb

## 2016-10-14 DIAGNOSIS — A084 Viral intestinal infection, unspecified: Secondary | ICD-10-CM | POA: Diagnosis not present

## 2016-10-14 MED ORDER — DICYCLOMINE HCL 10 MG PO CAPS: 10.0000 mg | ORAL_CAPSULE | Freq: Three times a day (TID) | ORAL | 0 refills | Status: DC

## 2016-10-14 MED ORDER — ONDANSETRON 4 MG PO TBDP: 4.0000 mg | ORAL_TABLET | Freq: Three times a day (TID) | ORAL | 0 refills | Status: DC | PRN

## 2016-10-14 NOTE — Progress Notes (Signed)
Subjective:    Patient ID: [name], male    DOB: Jun 01, 1976, 41 y.o.   MRN: [id number]  [name] is a 41 y.o. male presenting on 10/14/2016 for Nausea (vomitting, diarrhea onset 3 days cramps is worst and abdominal blaoting )  Patient presents for a same day appointment.  HPI  ABDOMINAL CRAMPING / NAUSEA / VOMITING / DIARRHEA: Reports symptoms started acutely about 3 days ago with generalized abdominal cramping (without any known trigger, not following meal, no known sick contacts with similar symptoms), thought to be gas, tried Gas-X OTC without any relief, several hours later developed nausea, vomiting, and diarrhea. Describes improvement in nausea, vomiting, still admits mild nausea but no further vomiting today (last episode last night, non bloody non bilious, several episodes for 48 hours), but still complains of persistent generalized abdominal pain with cramping, no clear worsening or improvement, seems mostly constant, has diarrhea every couple hours (loose mostly, non bloody). Tolerating some foods last night carrots and dumplings, keeps liquids down fine, has worsening nausea with food. - Admits associated fevers/chills (last dose 0700, NSAID, does alternate Tylenol / Ibuprofen), headaches - Reports he got influenza vaccine here 09/04/16, but not reflected on Epic chart - Denies any congestion, cough, sinus pain or pressure, ear pain, dyspnea, chest pain or pressure   Social History  Substance Use Topics  . Smoking status: Never Smoker  . Smokeless tobacco: Never Used  . Alcohol use 0.0 oz/week    Review of Systems Per HPI unless specifically indicated above     Objective:    BP 138/90 (BP Location: Left Arm, Cuff Size: Normal)   Pulse 88   Temp 98.2 F (36.8 C) (Oral)   Resp 16   Ht 6\' 1"  (1.854 m)   Wt 263 lb (119.3 kg)   SpO2 99%   BMI 34.70 kg/m   Wt Readings from Last 3 Encounters:  10/14/16 263 lb (119.3 kg)  10/08/16 267 lb (121.1 kg)    05/10/16 266 lb (120.7 kg)    Physical Exam  Constitutional: He appears well-developed and well-nourished. No distress.  Currently mildly tired appearing but overall well and non-toxic, comfortable, cooperative  HENT:  Head: Normocephalic and atraumatic.  Mouth/Throat: Oropharynx is clear and moist.  Nares patent without purulence or edema. Bilateral TMs clear without erythema, effusion or bulging. Oropharynx clear without erythema, exudates, edema or asymmetry.  Eyes: Conjunctivae are normal. Right eye exhibits no discharge. Left eye exhibits no discharge.  Neck: Normal range of motion. Neck supple.  Cardiovascular: Normal rate, regular rhythm, normal heart sounds and intact distal pulses.   No murmur heard. Pulmonary/Chest: Effort normal and breath sounds normal. No respiratory distress. He has no wheezes. He has no rales.  Abdominal: Soft. Bowel sounds are normal. He exhibits no distension and no mass. There is tenderness (Generalized bilateral lower quads, and mid to epigastric, R > L). There is no rebound and no guarding.  Negative Murphy's sign.  Musculoskeletal: He exhibits no edema.  Lymphadenopathy:    He has no cervical adenopathy.  Neurological: He is alert.  Skin: Skin is warm and dry. No rash noted. He is not diaphoretic. No erythema.  Psychiatric: His behavior is normal.  Nursing note and vitals reviewed.    I have personally reviewed the following lab results from 12/2015.  Results for orders placed or performed in visit on 01/02/16  Comprehensive Metabolic Panel (CMET)  Result Value Ref Range   Glucose 99 65 - 99 mg/dL  BUN 18 6 - 24 mg/dL   Creatinine, Ser 1.01 0.76 - 1.27 mg/dL   GFR calc non Af Amer 91 >59 mL/min/1.73   GFR calc Af Amer 106 >59 mL/min/1.73   BUN/Creatinine Ratio 18 9 - 20   Sodium 142 134 - 144 mmol/L   Potassium 4.4 3.5 - 5.2 mmol/L   Chloride 102 96 - 106 mmol/L   CO2 24 18 - 29 mmol/L   Calcium 9.6 8.7 - 10.2 mg/dL   Total Protein 6.8  6.0 - 8.5 g/dL   Albumin 4.7 3.5 - 5.5 g/dL   Globulin, Total 2.1 1.5 - 4.5 g/dL   Albumin/Globulin Ratio 2.2 1.2 - 2.2   Bilirubin Total 0.4 0.0 - 1.2 mg/dL   Alkaline Phosphatase 67 39 - 117 IU/L   AST 20 0 - 40 IU/L   ALT 26 0 - 44 IU/L  CBC with Differential  Result Value Ref Range   WBC 6.3 3.4 - 10.8 x10E3/uL   RBC 4.97 4.14 - 5.80 x10E6/uL   Hemoglobin 14.1 12.6 - 17.7 g/dL   Hematocrit 41.9 37.5 - 51.0 %   MCV 84 79 - 97 fL   MCH 28.4 26.6 - 33.0 pg   MCHC 33.7 31.5 - 35.7 g/dL   RDW 14.0 12.3 - 15.4 %   Platelets 260 150 - 379 x10E3/uL   Neutrophils 70 %   Lymphs 19 %   Monocytes 8 %   Eos 3 %   Basos 0 %   Neutrophils Absolute 4.4 1.4 - 7.0 x10E3/uL   Lymphocytes Absolute 1.2 0.7 - 3.1 x10E3/uL   Monocytes Absolute 0.5 0.1 - 0.9 x10E3/uL   EOS (ABSOLUTE) 0.2 0.0 - 0.4 x10E3/uL   Basophils Absolute 0.0 0.0 - 0.2 x10E3/uL   Immature Granulocytes 0 %   Immature Grans (Abs) 0.0 0.0 - 0.1 x10E3/uL  Lipid Profile  Result Value Ref Range   Cholesterol, Total 183 100 - 199 mg/dL   Triglycerides 119 0 - 149 mg/dL   HDL 52 >39 mg/dL   VLDL Cholesterol Cal 24 5 - 40 mg/dL   LDL Calculated 107 (H) 0 - 99 mg/dL   Chol/HDL Ratio 3.5 0.0 - 5.0 ratio units  TSH  Result Value Ref Range   TSH 1.440 0.450 - 4.500 uIU/mL      Assessment & Plan:   Problem List Items Addressed This Visit    None    Visit Diagnoses    Viral gastroenteritis    -  Primary  Consistent with viral gastroenteritis x 3 days symptoms diarrhea / vomiting, without known sick contact, considered stomach flu, similar symptoms, no other systemic or influenza like symptoms at this time. Interval improved vomiting, now some persistent diarrhea. Clinically well hydrated, no other focal findings of infection. - Unlikely to be focal abdominal etiology with mostly benign abdominal exam today, no high risk history for appendicitis or gallbladder disease. Clinically not consistent.  Plan: 1. Reassurance, likely  self-limited 7-10 days 2. Given rx Zofran 4mg  ODT q 8 hr PRN nausea / vomiting 3. Rx Dicyclomine 10mg  QID wc PRN abdominal cramping 4. Continue PO as tolerated. May try to increase hydration, clear fluids (gatorade, pedialyate, water)  5. Continue alternating Tylenol Ibuprofen PRN fever 6. Return criteria reviewed - advised when to follow-up in office vs ED if worsening, may need IVF     Relevant Medications   ondansetron (ZOFRAN ODT) 4 MG disintegrating tablet   dicyclomine (BENTYL) 10 MG capsule  Meds ordered this encounter  Medications  . ondansetron (ZOFRAN ODT) 4 MG disintegrating tablet    Sig: Take 1 tablet (4 mg total) by mouth every 8 (eight) hours as needed for nausea or vomiting.    Dispense:  30 tablet    Refill:  0  . dicyclomine (BENTYL) 10 MG capsule    Sig: Take 1 capsule (10 mg total) by mouth 4 (four) times daily -  before meals and at bedtime.    Dispense:  30 capsule    Refill:  0      Follow up plan: Return in about 1 week (around 10/21/2016), or if symptoms worsen or fail to improve, for viral gastro.  [name], [facility] 10/14/2016, 2:03 PM

## 2016-10-14 NOTE — Patient Instructions (Signed)
Thank you for coming in to clinic today.  1. Most likely viral gastroenteritis vs stomach flu - Start with symptomatic medications for Nausea and Cramping, take Zofran oral dissolving tab as needed for nausea, and Dicyclomine (Bentyl) as needed up to 4 times a day with meal or bedtime for cramping  If significant worsening with more focal abdominal pain, loss of appetite, persistent fevers/chills, return of vomiting, or other more serious concerns, follow-up sooner or may go to ED for re evaluation IV fluids and or imaging or other treatment.  Note I will be out of office thurs afternoon and Friday this week  Please schedule a follow-up appointment with Dr. Parks Ranger in 1-2 weeks as needed for viral gastro  If you have any other questions or concerns, please feel free to call the clinic or send a message through [place]. You may also schedule an earlier appointment if necessary.  Jon Putnam, DO [place]

## 2016-10-15 NOTE — Telephone Encounter (Signed)
No charge nurse visit flu injection.[place]

## 2016-10-25 ENCOUNTER — Ambulatory Visit (INDEPENDENT_AMBULATORY_CARE_PROVIDER_SITE_OTHER): Payer: 59 | Admitting: Internal Medicine

## 2016-10-25 DIAGNOSIS — Z298 Encounter for other specified prophylactic measures: Secondary | ICD-10-CM

## 2016-10-25 DIAGNOSIS — Z789 Other specified health status: Secondary | ICD-10-CM

## 2016-10-25 DIAGNOSIS — Z7189 Other specified counseling: Secondary | ICD-10-CM

## 2016-10-25 DIAGNOSIS — Z7184 Encounter for health counseling related to travel: Secondary | ICD-10-CM

## 2016-10-25 DIAGNOSIS — Z9189 Other specified personal risk factors, not elsewhere classified: Secondary | ICD-10-CM

## 2016-10-25 MED ORDER — ACETAZOLAMIDE 125 MG PO TABS
125.0000 mg | ORAL_TABLET | Freq: Two times a day (BID) | ORAL | 0 refills | Status: DC
Start: 2016-10-25 — End: 2017-03-12

## 2016-10-25 MED ORDER — AZITHROMYCIN 500 MG PO TABS: 500.0000 mg | ORAL_TABLET | Freq: Every day | ORAL | 0 refills | Status: DC
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
## 2016-11-06 DIAGNOSIS — E119 Type 2 diabetes mellitus without complications: Secondary | ICD-10-CM | POA: Diagnosis not present

## 2016-11-06 LAB — HEMOGLOBIN A1C: Hemoglobin A1C: 6.5

## 2016-11-13 DIAGNOSIS — E119 Type 2 diabetes mellitus without complications: Secondary | ICD-10-CM | POA: Diagnosis not present

## 2017-01-10 ENCOUNTER — Other Ambulatory Visit: Payer: Self-pay

## 2017-01-10 DIAGNOSIS — F32A Depression, unspecified: Secondary | ICD-10-CM

## 2017-01-10 DIAGNOSIS — F329 Major depressive disorder, single episode, unspecified: Secondary | ICD-10-CM

## 2017-01-10 MED ORDER — FLUOXETINE HCL 20 MG PO CAPS: 20.0000 mg | ORAL_CAPSULE | Freq: Every day | ORAL | 3 refills | Status: DC

## 2017-01-10 NOTE — Telephone Encounter (Signed)
Pharmacy requesting refill Last ov 10/14/16 Last filled 01/02/16 Please review. Thank you. sd

## 2017-01-22 DIAGNOSIS — H10413 Chronic giant papillary conjunctivitis, bilateral: Secondary | ICD-10-CM | POA: Diagnosis not present

## 2017-01-22 DIAGNOSIS — H04123 Dry eye syndrome of bilateral lacrimal glands: Secondary | ICD-10-CM | POA: Diagnosis not present

## 2017-01-22 DIAGNOSIS — E119 Type 2 diabetes mellitus without complications: Secondary | ICD-10-CM | POA: Diagnosis not present

## 2017-01-22 LAB — HM DIABETES EYE EXAM

## 2017-01-29 LAB — HM DIABETES FOOT EXAM

## 2017-02-05 DIAGNOSIS — F4320 Adjustment disorder, unspecified: Secondary | ICD-10-CM | POA: Diagnosis not present

## 2017-02-18 DIAGNOSIS — F4320 Adjustment disorder, unspecified: Secondary | ICD-10-CM | POA: Diagnosis not present

## 2017-02-26 ENCOUNTER — Telehealth: Payer: Self-pay | Admitting: Family Medicine

## 2017-02-26 ENCOUNTER — Other Ambulatory Visit: Payer: Self-pay

## 2017-02-26 DIAGNOSIS — K219 Gastro-esophageal reflux disease without esophagitis: Secondary | ICD-10-CM

## 2017-02-26 MED ORDER — ESOMEPRAZOLE MAGNESIUM 40 MG PO CPDR: 40.0000 mg | DELAYED_RELEASE_CAPSULE | Freq: Two times a day (BID) | ORAL | 0 refills | Status: DC

## 2017-02-26 NOTE — Telephone Encounter (Signed)
Rx send

## 2017-02-26 NOTE — Telephone Encounter (Signed)
Pt.called requesting a refill on nexium   [facility]. Pt. Call back  # is  [phone]

## 2017-03-10 ENCOUNTER — Ambulatory Visit: Payer: 59 | Admitting: Family Medicine

## 2017-03-12 ENCOUNTER — Other Ambulatory Visit: Payer: Self-pay | Admitting: Family Medicine

## 2017-03-12 ENCOUNTER — Encounter: Payer: Self-pay | Admitting: Family Medicine

## 2017-03-12 ENCOUNTER — Ambulatory Visit (INDEPENDENT_AMBULATORY_CARE_PROVIDER_SITE_OTHER): Payer: 59 | Admitting: Family Medicine

## 2017-03-12 VITALS — BP 122/86 | HR 84 | Ht 73.0 in | Wt 263.0 lb

## 2017-03-12 DIAGNOSIS — M150 Primary generalized (osteo)arthritis: Secondary | ICD-10-CM

## 2017-03-12 DIAGNOSIS — M1990 Unspecified osteoarthritis, unspecified site: Secondary | ICD-10-CM

## 2017-03-12 DIAGNOSIS — F3342 Major depressive disorder, recurrent, in full remission: Secondary | ICD-10-CM

## 2017-03-12 DIAGNOSIS — I10 Essential (primary) hypertension: Secondary | ICD-10-CM

## 2017-03-12 DIAGNOSIS — E1169 Type 2 diabetes mellitus with other specified complication: Secondary | ICD-10-CM

## 2017-03-12 DIAGNOSIS — E119 Type 2 diabetes mellitus without complications: Secondary | ICD-10-CM

## 2017-03-12 DIAGNOSIS — Z0000 Encounter for general adult medical examination without abnormal findings: Secondary | ICD-10-CM

## 2017-03-12 DIAGNOSIS — E785 Hyperlipidemia, unspecified: Secondary | ICD-10-CM

## 2017-03-12 DIAGNOSIS — R799 Abnormal finding of blood chemistry, unspecified: Secondary | ICD-10-CM

## 2017-03-12 DIAGNOSIS — K219 Gastro-esophageal reflux disease without esophagitis: Secondary | ICD-10-CM

## 2017-03-12 DIAGNOSIS — M159 Polyosteoarthritis, unspecified: Secondary | ICD-10-CM

## 2017-03-12 MED ORDER — VALSARTAN-HYDROCHLOROTHIAZIDE 160-12.5 MG PO TABS: 1.0000 | ORAL_TABLET | Freq: Every day | ORAL | 3 refills | Status: DC

## 2017-03-12 MED ORDER — ETODOLAC 500 MG PO TABS: 500.0000 mg | ORAL_TABLET | Freq: Two times a day (BID) | ORAL | 2 refills | Status: DC

## 2017-03-12 MED ORDER — ESOMEPRAZOLE MAGNESIUM 40 MG PO CPDR: 40.0000 mg | DELAYED_RELEASE_CAPSULE | Freq: Every day | ORAL | 3 refills | Status: DC

## 2017-03-12 NOTE — Assessment & Plan Note (Signed)
Stable, controlled on SSRI. Seems currently in remission Continue Fluoxetine 20mg  daily Follow-up

## 2017-03-12 NOTE — Assessment & Plan Note (Signed)
Well-controlled HTN No known complications    Plan:  1. Continue current BP regimen - Valsartan-HCTZ 160-12.5mg  daily 2. Encourage improved lifestyle - low sodium diet, regular exercise 3. Start monitor BP outside office, bring readings to next visit, if persistently >140/90 or new symptoms notify office sooner 4. Follow-up 3 mo for annual physical + labs, then likely q 6-12 months

## 2017-03-12 NOTE — Patient Instructions (Addendum)
Thank you for coming to the clinic today.  1. Refilled Nexium - reduced dose from 40mg  twice daily down to 40mg  once daily - as discussed, have to taper slowly, down from 2 daily to 1 only in morning 30 min before first meal.  Fill new rx when ready, cost should be less  Eventually goal to get down to 20mg  or 40mg  once daily in morning - possibly may be able to discontinue it and only use as needed in future.  - Avoid spicy, greasy, fried foods, also things like caffeine, dark chocolate, peppermint can worsen - Avoid large meals and late night snacks, also do not go more than 4-5 hours without a snack or meal (not eating will worsen reflux symptoms due to stomach acid)  If the problem improves but keeps coming back, we can discuss higher dose or longer course at next visit.  If symptoms are worsening, persistent symptoms despite treatment or develop esophageal or abdominal pain, unable to swallow solids or liquids, nausea, vomiting, fever/chills, or unintentional weight loss / no appetite, please follow-up sooner or seek more immediate medical attention.  Think about topical anti-inflammatory Diclofenac gel or Voltaren brand name - NSAID use 2-3 times daily on thumb or hand, no GI absorption and does not cause gastritis or acid reflux worsening. We can change to this next time if not improving on current therapy  Please schedule a Follow-up Appointment to: Return in about 3 months (around 06/12/2017) for Annual Physical.  If you have any other questions or concerns, please feel free to call the clinic or send a message through [place]. You may also schedule an earlier appointment if necessary.  Additionally, you may be receiving a survey about your experience at our clinic within a few days to 1 week by e-mail or mail. We value your feedback.  [name], DO [place]

## 2017-03-12 NOTE — Assessment & Plan Note (Signed)
Suspected primary etiology of R thumb pain and stiffness, among other joint pains. No significant deformity, limited function. - Improved on regular NSAID, Etolodac  Plan: 1. Given concern with chronic GERD and plan to reduce PPI, caution with chronic NSAID - agree to refill Etolodac today but consider future alternatives, such as topical diclofenac gel for hand/thumb, defer new rx at this time, re-consider in 3 months 2. Regular Tylenol, moist heat, RICE therapy 3. Follow-up PRN

## 2017-03-12 NOTE — Assessment & Plan Note (Signed)
Stable controlled chronic GERD without esophagitis on prior EGD or other complication. On high dose PPI nexium 40 BID chronically per prior provider - No GI red flag symptoms. Exam is unremarkable - History not suggestive of PUD (despite regular NSAID use)  Plan: 1. Discussion on GERD chronic management, PPI risk/reward, complications 2. Agree to refill Nexium - reduced dose to 40mg  daily, advised slow taper from 40 BID to 40mg  daily avoid hypersecretion and rebound symptoms of GERD - future consider down from 40 to 20 3. Diet modifications reduce GERD 4. F/u 3 mo
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
## 2017-04-30 DIAGNOSIS — G5601 Carpal tunnel syndrome, right upper limb: Secondary | ICD-10-CM | POA: Diagnosis not present

## 2017-05-08 DIAGNOSIS — E119 Type 2 diabetes mellitus without complications: Secondary | ICD-10-CM | POA: Diagnosis not present

## 2017-05-08 LAB — HEMOGLOBIN A1C: Hemoglobin A1C: 6.3

## 2017-05-14 DIAGNOSIS — E119 Type 2 diabetes mellitus without complications: Secondary | ICD-10-CM | POA: Diagnosis not present

## 2017-05-14 DIAGNOSIS — I10 Essential (primary) hypertension: Secondary | ICD-10-CM | POA: Diagnosis not present

## 2017-06-03 DIAGNOSIS — G5603 Carpal tunnel syndrome, bilateral upper limbs: Secondary | ICD-10-CM | POA: Diagnosis not present

## 2017-06-23 DIAGNOSIS — R799 Abnormal finding of blood chemistry, unspecified: Secondary | ICD-10-CM | POA: Diagnosis not present

## 2017-06-23 DIAGNOSIS — E785 Hyperlipidemia, unspecified: Secondary | ICD-10-CM | POA: Diagnosis not present

## 2017-06-23 DIAGNOSIS — I10 Essential (primary) hypertension: Secondary | ICD-10-CM | POA: Diagnosis not present

## 2017-06-23 DIAGNOSIS — E1169 Type 2 diabetes mellitus with other specified complication: Secondary | ICD-10-CM | POA: Diagnosis not present

## 2017-06-23 DIAGNOSIS — Z0000 Encounter for general adult medical examination without abnormal findings: Secondary | ICD-10-CM | POA: Diagnosis not present

## 2017-06-24 ENCOUNTER — Ambulatory Visit (INDEPENDENT_AMBULATORY_CARE_PROVIDER_SITE_OTHER): Payer: 59 | Admitting: Family Medicine

## 2017-06-24 ENCOUNTER — Encounter: Payer: Self-pay | Admitting: Family Medicine

## 2017-06-24 VITALS — BP 130/88 | HR 80 | Temp 98.4°F | Resp 16 | Ht 73.0 in | Wt 256.0 lb

## 2017-06-24 DIAGNOSIS — M159 Polyosteoarthritis, unspecified: Secondary | ICD-10-CM

## 2017-06-24 DIAGNOSIS — M150 Primary generalized (osteo)arthritis: Secondary | ICD-10-CM | POA: Diagnosis not present

## 2017-06-24 DIAGNOSIS — Z23 Encounter for immunization: Secondary | ICD-10-CM

## 2017-06-24 DIAGNOSIS — Z0000 Encounter for general adult medical examination without abnormal findings: Secondary | ICD-10-CM

## 2017-06-24 DIAGNOSIS — J3089 Other allergic rhinitis: Secondary | ICD-10-CM | POA: Diagnosis not present

## 2017-06-24 DIAGNOSIS — E1169 Type 2 diabetes mellitus with other specified complication: Secondary | ICD-10-CM | POA: Diagnosis not present

## 2017-06-24 DIAGNOSIS — E119 Type 2 diabetes mellitus without complications: Secondary | ICD-10-CM

## 2017-06-24 DIAGNOSIS — J302 Other seasonal allergic rhinitis: Secondary | ICD-10-CM | POA: Insufficient documentation

## 2017-06-24 DIAGNOSIS — E785 Hyperlipidemia, unspecified: Secondary | ICD-10-CM | POA: Diagnosis not present

## 2017-06-24 DIAGNOSIS — I10 Essential (primary) hypertension: Secondary | ICD-10-CM | POA: Diagnosis not present

## 2017-06-24 DIAGNOSIS — F3342 Major depressive disorder, recurrent, in full remission: Secondary | ICD-10-CM

## 2017-06-24 LAB — CBC WITH DIFFERENTIAL/PLATELET
BASOS ABS: 0 10*3/uL (ref 0.0–0.2)
Basos: 1 %
EOS (ABSOLUTE): 0.2 10*3/uL (ref 0.0–0.4)
Eos: 3 %
HEMOGLOBIN: 14.6 g/dL (ref 13.0–17.7)
Hematocrit: 41.6 % (ref 37.5–51.0)
IMMATURE GRANS (ABS): 0 10*3/uL (ref 0.0–0.1)
Immature Granulocytes: 0 %
LYMPHS: 24 %
Lymphocytes Absolute: 1.6 10*3/uL (ref 0.7–3.1)
MCH: 28.7 pg (ref 26.6–33.0)
MCHC: 35.1 g/dL (ref 31.5–35.7)
MCV: 82 fL (ref 79–97)
MONOCYTES: 7 %
Monocytes Absolute: 0.5 10*3/uL (ref 0.1–0.9)
Neutrophils Absolute: 4.5 10*3/uL (ref 1.4–7.0)
Neutrophils: 65 %
PLATELETS: 250 10*3/uL (ref 150–379)
RBC: 5.08 x10E6/uL (ref 4.14–5.80)
RDW: 12.7 % (ref 12.3–15.4)
WBC: 6.9 10*3/uL (ref 3.4–10.8)

## 2017-06-24 LAB — COMPREHENSIVE METABOLIC PANEL
ALT: 32 IU/L (ref 0–44)
AST: 21 IU/L (ref 0–40)
Albumin/Globulin Ratio: 2.1 (ref 1.2–2.2)
Albumin: 4.6 g/dL (ref 3.5–5.5)
Alkaline Phosphatase: 66 IU/L (ref 39–117)
BUN/Creatinine Ratio: 17 (ref 9–20)
BUN: 17 mg/dL (ref 6–24)
Bilirubin Total: 0.2 mg/dL (ref 0.0–1.2)
CO2: 23 mmol/L (ref 20–29)
CREATININE: 1.03 mg/dL (ref 0.76–1.27)
Calcium: 9.8 mg/dL (ref 8.7–10.2)
Chloride: 99 mmol/L (ref 96–106)
GFR calc Af Amer: 102 mL/min/{1.73_m2} (ref >59)
GFR calc non Af Amer: 89 mL/min/{1.73_m2} (ref >59)
Globulin, Total: 2.2 g/dL (ref 1.5–4.5)
Glucose: 112 mg/dL — ABNORMAL HIGH (ref 65–99)
Potassium: 4.3 mmol/L (ref 3.5–5.2)
Sodium: 138 mmol/L (ref 134–144)
Total Protein: 6.8 g/dL (ref 6.0–8.5)

## 2017-06-24 LAB — LIPID PANEL
Chol/HDL Ratio: 3.5 ratio (ref 0.0–5.0)
Cholesterol, Total: 177 mg/dL (ref 100–199)
HDL: 51 mg/dL (ref >39)
LDL CALC: 104 mg/dL — AB (ref 0–99)
TRIGLYCERIDES: 109 mg/dL (ref 0–149)
VLDL CHOLESTEROL CAL: 22 mg/dL (ref 5–40)

## 2017-06-24 MED ORDER — FLUTICASONE PROPIONATE 50 MCG/ACT NA SUSP: 2.0000 | Freq: Every day | NASAL | 3 refills | Status: DC

## 2017-06-24 MED ORDER — IPRATROPIUM BROMIDE 0.06 % NA SOLN: 2.0000 | Freq: Four times a day (QID) | NASAL | 0 refills | Status: DC

## 2017-06-24 NOTE — Assessment & Plan Note (Signed)
Controlled cholesterol on lifestyle Last lipid panel 06/2017 Calculated ASCVD 10 yr risk score 3.6%, low risk despite DM  Plan: 1. Discussion on reducing ASCVD risk - defer additional rx meds for now 2. Encourage improved lifestyle - low carb/cholesterol, reduce portion size, continue improving regular exercise 3. Follow-up q 1 yr lipids

## 2017-06-24 NOTE — Patient Instructions (Addendum)
Thank you for coming to the clinic today.  1. Great job. Labs are excellent overall.  Keep working on regular exercise  2. Flu Shot today  3. Ask Dr Gabriel Carina about the Pneumonia Vaccine (Pneumovax-23) due for diabetic patients under age 43  4. I will look into Valsartan more to see if we need to change anything, I will reach out to you about this in future.  5. For sinus congestion / allergies - Start Atrovent nasal spray decongestant 2 sprays in each nostril up to 4 times daily for 7 days - After 1 week if improved then switch to nasal steroid. Start nasal steroid Flonase 2 sprays in each nostril daily for 4-6 weeks, may repeat course seasonally or as needed   Please schedule a Follow-up Appointment to: Return in about 6 months (around 12/23/2017) for HTN, DM2, Depression PHQ.  If you have any other questions or concerns, please feel free to call the clinic or send a message through [place]. You may also schedule an earlier appointment if necessary.  Additionally, you may be receiving a survey about your experience at our clinic within a few days to 1 week by e-mail or mail. We value your feedback.  [name], DO [facility]

## 2017-06-24 NOTE — Progress Notes (Signed)
Subjective:    Patient ID: [name], male    DOB: 08-25-1976, 41 y.o.   MRN: [id number]  [name] is a 41 y.o. male presenting on 06/24/2017 for Annual Exam (last A1c 05/08/17 --6.3 %)   HPI   CHRONIC DM, Type 2: Followed by [facility] Endocrinology Dr Gabriel Carina q 6 months - last 6.3 (04/2017) Meds: Metformin 2000mg  daily XR Reports good compliance. Tolerating well w/o side-effects Currently on ARB Lifestyle: - Diet (Improved diet)  - Exercise (plans to improve walking and return to bicycle and gym exercise)  CHRONIC HTN: Reports no concerns, except usually elevated BP if rushing or stressed, today had stressful morning difficulty with finding drivers available at work Current Meds - Valsartan-HCTZ 160-12.5mg  daily - Has questions regarding recent Valsartan recall Reports good compliance, took meds today. Tolerating well, w/o complaints.  Seasonal Allergies / Allergic Rhinitis - Reports chronic intermittent seasonal problem, now with sinus congestion and pressure. Asking about taking oral decongestant. Has no nose sprays and none used recently  HYPERLIPIDEMIA: - Reports no concerns. Last lipid panel 06/2017, controlled - Not on statin  History of Depression, now in remission - Reports chronic problem has episodic flares, usually related to certain life stressors, had been doing very well on previous Fluoxetine 20mg  daily. Recently this summer had a "low spell" and followed up with Psychiatry Dr Chucky May, [facility], as needed once yearly, at that time he was continued on FLuoxetine and added Wellbutrin XL 150mg  daily - He is doing well on this med regimen  Health Maintenance: - Due for Flu Shot, will receive today  Depression screen [facility] 2/9 06/24/2017 03/12/2017 10/08/2016  Decreased Interest 0 0 0  Down, Depressed, Hopeless 0 0 0  PHQ - 2 Score 0 0 0  Altered sleeping 1 0 -  Tired, decreased energy 1 1 -  Change in appetite 0 0 -  Feeling bad or failure  about yourself  0 - -  Trouble concentrating 0 0 -  Moving slowly or fidgety/restless 0 0 -  Suicidal thoughts 0 0 -  PHQ-9 Score 2 1 -  Difficult doing work/chores Not difficult at all - -    Past Medical History:  Diagnosis Date  . Diabetes ([place])   . GERD (gastroesophageal reflux disease)   . Hypertension   . Insomnia   . Plantar fasciitis   . Seasonal allergies    Past Surgical History:  Procedure Laterality Date  . clavide surgery  2005  . KNEE SURGERY Right 2013  . SHOULDER SURGERY Right 2008   Social History   Social History  . Marital status: Married    Spouse name: N/A  . Number of children: N/A  . Years of education: N/A   Occupational History  . 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 Smoking status: Never Smoker  . Smokeless tobacco: Never Used  . Alcohol use 0.0 oz/week  . Drug use: No  . Sexual activity: Not on file   Other Topics Concern  . Not on file   Social History Narrative  . No narrative on file   Family History  Problem Relation Age of Onset  . Cancer Mother        colon/rectal  . Cancer Brother        hodgekins  . Cancer Maternal Grandfather        lung  . Stroke Maternal Grandfather  Current Outpatient Prescriptions on File Prior to Visit  Medication Sig  . buPROPion (WELLBUTRIN XL) 150 MG 24 hr tablet Take 150 mg by mouth daily.  [name] etodolac (LODINE) 500 MG tablet Take 1 tablet (500 mg total) by mouth 2 (two) times daily. (Patient taking differently: Take 500 mg by mouth daily as needed (for pain). )  . FLUoxetine (PROZAC) 20 MG capsule Take 1 capsule (20 mg total) by mouth daily.  . metFORMIN (GLUCOPHAGE-XR) 500 MG 24 hr tablet Take 2,000 mg by mouth at bedtime.   . Multiple Vitamin (MULTIVITAMIN) capsule Take 1 capsule by mouth daily.  . valsartan-hydrochlorothiazide (DIOVAN-HCT) 160-12.5 MG tablet Take 1 tablet by mouth daily.   No current facility-administered  medications on file prior to visit.     Review of Systems  Constitutional: Negative for activity change, appetite change, chills, diaphoresis, fatigue, fever and unexpected weight change.  HENT: Negative for congestion and hearing loss.   Eyes: Negative for visual disturbance.  Respiratory: Negative for apnea, cough, choking, chest tightness, shortness of breath and wheezing.   Cardiovascular: Negative for chest pain, palpitations and leg swelling.  Gastrointestinal: Negative for abdominal pain, anal bleeding, blood in stool, constipation, diarrhea, nausea and vomiting.  Endocrine: Negative for cold intolerance and polyuria.  Genitourinary: Negative for decreased urine volume, difficulty urinating, dysuria, frequency, hematuria and testicular pain.  Musculoskeletal: Positive for arthralgias (R thumb). Negative for neck pain.  Skin: Negative for rash.  Allergic/Immunologic: Negative for environmental allergies.  Neurological: Negative for dizziness, weakness, light-headedness, numbness and headaches.  Hematological: Negative for adenopathy.  Psychiatric/Behavioral: Negative for behavioral problems, dysphoric mood (Resolved) and sleep disturbance. The patient is not nervous/anxious.    Per HPI unless specifically indicated above     Objective:    BP 130/88 (BP Location: Left Arm, Cuff Size: Normal)   Pulse 80   Temp 98.4 F (36.9 C) (Oral)   Resp 16   Ht 6\' 1"  (1.854 m)   Wt 256 lb (116.1 kg)   BMI 33.78 kg/m   Wt Readings from Last 3 Encounters:  06/24/17 256 lb (116.1 kg)  03/12/17 263 lb (119.3 kg)  10/14/16 263 lb (119.3 kg)    Physical Exam  Constitutional: He is oriented to person, place, and time. He appears well-developed and well-nourished. No distress.  Well-appearing, comfortable, cooperative  HENT:  Head: Normocephalic and atraumatic.  Mouth/Throat: Oropharynx is clear and moist.  Eyes: Pupils are equal, round, and reactive to light. Conjunctivae and EOM are  normal. Right eye exhibits no discharge. Left eye exhibits no discharge.  Neck: Normal range of motion. Neck supple. No thyromegaly present.  Cardiovascular: Normal rate, regular rhythm, normal heart sounds and intact distal pulses.   No murmur heard. Pulmonary/Chest: Effort normal and breath sounds normal. No respiratory distress. He has no wheezes. He has no rales.  Abdominal: Soft. Bowel sounds are normal. He exhibits no distension and no mass. There is no tenderness.  Musculoskeletal: Normal range of motion. He exhibits no edema or tenderness.  Upper / Lower Extremities: - Normal muscle tone, strength bilateral upper extremities 5/5, lower extremities 5/5  Lymphadenopathy:    He has no cervical adenopathy.  Neurological: He is alert and oriented to person, place, and time.  Distal sensation intact to light touch all extremities  Skin: Skin is warm and dry. No rash noted. He is not diaphoretic. No erythema.  Psychiatric: He has a normal mood and affect. His behavior is normal.  Well groomed, good eye contact, normal  speech and thoughts  Nursing note and vitals reviewed.  Results for orders placed or performed in visit on 06/24/17  Hemoglobin A1c  Result Value Ref Range   Hemoglobin A1C 6.3       Assessment & Plan:   Problem List Items Addressed This Visit    Depression    Stable, controlled. Seems currently in remission Recent flare in summer, now resolved on Wellbutrin adjunct therapy as well Continue Fluoxetine 20mg  daily, Wellbutrin XL 150mg  daily Followed by Psychiatry Dr Toy Care in [place]      Diabetes mellitus type 2, controlled, without complications ([place])    Well-controlled DM with A1c 6.3 No known complications or hypoglycemia. Followed by [facility] Endocrinology Dr Gabriel Carina  Plan:  1. Continue current therapy - Metformin XR 2000mg  daily 2. Encourage improved lifestyle - low carb, low sugar diet, reduce portion size, continue improving regular exercise      Essential  hypertension    Well-controlled HTN No known complications   Plan:  1. Continue current BP regimen - Valsartan-HCTZ 160-12.5mg  daily - Regarding patient's concern with recent valsartan recall, suspect risk is low and will need to determine if his med was one that was affected, will reach out to him as well and review further 2. Encourage improved lifestyle - low sodium diet, regular exercise 3. Start monitor BP outside office, bring readings to next visit, if persistently >140/90 or new symptoms notify office sooner 4. Follow-up 6 months      Hyperlipidemia associated with type 2 diabetes mellitus ([place])    Controlled cholesterol on lifestyle Last lipid panel 06/2017 Calculated ASCVD 10 yr risk score 3.6%, low risk despite DM  Plan: 1. Discussion on reducing ASCVD risk - defer additional rx meds for now 2. Encourage improved lifestyle - low carb/cholesterol, reduce portion size, continue improving regular exercise 3. Follow-up q 1 yr lipids       Osteoarthritis of multiple joints    Followed by [facility] Awaiting future carpal tunnel release      Seasonal allergic rhinitis    Consistent with seasonal allergic rhinitis - Start Atrovent nasal spray decongestant 2 sprays in each nostril up to 4 times daily for 7 days - Then if improved start nasal steroid Flonase 2 sprays in each nostril daily for 4-6 weeks, may repeat course seasonally or as needed       Relevant Medications   ipratropium (ATROVENT) 0.06 % nasal spray   fluticasone (FLONASE) 50 MCG/ACT nasal spray    Other Visit Diagnoses    Annual physical exam    -  Primary   Needs flu shot       Relevant Orders   Flu Vaccine QUAD 36+ mos IM (Completed)      Meds ordered this encounter  Medications  . DISCONTD: esomeprazole (NEXIUM) 40 MG capsule    Sig: Take by mouth.  . esomeprazole (NEXIUM) 40 MG capsule    Sig: Take 40 mg by mouth daily at 12 noon.  [name] ipratropium (ATROVENT) 0.06 % nasal spray    Sig: Place 2  sprays into both nostrils 4 (four) times daily. For up to 5-7 days then stop.    Dispense:  15 mL    Refill:  0  . fluticasone (FLONASE) 50 MCG/ACT nasal spray    Sig: Place 2 sprays into both nostrils daily. Use for 4-6 weeks then stop and use seasonally or as needed.    Dispense:  16 g    Refill:  3  Follow up plan: Return in about 6 months (around 12/23/2017) for HTN, DM2, Depression PHQ.  [name], [name] 06/24/2017, 11:30 PM

## 2017-06-24 NOTE — Assessment & Plan Note (Signed)
Well-controlled DM with A1c 6.3 No known complications or hypoglycemia. Followed by [facility] Endocrinology Dr Gabriel Carina  Plan:  1. Continue current therapy - Metformin XR 2000mg  daily 2. Encourage improved lifestyle - low carb, low sugar diet, reduce portion size, continue improving regular exercise

## 2017-06-24 NOTE — Assessment & Plan Note (Signed)
Consistent with seasonal allergic rhinitis - Start Atrovent nasal spray decongestant 2 sprays in each nostril up to 4 times daily for 7 days - Then if improved start nasal steroid Flonase 2 sprays in each nostril daily for 4-6 weeks, may repeat course seasonally or as needed

## 2017-06-24 NOTE — Assessment & Plan Note (Signed)
Followed by [facility] Ortho Awaiting future carpal tunnel release

## 2017-06-24 NOTE — Assessment & Plan Note (Signed)
Well-controlled HTN No known complications   Plan:  1. Continue current BP regimen - Valsartan-HCTZ 160-12.5mg  daily - Regarding patient's concern with recent valsartan recall, suspect risk is low and will need to determine if his med was one that was affected, will reach out to him as well and review further 2. Encourage improved lifestyle - low sodium diet, regular exercise 3. Start monitor BP outside office, bring readings to next visit, if persistently >140/90 or new symptoms notify office sooner 4. Follow-up 6 months

## 2017-06-24 NOTE — Assessment & Plan Note (Signed)
Stable, controlled. Seems currently in remission Recent flare in summer, now resolved on Wellbutrin adjunct therapy as well Continue Fluoxetine 20mg  daily, Wellbutrin XL 150mg  daily Followed by Psychiatry Dr Toy Care in [place]

## 2017-06-25 ENCOUNTER — Encounter: Payer: Self-pay | Admitting: Family Medicine

## 2017-06-26 ENCOUNTER — Encounter
Admission: RE | Admit: 2017-06-26 | Discharge: 2017-06-26 | Disposition: A | Discharge status: Home / Self Care | Payer: 59 | Source: Ambulatory Visit | Attending: Orthopedic Surgery | Admitting: Orthopedic Surgery

## 2017-06-26 DIAGNOSIS — Z01810 Encounter for preprocedural cardiovascular examination: Secondary | ICD-10-CM | POA: Insufficient documentation

## 2017-06-26 DIAGNOSIS — E119 Type 2 diabetes mellitus without complications: Secondary | ICD-10-CM | POA: Insufficient documentation

## 2017-06-26 DIAGNOSIS — I10 Essential (primary) hypertension: Secondary | ICD-10-CM | POA: Insufficient documentation

## 2017-06-26 HISTORY — DX: Personal history of other diseases of the digestive system: Z87.19

## 2017-06-26 HISTORY — DX: Personal history of urinary calculi: Z87.442

## 2017-06-26 HISTORY — DX: Family history of other specified conditions: Z84.89

## 2017-06-26 NOTE — Patient Instructions (Signed)
Your procedure is scheduled on: 07-03-17 THURSDAY Report to Same Day Surgery 2nd floor medical mall [facility] Entrance-take elevator on left to 2nd floor.  Check in with surgery information desk.) To find out your arrival time please call [phone] between 1PM - 3PM on 07-02-17 [facility]   Remember: Instructions that are not followed completely may result in serious medical risk, up to and including death, or upon the discretion of your surgeon and anesthesiologist your surgery may need to be rescheduled.    _x___ 1. Do not eat food after midnight the night before your procedure. NO GUM CHEWING OR HARD CANDIES.  You may drink clear liquids up to 2 hours before you are scheduled to arrive at the hospital for your procedure.  Do not drink clear liquids within 2 hours of your scheduled arrival to the hospital.  Clear liquids include  --Water or Apple juice without pulp  --Clear carbohydrate beverage such as ClearFast or Gatorade  --Black Coffee or Clear Tea (No milk, no creamers, do not add anything to the coffee or Tea)  Type 1 and type 2 diabetics should only drink water. .     __x__ 2. No Alcohol for 24 hours before or after surgery.   __x__3. No Smoking for 24 prior to surgery.   ____  4. Bring all medications with you on the day of surgery if instructed.    __x__ 5. Notify your doctor if there is any change in your medical condition     (cold, fever, infections).     Do not wear jewelry, make-up, hairpins, clips or nail polish.  Do not wear lotions, powders, or perfumes. You may wear deodorant.  Do not shave 48 hours prior to surgery. Men may shave face and neck.  Do not bring valuables to the hospital.    [facility] is not responsible for any belongings or valuables.               Contacts, dentures or bridgework may not be worn into surgery.  Leave your suitcase in the car. After surgery it may be brought to your room.  For patients admitted to the hospital, discharge  time is determined by your treatment team.   Patients discharged the day of surgery will not be allowed to drive home.  You will need someone to drive you home and stay with you the night of your procedure.    Please read over the following fact sheets that you were given:     _x___ [place] WITH A SMALL SIP OF WATER. These include:  1. WELLBUTRIN (BUPROPION)  2. PROZAC (FLUOXETINE)  3. NEXIUM (ESOMEPRAZOLE)  4. TAKE AN EXTRA NEXIUM Wednesday NIGHT BEFORE BED (07-02-17)  5.  6.  ____Fleets enema or Magnesium Citrate as directed.   _x___ Use CHG Soap or sage wipes as directed on instruction sheet   ____ Use inhalers on the day of surgery and bring to hospital day of surgery  _X___ Stop Metformin and Janumet 2 days prior to surgery-LAST DOSE OF METFORMIN ON Monday, October 15TH    ____ Take 1/2 of usual insulin dose the night before surgery and none on the morning surgery.   ____ Follow recommendations from Cardiologist, Pulmonologist or PCP regarding stopping Aspirin, Coumadin, Plavix ,Eliquis, Effient, or Pradaxa, and Pletal.  X____Stop Anti-inflammatories such as Advil, Aleve, Ibuprofen, Motrin, Naproxen, Naprosyn, Goodies powders or aspirin products NOW-OK to take Tylenol    ____ Stop supplements until  after surgery.   ____ Bring C-Pap to the hospital.

## 2017-06-27 ENCOUNTER — Encounter
Admission: RE | Admit: 2017-06-27 | Discharge: 2017-06-27 | Disposition: A | Discharge status: Home / Self Care | Payer: 59 | Source: Ambulatory Visit | Attending: Orthopedic Surgery | Admitting: Orthopedic Surgery

## 2017-06-27 DIAGNOSIS — I10 Essential (primary) hypertension: Secondary | ICD-10-CM | POA: Diagnosis not present

## 2017-06-27 DIAGNOSIS — Z01810 Encounter for preprocedural cardiovascular examination: Secondary | ICD-10-CM | POA: Diagnosis not present

## 2017-06-27 DIAGNOSIS — E119 Type 2 diabetes mellitus without complications: Secondary | ICD-10-CM | POA: Diagnosis not present

## 2017-07-02 ENCOUNTER — Encounter: Payer: Self-pay | Admitting: *Deleted

## 2017-07-03 ENCOUNTER — Ambulatory Visit: Payer: 59 | Admitting: Anesthesiology

## 2017-07-03 ENCOUNTER — Encounter
Admission: RE | Disposition: A | Discharge status: Home / Self Care | Payer: Self-pay | Source: Ambulatory Visit | Attending: Orthopedic Surgery

## 2017-07-03 ENCOUNTER — Encounter: Payer: Self-pay | Admitting: *Deleted

## 2017-07-03 ENCOUNTER — Ambulatory Visit
Admission: RE | Admit: 2017-07-03 | Discharge: 2017-07-03 | Disposition: A | Discharge status: Home / Self Care | Payer: 59 | Source: Ambulatory Visit | Attending: Orthopedic Surgery | Admitting: Orthopedic Surgery

## 2017-07-03 DIAGNOSIS — M25531 Pain in right wrist: Secondary | ICD-10-CM | POA: Diagnosis present

## 2017-07-03 DIAGNOSIS — E119 Type 2 diabetes mellitus without complications: Secondary | ICD-10-CM | POA: Insufficient documentation

## 2017-07-03 DIAGNOSIS — F329 Major depressive disorder, single episode, unspecified: Secondary | ICD-10-CM | POA: Insufficient documentation

## 2017-07-03 DIAGNOSIS — I10 Essential (primary) hypertension: Secondary | ICD-10-CM | POA: Diagnosis not present

## 2017-07-03 DIAGNOSIS — G5601 Carpal tunnel syndrome, right upper limb: Secondary | ICD-10-CM | POA: Insufficient documentation

## 2017-07-03 DIAGNOSIS — K219 Gastro-esophageal reflux disease without esophagitis: Secondary | ICD-10-CM | POA: Insufficient documentation

## 2017-07-03 DIAGNOSIS — Z7984 Long term (current) use of oral hypoglycemic drugs: Secondary | ICD-10-CM | POA: Diagnosis not present

## 2017-07-03 DIAGNOSIS — M1990 Unspecified osteoarthritis, unspecified site: Secondary | ICD-10-CM | POA: Diagnosis not present

## 2017-07-03 DIAGNOSIS — Z79899 Other long term (current) drug therapy: Secondary | ICD-10-CM | POA: Diagnosis not present

## 2017-07-03 HISTORY — PX: CARPAL TUNNEL RELEASE: SHX101

## 2017-07-03 LAB — GLUCOSE, CAPILLARY
GLUCOSE-CAPILLARY: 134 mg/dL — AB (ref 65–99)
Glucose-Capillary: 114 mg/dL — ABNORMAL HIGH (ref 65–99)

## 2017-07-03 SURGERY — CARPAL TUNNEL RELEASE
Anesthesia: General | Site: Wrist | Laterality: Right | Wound class: Clean

## 2017-07-03 MED ORDER — BUPIVACAINE HCL (PF) 0.5 % IJ SOLN
INTRAMUSCULAR | Status: AC
  Filled 2017-07-03: qty 30

## 2017-07-03 MED ORDER — DEXAMETHASONE SODIUM PHOSPHATE 10 MG/ML IJ SOLN
INTRAMUSCULAR | Status: AC
  Filled 2017-07-03: qty 1

## 2017-07-03 MED ORDER — DEXAMETHASONE SODIUM PHOSPHATE 10 MG/ML IJ SOLN
INTRAMUSCULAR | Status: DC | PRN
  Administered 2017-07-03: 5 mg via INTRAVENOUS

## 2017-07-03 MED ORDER — ONDANSETRON HCL 4 MG/2ML IJ SOLN
INTRAMUSCULAR | Status: AC
  Filled 2017-07-03: qty 2

## 2017-07-03 MED ORDER — FENTANYL CITRATE (PF) 100 MCG/2ML IJ SOLN: 25.0000 ug | INTRAMUSCULAR | Status: DC | PRN

## 2017-07-03 MED ORDER — FENTANYL CITRATE (PF) 100 MCG/2ML IJ SOLN
INTRAMUSCULAR | Status: DC | PRN
  Administered 2017-07-03 (×2): 50 ug via INTRAVENOUS

## 2017-07-03 MED ORDER — ONDANSETRON HCL 4 MG/2ML IJ SOLN
INTRAMUSCULAR | Status: DC | PRN
  Administered 2017-07-03: 4 mg via INTRAVENOUS

## 2017-07-03 MED ORDER — MIDAZOLAM HCL 2 MG/2ML IJ SOLN
INTRAMUSCULAR | Status: AC
  Filled 2017-07-03: qty 2

## 2017-07-03 MED ORDER — MIDAZOLAM HCL 2 MG/2ML IJ SOLN
INTRAMUSCULAR | Status: DC | PRN
  Administered 2017-07-03: 2 mg via INTRAVENOUS

## 2017-07-03 MED ORDER — GLYCOPYRROLATE 0.2 MG/ML IJ SOLN
INTRAMUSCULAR | Status: AC
  Filled 2017-07-03: qty 1

## 2017-07-03 MED ORDER — FENTANYL CITRATE (PF) 100 MCG/2ML IJ SOLN
INTRAMUSCULAR | Status: AC
  Filled 2017-07-03: qty 2

## 2017-07-03 MED ORDER — ACETAMINOPHEN 10 MG/ML IV SOLN
INTRAVENOUS | Status: AC
  Filled 2017-07-03: qty 100

## 2017-07-03 MED ORDER — BUPIVACAINE HCL 0.5 % IJ SOLN
INTRAMUSCULAR | Status: DC | PRN
  Administered 2017-07-03: 10 mL

## 2017-07-03 MED ORDER — GLYCOPYRROLATE 0.2 MG/ML IJ SOLN
INTRAMUSCULAR | Status: DC | PRN
  Administered 2017-07-03: 0.2 mg via INTRAVENOUS

## 2017-07-03 MED ORDER — ACETAMINOPHEN 10 MG/ML IV SOLN
INTRAVENOUS | Status: DC | PRN
  Administered 2017-07-03: 1000 mg via INTRAVENOUS

## 2017-07-03 MED ORDER — KETOROLAC TROMETHAMINE 30 MG/ML IJ SOLN
INTRAMUSCULAR | Status: DC | PRN
  Administered 2017-07-03: 30 mg via INTRAVENOUS

## 2017-07-03 MED ORDER — ONDANSETRON HCL 4 MG/2ML IJ SOLN: 4.0000 mg | Freq: Once | INTRAMUSCULAR | Status: DC | PRN

## 2017-07-03 MED ORDER — KETOROLAC TROMETHAMINE 30 MG/ML IJ SOLN
INTRAMUSCULAR | Status: AC
  Filled 2017-07-03: qty 1

## 2017-07-03 MED ORDER — PROPOFOL 10 MG/ML IV BOLUS
INTRAVENOUS | Status: AC
  Filled 2017-07-03: qty 20

## 2017-07-03 MED ORDER — HYDROCODONE-ACETAMINOPHEN 5-325 MG PO TABS: 1.0000 | ORAL_TABLET | Freq: Four times a day (QID) | ORAL | 0 refills | Status: DC | PRN

## 2017-07-03 MED ORDER — SODIUM CHLORIDE 0.9 % IV SOLN
INTRAVENOUS | Status: DC
  Administered 2017-07-03: 10:00:00 via INTRAVENOUS

## 2017-07-03 MED ORDER — PROPOFOL 10 MG/ML IV BOLUS
INTRAVENOUS | Status: DC | PRN
  Administered 2017-07-03: 200 mg via INTRAVENOUS

## 2017-07-03 MED ORDER — LIDOCAINE HCL (CARDIAC) 20 MG/ML IV SOLN
INTRAVENOUS | Status: DC | PRN
  Administered 2017-07-03: 100 mg via INTRAVENOUS

## 2017-07-03 SURGICAL SUPPLY — 21 items
BANDAGE ACE 3X5.8 VEL STRL LF (GAUZE/BANDAGES/DRESSINGS) ×3 IMPLANT
CANISTER SUCT 1200ML W/VALVE (MISCELLANEOUS) ×3 IMPLANT
CHLORAPREP W/TINT 26ML (MISCELLANEOUS) ×3 IMPLANT
CUFF TOURN 18 STER (MISCELLANEOUS) IMPLANT
ELECT CAUTERY NEEDLE 2.0 MIC (NEEDLE) IMPLANT
GAUZE PETRO XEROFOAM 1X8 (MISCELLANEOUS) ×3 IMPLANT
GAUZE SPONGE 4X4 12PLY STRL (GAUZE/BANDAGES/DRESSINGS) ×3 IMPLANT
GLOVE SURG SYN 9.0  PF PI (GLOVE) ×2
GLOVE SURG SYN 9.0 PF PI (GLOVE) ×1 IMPLANT
GOWN SRG 2XL LVL 4 RGLN SLV (GOWNS) ×1 IMPLANT
GOWN STRL NON-REIN 2XL LVL4 (GOWNS) ×2
GOWN STRL REUS W/ TWL LRG LVL3 (GOWN DISPOSABLE) ×1 IMPLANT
GOWN STRL REUS W/TWL LRG LVL3 (GOWN DISPOSABLE) ×2
KIT RM TURNOVER STRD PROC AR (KITS) ×3 IMPLANT
NS IRRIG 500ML POUR BTL (IV SOLUTION) ×3 IMPLANT
PACK EXTREMITY ARMC (MISCELLANEOUS) ×3 IMPLANT
PAD CAST CTTN 4X4 STRL (SOFTGOODS) ×1 IMPLANT
PADDING CAST COTTON 4X4 STRL (SOFTGOODS) ×2
SUT ETHILON 4-0 (SUTURE) ×2
SUT ETHILON 4-0 FS2 18XMFL BLK (SUTURE) ×1
SUTURE ETHLN 4-0 FS2 18XMF BLK (SUTURE) ×1 IMPLANT

## 2017-07-03 NOTE — Transfer of Care (Signed)
Immediate Anesthesia Transfer of Care Note  Patient: [name]  Procedure(s) Performed: CARPAL TUNNEL RELEASE (Right Wrist)  Patient Location: PACU  Anesthesia Type:General  Level of Consciousness: sedated  Airway & Oxygen Therapy: Patient Spontanous Breathing and Patient connected to face mask oxygen  Post-op Assessment: Report given to RN and Post -op Vital signs reviewed and stable  Post vital signs: Reviewed and stable  Last Vitals:  Vitals:   07/03/17 0842  BP: (!) 142/98  Pulse: 76  Resp: 16  Temp: 36.6 C  SpO2: 99%    Last Pain:  Vitals:   07/03/17 0842  TempSrc: Oral  PainSc: 2          Complications: No apparent anesthesia complications

## 2017-07-03 NOTE — H&P (Signed)
Reviewed paper H+P, will be scanned into chart. No changes noted.  

## 2017-07-03 NOTE — Anesthesia Procedure Notes (Signed)
Procedure Name: LMA Insertion Date/Time: 07/03/2017 9:51 AM Performed by: Aline Brochure Pre-anesthesia Checklist: Patient identified, Emergency Drugs available, Suction available and Patient being monitored Patient Re-evaluated:Patient Re-evaluated prior to induction Oxygen Delivery Method: Circle system utilized Preoxygenation: Pre-oxygenation with 100% oxygen Induction Type: IV induction Ventilation: Mask ventilation without difficulty LMA: LMA inserted LMA Size: 4.5 Placement Confirmation: positive ETCO2 and breath sounds checked- equal and bilateral Tube secured with: Tape Dental Injury: Teeth and Oropharynx as per pre-operative assessment

## 2017-07-03 NOTE — Op Note (Signed)
07/03/2017  10:29 AM  PATIENT:  [name]  41 y.o. male  PRE-OPERATIVE DIAGNOSIS:  RIGHT CARPAL TUNNEL SYNDROME   POST-OPERATIVE DIAGNOSIS:  RIGHT CARPAL TUNNEL SYNDROME   PROCEDURE:  Procedure(s): CARPAL TUNNEL RELEASE (Right)  SURGEON: Laurene Footman, MD  ASSISTANTS: none  ANESTHESIA:   general  EBL:  Total I/O In: 600 [I.V.:600] Out: 2 [Blood:2]  BLOOD ADMINISTERED:none  DRAINS: none   LOCAL MEDICATIONS USED:  MARCAINE     SPECIMEN:  No Specimen  DISPOSITION OF SPECIMEN:  N/A  COUNTS:  YES  TOURNIQUET:   14 minutes at 250 mmHg  IMPLANTS: none  DICTATION: .Dragon Dictation  Patient brought the operating room and after adequate anesthesia was obtained the right arm was prepped draped in sterile fashion. After patient identification and timeout procedures were completed, tourniquet was raised. Incision was made in line with the ring metacarpal approximate 2 cm in length. The subcutaneous tissues tissue was divided distally transferred carpal ligament was identified. This was opened with a scalpel and then a vascular hemostat placed deep to protect the underlying structures. Release care distally and then proximally with proximal releases proximal to the wrist flexion crease there is good vascular blush to the nerve appeared that there was some constriction at that level. There are no masses present within the carpal tunnel. The wound was then irrigated and local asthenic infiltrated around the incision to aid in postop analgesia. There was then closed with simple 4-0 nylon for the skin followed by Xeroform 4 x 4's web roll and an Ace wrap tourniquet is let down  PLAN OF CARE: Discharge to home after PACU  PATIENT DISPOSITION:  PACU - hemodynamically stable.

## 2017-07-03 NOTE — Anesthesia Post-op Follow-up Note (Signed)
Anesthesia QCDR form completed.        

## 2017-07-03 NOTE — Discharge Instructions (Addendum)
Loosen Ace wrap if fingers swell. Work fingers is much as possible. Pain medicine as directed  AMBULATORY SURGERY  DISCHARGE INSTRUCTIONS   1) The drugs that you were given will stay in your system until tomorrow so for the next 24 hours you should not:  A) Drive an automobile B) Make any legal decisions C) Drink any alcoholic beverage   2) You may resume regular meals tomorrow.  Today it is better to start with liquids and gradually work up to solid foods.  You may eat anything you prefer, but it is better to start with liquids, then soup and crackers, and gradually work up to solid foods.   3) Please notify your doctor immediately if you have any unusual bleeding, trouble breathing, redness and pain at the surgery site, drainage, fever, or pain not relieved by medication.    4) Additional Instructions:        Please contact your physician with any problems or Same Day Surgery at [phone], Monday through Friday 6 am to 4 pm, or [place] at [facility] number at [phone].

## 2017-07-03 NOTE — Anesthesia Postprocedure Evaluation (Signed)
Anesthesia Post Note  Patient: [name]  Procedure(s) Performed: CARPAL TUNNEL RELEASE (Right Wrist)  Patient location during evaluation: PACU Anesthesia Type: General Level of consciousness: awake and alert Pain management: pain level controlled Vital Signs Assessment: post-procedure vital signs reviewed and stable Respiratory status: spontaneous breathing, nonlabored ventilation, respiratory function stable and patient connected to nasal cannula oxygen Cardiovascular status: blood pressure returned to baseline and stable Postop Assessment: no apparent nausea or vomiting Anesthetic complications: no     Last Vitals:  Vitals:   07/03/17 1038 07/03/17 1053  BP: 125/83 121/89  Pulse: 71 65  Resp: 11 10  Temp:    SpO2: 94% 95%    Last Pain:  Vitals:   07/03/17 1023  TempSrc:   PainSc: Asleep                 Ulises Wolfinger S

## 2017-07-03 NOTE — Anesthesia Preprocedure Evaluation (Signed)
Anesthesia Evaluation  Patient identified by MRN, date of birth, ID band Patient awake    Reviewed: Allergy & Precautions, NPO status , Patient's Chart, lab work & pertinent test results, reviewed documented beta blocker date and time   Airway Mallampati: III  TM Distance: >3 FB     Dental  (+) Chipped   Pulmonary           Cardiovascular hypertension, Pt. on medications      Neuro/Psych PSYCHIATRIC DISORDERS Depression    GI/Hepatic hiatal hernia, GERD  Controlled,  Endo/Other  diabetes, Type 2  Renal/GU      Musculoskeletal  (+) Arthritis ,   Abdominal   Peds  Hematology   Anesthesia Other Findings   Reproductive/Obstetrics                             Anesthesia Physical Anesthesia Plan  ASA: III  Anesthesia Plan: General   Post-op Pain Management:    Induction: Intravenous  PONV Risk Score and Plan:   Airway Management Planned: LMA  Additional Equipment:   Intra-op Plan:   Post-operative Plan:   Informed Consent: I have reviewed the patients History and Physical, chart, labs and discussed the procedure including the risks, benefits and alternatives for the proposed anesthesia with the patient or authorized representative who has indicated his/her understanding and acceptance.     Plan Discussed with: CRNA  Anesthesia Plan Comments:         Anesthesia Quick Evaluation

## 2017-07-30 ENCOUNTER — Other Ambulatory Visit: Payer: Self-pay | Admitting: Family Medicine

## 2017-07-30 DIAGNOSIS — K219 Gastro-esophageal reflux disease without esophagitis: Secondary | ICD-10-CM

## 2017-07-30 MED ORDER — ESOMEPRAZOLE MAGNESIUM 40 MG PO CPDR: 40.0000 mg | DELAYED_RELEASE_CAPSULE | ORAL | 3 refills | Status: DC

## 2017-07-30 NOTE — Telephone Encounter (Signed)
Declined automatic refill for Esomeprazole 40mg  BID, last visit 02/2017 we agreed to continue 40mg  daily after tapered down, sent new refill for 40mg  daily #90 +3 refills  [name], DO [facility] 07/30/2017, 12:27 PM

## 2017-08-26 ENCOUNTER — Other Ambulatory Visit: Payer: Self-pay | Admitting: Family Medicine

## 2017-08-26 DIAGNOSIS — M1990 Unspecified osteoarthritis, unspecified site: Secondary | ICD-10-CM

## 2017-10-20 ENCOUNTER — Encounter: Payer: Self-pay | Admitting: Family Medicine

## 2017-10-20 ENCOUNTER — Ambulatory Visit (INDEPENDENT_AMBULATORY_CARE_PROVIDER_SITE_OTHER): Payer: 59 | Admitting: Family Medicine

## 2017-10-20 VITALS — BP 134/89 | HR 104 | Temp 98.6°F | Resp 16 | Ht 73.0 in | Wt 257.0 lb

## 2017-10-20 DIAGNOSIS — J029 Acute pharyngitis, unspecified: Secondary | ICD-10-CM

## 2017-10-20 DIAGNOSIS — J209 Acute bronchitis, unspecified: Secondary | ICD-10-CM | POA: Diagnosis not present

## 2017-10-20 DIAGNOSIS — J3089 Other allergic rhinitis: Secondary | ICD-10-CM | POA: Diagnosis not present

## 2017-10-20 LAB — POCT RAPID STREP A (OFFICE): RAPID STREP A SCREEN: NEGATIVE

## 2017-10-20 MED ORDER — AZITHROMYCIN 250 MG PO TABS: ORAL_TABLET | ORAL | 0 refills | Status: DC

## 2017-10-20 MED ORDER — IPRATROPIUM BROMIDE 0.06 % NA SOLN: 2.0000 | Freq: Four times a day (QID) | NASAL | 0 refills | Status: DC

## 2017-10-20 NOTE — Progress Notes (Signed)
Subjective:    Patient ID: CARVIN ALMAS, male    DOB: 04-09-1976, 42 y.o.   MRN: [id number]  ADIT RIDDLES is a 42 y.o. male presenting on 10/20/2017 for Fever (on and off 3 days, coughing, sore throat, drainge HA OTC improves Sxs little)  Patient presents for a same day appointment.  HPI  ACUTE BRONCHITIS / URI Reports symptoms started at end of last week with sinus / nasal congestion, coughing, then worsening with now more productive thicker yellow sputum, worsening sore throat. Some blood mixed into yellow nasal drainage. - Recent sick contact, wife was diagnosed with Strep Throat 1 week ago, she just recently finished antibiotic Azithromycin - Admits fevers measured at home 100-101F with some chills, sweats - Admits vomiting x 2-3 episodes, last night, mostly from sinus drainage - Admits fatigue - Denies abdominal pain, diarrhea, nausea, body aches muscle aches  Health Maintenance: UTD Flu Vaccine  Depression screen [place] 2/9 06/24/2017 03/12/2017 10/08/2016  Decreased Interest 0 0 0  Down, Depressed, Hopeless 0 0 0  PHQ - 2 Score 0 0 0  Altered sleeping 1 0 -  Tired, decreased energy 1 1 -  Change in appetite 0 0 -  Feeling bad or failure about yourself  0 - -  Trouble concentrating 0 0 -  Moving slowly or fidgety/restless 0 0 -  Suicidal thoughts 0 0 -  PHQ-9 Score 2 1 -  Difficult doing work/chores Not difficult at all - -    Social History   Tobacco Use  . Smoking status: Never Smoker  . Smokeless tobacco: Never Used  Substance Use Topics  . Alcohol use: Yes    Alcohol/week: 0.0 oz    Comment: RARE  . Drug use: No    Review of Systems Per HPI unless specifically indicated above     Objective:    BP 134/89   Pulse (!) 104   Temp 98.6 F (37 C) (Oral)   Resp 16   Ht 6\' 1"  (1.854 m)   Wt 257 lb (116.6 kg)   SpO2 99%   BMI 33.91 kg/m   Wt Readings from Last 3 Encounters:  10/20/17 257 lb (116.6 kg)  07/03/17 256 lb (116.1 kg)  06/24/17 256 lb  (116.1 kg)    Physical Exam  Constitutional: He is oriented to person, place, and time. He appears well-developed and well-nourished. No distress.  Mildly ill and tired appearing, comfortable, cooperative  HENT:  Head: Normocephalic and atraumatic.  Mouth/Throat: Oropharynx is clear and moist.  Frontal sinuses mild tender, maxillary non tender. Nares with some mild turbinate edema and congestion without purulence. Bilateral TMs clear without erythema, effusion or bulging. Oropharynx mild posterior pharyngeal drainage without erythema, exudates, edema or asymmetry.  Eyes: Conjunctivae are normal. Right eye exhibits no discharge. Left eye exhibits no discharge.  Neck: Normal range of motion. Neck supple.  Cardiovascular: Regular rhythm, normal heart sounds and intact distal pulses.  No murmur heard. Mild tachycardic  Pulmonary/Chest: Effort normal. No respiratory distress. He has no wheezes.  Bilateral scattered coarse breath sounds, non focal, no wheezing or crackles. Slightly reduced air movement. Occasional coughing.  Musculoskeletal: Normal range of motion. He exhibits no edema.  Lymphadenopathy:    He has no cervical adenopathy.  Neurological: He is alert and oriented to person, place, and time.  Skin: Skin is warm and dry. No rash noted. He is not diaphoretic. No erythema.  Psychiatric: His behavior is normal.  Nursing note and vitals reviewed.  Results for orders placed or performed in visit on 10/20/17  POCT rapid strep A  Result Value Ref Range   Rapid Strep A Screen Negative Negative      Assessment & Plan:   Problem List Items Addressed This Visit    Seasonal allergic rhinitis   Relevant Medications   ipratropium (ATROVENT) 0.06 % nasal spray    Other Visit Diagnoses    Acute bronchitis, unspecified organism    -  Primary   Relevant Medications   azithromycin (ZITHROMAX Z-PAK) 250 MG tablet   ipratropium (ATROVENT) 0.06 % nasal spray   Sore throat       Relevant  Orders   POCT rapid strep A (Completed)      Consistent with worsening bronchitis in setting of likely viral URI (+sick contacts also wife +Strep treated with azithro) - Afebrile, not focal with lungs (not consistent with pneumonia by history or exam), possible frontal sinusitis. Mild coarse breath sounds on exam concern for some bronchospasm (without history of COPD, never smoker)  Plan: 1. RAPID Strep NEGATIVE 2. Start Azithromycin Z-pak dosing 500mg  then 250mg  daily x 4 days 3. Start Atrovent nasal spray decongestant 2 sprays in each nostril up to 4 times daily for 7 days - DC Flonase 4. Recommend trial OTC - Mucinex, Tylenol/Ibuprofen PRN, Nasal saline, lozenges, tea with honey/lemon 5. Return criteria reviewed, follow-up within 1 week if not improved   Meds ordered this encounter  Medications  . azithromycin (ZITHROMAX Z-PAK) 250 MG tablet    Sig: Take 2 tabs (500mg  total) on Day 1. Take 1 tab (250mg ) daily for next 4 days.    Dispense:  6 tablet    Refill:  0  . ipratropium (ATROVENT) 0.06 % nasal spray    Sig: Place 2 sprays into both nostrils 4 (four) times daily. For up to 5-7 days then stop.    Dispense:  15 mL    Refill:  0     Follow up plan: Return in about 2 weeks (around 11/03/2017), or if symptoms worsen or fail to improve, for URI Bronchitis.  [name], [facility] 10/20/2017, 12:59 PM

## 2017-10-20 NOTE — Patient Instructions (Addendum)
Thank you for coming to the office today.  1. It sounds like you had an Upper Respiratory Virus that has settled into a Bronchitis, lower respiratory tract infection. I don't have concerns for pneumonia today, and think that this should gradually improve. Once you are feeling better, the cough may take a few weeks to fully resolve. I do hear wheezing and coarse breath sounds, this may be due to the virus,  Start Azithromycin Z pak (antibiotic) 2 tabs day 1, then 1 tab x 4 days, complete entire course even if improved  Start Atrovent nasal spray decongestant 2 sprays in each nostril up to 4 times daily for 7 days  Continue Robitussin  May take OTC Mucinex for 1 week or less, to help clear the mucus  Stop Flonase  - Use nasal saline (Simply Saline or Ocean Spray) to flush nasal congestion multiple times a day, may help cough - Drink plenty of fluids to improve congestion  If your symptoms seem to worsen instead of improve over next several days, including significant fever / chills, worsening shortness of breath, worsening wheezing, or nausea / vomiting and can't take medicines - return sooner or go to hospital Emergency Department for more immediate treatment.  Please schedule a Follow-up Appointment to: Return in about 2 weeks (around 11/03/2017), or if symptoms worsen or fail to improve, for URI Bronchitis.    If you have any other questions or concerns, please feel free to call the office or send a message through [place]. You may also schedule an earlier appointment if necessary.  Additionally, you may be receiving a survey about your experience at our office within a few days to 1 week by e-mail or mail. We value your feedback.  [name], DO [place]

## 2017-11-07 DIAGNOSIS — E119 Type 2 diabetes mellitus without complications: Secondary | ICD-10-CM | POA: Diagnosis not present

## 2017-11-07 LAB — HEMOGLOBIN A1C: HEMOGLOBIN A1C: 6.7

## 2017-11-14 DIAGNOSIS — I10 Essential (primary) hypertension: Secondary | ICD-10-CM | POA: Diagnosis not present

## 2017-11-14 DIAGNOSIS — E119 Type 2 diabetes mellitus without complications: Secondary | ICD-10-CM | POA: Diagnosis not present

## 2017-12-23 ENCOUNTER — Other Ambulatory Visit: Payer: Self-pay | Admitting: Family Medicine

## 2017-12-23 ENCOUNTER — Encounter: Payer: Self-pay | Admitting: Family Medicine

## 2017-12-23 ENCOUNTER — Ambulatory Visit (INDEPENDENT_AMBULATORY_CARE_PROVIDER_SITE_OTHER): Payer: 59 | Admitting: Family Medicine

## 2017-12-23 VITALS — BP 128/80 | HR 97 | Temp 98.7°F | Resp 16 | Ht 73.0 in | Wt 253.0 lb

## 2017-12-23 DIAGNOSIS — K219 Gastro-esophageal reflux disease without esophagitis: Secondary | ICD-10-CM

## 2017-12-23 DIAGNOSIS — E785 Hyperlipidemia, unspecified: Secondary | ICD-10-CM

## 2017-12-23 DIAGNOSIS — Z0000 Encounter for general adult medical examination without abnormal findings: Secondary | ICD-10-CM

## 2017-12-23 DIAGNOSIS — I10 Essential (primary) hypertension: Secondary | ICD-10-CM

## 2017-12-23 DIAGNOSIS — F338 Other recurrent depressive disorders: Secondary | ICD-10-CM | POA: Diagnosis not present

## 2017-12-23 DIAGNOSIS — F3342 Major depressive disorder, recurrent, in full remission: Secondary | ICD-10-CM

## 2017-12-23 DIAGNOSIS — L570 Actinic keratosis: Secondary | ICD-10-CM | POA: Diagnosis not present

## 2017-12-23 DIAGNOSIS — E1169 Type 2 diabetes mellitus with other specified complication: Secondary | ICD-10-CM | POA: Diagnosis not present

## 2017-12-23 NOTE — Assessment & Plan Note (Signed)
Well-controlled HTN No known complications    Plan:  1. Continue current BP regimen - Valsartan-HCTZ 160-12.5mg  daily 2. Encourage improved lifestyle - low sodium diet, regular exercise 3. May continue monitor BP outside office, bring readings to next visit, if persistently >140/90 or new symptoms notify office sooner 4. Follow-up 6 months - annual / labs

## 2017-12-23 NOTE — Assessment & Plan Note (Addendum)
Well-controlled DM with A1c 6.7 (last done 10/2017) - cancelled A1c order for today not due Complicated with HLD, HTN, GERD Followed by [facility] Endocrinology Dr Gabriel Carina  Plan:  1. Continue current therapy - Metformin XR 2000mg  daily 2. Encourage improved lifestyle - low carb, low sugar diet, reduce portion size, continue improving regular exercise 3. Continue f/u Endocrinology - next due DM Eye Exam 01/2018 [facility]

## 2017-12-23 NOTE — Assessment & Plan Note (Signed)
Stable, controlled. Seems currently in remission. Suspected factor with Seasonal Affective Disorder Followed by Psychiatry Dr Kaur in [place] Continue Fluoxetine 20mg daily, Wellbutrin XL 150mg daily 

## 2017-12-23 NOTE — Patient Instructions (Addendum)
Thank you for coming to the office today.  Referral to Dr Nehemiah Massed for skin spot on ear, seems to be AK or precancerous, likely needs cryotherapy  [facility]   [place], [place] [address] Hours: 8AM-5PM Phone: [phone]  Follow-up with Dr Gabriel Carina as planned every 6 months for labs  No labs from me this time in 6 months  Keep up the great work overall  Please schedule a Follow-up Appointment to: Return in about 6 months (around 06/24/2018) for Annual Physical (fasting may add labs on that day).  If you have any other questions or concerns, please feel free to call the office or send a message through [place]. You may also schedule an earlier appointment if necessary.  Additionally, you may be receiving a survey about your experience at our office within a few days to 1 week by e-mail or mail. We value your feedback.  [name], DO [place]

## 2017-12-23 NOTE — Progress Notes (Signed)
Subjective:    Patient ID: [name], male    DOB: [birth year], 42 y.o.   MRN: [id number]  [name] is a 42 y.o. male presenting on 12/23/2017 for Hypertension and Diabetes   HPI   Abnormal Skin Spot / Left Ear, external - Reports spot on outer ear seems to be raised slowly develops then will get "flaky" and come off and then return, seems to do this few times a year. Has not seen Dermatology in past several years. - Fam history of non melanoma skin cancer BCC, AKs, father and grandparents - Denies any pain, itching, scab ulcer  CHRONIC DM, Type 2: Followed by [facility] Endocrinology Dr Gabriel Carina q 6 months - last A1c 6.7 (10/2017) Meds: Metformin 2000mg  XR daily at bedtime Reports good compliance. Tolerating well w/o side-effects Currently on ARB Lifestyle: - Diet (Still improved DM diet) - Exercise (plans to improve regular exercise again now summer) Denies hypoglycemia, polyuria, visual changes, numbness or tingling.  CHRONIC HTN: Reports home BP readings on occasion are normal. No recent concerns Current Meds - Valsartan-HCTZ 160-12.5mg  daily in AM Reports good compliance, took meds today. Tolerating well, w/o complaints. Denies CP, dyspnea, HA, edema, dizziness / lightheadedness  History of Depression, now in remission / Presumed Seasonal Affective Disorder - Reports chronic problem has episodic flares usually related to life stressors and winter months - Followed by [facility] Psychiatry - Dr Chucky May, has been on Fluoxetine 20mg  daily, and also has been on Wellbutrin XL 150mg  in addition for past 1 year - Reports he is doing well overall, currently depression is controlled, he feels like it is in remission, still has occasional days with worse mood than others but overall on average no new concerns   Depression screen [facility] 2/9 12/23/2017 06/24/2017 03/12/2017  Decreased Interest 0 0 0  Down, Depressed, Hopeless 0 0 0  PHQ - 2 Score 0 0 0  Altered sleeping 0 1 0    Tired, decreased energy 1 1 1   Change in appetite 1 0 0  Feeling bad or failure about yourself  0 0 -  Trouble concentrating 0 0 0  Moving slowly or fidgety/restless 0 0 0  Suicidal thoughts 0 0 0  PHQ-9 Score 2 2 1   Difficult doing work/chores Not difficult at all Not difficult at all -    Social History   Tobacco Use  . Smoking status: Never Smoker  . Smokeless tobacco: Never Used  Substance Use Topics  . Alcohol use: Yes    Alcohol/week: 0.0 oz    Comment: RARE  . Drug use: No    Review of Systems Per HPI unless specifically indicated above     Objective:    BP 128/80 (BP Location: Left Arm, Cuff Size: Normal)   Pulse 97   Temp 98.7 F (37.1 C) (Oral)   Resp 16   Ht 6\' 1"  (1.854 m)   Wt 253 lb (114.8 kg)   BMI 33.38 kg/m   Wt Readings from Last 3 Encounters:  12/23/17 253 lb (114.8 kg)  10/20/17 257 lb (116.6 kg)  07/03/17 256 lb (116.1 kg)    Physical Exam  Constitutional: He is oriented to person, place, and time. He appears well-developed and well-nourished. No distress.  Well-appearing, comfortable, cooperative  HENT:  Head: Normocephalic and atraumatic.  Mouth/Throat: Oropharynx is clear and moist.  Eyes: Conjunctivae are normal. Right eye exhibits no discharge. Left eye exhibits no discharge.  Neck: Normal range of motion. Neck supple.  No thyromegaly present.  Cardiovascular: Normal rate, regular rhythm, normal heart sounds and intact distal pulses.  No murmur heard. Pulmonary/Chest: Effort normal and breath sounds normal. No respiratory distress. He has no wheezes. He has no rales.  Musculoskeletal: Normal range of motion. He exhibits no edema.  Lymphadenopathy:    He has no cervical adenopathy.  Neurological: He is alert and oriented to person, place, and time.  Skin: Skin is warm and dry. No rash noted. He is not diaphoretic. No erythema.  Left ear, external ear helix - inner aspect with small area 0.5cm palpable rough dry area, without  erythema or ulceration, appears consistent with AK  Psychiatric: He has a normal mood and affect. His behavior is normal.  Well groomed, good eye contact, normal speech and thoughts  Nursing note and vitals reviewed.  Recent Labs    05/08/17 11/07/17  HGBA1C 6.3 6.7       Assessment & Plan:   Problem List Items Addressed This Visit    Depression    Stable, controlled. Seems currently in remission. Suspected factor with Seasonal Affective Disorder Followed by Psychiatry Dr Toy Care in [facility] Continue Fluoxetine 20mg  daily, Wellbutrin XL 150mg  daily      Essential hypertension    Well-controlled HTN No known complications    Plan:  1. Continue current BP regimen - Valsartan-HCTZ 160-12.5mg  daily 2. Encourage improved lifestyle - low sodium diet, regular exercise 3. May continue monitor BP outside office, bring readings to next visit, if persistently >140/90 or new symptoms notify office sooner 4. Follow-up 6 months - annual / labs      Seasonal affective disorder ([place])    See A&P for depression Suspected factor contributing to his mood as well Continue treatment and Psych      Type 2 diabetes mellitus with other specified complication ([place])    Well-controlled DM with A1c 6.7 (last done 10/2017) - cancelled A1c order for today not due Complicated with HLD, HTN, GERD Followed by [facility] Endocrinology Dr Gabriel Carina  Plan:  1. Continue current therapy - Metformin XR 2000mg  daily 2. Encourage improved lifestyle - low carb, low sugar diet, reduce portion size, continue improving regular exercise 3. Continue f/u Endocrinology - next due DM Eye Exam 01/2018 - [place]       Other Visit Diagnoses    AK (actinic keratosis)    -  Primary Presumed AK on Left ear, seems to be minor, localized, without evidence of malignancy however given chronic recurrence over few years, and sun exposure and fam history agree with Derm referral - Sent to [facility] Dr Nehemiah Massed     Relevant Orders   Ambulatory referral to Dermatology      No orders of the defined types were placed in this encounter.  Orders Placed This Encounter  Procedures  . Hemoglobin A1c    This external order was created through the Results Console.  . Ambulatory referral to Dermatology    Referral Priority:   Routine    Referral Type:   Consultation    Referral Reason:   Specialty Services Required    Referred to Provider:   Ralene Bathe, MD    Requested Specialty:   Dermatology    Number of Visits Requested:   1    Follow up plan: Return in about 6 months (around 06/24/2018) for Annual Physical (fasting may add labs on that day).  Future labs ordered for 06/22/18  [name], [facility] 12/23/2017, 4:32 PM

## 2017-12-23 NOTE — Assessment & Plan Note (Signed)
See A&P for depression Suspected factor contributing to his mood as well Continue treatment and Psych 

## 2017-12-31 ENCOUNTER — Encounter: Payer: Self-pay | Admitting: Family Medicine

## 2018-01-08 ENCOUNTER — Encounter (INDEPENDENT_AMBULATORY_CARE_PROVIDER_SITE_OTHER): Payer: Self-pay

## 2018-01-19 DIAGNOSIS — L821 Other seborrheic keratosis: Secondary | ICD-10-CM | POA: Diagnosis not present

## 2018-01-19 DIAGNOSIS — E119 Type 2 diabetes mellitus without complications: Secondary | ICD-10-CM | POA: Diagnosis not present

## 2018-01-19 DIAGNOSIS — Z808 Family history of malignant neoplasm of other organs or systems: Secondary | ICD-10-CM | POA: Diagnosis not present

## 2018-01-19 DIAGNOSIS — L570 Actinic keratosis: Secondary | ICD-10-CM | POA: Diagnosis not present

## 2018-01-19 DIAGNOSIS — L578 Other skin changes due to chronic exposure to nonionizing radiation: Secondary | ICD-10-CM | POA: Diagnosis not present

## 2018-01-22 DIAGNOSIS — H04123 Dry eye syndrome of bilateral lacrimal glands: Secondary | ICD-10-CM | POA: Diagnosis not present

## 2018-01-22 DIAGNOSIS — H10413 Chronic giant papillary conjunctivitis, bilateral: Secondary | ICD-10-CM | POA: Diagnosis not present

## 2018-01-22 DIAGNOSIS — E119 Type 2 diabetes mellitus without complications: Secondary | ICD-10-CM | POA: Diagnosis not present

## 2018-01-22 LAB — HM DIABETES EYE EXAM

## 2018-01-27 ENCOUNTER — Encounter: Payer: Self-pay | Admitting: Family Medicine

## 2018-01-27 DIAGNOSIS — M7662 Achilles tendinitis, left leg: Secondary | ICD-10-CM | POA: Diagnosis not present

## 2018-01-27 DIAGNOSIS — G8929 Other chronic pain: Secondary | ICD-10-CM | POA: Diagnosis not present

## 2018-01-27 DIAGNOSIS — M79672 Pain in left foot: Secondary | ICD-10-CM | POA: Diagnosis not present

## 2018-01-28 ENCOUNTER — Encounter (INDEPENDENT_AMBULATORY_CARE_PROVIDER_SITE_OTHER): Payer: Self-pay | Admitting: Family Medicine

## 2018-01-28 ENCOUNTER — Ambulatory Visit (INDEPENDENT_AMBULATORY_CARE_PROVIDER_SITE_OTHER): Payer: 59 | Admitting: Family Medicine

## 2018-01-28 VITALS — BP 128/83 | HR 66 | Ht 73.0 in | Wt 249.0 lb

## 2018-01-28 DIAGNOSIS — R5383 Other fatigue: Secondary | ICD-10-CM | POA: Insufficient documentation

## 2018-01-28 DIAGNOSIS — Z1331 Encounter for screening for depression: Secondary | ICD-10-CM | POA: Diagnosis not present

## 2018-01-28 DIAGNOSIS — R0602 Shortness of breath: Secondary | ICD-10-CM | POA: Diagnosis not present

## 2018-01-28 DIAGNOSIS — Z0289 Encounter for other administrative examinations: Secondary | ICD-10-CM

## 2018-01-28 DIAGNOSIS — E119 Type 2 diabetes mellitus without complications: Secondary | ICD-10-CM

## 2018-01-28 DIAGNOSIS — Z9189 Other specified personal risk factors, not elsewhere classified: Secondary | ICD-10-CM | POA: Diagnosis not present

## 2018-01-28 DIAGNOSIS — F3289 Other specified depressive episodes: Secondary | ICD-10-CM | POA: Diagnosis not present

## 2018-01-28 DIAGNOSIS — E669 Obesity, unspecified: Secondary | ICD-10-CM | POA: Diagnosis not present

## 2018-01-28 DIAGNOSIS — Z6832 Body mass index (BMI) 32.0-32.9, adult: Secondary | ICD-10-CM

## 2018-01-29 LAB — HEMOGLOBIN A1C
ESTIMATED AVERAGE GLUCOSE: 131 mg/dL
Hgb A1c MFr Bld: 6.2 % — ABNORMAL HIGH (ref 4.8–5.6)

## 2018-01-29 LAB — CBC WITH DIFFERENTIAL
BASOS: 1 %
Basophils Absolute: 0 10*3/uL (ref 0.0–0.2)
EOS (ABSOLUTE): 0.2 10*3/uL (ref 0.0–0.4)
EOS: 3 %
HEMATOCRIT: 43.5 % (ref 37.5–51.0)
Hemoglobin: 14.6 g/dL (ref 13.0–17.7)
IMMATURE GRANS (ABS): 0 10*3/uL (ref 0.0–0.1)
Immature Granulocytes: 0 %
LYMPHS: 24 %
Lymphocytes Absolute: 1.5 10*3/uL (ref 0.7–3.1)
MCH: 28.3 pg (ref 26.6–33.0)
MCHC: 33.6 g/dL (ref 31.5–35.7)
MCV: 84 fL (ref 79–97)
Monocytes Absolute: 0.4 10*3/uL (ref 0.1–0.9)
Monocytes: 7 %
NEUTROS ABS: 4.2 10*3/uL (ref 1.4–7.0)
Neutrophils: 65 %
RBC: 5.16 x10E6/uL (ref 4.14–5.80)
RDW: 14.3 % (ref 12.3–15.4)
WBC: 6.4 10*3/uL (ref 3.4–10.8)

## 2018-01-29 LAB — INSULIN, RANDOM: INSULIN: 10.5 u[IU]/mL (ref 2.6–24.9)

## 2018-01-29 LAB — COMPREHENSIVE METABOLIC PANEL
A/G RATIO: 1.7 (ref 1.2–2.2)
ALBUMIN: 4.4 g/dL (ref 3.5–5.5)
ALT: 20 IU/L (ref 0–44)
AST: 20 IU/L (ref 0–40)
Alkaline Phosphatase: 70 IU/L (ref 39–117)
BUN/Creatinine Ratio: 19 (ref 9–20)
BUN: 18 mg/dL (ref 6–24)
Bilirubin Total: 0.3 mg/dL (ref 0.0–1.2)
CALCIUM: 9.5 mg/dL (ref 8.7–10.2)
CO2: 21 mmol/L (ref 20–29)
Chloride: 101 mmol/L (ref 96–106)
Creatinine, Ser: 0.97 mg/dL (ref 0.76–1.27)
GFR calc Af Amer: 109 mL/min/{1.73_m2} (ref >59)
GFR, EST NON AFRICAN AMERICAN: 95 mL/min/{1.73_m2} (ref >59)
GLOBULIN, TOTAL: 2.6 g/dL (ref 1.5–4.5)
Glucose: 105 mg/dL — ABNORMAL HIGH (ref 65–99)
POTASSIUM: 4.2 mmol/L (ref 3.5–5.2)
SODIUM: 138 mmol/L (ref 134–144)
TOTAL PROTEIN: 7 g/dL (ref 6.0–8.5)

## 2018-01-29 LAB — LIPID PANEL WITH LDL/HDL RATIO
Cholesterol, Total: 185 mg/dL (ref 100–199)
HDL: 63 mg/dL (ref >39)
LDL Calculated: 100 mg/dL — ABNORMAL HIGH (ref 0–99)
LDl/HDL Ratio: 1.6 ratio (ref 0.0–3.6)
Triglycerides: 109 mg/dL (ref 0–149)
VLDL CHOLESTEROL CAL: 22 mg/dL (ref 5–40)

## 2018-01-29 LAB — T4, FREE: Free T4: 1.38 ng/dL (ref 0.82–1.77)

## 2018-01-29 LAB — MICROALBUMIN / CREATININE URINE RATIO
Creatinine, Urine: 135.5 mg/dL
Microalb/Creat Ratio: 2.4 mg/g creat (ref 0.0–30.0)
Microalbumin, Urine: 3.3 ug/mL

## 2018-01-29 LAB — VITAMIN D 25 HYDROXY (VIT D DEFICIENCY, FRACTURES): Vit D, 25-Hydroxy: 27 ng/mL — ABNORMAL LOW (ref 30.0–100.0)

## 2018-01-29 LAB — VITAMIN B12: VITAMIN B 12: 273 pg/mL (ref 232–1245)

## 2018-01-29 LAB — T3: T3 TOTAL: 128 ng/dL (ref 71–180)

## 2018-01-29 LAB — FOLATE: Folate: 17.5 ng/mL (ref >3.0)

## 2018-01-29 LAB — TSH: TSH: 1.33 u[IU]/mL (ref 0.450–4.500)

## 2018-01-29 NOTE — Progress Notes (Signed)
Office: [phone]  /  Fax: [phone]   Dear Dr. Parks Poole,   Thank you for referring [name] to our clinic. The following note includes my evaluation and treatment recommendations.  HPI:   Chief Complaint: OBESITY    [name] has been referred by Jon Hauser, DO for consultation regarding his obesity and obesity related comorbidities.    [name] (MR# [id number]) is a 42 y.o. male who presents on 01/29/2018 for obesity evaluation and treatment. Current BMI is Body mass index is 32.85 kg/m.[name] [name] has been struggling with his weight for many years and has been unsuccessful in either losing weight, maintaining weight loss, or reaching his healthy weight goal.     [name] attended our information session and states he is currently in the action stage of change and ready to dedicate time achieving and maintaining a healthier weight. [name] is interested in becoming our patient and working on intensive lifestyle modifications including (but not limited to) diet, exercise and weight loss.    [name] states his family eats meals together he thinks his family will eat healthier with  him his desired weight loss is 55 to 60 lbs he has been heavy most of  his life he started gaining weight around 6 or 7 yrs of age his heaviest weight ever was 280 lbs. he has significant food cravings issues  he is frequently drinking liquids with calories he frequently makes poor food choices he has problems with excessive hunger  he frequently eats larger portions than normal  he has binge eating behaviors he struggles with emotional eating    Fatigue [name] feels his energy is lower than it should be. This has worsened with weight gain and has not worsened recently. [name] admits to daytime somnolence and admits to waking up still tired. Patient is at risk for obstructive sleep apnea. Patent has a history of symptoms of daytime fatigue and morning fatigue. Patient  generally gets 8 or 9 hours of sleep per night, and states they generally have restful sleep. Snoring is present. Apneic episodes are not present. Epworth Sleepiness Score is 8  Dyspnea on exertion [name] notes increasing shortness of breath with exercising and seems to be worsening over time with weight gain. He notes getting out of breath sooner with activity than he used to. This has not gotten worse recently. [name] denies orthopnea.  Diabetes II [name] has a diagnosis of diabetes type II. [name] states fasting BGs range between 110 and 130. [name] admits polyphagia  and he denies any hypoglycemic episodes. Last A1c was at 6.7 He has been working on intensive lifestyle modifications including diet, exercise, and weight loss to help control his blood glucose levels.  At risk for cardiovascular disease [name] is at a higher than average risk for cardiovascular disease due to obesity and diabetes. He currently denies any chest pain.  Depression  [name] is on prozac and wellbutrin. Her PHQ9 equals 19. [name] is followed by psych and he notes feeling life isn't worth living approximately half the time, due to his mild obesity (BMI 32). He shows no sign of suicidal or homicidal ideations.  Depression screen [name] 2/9 01/28/2018 12/23/2017 06/24/2017 03/12/2017 10/08/2016  Decreased Interest 2 0 0 0 0  Down, Depressed, Hopeless 3 0 0 0 0  PHQ - 2 Score 5 0 0 0 0  Altered sleeping 2 0 1 0 -  Tired, decreased energy 3 1 1 1  -  Change in appetite 2 1 0  0 -  Feeling bad or failure about yourself  3 0 0 - -  Trouble concentrating 1 0 0 0 -  Moving slowly or fidgety/restless 1 0 0 0 -  Suicidal thoughts 2 0 0 0 -  PHQ-9 Score 19 2 2 1  -  Difficult doing work/chores Somewhat difficult Not difficult at all Not difficult at all - -     Depression Screen [name] Food and Mood (modified PHQ-9) score was  Depression screen PHQ 2/9 01/28/2018  Decreased Interest 2  Down, Depressed, Hopeless 3  PHQ - 2  Score 5  Altered sleeping 2  Tired, decreased energy 3  Change in appetite 2  Feeling bad or failure about yourself  3  Trouble concentrating 1  Moving slowly or fidgety/restless 1  Suicidal thoughts 2  PHQ-9 Score 19  Difficult doing work/chores Somewhat difficult    ALLERGIES: Allergies  Allergen Reactions  . Tomato Other (See Comments)    GI distress    MEDICATIONS: Current Outpatient Medications on File Prior to Visit  Medication Sig Dispense Refill  . buPROPion (WELLBUTRIN) 100 MG tablet Take 100 mg by mouth daily.    [name] esomeprazole (NEXIUM) 40 MG capsule Take 1 capsule (40 mg total) every morning by mouth. 90 capsule 3  . etodolac (LODINE) 500 MG tablet TAKE 1 TABLET (500 MG TOTAL) BY MOUTH 2 (TWO) TIMES DAILY. 60 tablet 2  . FLUoxetine (PROZAC) 20 MG capsule Take 1 capsule (20 mg total) by mouth daily. (Patient taking differently: Take 20 mg by mouth every morning. ) 90 capsule 3  . ipratropium (ATROVENT) 0.03 % nasal spray Place 2 sprays into both nostrils 2 (two) times daily.    . metFORMIN (GLUCOPHAGE-XR) 500 MG 24 hr tablet Take 2,000 mg by mouth at bedtime.   4  . Multiple Vitamin (MULTIVITAMIN) capsule Take 1 capsule by mouth daily.    . valsartan-hydrochlorothiazide (DIOVAN-HCT) 160-12.5 MG tablet Take 1 tablet by mouth daily. (Patient taking differently: Take 1 tablet by mouth every morning. ) 90 tablet 3   No current facility-administered medications on file prior to visit.     PAST MEDICAL HISTORY: Past Medical History:  Diagnosis Date  . Chest pain   . Depression   . Diabetes ([place])   . Family history of adverse reaction to anesthesia    MOM-N/V, DAD-HAS PROBLEMS WITH NOVICAINE  . Food allergy   . GERD (gastroesophageal reflux disease)   . History of hiatal hernia   . History of kidney stones    H/O  . Hypertension   . Insomnia   . Joint pain   . Plantar fasciitis   . Seasonal allergies   . Tendonitis of foot    left    PAST SURGICAL  HISTORY: Past Surgical History:  Procedure Laterality Date  . CARPAL TUNNEL RELEASE Right 07/03/2017   Procedure: CARPAL TUNNEL RELEASE;  Surgeon: Hessie Knows, MD;  Location: [facility] ORS;  Service: Orthopedics;  Laterality: Right;  . clavide surgery  2005  . KNEE SURGERY Right 2013  . SHOULDER SURGERY Right 2008    SOCIAL HISTORY: Social History   Tobacco Use  . Smoking status: Never Smoker  . Smokeless tobacco: Never Used  Substance Use Topics  . Alcohol use: Yes    Alcohol/week: 0.0 oz    Comment: RARE  . Drug use: No    FAMILY HISTORY: Family History  Problem Relation Age of Onset  . Cancer Mother        colon/rectal  .  Hyperlipidemia Mother   . Cancer Brother        h[place]  . Cancer Maternal Grandfather        lung  . Stroke Maternal Grandfather   . Diabetes Father   . Hypertension Father   . Hyperlipidemia Father   . Heart disease Father   . Alcoholism Father     ROS: Review of Systems  Constitutional: Positive for malaise/fatigue.  HENT: Positive for congestion (nasal stuffiness) and sinus pain.        Hay Fever  Eyes:       Wear Glasses or Contacts  Respiratory: Positive for shortness of breath (on exertion).   Cardiovascular: Negative for chest pain and orthopnea.  Gastrointestinal: Positive for heartburn.  Musculoskeletal:       Muscle or Joint Pain   Endo/Heme/Allergies:       Heat or Cold Intolerance Positive for polyphagia Negative for hypoglycemia  Psychiatric/Behavioral: Positive for depression. Negative for suicidal ideas.       Stress    PHYSICAL EXAM: Blood pressure 128/83, pulse 66, height 6\' 1"  (1.854 m), weight 249 lb (112.9 kg), SpO2 99 %. Body mass index is 32.85 kg/m. Physical Exam  Constitutional: He is oriented to person, place, and time. He appears well-developed and well-nourished.  HENT:  Head: Normocephalic and atraumatic.  Nose: Nose normal.  Eyes: EOM are normal. No scleral icterus.  Neck: Normal range of motion.  Neck supple. No thyromegaly present.  Cardiovascular: Normal rate and regular rhythm.  Pulmonary/Chest: Effort normal. No respiratory distress.  Abdominal: Soft. There is no tenderness.  + obesity  Musculoskeletal: Normal range of motion.  Range of Motion normal in all 4 extremities  Neurological: He is alert and oriented to person, place, and time. Coordination normal.  Skin: Skin is warm and dry.  Psychiatric: His behavior is normal. He exhibits a depressed mood.  Vitals reviewed.   RECENT LABS AND TESTS: BMET    Component Value Date/Time   NA 138 01/28/2018 1007   NA 138 05/20/2013 0815   K 4.2 01/28/2018 1007   K 3.7 05/20/2013 0815   CL 101 01/28/2018 1007   CL 106 05/20/2013 0815   CO2 21 01/28/2018 1007   CO2 26 05/20/2013 0815   GLUCOSE 105 (H) 01/28/2018 1007   GLUCOSE 101 (H) 05/20/2013 0815   BUN 18 01/28/2018 1007   BUN 20 (H) 05/20/2013 0815   CREATININE 0.97 01/28/2018 1007   CREATININE 0.99 05/20/2013 0815   CALCIUM 9.5 01/28/2018 1007   CALCIUM 8.9 05/20/2013 0815   GFRNONAA 95 01/28/2018 1007   GFRNONAA >60 05/20/2013 0815   GFRAA 109 01/28/2018 1007   GFRAA >60 05/20/2013 0815   Lab Results  Component Value Date   HGBA1C 6.2 (H) 01/28/2018   Lab Results  Component Value Date   INSULIN 10.5 01/28/2018   CBC    Component Value Date/Time   WBC 6.4 01/28/2018 1007   WBC 9.8 11/05/2011 1606   RBC 5.16 01/28/2018 1007   RBC 5.61 11/05/2011 1606   HGB 14.6 01/28/2018 1007   HCT 43.5 01/28/2018 1007   PLT 250 06/23/2017 1104   MCV 84 01/28/2018 1007   MCV 84 11/05/2011 1606   MCH 28.3 01/28/2018 1007   MCH 29.1 11/05/2011 1606   MCHC 33.6 01/28/2018 1007   MCHC 34.6 11/05/2011 1606   RDW 14.3 01/28/2018 1007   RDW 13.0 11/05/2011 1606   LYMPHSABS 1.5 01/28/2018 1007   LYMPHSABS 1.8 11/05/2011 1606  MONOABS 0.6 11/05/2011 1606   EOSABS 0.2 01/28/2018 1007   EOSABS 0.1 11/05/2011 1606   BASOSABS 0.0 01/28/2018 1007   BASOSABS 0.0  11/05/2011 1606   Iron/TIBC/Ferritin/ %Sat No results found for: IRON, TIBC, FERRITIN, IRONPCTSAT Lipid Panel     Component Value Date/Time   CHOL 185 01/28/2018 1007   TRIG 109 01/28/2018 1007   HDL 63 01/28/2018 1007   CHOLHDL 3.5 06/23/2017 1104   LDLCALC 100 (H) 01/28/2018 1007   Hepatic Function Panel     Component Value Date/Time   PROT 7.0 01/28/2018 1007   PROT 8.5 (H) 11/05/2011 1606   ALBUMIN 4.4 01/28/2018 1007   ALBUMIN 4.5 11/05/2011 1606   AST 20 01/28/2018 1007   AST 36 11/05/2011 1606   ALT 20 01/28/2018 1007   ALT 53 11/05/2011 1606   ALKPHOS 70 01/28/2018 1007   ALKPHOS 147 (H) 11/05/2011 1606   BILITOT 0.3 01/28/2018 1007   BILITOT 0.5 11/05/2011 1606      Component Value Date/Time   TSH 1.330 01/28/2018 1007   TSH 1.440 01/04/2016 0818    ECG  shows NSR with a rate of 74 BPM INDIRECT CALORIMETER done today shows a VO2 of 428 and a REE of 2982.  His calculated basal metabolic rate is 6045 thus his basal metabolic rate is better than expected.    ASSESSMENT AND PLAN: Other fatigue - Plan: EKG 12-Lead, Vitamin B12, CBC With Differential, Folate, Lipid Panel With LDL/HDL Ratio, T3, T4, free, TSH, VITAMIN D 25 Hydroxy (Vit-D Deficiency, Fractures)  Shortness of breath on exertion - Plan: CBC With Differential  Type 2 diabetes mellitus without complication, without long-term current use of insulin (HCC) - Plan: Comprehensive metabolic panel, Hemoglobin A1c, Insulin, random, Microalbumin / creatinine urine ratio  Other depression  Depression screening  At risk for heart disease  Class 1 obesity with serious comorbidity and body mass index (BMI) of 32.0 to 32.9 in adult, unspecified obesity type  PLAN: Fatigue Styles was informed that his fatigue may be related to obesity, depression or many other causes. Labs will be ordered, and in the meanwhile Journey has agreed to work on diet, exercise and weight loss to help with fatigue. Proper sleep hygiene  was discussed including the need for 7-8 hours of quality sleep each night. A sleep study was not ordered based on symptoms and Epworth score.  Dyspnea on exertion Lavaughn's shortness of breath appears to be obesity related and exercise induced. He has agreed to work on weight loss and gradually increase exercise to treat his exercise induced shortness of breath. If Markis follows our instructions and loses weight without improvement of his shortness of breath, we will plan to refer to pulmonology. We will monitor this condition regularly. [name] agrees to this plan.  Diabetes II [name] has been given extensive diabetes education by myself today including ideal fasting and post-prandial blood glucose readings, individual ideal Hgb A1c goals and hypoglycemia prevention. We discussed the importance of good blood sugar control to decrease the likelihood of diabetic complications such as nephropathy, neuropathy, limb loss, blindness, coronary artery disease, and death. We discussed the importance of intensive lifestyle modification including diet, exercise and weight loss as the first line treatment for diabetes. We will check labs and [name] agrees to start diet prescription and  follow up at the agreed upon time.  Cardiovascular risk counseling [name] was given extended (15 minutes) coronary artery disease prevention counseling today. He is 42 y.o. male and has risk factors  for heart disease including obesity and diabetes. We discussed intensive lifestyle modifications today with an emphasis on specific weight loss instructions and strategies. Pt was also informed of the importance of increasing exercise and decreasing saturated fats to help prevent heart disease.  Depression  We discussed behavior modification techniques today to help [name] deal with his depression. He will continue his medications as prescribed and we will continue to monitor. We may need to refer to psychologist at the next visit.  [name]  agreed to follow up as directed.  Depression Screen [name] had a strongly positive depression screening. Depression is commonly associated with obesity and often results in emotional eating behaviors. We will monitor this closely and work on CBT to help improve the non-hunger eating patterns. Referral to Psychology may be required if no improvement is seen as he continues in our clinic.  Obesity [name] is currently in the action stage of change and his goal is to continue with weight loss efforts. I recommend [name] begin the structured treatment plan as follows:  He has agreed to follow the Category 4 plan Christy has been instructed to eventually work up to a goal of 150 minutes of combined cardio and strengthening exercise per week for weight loss and overall health benefits. We discussed the following Behavioral Modification Strategies today: increasing lean protein intake, decreasing simple carbohydrates  and work on meal planning and easy cooking plans   He was informed of the importance of frequent follow up visits to maximize his success with intensive lifestyle modifications for his multiple health conditions. He was informed we would discuss his lab results at his next visit unless there is a critical issue that needs to be addressed sooner. Euriah agreed to keep his next visit at the agreed upon time to discuss these results.    OBESITY BEHAVIORAL INTERVENTION VISIT  Today's visit was # 1 out of 22.  Starting weight: 249 lbs Starting date: 01/28/18 Today's weight : 249 lbs  Today's date: 01/28/2018 Total lbs lost to date: 0 (Patients must lose 7 lbs in the first 6 months to continue with counseling)   ASK: We discussed the diagnosis of obesity with Glendo today and Vontae agreed to give [name] permission to discuss obesity behavioral modification therapy today.  ASSESS: Suraj has the diagnosis of obesity and his BMI today is 32.86 Fredrick is in the action stage  of change   ADVISE: Silver was educated on the multiple health risks of obesity as well as the benefit of weight loss to improve his health. He was advised of the need for long term treatment and the importance of lifestyle modifications.  AGREE: Multiple dietary modification options and treatment options were discussed and  Demont agreed to the above obesity treatment plan.   I, Doreene Nest, am acting as transcriptionist for  Dennard Nip, MD  I have reviewed the above documentation for accuracy and completeness, and I agree with the above. -Dennard Nip, MD

## 2018-02-03 ENCOUNTER — Encounter: Payer: Self-pay | Admitting: Physical Therapy

## 2018-02-03 ENCOUNTER — Ambulatory Visit: Payer: 59 | Attending: Podiatry | Admitting: Physical Therapy

## 2018-02-03 ENCOUNTER — Other Ambulatory Visit: Payer: Self-pay

## 2018-02-03 DIAGNOSIS — M6281 Muscle weakness (generalized): Secondary | ICD-10-CM | POA: Insufficient documentation

## 2018-02-03 DIAGNOSIS — R262 Difficulty in walking, not elsewhere classified: Secondary | ICD-10-CM | POA: Insufficient documentation

## 2018-02-03 DIAGNOSIS — M25572 Pain in left ankle and joints of left foot: Secondary | ICD-10-CM | POA: Diagnosis not present

## 2018-02-03 DIAGNOSIS — M62838 Other muscle spasm: Secondary | ICD-10-CM | POA: Diagnosis not present

## 2018-02-03 NOTE — Therapy (Signed)
[facility] [address] [place], [address] Phone: [phone]   Fax:  [phone]  Physical Therapy Evaluation  Patient Details  Name: [name] MRN: [id number] Date of Birth: May 02, 1976 Referring Provider: Samara Deist DPM   Encounter Date: 02/03/2018  PT End of Session - 02/03/18 0757    Visit Number  1    Number of Visits  12    Date for PT Re-Evaluation  03/17/18    PT Start Time  0733    PT Stop Time  0828    PT Time Calculation (min)  55 min    Activity Tolerance  Patient tolerated treatment well    Behavior During Therapy  [facility] for tasks assessed/performed       Past Medical History:  Diagnosis Date  . Chest pain   . Depression   . Diabetes ([place])   . Family history of adverse reaction to anesthesia    MOM-N/V, DAD-HAS PROBLEMS WITH NOVICAINE  . Food allergy   . GERD (gastroesophageal reflux disease)   . History of hiatal hernia   . History of kidney stones    H/O  . Hypertension   . Insomnia   . Joint pain   . Plantar fasciitis   . Seasonal allergies   . Tendonitis of foot    left    Past Surgical History:  Procedure Laterality Date  . CARPAL TUNNEL RELEASE Right 07/03/2017   Procedure: CARPAL TUNNEL RELEASE;  Surgeon: Hessie Knows, MD;  Location: [facility] ORS;  Service: Orthopedics;  Laterality: Right;  . clavide surgery  2005  . KNEE SURGERY Right 2013  . SHOULDER SURGERY Right 2008    There were no vitals filed for this visit.   Subjective Assessment - 02/03/18 0749    Subjective  [name] when wearing CAM walker boot left LE. Patient reports that he was doing much better when on Prednisone taper and now that he is finishing his dose he is having increased pain. Pain is described as sharp and stabbing when walking wihtout the boot. At the end of the day it is a dull throbbing ache.     Pertinent History  long history of pain in left  heel beginning about 4-5 years ago with episodes of exacerbation of symptoms each year. He has had physical therapy in the past with good results. He has a bone spur on his heel that is contributing to his chronic condition.     Limitations  Standing;Walking    How long can you walk comfortably?  <10 min without boot; able to walk as long as he wants when in the boot    Diagnostic tests  X ray: per MD notes: lateral calcaneal axial of the left heel shows the posterior exostosis. comparison to 2016 shows no major changes    Patient Stated Goals  resume normal activity with walking, working without boot    Currently in Pain?  Yes    Pain Score  2     Pain Location  Foot    Pain Orientation  Left    Pain Descriptors / Indicators  Dull;Throbbing    Pain Type  Chronic pain    Pain Onset  More than a month ago    Pain Frequency  Constant    Aggravating Factors   walking, running, standing    Pain Relieving Factors  medication, rest, ice  [facility] PT Assessment - 02/03/18 0743      Assessment   Medical Diagnosis  left Achilles tendinitis    Referring Provider  Samara Deist DPM    Onset Date/Surgical Date  12/15/17    Hand Dominance  Right    Prior Therapy  yes prior therapy multiple times over past 3-4 years      Balance Screen   Has the patient fallen in the past 6 months  No    Has the patient had a decrease in activity level because of a fear of falling?   Yes    Is the patient reluctant to leave their home because of a fear of falling?   No      Home Film/video editor residence    Living Arrangements  Spouse/significant other    Available Help at Discharge  Family    Type of [place] to enter    Entrance Stairs-Number of Steps  2    Entrance Stairs-Rails  Left    Home Layout  Two level    Alternate Level Stairs-Number of Steps  12    Alternate Level Stairs-Rails  Left    Home Equipment  None      Prior Function    Level of Independence  Independent    Vocation  Full time employment    Horticulturist, commercial, distribution, walking     Leisure  motorcycles, crossfit, running      Cognition   Overall Cognitive Status  Within Functional Limits for tasks assessed      Observation/Other Assessments   Other Surveys   -- FADI 52%      ROM / Strength   AROM / PROM / Strength  AROM;Strength      AROM   Overall AROM Comments  bilateral ankle DF WNL, PF WNL with pain on PF left ankle      Strength   Overall Strength Comments  left ankle strong and painful plantar flexion       Palpation   Palpation comment  point tender left LE posterior heel, medial > lateral with mild warmth noted medial and lateral aspect left heel      Gait: ambulating independently wearing CAM walker boot left LE Observation: left heel with increased swelling as compared to right LE    Objective measurements completed on examination: See above findings.   Treatment: Manual therapy: 10 min goal: improved soft tissue elasticity, pain  STM performed to left calf lateral aspect > medial aspect, superficial and deep techniques; patient prone lying   Modalities:  Ultrasound x 10 min: pulsed 50% 1MHz 1.3w/cm2 x 10 min to left heel/Achilles tendon with patient prone with LE supported on pilllow for pain, spasms   Patient response to treatment:  Patient with decreased pain from 2/10 to  <1/10. Patient with decreased spasms by 50% following STM.     PT Education - 02/03/18 0755    Education provided  Yes    Education Details  home instruction for use of ice, rest, gentle ROM as tolerated: POC     Person(s) Educated  Patient    Methods  Explanation;Demonstration;Verbal cues    Comprehension  Verbalized understanding;Returned demonstration;Verbal cues required          PT Long Term Goals - 02/03/18 1135      PT LONG TERM GOAL #1   Title  Patient will demonstrate improved function with daily  tasks with less pain  and impairment as indicated by FADI (foot/ankle disability index) of 40% or less     Baseline  FADI 52%    Status  New    Target Date  02/24/18      PT LONG TERM GOAL #2   Title  Patient will demonstrate improved function with daily tasks with less pain and impairment as indicated by FADI (foot/ankle disability index) of 25% or less     Baseline  FADI 52%    Status  New    Target Date  03/17/18      PT LONG TERM GOAL #3   Title  Patient will be independent in HEP for exercise and self management of pain and be able to walk without CAM walker boot with <2/10 left heel demonstrating improved function with daily tasks at home/work    Baseline  limited knowledge of pain control strategies and self managment of symptoms, exercises; ambulating with CAM walker boot    Status  New    Target Date  03/17/18             Plan - 02/03/18 1157    Clinical Impression Statement  Patient is a 42 year old male who presents with 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  He has increased pain to palpation in left calf lateral aspect with decreased soft tissue elasticity.  He is using CAM walker boot for ambulation which helps to decrease pain. He is limited in all daily activities involving standing and walking due to pain. He will benefit from physical therapy intervention to address pain, functional limitations in order to improve function and achieve goals.     History and Personal Factors relevant to plan of care:  long history of pain in left heel beginning about 4-5 years ago with episodes of exacerbation of symptoms each year. He has had physical therapy in the past with good results. He has a bone spur on his heel that is contributing to his chronic condition.     Clinical Presentation  Evolving    Clinical Presentation due to:  worsening symptoms since finishing prednisone taper    Clinical Decision Making  Moderate    Rehab Potential  Fair    Clinical Impairments Affecting Rehab  Potential  chronicity of issue, pain, bone spur identified, hx of DM    PT Frequency  2x / week    PT Duration  6 weeks    PT Treatment/Interventions  ADLs/Self Care Home Management;Cryotherapy;Iontophoresis 4mg /ml Dexamethasone;Therapeutic exercise;Balance training;Neuromuscular re-education;Patient/family education;Manual techniques;Passive range of motion;Dry needling;Taping;Electrical Stimulation;Ultrasound    PT Next Visit Plan  manual techniquec iontophoresis with Dexamethasone, isometric exercise; assess gait without boot    PT Home Exercise Plan  pain control, STM left calf, gentle sretching left calf    Consulted and Agree with Plan of Care  Patient       Patient will benefit from skilled therapeutic intervention in order to improve the following deficits and impairments:  Abnormal gait, Increased fascial restricitons, Pain, Increased muscle spasms, Decreased activity tolerance, Decreased strength, Impaired perceived functional ability, Difficulty walking  Visit Diagnosis: Pain in left ankle and joints of left foot - Plan: PT plan of care cert/re-cert  Muscle weakness (generalized) - Plan: PT plan of care cert/re-cert  Other muscle spasm - Plan: PT plan of care cert/re-cert  Difficulty in walking, not elsewhere classified - Plan: PT plan of care cert/re-cert     Problem List Patient Active Problem List  Diagnosis Date Noted  . Other fatigue 01/28/2018  . Shortness of breath on exertion 01/28/2018  . Type 2 diabetes mellitus without complication, without long-term current use of insulin ([place]) 01/28/2018  . Seasonal affective disorder ([place]) 12/23/2017  . Seasonal allergic rhinitis 06/24/2017  . Hyperlipidemia associated with type 2 diabetes mellitus ([place]) 03/12/2017  . Osteoarthritis of multiple joints 10/08/2016  . Pain of right thumb 05/10/2016  . Plantar fasciitis 02/25/2016  . Achilles tendinitis 02/25/2016  . Type 2 diabetes mellitus with other specified complication  ([place]) 28/41/3244  . GERD (gastroesophageal reflux disease) 01/02/2016  . Essential hypertension 06/27/2015  . Depression 06/27/2015    Jomarie Longs PT 02/03/2018, 10:04 PM  [facility] [address], [place], [address] Phone: [phone]   Fax:  [phone]  Name: [name] MRN: [id number] Date of Birth: 1975/11/29

## 2018-02-05 ENCOUNTER — Encounter: Payer: Self-pay | Admitting: Physical Therapy

## 2018-02-05 ENCOUNTER — Ambulatory Visit: Payer: 59 | Admitting: Physical Therapy

## 2018-02-05 DIAGNOSIS — R262 Difficulty in walking, not elsewhere classified: Secondary | ICD-10-CM | POA: Diagnosis not present

## 2018-02-05 DIAGNOSIS — M62838 Other muscle spasm: Secondary | ICD-10-CM | POA: Diagnosis not present

## 2018-02-05 DIAGNOSIS — M25572 Pain in left ankle and joints of left foot: Secondary | ICD-10-CM

## 2018-02-05 DIAGNOSIS — M6281 Muscle weakness (generalized): Secondary | ICD-10-CM | POA: Diagnosis not present

## 2018-02-05 NOTE — Therapy (Signed)
[place] [facility] [address], [place], [address] Phone: [phone]   Fax:  [phone]  Physical Therapy Treatment  Patient Details  Name: [name] MRN: [id number] Date of Birth: 1976/08/27 Referring Provider: Samara Deist DPM   Encounter Date: 02/05/2018  PT End of Session - 02/05/18 0808    Visit Number  2    Number of Visits  12    Date for PT Re-Evaluation  03/17/18    PT Start Time  0728    PT Stop Time  0818    PT Time Calculation (min)  50 min    Activity Tolerance  Patient tolerated treatment well    Behavior During Therapy  [facility] for tasks assessed/performed       Past Medical History:  Diagnosis Date  . Chest pain   . Depression   . Diabetes ([place])   . Family history of adverse reaction to anesthesia    MOM-N/V, DAD-HAS PROBLEMS WITH NOVICAINE  . Food allergy   . GERD (gastroesophageal reflux disease)   . History of hiatal hernia   . History of kidney stones    H/O  . Hypertension   . Insomnia   . Joint pain   . Plantar fasciitis   . Seasonal allergies   . Tendonitis of foot    left    Past Surgical History:  Procedure Laterality Date  . CARPAL TUNNEL RELEASE Right 07/03/2017   Procedure: CARPAL TUNNEL RELEASE;  Surgeon: Hessie Knows, MD;  Location: [facility] ORS;  Service: Orthopedics;  Laterality: Right;  . clavide surgery  2005  . KNEE SURGERY Right 2013  . SHOULDER SURGERY Right 2008    There were no vitals filed for this visit.  Subjective Assessment - 02/05/18 0731    Subjective  Patient reports he felt some better following previous session and is noticing more pain now that he has finished his Prednisone taper.     Pertinent History  long history of pain in left heel beginning about 4-5 years ago with episodes of exacerbation of symptoms each year. He has had physical therapy in the past with good results. He has a bone spur on his heel that is contributing to his  chronic condition.     Limitations  Standing;Walking    How long can you walk comfortably?  <10 min without boot; able to walk as long as he wants when in the boot    Diagnostic tests  X ray: per MD notes: lateral calcaneal axial of the left heel shows the posterior exostosis. comparison to 2016 shows no major changes    Patient Stated Goals  resume normal activity with walking, working without boot    Currently in Pain?  Yes    Pain Score  2     Pain Location  Foot    Pain Orientation  Left    Pain Descriptors / Indicators  Dull;Throbbing    Pain Onset  More than a month ago    Pain Frequency  Constant       Observation:  Gait: ambulating independently wearing CAM walker boot left LE Observation: left heel with increased swelling as compared to right LE Strength: left LE decreased hip ER 4-/5; extension 4-/5; gluteal muscle strength 4-/5 Girth measurements: calf 7" below inferior point of patella each LE: right 16.5"; left 16"  Treatment: Manual therapy: 10 min goal: improved soft tissue elasticity, pain  STM performed to left calf lateral aspect >  medial aspect, superficial and deep techniques; patient prone lying   Modalities:  Ultrasound x 8 min: pulsed 50% 1MHz 1.3w/cm2 x 10 min to left heel/Achilles tendon with patient prone with LE supported on pilllow for pain, spasms  Iontophoresis with dexamethasone 4mg /ml @ 58ma*min; (63min to apply) small electrode to posterior aspect of heel left LE with patient prone lying; no adverse reactions noted  Electrical stimulation: 15 min: Russian stim. 10/10 cycle applied (2) electrodes to left calf for muscle re education with patient prone with left LE supported on pillow; goal muscle re education  Patient response to treatment: improved soft tissue elasticity left calf 30% with patient reporting less stiffness, discomfort in left LE following treatment.      PT Education - 02/05/18 0746    Education provided  Yes    Education  Details  instructed in iontophoresis treatment, possible reactions, patient consented to treatment; HEP hip extension prone with knee extended and flexed, side lying clamshells with resistive band; side lying hip abduction   Person(s) Educated  Patient    Methods  Explanation    Comprehension  Verbalized understanding          PT Long Term Goals - 02/03/18 1135      PT LONG TERM GOAL #1   Title  Patient will demonstrate improved function with daily tasks with less pain and impairment as indicated by FADI (foot/ankle disability index) of 40% or less     Baseline  FADI 52%    Status  New    Target Date  02/24/18      PT LONG TERM GOAL #2   Title  Patient will demonstrate improved function with daily tasks with less pain and impairment as indicated by FADI (foot/ankle disability index) of 25% or less     Baseline  FADI 52%    Status  New    Target Date  03/17/18      PT LONG TERM GOAL #3   Title  Patient will be independent in HEP for exercise and self management of pain and be able to walk without CAM walker boot with <2/10 left heel demonstrating improved function with daily tasks at home/work    Baseline  limited knowledge of pain control strategies and self managment of symptoms, exercises; ambulating with CAM walker boot    Status  New    Target Date  03/17/18            Plan - 02/05/18 0809    Clinical Impression Statement  Patient tolerated session well without adverse reactions. He has decreased strength in left hip musculature and will require exercise to strengthen gluteal muscles as he is walking in CAM boot.     Rehab Potential  Fair    Clinical Impairments Affecting Rehab Potential  chronicity of issue, pain, bone spur identified, hx of DM    PT Frequency  2x / week    PT Duration  6 weeks    PT Treatment/Interventions  ADLs/Self Care Home Management;Cryotherapy;Iontophoresis 4mg /ml Dexamethasone;Therapeutic exercise;Balance training;Neuromuscular  re-education;Patient/family education;Manual techniques;Passive range of motion;Dry needling;Taping;Electrical Stimulation;Ultrasound    PT Next Visit Plan  manual techniquec iontophoresis with Dexamethasone, isometric exercise; assess gait without boot    PT Home Exercise Plan  pain control, STM left calf, gentle sretching left calf       Patient will benefit from skilled therapeutic intervention in order to improve the following deficits and impairments:  Abnormal gait, Increased fascial restricitons, Pain, Increased muscle spasms, Decreased activity tolerance,  Decreased strength, Impaired perceived functional ability, Difficulty walking  Visit Diagnosis: Pain in left ankle and joints of left foot  Muscle weakness (generalized)  Other muscle spasm  Difficulty in walking, not elsewhere classified     Problem List Patient Active Problem List   Diagnosis Date Noted  . Other fatigue 01/28/2018  . Shortness of breath on exertion 01/28/2018  . Type 2 diabetes mellitus without complication, without long-term current use of insulin ([place]) 01/28/2018  . Seasonal affective disorder ([place]) 12/23/2017  . Seasonal allergic rhinitis 06/24/2017  . Hyperlipidemia associated with type 2 diabetes mellitus ([place]) 03/12/2017  . Osteoarthritis of multiple joints 10/08/2016  . Pain of right thumb 05/10/2016  . Plantar fasciitis 02/25/2016  . Achilles tendinitis 02/25/2016  . Type 2 diabetes mellitus with other specified complication ([place]) 66/44/0347  . GERD (gastroesophageal reflux disease) 01/02/2016  . Essential hypertension 06/27/2015  . Depression 06/27/2015    Jomarie Longs PT 02/05/2018, 9:24 PM  [place] [facility] [address], [place], [address] Phone: [phone]   Fax:  [phone]  Name: DAELIN HASTE MRN: [id number] Date of Birth: 05-29-1976

## 2018-02-10 ENCOUNTER — Ambulatory Visit: Payer: 59 | Admitting: Physical Therapy

## 2018-02-10 ENCOUNTER — Encounter: Payer: Self-pay | Admitting: Physical Therapy

## 2018-02-10 DIAGNOSIS — M6281 Muscle weakness (generalized): Secondary | ICD-10-CM | POA: Diagnosis not present

## 2018-02-10 DIAGNOSIS — M62838 Other muscle spasm: Secondary | ICD-10-CM

## 2018-02-10 DIAGNOSIS — R262 Difficulty in walking, not elsewhere classified: Secondary | ICD-10-CM | POA: Diagnosis not present

## 2018-02-10 DIAGNOSIS — M25572 Pain in left ankle and joints of left foot: Secondary | ICD-10-CM

## 2018-02-10 NOTE — Therapy (Signed)
[facility] [address], [place], [address] Phone: [phone]   Fax:  [phone]  Physical Therapy Treatment  Patient Details  Name: [name] MRN: [id number] Date of Birth: [birth year] Referring Provider: Samara Deist DPM   Encounter Date: 02/10/2018  PT End of Session - 02/10/18 0812    Visit Number  3    Number of Visits  12    Date for PT Re-Evaluation  03/17/18    PT Start Time  0733    PT Stop Time  0817    PT Time Calculation (min)  44 min    Activity Tolerance  Patient tolerated treatment well    Behavior During Therapy  [facility] for tasks assessed/performed       Past Medical History:  Diagnosis Date  . Chest pain   . Depression   . Diabetes ([place])   . Family history of adverse reaction to anesthesia    MOM-N/V, DAD-HAS PROBLEMS WITH NOVICAINE  . Food allergy   . GERD (gastroesophageal reflux disease)   . History of hiatal hernia   . History of kidney stones    H/O  . Hypertension   . Insomnia   . Joint pain   . Plantar fasciitis   . Seasonal allergies   . Tendonitis of foot    left    Past Surgical History:  Procedure Laterality Date  . CARPAL TUNNEL RELEASE Right 07/03/2017   Procedure: CARPAL TUNNEL RELEASE;  Surgeon: Hessie Knows, MD;  Location: [facility] ORS;  Service: Orthopedics;  Laterality: Right;  . clavide surgery  2005  . KNEE SURGERY Right 2013  . SHOULDER SURGERY Right 2008    There were no vitals filed for this visit.  Subjective Assessment - 02/10/18 0735    Subjective  Patient reports he is doing better with 1-2/10 pain     Pertinent History  long history of pain in left heel beginning about 4-5 years ago with episodes of exacerbation of symptoms each year. He has had physical therapy in the past with good results. He has a bone spur on his heel that is contributing to his chronic condition.     Limitations  Standing;Walking    How long can you walk  comfortably?  <10 min without boot; able to walk as long as he wants when in the boot    Diagnostic tests  X ray: per MD notes: lateral calcaneal axial of the left heel shows the posterior exostosis. comparison to 2016 shows no major changes    Patient Stated Goals  resume normal activity with walking, working without boot    Currently in Pain?  Yes    Pain Score  2     Pain Location  Foot    Pain Orientation  Left    Pain Descriptors / Indicators  Aching    Pain Type  Chronic pain    Pain Onset  More than a month ago    Pain Frequency  Constant         Observation:  Observation: left heel with mild swelling as compared to right LE Palpation: spasms with decreased soft tissue elasticity along lateral calf and evertors left LE   Treatment: Manual therapy: 8 min goal: improved soft tissue elasticity, pain  STM performed to left calf lateral aspect > medial aspect, superficial and deep techniques; patient prone lying    Modalities:  Ultrasound: pulsed 50% 1MHz 1.3w/cm2 x 10 min to left heel/Achilles  tendon with patient prone with LE supported on pilllow for pain, spasms   Iontophoresis with dexamethasone 4mg /ml @ 44ma*min; (41min to apply) small electrode to posterior aspect of heel left LE with patient prone lying; no adverse reactions noted   Electrical stimulation: 15 min: Russian stim. 10/10 cycle applied (2) electrodes to left calf for muscle re education with patient prone with left LE supported on pillow; goal muscle re education   Patient response to treatment:  Patient with improved soft tissue elasticity with decreased spasms by 30% following STM. No adverse reaction to modalities     PT Education - 02/10/18 [phone]    Education provided  Yes    Education Details  continue with isometric calf strengthening for PF, gentle stretching    Person(s) Educated  Patient    Methods  Explanation    Comprehension  Verbalized understanding          PT Long Term Goals - 02/03/18  1135      PT LONG TERM GOAL #1   Title  Patient will demonstrate improved function with daily tasks with less pain and impairment as indicated by FADI (foot/ankle disability index) of 40% or less     Baseline  FADI 52%    Status  New    Target Date  02/24/18      PT LONG TERM GOAL #2   Title  Patient will demonstrate improved function with daily tasks with less pain and impairment as indicated by FADI (foot/ankle disability index) of 25% or less     Baseline  FADI 52%    Status  New    Target Date  03/17/18      PT LONG TERM GOAL #3   Title  Patient will be independent in HEP for exercise and self management of pain and be able to walk without CAM walker boot with <2/10 left heel demonstrating improved function with daily tasks at home/work    Baseline  limited knowledge of pain control strategies and self managment of symptoms, exercises; ambulating with CAM walker boot    Status  New    Target Date  03/17/18            Plan - 02/10/18 0813    Clinical Impression Statement  Patient demonstrates good progress with treatment and towards goals. He has decreased intensity of pain and describes pain as dull not sharp now. He should continue to progress with additional physical therapy intervention.     Rehab Potential  Fair    Clinical Impairments Affecting Rehab Potential  chronicity of issue, pain, bone spur identified, hx of DM    PT Frequency  2x / week    PT Duration  6 weeks    PT Treatment/Interventions  ADLs/Self Care Home Management;Cryotherapy;Iontophoresis 4mg /ml Dexamethasone;Therapeutic exercise;Balance training;Neuromuscular re-education;Patient/family education;Manual techniques;Passive range of motion;Dry needling;Taping;Electrical Stimulation;Ultrasound    PT Next Visit Plan  manual techniquec iontophoresis with Dexamethasone, isometric exercise; assess gait without boot    PT Home Exercise Plan  pain control, STM left calf, gentle sretching left calf       Patient  will benefit from skilled therapeutic intervention in order to improve the following deficits and impairments:  Abnormal gait, Increased fascial restricitons, Pain, Increased muscle spasms, Decreased activity tolerance, Decreased strength, Impaired perceived functional ability, Difficulty walking  Visit Diagnosis: Pain in left ankle and joints of left foot  Muscle weakness (generalized)  Other muscle spasm     Problem List Patient Active Problem List  Diagnosis Date Noted  . Other fatigue 01/28/2018  . Shortness of breath on exertion 01/28/2018  . Type 2 diabetes mellitus without complication, without long-term current use of insulin ([place]) 01/28/2018  . Seasonal affective disorder ([place]) 12/23/2017  . Seasonal allergic rhinitis 06/24/2017  . Hyperlipidemia associated with type 2 diabetes mellitus ([place]) 03/12/2017  . Osteoarthritis of multiple joints 10/08/2016  . Pain of right thumb 05/10/2016  . Plantar fasciitis 02/25/2016  . Achilles tendinitis 02/25/2016  . Type 2 diabetes mellitus with other specified complication ([place]) 48/09/6551  . GERD (gastroesophageal reflux disease) 01/02/2016  . Essential hypertension 06/27/2015  . Depression 06/27/2015    Jomarie Longs PT 02/10/2018, 8:58 AM  [facility] [address], [place], [address] Phone: [phone]   Fax:  [phone]  Name: [name] MRN: [id number] Date of Birth: Mar 30, 1976

## 2018-02-12 ENCOUNTER — Encounter: Payer: Self-pay | Admitting: Physical Therapy

## 2018-02-12 ENCOUNTER — Ambulatory Visit: Payer: 59 | Admitting: Physical Therapy

## 2018-02-12 DIAGNOSIS — R262 Difficulty in walking, not elsewhere classified: Secondary | ICD-10-CM

## 2018-02-12 DIAGNOSIS — M6281 Muscle weakness (generalized): Secondary | ICD-10-CM | POA: Diagnosis not present

## 2018-02-12 DIAGNOSIS — M25572 Pain in left ankle and joints of left foot: Secondary | ICD-10-CM

## 2018-02-12 DIAGNOSIS — M62838 Other muscle spasm: Secondary | ICD-10-CM | POA: Diagnosis not present

## 2018-02-13 NOTE — Therapy (Signed)
[facility] [address], [place], [address] Phone: [phone]   Fax:  [phone]  Physical Therapy Treatment  Patient Details  Name: [name] MRN: [id number] Date of Birth: May 06, 1976 Referring Provider: Samara Deist DPM   Encounter Date: 02/12/2018  PT End of Session - 02/12/18 2359    Visit Number  4    Number of Visits  12    Date for PT Re-Evaluation  03/17/18    PT Start Time  0731    PT Stop Time  0821    PT Time Calculation (min)  50 min    Activity Tolerance  Patient tolerated treatment well    Behavior During Therapy  [facility] for tasks assessed/performed       Past Medical History:  Diagnosis Date  . Chest pain   . Depression   . Diabetes ([place])   . Family history of adverse reaction to anesthesia    MOM-N/V, DAD-HAS PROBLEMS WITH NOVICAINE  . Food allergy   . GERD (gastroesophageal reflux disease)   . History of hiatal hernia   . History of kidney stones    H/O  . Hypertension   . Insomnia   . Joint pain   . Plantar fasciitis   . Seasonal allergies   . Tendonitis of foot    left    Past Surgical History:  Procedure Laterality Date  . CARPAL TUNNEL RELEASE Right 07/03/2017   Procedure: CARPAL TUNNEL RELEASE;  Surgeon: Hessie Knows, MD;  Location: [facility] ORS;  Service: Orthopedics;  Laterality: Right;  . clavide surgery  2005  . KNEE SURGERY Right 2013  . SHOULDER SURGERY Right 2008    There were no vitals filed for this visit.  Subjective Assessment - 02/12/18 0737    Subjective  Patient reports he is doing much better and was able to walk and work some without boot on and did not have incresaed pain in left heel. Overall improving with current treatment.    Pertinent History  long history of pain in left heel beginning about 4-5 years ago with episodes of exacerbation of symptoms each year. He has had physical therapy in the past with good results. He has a bone spur on  his heel that is contributing to his chronic condition.     Limitations  Standing;Walking    How long can you walk comfortably?  <10 min without boot; able to walk as long as he wants when in the boot    Diagnostic tests  X ray: per MD notes: lateral calcaneal axial of the left heel shows the posterior exostosis. comparison to 2016 shows no major changes    Patient Stated Goals  resume normal activity with walking, working without boot    Currently in Pain?  Yes    Pain Score  1     Pain Location  Heel    Pain Orientation  Left    Pain Descriptors / Indicators  Aching    Pain Type  Chronic pain    Pain Onset  More than a month ago    Pain Frequency  Intermittent        Observation:  Observation: left heel with no significant swelling as compared to right LE Palpation: spasms with decreased soft tissue elasticity along lateral calf and evertors left LE   Treatment: Manual therapy: 10 min goal: improved soft tissue elasticity, pain  STM performed to left calf lateral aspect > medial aspect, superficial  and deep techniques; patient prone lying    Modalities:  Ultrasound: pulsed 50% 1MHz 1.3w/cm2 x 10 min to left heel/Achilles tendon with patient prone with LE supported on pilllow for pain, spasms   Iontophoresis with dexamethasone 4mg /ml @ 9ma*min; (66min to apply) small electrode to posterior aspect of heel left LE with patient prone lying; no adverse reactions noted   Electrical stimulation: 15 min: Russian stim. 10/10 cycle applied (2) electrodes to left calf for muscle re education with patient prone with left LE supported on pillow; goal muscle re education   Patient response to treatment:  improved soft tissue elasticity by 30% No adverse reaction to modalities      PT Education - 02/12/18 0801    Education provided  Yes    Education Details  HEP re assessed    Person(s) Educated  Patient    Methods  Explanation    Comprehension  Verbalized understanding           PT Long Term Goals - 02/03/18 1135      PT LONG TERM GOAL #1   Title  Patient will demonstrate improved function with daily tasks with less pain and impairment as indicated by FADI (foot/ankle disability index) of 40% or less     Baseline  FADI 52%    Status  New    Target Date  02/24/18      PT LONG TERM GOAL #2   Title  Patient will demonstrate improved function with daily tasks with less pain and impairment as indicated by FADI (foot/ankle disability index) of 25% or less     Baseline  FADI 52%    Status  New    Target Date  03/17/18      PT LONG TERM GOAL #3   Title  Patient will be independent in HEP for exercise and self management of pain and be able to walk without CAM walker boot with <2/10 left heel demonstrating improved function with daily tasks at home/work    Baseline  limited knowledge of pain control strategies and self managment of symptoms, exercises; ambulating with CAM walker boot    Status  New    Target Date  03/17/18            Plan - 02/12/18 0830    Clinical Impression Statement  Patient is progressing steadily towards goals with decreasing pain and imrpovement with function with standing and walking. He continues with decreased strength left calf and intermittent discomfort left heel and will benefit from continued physical therapy intervention.   Rehab Potential  Fair    Clinical Impairments Affecting Rehab Potential  chronicity of issue, pain, bone spur identified, hx of DM    PT Frequency  2x / week    PT Duration  6 weeks    PT Treatment/Interventions  ADLs/Self Care Home Management;Cryotherapy;Iontophoresis 4mg /ml Dexamethasone;Therapeutic exercise;Balance training;Neuromuscular re-education;Patient/family education;Manual techniques;Passive range of motion;Dry needling;Taping;Electrical Stimulation;Ultrasound    PT Next Visit Plan  manual techniquec iontophoresis with Dexamethasone, isometric exercise; assess gait without boot    PT Home  Exercise Plan  pain control, STM left calf, gentle sretching left calf       Patient will benefit from skilled therapeutic intervention in order to improve the following deficits and impairments:  Abnormal gait, Increased fascial restricitons, Pain, Increased muscle spasms, Decreased activity tolerance, Decreased strength, Impaired perceived functional ability, Difficulty walking  Visit Diagnosis: Pain in left ankle and joints of left foot  Muscle weakness (generalized)  Other muscle spasm  Difficulty in walking, not elsewhere classified     Problem List Patient Active Problem List   Diagnosis Date Noted  . Other fatigue 01/28/2018  . Shortness of breath on exertion 01/28/2018  . Type 2 diabetes mellitus without complication, without long-term current use of insulin ([place]) 01/28/2018  . Seasonal affective disorder ([place]) 12/23/2017  . Seasonal allergic rhinitis 06/24/2017  . Hyperlipidemia associated with type 2 diabetes mellitus ([place]) 03/12/2017  . Osteoarthritis of multiple joints 10/08/2016  . Pain of right thumb 05/10/2016  . Plantar fasciitis 02/25/2016  . Achilles tendinitis 02/25/2016  . Type 2 diabetes mellitus with other specified complication ([place]) 33/35/4562  . GERD (gastroesophageal reflux disease) 01/02/2016  . Essential hypertension 06/27/2015  . Depression 06/27/2015    Jomarie Longs PT 02/13/2018, 8:01 AM  [facility] [address], [place], [address] Phone: [phone]   Fax:  [phone]  Name: [name] MRN: [id number] Date of Birth: May 27, 1976

## 2018-02-17 ENCOUNTER — Ambulatory Visit (INDEPENDENT_AMBULATORY_CARE_PROVIDER_SITE_OTHER): Payer: 59 | Admitting: Family Medicine

## 2018-02-17 ENCOUNTER — Ambulatory Visit: Payer: 59 | Attending: Podiatry | Admitting: Physical Therapy

## 2018-02-17 ENCOUNTER — Encounter: Payer: Self-pay | Admitting: Physical Therapy

## 2018-02-17 VITALS — BP 117/76 | HR 77 | Temp 98.1°F | Ht 73.0 in | Wt 248.0 lb

## 2018-02-17 DIAGNOSIS — Z6832 Body mass index (BMI) 32.0-32.9, adult: Secondary | ICD-10-CM | POA: Diagnosis not present

## 2018-02-17 DIAGNOSIS — M6281 Muscle weakness (generalized): Secondary | ICD-10-CM | POA: Insufficient documentation

## 2018-02-17 DIAGNOSIS — M25572 Pain in left ankle and joints of left foot: Secondary | ICD-10-CM | POA: Diagnosis not present

## 2018-02-17 DIAGNOSIS — E119 Type 2 diabetes mellitus without complications: Secondary | ICD-10-CM | POA: Diagnosis not present

## 2018-02-17 DIAGNOSIS — Z9189 Other specified personal risk factors, not elsewhere classified: Secondary | ICD-10-CM

## 2018-02-17 DIAGNOSIS — R262 Difficulty in walking, not elsewhere classified: Secondary | ICD-10-CM | POA: Diagnosis not present

## 2018-02-17 DIAGNOSIS — M62838 Other muscle spasm: Secondary | ICD-10-CM | POA: Insufficient documentation

## 2018-02-17 DIAGNOSIS — E669 Obesity, unspecified: Secondary | ICD-10-CM

## 2018-02-17 DIAGNOSIS — E559 Vitamin D deficiency, unspecified: Secondary | ICD-10-CM

## 2018-02-17 NOTE — Therapy (Signed)
[facility] [address], [place], [address] Phone: [phone]   Fax:  [phone]  Physical Therapy Treatment  Patient Details  Name: [name] MRN: [id number] Date of Birth: August 22, 1976 Referring Provider: Samara Deist DPM   Encounter Date: 02/17/2018  PT End of Session - 02/17/18 0813    Visit Number  5    Number of Visits  12    Date for PT Re-Evaluation  03/17/18    PT Start Time  0733    PT Stop Time  0823    PT Time Calculation (min)  50 min    Activity Tolerance  Patient tolerated treatment well    Behavior During Therapy  [facility] for tasks assessed/performed       Past Medical History:  Diagnosis Date  . Chest pain   . Depression   . Diabetes ([place])   . Family history of adverse reaction to anesthesia    MOM-N/V, DAD-HAS PROBLEMS WITH NOVICAINE  . Food allergy   . GERD (gastroesophageal reflux disease)   . History of hiatal hernia   . History of kidney stones    H/O  . Hypertension   . Insomnia   . Joint pain   . Plantar fasciitis   . Seasonal allergies   . Tendonitis of foot    left    Past Surgical History:  Procedure Laterality Date  . CARPAL TUNNEL RELEASE Right 07/03/2017   Procedure: CARPAL TUNNEL RELEASE;  Surgeon: Hessie Knows, MD;  Location: [facility] ORS;  Service: Orthopedics;  Laterality: Right;  . clavide surgery  2005  . KNEE SURGERY Right 2013  . SHOULDER SURGERY Right 2008    There were no vitals filed for this visit.  Subjective Assessment - 02/17/18 0737    Subjective  Patient continues to reports improvement with decreasing heel pain and beginning to walk without boot with less difficutly and pain. He still gets irritated in his foot/heel with prolonged walking.     Pertinent History  long history of pain in left heel beginning about 4-5 years ago with episodes of exacerbation of symptoms each year. He has had physical therapy in the past with good results. He  has a bone spur on his heel that is contributing to his chronic condition.     Limitations  Standing;Walking    How long can you walk comfortably?  <10 min without boot; able to walk as long as he wants when in the boot    Diagnostic tests  X ray: per MD notes: lateral calcaneal axial of the left heel shows the posterior exostosis. comparison to 2016 shows no major changes    Patient Stated Goals  resume normal activity with walking, working without boot    Currently in Pain?  No/denies         Observation: Observation: left heel withno significantswelling as compared to right LE Palpation: spasms with decreased soft tissue elasticity along lateral calf and evertors left LE, improved from previous session Girth measurements: calf 7" below patella: right 16.5"; left 15.5"  Treatment: Manual therapy: 10 min goal: improved soft tissue elasticity, pain  STM performed to left calf lateral aspect >medial aspect, superficial and deep techniques; patient prone lying   Modalities:  Ultrasound: pulsed 50% 1MHz 1.3w/cm2 x 10 min to left heel/Achilles tendon with patient prone with LE supported on pilllow for pain, spasms  Iontophoresis with dexamethasone 4mg /ml @ 32ma*min; (66min to apply) small electrode to posterior aspect  of heel left LE with patient prone lying; no adverse reactions noted  Electrical stimulation:15 FOY:DXAJOIN stim. 10/10 cycle applied (2) electrodes toleft calf for muscle re education with patient prone with left LE supportedon pillow; goal muscle re education  Patient response to treatment:improved soft tissue elasticity by 30% No adverse reaction to modalities      PT Education - 02/17/18 0800    Education provided  Yes    Education Details  Home program to continue: self STM to calf, begin active PF sitting and with resistive bands in sitting    Person(s) Educated  Patient    Methods  Explanation;Demonstration    Comprehension  Verbalized  understanding          PT Long Term Goals - 02/03/18 1135      PT LONG TERM GOAL #1   Title  Patient will demonstrate improved function with daily tasks with less pain and impairment as indicated by FADI (foot/ankle disability index) of 40% or less     Baseline  FADI 52%    Status  New    Target Date  02/24/18      PT LONG TERM GOAL #2   Title  Patient will demonstrate improved function with daily tasks with less pain and impairment as indicated by FADI (foot/ankle disability index) of 25% or less     Baseline  FADI 52%    Status  New    Target Date  03/17/18      PT LONG TERM GOAL #3   Title  Patient will be independent in HEP for exercise and self management of pain and be able to walk without CAM walker boot with <2/10 left heel demonstrating improved function with daily tasks at home/work    Baseline  limited knowledge of pain control strategies and self managment of symptoms, exercises; ambulating with CAM walker boot    Status  New    Target Date  03/17/18            Plan - 02/17/18 0813    Clinical Impression Statement  Patient is progressing well towards goals and is able to stand and walk without boot for short periods of time without exacerbation of symtpoms. He is responding well to current physical therapy interventions and should continue to improve with continued physical therapy treatment.    Rehab Potential  Fair    Clinical Impairments Affecting Rehab Potential  chronicity of issue, pain, bone spur identified, hx of DM    PT Frequency  2x / week    PT Duration  6 weeks    PT Treatment/Interventions  ADLs/Self Care Home Management;Cryotherapy;Iontophoresis 4mg /ml Dexamethasone;Therapeutic exercise;Balance training;Neuromuscular re-education;Patient/family education;Manual techniques;Passive range of motion;Dry needling;Taping;Electrical Stimulation;Ultrasound    PT Next Visit Plan  manual techniquec iontophoresis with Dexamethasone, isometric exercise; assess  gait without boot    PT Home Exercise Plan  pain control, STM left calf, gentle sretching left calf       Patient will benefit from skilled therapeutic intervention in order to improve the following deficits and impairments:  Abnormal gait, Increased fascial restricitons, Pain, Increased muscle spasms, Decreased activity tolerance, Decreased strength, Impaired perceived functional ability, Difficulty walking  Visit Diagnosis: Pain in left ankle and joints of left foot  Muscle weakness (generalized)  Other muscle spasm  Difficulty in walking, not elsewhere classified     Problem List Patient Active Problem List   Diagnosis Date Noted  . Other fatigue 01/28/2018  . Shortness of breath on exertion 01/28/2018  .  Type 2 diabetes mellitus without complication, without long-term current use of insulin ([place]) 01/28/2018  . Seasonal affective disorder ([place]) 12/23/2017  . Seasonal allergic rhinitis 06/24/2017  . Hyperlipidemia associated with type 2 diabetes mellitus ([place]) 03/12/2017  . Osteoarthritis of multiple joints 10/08/2016  . Pain of right thumb 05/10/2016  . Plantar fasciitis 02/25/2016  . Achilles tendinitis 02/25/2016  . Type 2 diabetes mellitus with other specified complication ([place]) 20/94/7096  . GERD (gastroesophageal reflux disease) 01/02/2016  . Essential hypertension 06/27/2015  . Depression 06/27/2015    Jomarie Longs PT 02/17/2018, 9:35 PM  [facility] [address], [place], [address] Phone: [phone]   Fax:  [phone]  Name: [name] MRN: [id number] Date of Birth: March 04, 1976

## 2018-02-17 NOTE — Progress Notes (Signed)
Office: [phone]  /  Fax: [phone]   HPI:   Chief Complaint: OBESITY [name] is here to discuss his progress with his obesity treatment plan. He is on the Category 4 plan and is following his eating plan approximately 75-80 % of the time. He states he is exercising 0 minutes 0 times per week. [name] did well with Category 4 plan and lost some weight in last 2 weeks. Hunger was controlled but he didn't like all his diary options.  His weight is 248 lb (112.5 kg) today and has had a weight loss of 1 pound over a period of 3 weeks since his last visit. He has lost 1 lbs since starting treatment with [name].  Vitamin D Deficiency [name] has a new diagnosis of vitamin D deficiency. He is not currently taking Vit D. He notes fatigue and denies nausea, vomiting or muscle weakness.  Diabetes II [name] has a diagnosis of diabetes type II. [name] is on metformin, well controlled, and A1c at 6.2. Polyphagia improved on diet prescription, and he denies hypoglycemia. He has been working on intensive lifestyle modifications including diet, exercise, and weight loss to help control his blood glucose levels.  At risk for cardiovascular disease [name] is at a higher than average risk for cardiovascular disease due to obesity and diabetes II. He currently denies any chest pain.  ALLERGIES: Allergies  Allergen Reactions  . Tomato Other (See Comments)    GI distress    MEDICATIONS: Current Outpatient Medications on File Prior to Visit  Medication Sig Dispense Refill  . buPROPion (WELLBUTRIN) 100 MG tablet Take 100 mg by mouth daily.    [name] esomeprazole (NEXIUM) 40 MG capsule Take 1 capsule (40 mg total) every morning by mouth. 90 capsule 3  . etodolac (LODINE) 500 MG tablet TAKE 1 TABLET (500 MG TOTAL) BY MOUTH 2 (TWO) TIMES DAILY. 60 tablet 2  . FLUoxetine (PROZAC) 20 MG capsule Take 1 capsule (20 mg total) by mouth daily. (Patient taking differently: Take 20 mg by mouth every morning. ) 90 capsule  3  . ipratropium (ATROVENT) 0.03 % nasal spray Place 2 sprays into both nostrils 2 (two) times daily.    . metFORMIN (GLUCOPHAGE-XR) 500 MG 24 hr tablet Take 2,000 mg by mouth at bedtime.   4  . Multiple Vitamin (MULTIVITAMIN) capsule Take 1 capsule by mouth daily.    . valsartan-hydrochlorothiazide (DIOVAN-HCT) 160-12.5 MG tablet Take 1 tablet by mouth daily. (Patient taking differently: Take 1 tablet by mouth every morning. ) 90 tablet 3   No current facility-administered medications on file prior to visit.     PAST MEDICAL HISTORY: Past Medical History:  Diagnosis Date  . Chest pain   . Depression   . Diabetes ([place])   . Family history of adverse reaction to anesthesia    MOM-N/V, DAD-HAS PROBLEMS WITH NOVICAINE  . Food allergy   . GERD (gastroesophageal reflux disease)   . History of hiatal hernia   . History of kidney stones    H/O  . Hypertension   . Insomnia   . Joint pain   . Plantar fasciitis   . Seasonal allergies   . Tendonitis of foot    left    PAST SURGICAL HISTORY: Past Surgical History:  Procedure Laterality Date  . CARPAL TUNNEL RELEASE Right 07/03/2017   Procedure: CARPAL TUNNEL RELEASE;  Surgeon: Hessie Knows, MD;  Location: [facility] ORS;  Service: Orthopedics;  Laterality: Right;  . clavide surgery  2005  . KNEE  SURGERY Right 2013  . SHOULDER SURGERY Right 2008    SOCIAL HISTORY: Social History   Tobacco Use  . Smoking status: Never Smoker  . Smokeless tobacco: Never Used  Substance Use Topics  . Alcohol use: Yes    Alcohol/week: 0.0 oz    Comment: RARE  . Drug use: No    FAMILY HISTORY: Family History  Problem Relation Age of Onset  . Cancer Mother        colon/rectal  . Hyperlipidemia Mother   . Cancer Brother        hodgekins  . Cancer Maternal Grandfather        lung  . Stroke Maternal Grandfather   . Diabetes Father   . Hypertension Father   . Hyperlipidemia Father   . Heart disease Father   . Alcoholism Father      ROS: Review of Systems  Constitutional: Positive for weight loss.  Cardiovascular: Negative for chest pain.  Gastrointestinal: Negative for nausea and vomiting.  Musculoskeletal:       Negative muscle weakness  Endo/Heme/Allergies:       Positive polyphagia Negative hypoglycemia    PHYSICAL EXAM: Blood pressure 117/76, pulse 77, temperature 98.1 F (36.7 C), temperature source Oral, height 6\' 1"  (1.854 m), weight 248 lb (112.5 kg), SpO2 100 %. Body mass index is 32.72 kg/m. Physical Exam  Constitutional: He is oriented to person, place, and time. He appears well-developed and well-nourished.  Cardiovascular: Normal rate.  Pulmonary/Chest: Effort normal.  Musculoskeletal: Normal range of motion.  Neurological: He is oriented to person, place, and time.  Skin: Skin is warm and dry.  Psychiatric: He has a normal mood and affect. His behavior is normal.  Vitals reviewed.   RECENT LABS AND TESTS: BMET    Component Value Date/Time   NA 138 01/28/2018 1007   NA 138 05/20/2013 0815   K 4.2 01/28/2018 1007   K 3.7 05/20/2013 0815   CL 101 01/28/2018 1007   CL 106 05/20/2013 0815   CO2 21 01/28/2018 1007   CO2 26 05/20/2013 0815   GLUCOSE 105 (H) 01/28/2018 1007   GLUCOSE 101 (H) 05/20/2013 0815   BUN 18 01/28/2018 1007   BUN 20 (H) 05/20/2013 0815   CREATININE 0.97 01/28/2018 1007   CREATININE 0.99 05/20/2013 0815   CALCIUM 9.5 01/28/2018 1007   CALCIUM 8.9 05/20/2013 0815   GFRNONAA 95 01/28/2018 1007   GFRNONAA >60 05/20/2013 0815   GFRAA 109 01/28/2018 1007   GFRAA >60 05/20/2013 0815   Lab Results  Component Value Date   HGBA1C 6.2 (H) 01/28/2018   HGBA1C 6.7 11/07/2017   HGBA1C 6.3 05/08/2017   HGBA1C 6.5 11/06/2016   HGBA1C 10.6 (H) 11/06/2011   Lab Results  Component Value Date   INSULIN 10.5 01/28/2018   CBC    Component Value Date/Time   WBC 6.4 01/28/2018 1007   WBC 9.8 11/05/2011 1606   RBC 5.16 01/28/2018 1007   RBC 5.61 11/05/2011 1606    HGB 14.6 01/28/2018 1007   HCT 43.5 01/28/2018 1007   PLT 250 06/23/2017 1104   MCV 84 01/28/2018 1007   MCV 84 11/05/2011 1606   MCH 28.3 01/28/2018 1007   MCH 29.1 11/05/2011 1606   MCHC 33.6 01/28/2018 1007   MCHC 34.6 11/05/2011 1606   RDW 14.3 01/28/2018 1007   RDW 13.0 11/05/2011 1606   LYMPHSABS 1.5 01/28/2018 1007   LYMPHSABS 1.8 11/05/2011 1606   MONOABS 0.6 11/05/2011 1606   EOSABS  0.2 01/28/2018 1007   EOSABS 0.1 11/05/2011 1606   BASOSABS 0.0 01/28/2018 1007   BASOSABS 0.0 11/05/2011 1606   Iron/TIBC/Ferritin/ %Sat No results found for: IRON, TIBC, FERRITIN, IRONPCTSAT Lipid Panel     Component Value Date/Time   CHOL 185 01/28/2018 1007   TRIG 109 01/28/2018 1007   HDL 63 01/28/2018 1007   CHOLHDL 3.5 06/23/2017 1104   LDLCALC 100 (H) 01/28/2018 1007   Hepatic Function Panel     Component Value Date/Time   PROT 7.0 01/28/2018 1007   PROT 8.5 (H) 11/05/2011 1606   ALBUMIN 4.4 01/28/2018 1007   ALBUMIN 4.5 11/05/2011 1606   AST 20 01/28/2018 1007   AST 36 11/05/2011 1606   ALT 20 01/28/2018 1007   ALT 53 11/05/2011 1606   ALKPHOS 70 01/28/2018 1007   ALKPHOS 147 (H) 11/05/2011 1606   BILITOT 0.3 01/28/2018 1007   BILITOT 0.5 11/05/2011 1606      Component Value Date/Time   TSH 1.330 01/28/2018 1007   TSH 1.440 01/04/2016 0818  Results for Cadotte, Jon C "NED" (MRN [id number]) as of 02/17/2018 16:25  Ref. Range 01/28/2018 10:07  Vitamin D, 25-Hydroxy Latest Ref Range: 30.0 - 100.0 ng/mL 27.0 (L)    ASSESSMENT AND PLAN: Vitamin D deficiency  Type 2 diabetes mellitus without complication, without long-term current use of insulin (HCC)  At risk for heart disease  Class 1 obesity with serious comorbidity and body mass index (BMI) of 32.0 to 32.9 in adult, unspecified obesity type  PLAN:  Vitamin D Deficiency [name] was informed that low vitamin D levels contributes to fatigue and are associated with obesity, breast, and colon cancer. [name]  agrees to start prescription Vit D @50 ,000 IU every week #4 with no refills. He will follow up for routine testing of vitamin D, at least 2-3 times per year. He was informed of the risk of over-replacement of vitamin D and agrees to not increase his dose unless he discusses this with [name] first. We will recheck labs in 3 months and [name] agrees to follow up with our clinic in 2 to 3 weeks.  Diabetes II [name] has been given extensive diabetes education by myself today including ideal fasting and post-prandial blood glucose readings, individual ideal Hgb A1c goals and hypoglycemia prevention. We discussed the importance of good blood sugar control to decrease the likelihood of diabetic complications such as nephropathy, neuropathy, limb loss, blindness, coronary artery disease, and death. We discussed the importance of intensive lifestyle modification including diet, exercise and weight loss as the first line treatment for diabetes. [name] agrees to continue his diabetes medications and diet, we will recheck labs in 3 months and [name] agrees to follow up with our clinic in 2 to 3 weeks.  Cardiovascular risk counselling [name] was given extended (30 minutes) coronary artery disease prevention counseling today. He is 42 y.o. male and has risk factors for heart disease including obesity and diabetes II. We discussed intensive lifestyle modifications today with an emphasis on specific weight loss instructions and strategies. Pt was also informed of the importance of increasing exercise and decreasing saturated fats to help prevent heart disease.  Obesity [name] is currently in the action stage of change. As such, his goal is to continue with weight loss efforts He has agreed to follow the Category 4 plan [name] has been instructed to work up to a goal of 150 minutes of combined cardio and strengthening exercise per week for weight loss and overall health benefits. We  discussed the following Behavioral  Modification Strategies today: increasing lean protein intake, decreasing simple carbohydrates  and work on meal planning and easy cooking plans   Dainel has agreed to follow up with our clinic in 2 to 3 weeks. He was informed of the importance of frequent follow up visits to maximize his success with intensive lifestyle modifications for his multiple health conditions.   OBESITY BEHAVIORAL INTERVENTION VISIT  Today's visit was # 2 out of 22.  Starting weight: 249 lbs Starting date: 01/28/18 Today's weight : 248 lbs  Today's date: 02/17/2018 Total lbs lost to date: 1 (Patients must lose 7 lbs in the first 6 months to continue with counseling)   ASK: We discussed the diagnosis of obesity with [facility] today and [facility] agreed to give [name] permission to discuss obesity behavioral modification therapy today.  ASSESS: Moriah has the diagnosis of obesity and his BMI today is 32.73 Korver is in the action stage of change   ADVISE: Adhvik was educated on the multiple health risks of obesity as well as the benefit of weight loss to improve his health. He was advised of the need for long term treatment and the importance of lifestyle modifications.  AGREE: Multiple dietary modification options and treatment options were discussed and  Alexi agreed to the above obesity treatment plan.  I, Trixie Dredge, am acting as transcriptionist for Dennard Nip, MD  I have reviewed the above documentation for accuracy and completeness, and I agree with the above. -Dennard Nip, MD

## 2018-02-18 ENCOUNTER — Ambulatory Visit: Payer: 59 | Admitting: Physical Therapy

## 2018-02-18 ENCOUNTER — Encounter (INDEPENDENT_AMBULATORY_CARE_PROVIDER_SITE_OTHER): Payer: Self-pay | Admitting: Family Medicine

## 2018-02-18 ENCOUNTER — Encounter: Payer: Self-pay | Admitting: Physical Therapy

## 2018-02-18 DIAGNOSIS — M6281 Muscle weakness (generalized): Secondary | ICD-10-CM

## 2018-02-18 DIAGNOSIS — M25572 Pain in left ankle and joints of left foot: Secondary | ICD-10-CM | POA: Diagnosis not present

## 2018-02-18 DIAGNOSIS — R262 Difficulty in walking, not elsewhere classified: Secondary | ICD-10-CM

## 2018-02-18 DIAGNOSIS — M62838 Other muscle spasm: Secondary | ICD-10-CM | POA: Diagnosis not present

## 2018-02-18 MED ORDER — VITAMIN D (ERGOCALCIFEROL) 1.25 MG (50000 UNIT) PO CAPS: 50000.0000 [IU] | ORAL_CAPSULE | ORAL | 0 refills | Status: DC

## 2018-02-18 NOTE — Therapy (Signed)
[facility] [address], [place], [address] Phone: [phone]   Fax:  [phone]  Physical Therapy Treatment  Patient Details  Name: [name] MRN: [id number] Date of Birth: [birth year] Referring Provider: Samara Deist DPM   Encounter Date: 02/18/2018  PT End of Session - 02/18/18 0956    Visit Number  6    Number of Visits  12    Date for PT Re-Evaluation  03/17/18    PT Start Time  0850    PT Stop Time  0935    PT Time Calculation (min)  45 min    Activity Tolerance  Patient tolerated treatment well    Behavior During Therapy  [facility] for tasks assessed/performed       Past Medical History:  Diagnosis Date  . Chest pain   . Depression   . Diabetes ([place])   . Family history of adverse reaction to anesthesia    MOM-N/V, DAD-HAS PROBLEMS WITH NOVICAINE  . Food allergy   . GERD (gastroesophageal reflux disease)   . History of hiatal hernia   . History of kidney stones    H/O  . Hypertension   . Insomnia   . Joint pain   . Plantar fasciitis   . Seasonal allergies   . Tendonitis of foot    left    Past Surgical History:  Procedure Laterality Date  . CARPAL TUNNEL RELEASE Right 07/03/2017   Procedure: CARPAL TUNNEL RELEASE;  Surgeon: Hessie Knows, MD;  Location: [facility] ORS;  Service: Orthopedics;  Laterality: Right;  . clavide surgery  2005  . KNEE SURGERY Right 2013  . SHOULDER SURGERY Right 2008    There were no vitals filed for this visit.  Subjective Assessment - 02/18/18 0855    Subjective  Patient reports he is doing well and feels at least 70% better with less pain in left heel/foot overall and able to wake up and put feet on floor without pain now. He continues with tight feeling in heel and his pain is aggravated with prolonged walking without boot.     Pertinent History  long history of pain in left heel beginning about 4-5 years ago with episodes of exacerbation of symptoms  each year. He has had physical therapy in the past with good results. He has a bone spur on his heel that is contributing to his chronic condition.     Limitations  Standing;Walking    How long can you walk comfortably?  <10 min without boot; able to walk as long as he wants when in the boot    Diagnostic tests  X ray: per MD notes: lateral calcaneal axial of the left heel shows the posterior exostosis. comparison to 2016 shows no major changes    Patient Stated Goals  resume normal activity with walking, working without boot    Currently in Pain?  No/denies       Observation:  Observation: left heel with no significant swelling as compared to right LE Palpation: spasms with decreased soft tissue elasticity along lateral calf and evertors left LE, improved from previous session   Treatment: Manual therapy: 10 min goal: improved soft tissue elasticity, pain  STM performed to left calf lateral aspect > medial aspect, superficial and deep techniques; patient prone lying    Modalities:  Ultrasound: pulsed 50% 1MHz 1.3w/cm2 x 10 min to left heel/Achilles tendon with patient prone with LE supported on pilllow for pain, spasms  Iontophoresis with dexamethasone 4mg /ml @ 47ma*min; (34min to apply) small electrode to posterior aspect of heel left LE with patient prone lying; no adverse reactions noted   Electrical stimulation: 15 min: Russian stim. 10/10 cycle applied (2) electrodes to left calf for muscle re education with patient prone with left LE supported on pillow; goal muscle re education   Patient response to treatment:  improved soft tissue elasticity up to 50% following STM and no adverse reactions noted with iontophoresis. full contraction achieved with estim.         PT Education - 02/18/18 0955    Education provided  Yes    Education Details  re assessed pain control strategies including putting footwear on first thing in the morning to avoid pain     Person(s) Educated  Patient     Methods  Explanation    Comprehension  Verbalized understanding          PT Long Term Goals - 02/03/18 1135      PT LONG TERM GOAL #1   Title  Patient will demonstrate improved function with daily tasks with less pain and impairment as indicated by FADI (foot/ankle disability index) of 40% or less     Baseline  FADI 52%    Status  New    Target Date  02/24/18      PT LONG TERM GOAL #2   Title  Patient will demonstrate improved function with daily tasks with less pain and impairment as indicated by FADI (foot/ankle disability index) of 25% or less     Baseline  FADI 52%    Status  New    Target Date  03/17/18      PT LONG TERM GOAL #3   Title  Patient will be independent in HEP for exercise and self management of pain and be able to walk without CAM walker boot with <2/10 left heel demonstrating improved function with daily tasks at home/work    Baseline  limited knowledge of pain control strategies and self managment of symptoms, exercises; ambulating with CAM walker boot    Status  New    Target Date  03/17/18            Plan - 02/18/18 0957    Clinical Impression Statement  Patient continues steady progress with goals. He is having less intensity of symptoms in left heel and is able to walk without boot for short periods of time now. He is responding well to current physical therapy interveniotn and should continue imrpovement with additional treatment.     Rehab Potential  Fair    Clinical Impairments Affecting Rehab Potential  chronicity of issue, pain, bone spur identified, hx of DM    PT Frequency  2x / week    PT Duration  6 weeks    PT Treatment/Interventions  ADLs/Self Care Home Management;Cryotherapy;Iontophoresis 4mg /ml Dexamethasone;Therapeutic exercise;Balance training;Neuromuscular re-education;Patient/family education;Manual techniques;Passive range of motion;Dry needling;Taping;Electrical Stimulation;Ultrasound    PT Next Visit Plan  manual techniquec  iontophoresis with Dexamethasone, isometric exercise; assess gait without boot    PT Home Exercise Plan  pain control, STM left calf, gentle sretching left calf       Patient will benefit from skilled therapeutic intervention in order to improve the following deficits and impairments:  Abnormal gait, Increased fascial restricitons, Pain, Increased muscle spasms, Decreased activity tolerance, Decreased strength, Impaired perceived functional ability, Difficulty walking  Visit Diagnosis: Pain in left ankle and joints of left foot  Muscle weakness (generalized)  Other  muscle spasm  Difficulty in walking, not elsewhere classified     Problem List Patient Active Problem List   Diagnosis Date Noted  . Other fatigue 01/28/2018  . Shortness of breath on exertion 01/28/2018  . Type 2 diabetes mellitus without complication, without long-term current use of insulin ([place]) 01/28/2018  . Seasonal affective disorder ([place]) 12/23/2017  . Seasonal allergic rhinitis 06/24/2017  . Hyperlipidemia associated with type 2 diabetes mellitus ([place]) 03/12/2017  . Osteoarthritis of multiple joints 10/08/2016  . Pain of right thumb 05/10/2016  . Plantar fasciitis 02/25/2016  . Achilles tendinitis 02/25/2016  . Type 2 diabetes mellitus with other specified complication ([place]) 29/93/7169  . GERD (gastroesophageal reflux disease) 01/02/2016  . Essential hypertension 06/27/2015  . Depression 06/27/2015    Jomarie Longs PT 02/18/2018, 10:06 AM  [facility] [address], [place], [address] Phone: [phone]   Fax:  [phone]  Name: [name] MRN: [id number] Date of Birth: Jan 26, 1976

## 2018-02-19 ENCOUNTER — Encounter: Payer: 59 | Admitting: Physical Therapy

## 2018-02-23 ENCOUNTER — Other Ambulatory Visit: Payer: Self-pay | Admitting: Family Medicine

## 2018-02-23 DIAGNOSIS — M1990 Unspecified osteoarthritis, unspecified site: Secondary | ICD-10-CM

## 2018-02-24 ENCOUNTER — Encounter: Payer: Self-pay | Admitting: Physical Therapy

## 2018-02-24 ENCOUNTER — Ambulatory Visit: Payer: 59 | Admitting: Physical Therapy

## 2018-02-24 DIAGNOSIS — M6281 Muscle weakness (generalized): Secondary | ICD-10-CM

## 2018-02-24 DIAGNOSIS — R262 Difficulty in walking, not elsewhere classified: Secondary | ICD-10-CM

## 2018-02-24 DIAGNOSIS — M25572 Pain in left ankle and joints of left foot: Secondary | ICD-10-CM | POA: Diagnosis not present

## 2018-02-24 DIAGNOSIS — M62838 Other muscle spasm: Secondary | ICD-10-CM

## 2018-02-24 NOTE — Therapy (Signed)
[facility] [address], [place], [address] Phone: [phone]   Fax:  [phone]  Physical Therapy Treatment  Patient Details  Name: [name] MRN: [id number] Date of Birth: Nov 13, 1975 Referring Provider: Samara Deist DPM   Encounter Date: 02/24/2018  PT End of Session - 02/24/18 0735    Visit Number  7    Number of Visits  12    Date for PT Re-Evaluation  03/17/18    PT Start Time  0731    PT Stop Time  0818    PT Time Calculation (min)  47 min    Activity Tolerance  Patient tolerated treatment well    Behavior During Therapy  [facility] for tasks assessed/performed       Past Medical History:  Diagnosis Date  . Chest pain   . Depression   . Diabetes ([place])   . Family history of adverse reaction to anesthesia    MOM-N/V, DAD-HAS PROBLEMS WITH NOVICAINE  . Food allergy   . GERD (gastroesophageal reflux disease)   . History of hiatal hernia   . History of kidney stones    H/O  . Hypertension   . Insomnia   . Joint pain   . Plantar fasciitis   . Seasonal allergies   . Tendonitis of foot    left    Past Surgical History:  Procedure Laterality Date  . CARPAL TUNNEL RELEASE Right 07/03/2017   Procedure: CARPAL TUNNEL RELEASE;  Surgeon: Hessie Knows, MD;  Location: [facility] ORS;  Service: Orthopedics;  Laterality: Right;  . clavide surgery  2005  . KNEE SURGERY Right 2013  . SHOULDER SURGERY Right 2008    There were no vitals filed for this visit.  Subjective Assessment - 02/24/18 0733    Subjective  Patient reports he continues to do well with left heel with decreasing symptoms and is walking wihtout boot with mild irritaion; he knows when he's done too much.     Pertinent History  long history of pain in left heel beginning about 4-5 years ago with episodes of exacerbation of symptoms each year. He has had physical therapy in the past with good results. He has a bone spur on his heel that  is contributing to his chronic condition.     Limitations  Standing;Walking    How long can you walk comfortably?  <10 min without boot; able to walk as long as he wants when in the boot    Diagnostic tests  X ray: per MD notes: lateral calcaneal axial of the left heel shows the posterior exostosis. comparison to 2016 shows no major changes    Patient Stated Goals  resume normal activity with walking, working without boot    Currently in Pain?  No/denies          Observation:  Observation: left heel with no significant swelling as compared to right LE Palpation: spasms with decreased soft tissue elasticity along lateral calf and evertors left LE an Achilles tendon    Treatment: Manual therapy: 10 min goal: improved soft tissue elasticity, pain  STM performed to left calf lateral aspect > medial aspect, superficial and deep techniques; patient prone lying    Modalities:  Ultrasound: pulsed 50% 1MHz 1.3w/cm2 x 10 min to left heel/Achilles tendon with patient prone with LE supported on pilllow for pain, spasms   Iontophoresis with dexamethasone 4mg /ml @ 31ma*min; (6 min to apply) small electrode to posterior aspect of heel left  LE with patient prone lying; no adverse reactions noted   Electrical stimulation: 15 min: Russian stim. 10/10 cycle applied (2) electrodes to left calf for muscle re education with patient prone with left LE supported on pillow; goal muscle re education   Patient response to treatment:  improved soft tissue elasticity up to 50% following STM and no adverse reactions noted with iontophoresis. full contraction achieved with estim.         PT Education - 02/24/18 0810    Education provided  Yes    Education Details  discussed resting left LE and using boot as needed when over working and irritating left heel     Person(s) Educated  Patient    Methods  Explanation    Comprehension  Verbalized understanding          PT Long Term Goals - 02/03/18 1135       PT LONG TERM GOAL #1   Title  Patient will demonstrate improved function with daily tasks with less pain and impairment as indicated by FADI (foot/ankle disability index) of 40% or less     Baseline  FADI 52%    Status  New    Target Date  02/24/18      PT LONG TERM GOAL #2   Title  Patient will demonstrate improved function with daily tasks with less pain and impairment as indicated by FADI (foot/ankle disability index) of 25% or less     Baseline  FADI 52%    Status  New    Target Date  03/17/18      PT LONG TERM GOAL #3   Title  Patient will be independent in HEP for exercise and self management of pain and be able to walk without CAM walker boot with <2/10 left heel demonstrating improved function with daily tasks at home/work    Baseline  limited knowledge of pain control strategies and self managment of symptoms, exercises; ambulating with CAM walker boot    Status  New    Target Date  03/17/18            Plan - 02/24/18 1036    Clinical Impression Statement  Patient is progressing well with goals with decreasing symptoms in left ankle and able to walk and stand with less difficulty. He continues with exacerbation of symptoms if he does too much activity without boot in place. He is responding well to current treatment.     Rehab Potential  Fair    Clinical Impairments Affecting Rehab Potential  chronicity of issue, pain, bone spur identified, hx of DM    PT Frequency  2x / week    PT Duration  6 weeks    PT Treatment/Interventions  ADLs/Self Care Home Management;Cryotherapy;Iontophoresis 4mg /ml Dexamethasone;Therapeutic exercise;Balance training;Neuromuscular re-education;Patient/family education;Manual techniques;Passive range of motion;Dry needling;Taping;Electrical Stimulation;Ultrasound    PT Next Visit Plan  manual techniquec iontophoresis with Dexamethasone, isometric exercise; assess gait without boot    PT Home Exercise Plan  pain control, STM left calf, gentle  sretching left calf       Patient will benefit from skilled therapeutic intervention in order to improve the following deficits and impairments:  Abnormal gait, Increased fascial restricitons, Pain, Increased muscle spasms, Decreased activity tolerance, Decreased strength, Impaired perceived functional ability, Difficulty walking  Visit Diagnosis: Pain in left ankle and joints of left foot  Muscle weakness (generalized)  Other muscle spasm  Difficulty in walking, not elsewhere classified     Problem List Patient Active Problem List  Diagnosis Date Noted  . Other fatigue 01/28/2018  . Shortness of breath on exertion 01/28/2018  . Type 2 diabetes mellitus without complication, without long-term current use of insulin ([place]) 01/28/2018  . Seasonal affective disorder ([place]) 12/23/2017  . Seasonal allergic rhinitis 06/24/2017  . Hyperlipidemia associated with type 2 diabetes mellitus ([place]) 03/12/2017  . Osteoarthritis of multiple joints 10/08/2016  . Pain of right thumb 05/10/2016  . Plantar fasciitis 02/25/2016  . Achilles tendinitis 02/25/2016  . Type 2 diabetes mellitus with other specified complication ([place]) 37/06/6268  . GERD (gastroesophageal reflux disease) 01/02/2016  . Essential hypertension 06/27/2015  . Depression 06/27/2015    Jomarie Longs PT 02/24/2018, 6:46 PM  [facility] [address], [place], [address] Phone: [phone]   Fax:  [phone]  Name: [name] MRN: [id number] Date of Birth: 30-Dec-1975

## 2018-02-26 ENCOUNTER — Encounter: Payer: Self-pay | Admitting: Physical Therapy

## 2018-02-26 ENCOUNTER — Ambulatory Visit: Payer: 59 | Admitting: Physical Therapy

## 2018-02-26 DIAGNOSIS — M62838 Other muscle spasm: Secondary | ICD-10-CM

## 2018-02-26 DIAGNOSIS — M6281 Muscle weakness (generalized): Secondary | ICD-10-CM

## 2018-02-26 DIAGNOSIS — R262 Difficulty in walking, not elsewhere classified: Secondary | ICD-10-CM

## 2018-02-26 DIAGNOSIS — M25572 Pain in left ankle and joints of left foot: Secondary | ICD-10-CM

## 2018-02-26 NOTE — Therapy (Signed)
[facility] [address] [place], [address] Phone: [phone]   Fax:  [phone]  Physical Therapy Treatment  Patient Details  Name: [name] MRN: [id number] Date of Birth: Mar 27, 1976 Referring Provider: Samara Deist DPM   Encounter Date: 02/26/2018  PT End of Session - 02/26/18 0826    Visit Number  8    Number of Visits  12    Date for PT Re-Evaluation  03/17/18    PT Start Time  0733    PT Stop Time  0818    PT Time Calculation (min)  45 min    Activity Tolerance  Patient tolerated treatment well    Behavior During Therapy  [facility] for tasks assessed/performed       Past Medical History:  Diagnosis Date  . Chest pain   . Depression   . Diabetes ([place])   . Family history of adverse reaction to anesthesia    MOM-N/V, DAD-HAS PROBLEMS WITH NOVICAINE  . Food allergy   . GERD (gastroesophageal reflux disease)   . History of hiatal hernia   . History of kidney stones    H/O  . Hypertension   . Insomnia   . Joint pain   . Plantar fasciitis   . Seasonal allergies   . Tendonitis of foot    left    Past Surgical History:  Procedure Laterality Date  . CARPAL TUNNEL RELEASE Right 07/03/2017   Procedure: CARPAL TUNNEL RELEASE;  Surgeon: Hessie Knows, MD;  Location: [facility] ORS;  Service: Orthopedics;  Laterality: Right;  . clavide surgery  2005  . KNEE SURGERY Right 2013  . SHOULDER SURGERY Right 2008    There were no vitals filed for this visit.  Subjective Assessment - 02/26/18 0824    Subjective  Patient reports he notices increased tenderness and irritation in left heel when not taking Lodine. He is learning to manage his pain with rest and modified activity and HEP.     Pertinent History  long history of pain in left heel beginning about 4-5 years ago with episodes of exacerbation of symptoms each year. He has had physical therapy in the past with good results. He has a bone spur on his  heel that is contributing to his chronic condition.     Limitations  Standing;Walking    How long can you walk comfortably?  <10 min without boot; able to walk as long as he wants when in the boot    Diagnostic tests  X ray: per MD notes: lateral calcaneal axial of the left heel shows the posterior exostosis. comparison to 2016 shows no major changes    Patient Stated Goals  resume normal activity with walking, working without boot    Currently in Pain?  Yes    Pain Score  2     Pain Location  Heel    Pain Orientation  Left    Pain Descriptors / Indicators  Tender    Pain Type  Chronic pain    Pain Onset  Yesterday    Pain Frequency  Intermittent       Observation: Observation: left heel withno significantswelling as compared to right LE Palpation: mild spasms along left calf muscle proximal attachment  Treatment: Manual therapy: 10 min goal: improved soft tissue elasticity, pain  STM performed to left calf lateral aspect andmedial aspect, superficial and deep techniques; patient prone lying   Modalities:  Ultrasound: pulsed 50% 1MHz 1.3w/cm2 x 10  min to left heel/Achilles tendon with patient prone with LE supported on pilllow for pain, spasms  Iontophoresis with dexamethasone 4mg /ml @ 69ma*min; (6 min to apply) small electrode to posterior aspect of heel left LE with patient prone lying; no adverse reactions noted  Electrical stimulation:15 [id number] stim. 10/10 cycle applied (2) electrodes toleft calf for muscle re education with patient prone with left LE supportedon pillow; goal muscle re education  Patient response to treatment:improved soft tissue elasticity 30% and decreased tenderness to very mild in left heel with walking at end of session. No adverse reactions to modalities noted.    PT Education - 02/26/18 0825    Education provided  Yes    Education Details  HEP to continue, modifying activity     Person(s) Educated  Patient    Methods  Explanation     Comprehension  Verbalized understanding          PT Long Term Goals - 02/03/18 1135      PT LONG TERM GOAL #1   Title  Patient will demonstrate improved function with daily tasks with less pain and impairment as indicated by FADI (foot/ankle disability index) of 40% or less     Baseline  FADI 52%    Status  New    Target Date  02/24/18      PT LONG TERM GOAL #2   Title  Patient will demonstrate improved function with daily tasks with less pain and impairment as indicated by FADI (foot/ankle disability index) of 25% or less     Baseline  FADI 52%    Status  New    Target Date  03/17/18      PT LONG TERM GOAL #3   Title  Patient will be independent in HEP for exercise and self management of pain and be able to walk without CAM walker boot with <2/10 left heel demonstrating improved function with daily tasks at home/work    Baseline  limited knowledge of pain control strategies and self managment of symptoms, exercises; ambulating with CAM walker boot    Status  New    Target Date  03/17/18            Plan - 02/26/18 0831    Clinical Impression Statement  Patient is progressing well towards goals and responding well to current treatment. He will continue to benefit from continued physical therapy intervention to achieve lasting results.     Rehab Potential  Fair    Clinical Impairments Affecting Rehab Potential  chronicity of issue, pain, bone spur identified, hx of DM    PT Frequency  2x / week    PT Duration  6 weeks    PT Treatment/Interventions  ADLs/Self Care Home Management;Cryotherapy;Iontophoresis 4mg /ml Dexamethasone;Therapeutic exercise;Balance training;Neuromuscular re-education;Patient/family education;Manual techniques;Passive range of motion;Dry needling;Taping;Electrical Stimulation;Ultrasound    PT Next Visit Plan  manual techniquec iontophoresis with Dexamethasone, isometric exercise; assess gait without boot    PT Home Exercise Plan  pain control, STM left  calf, gentle sretching left calf       Patient will benefit from skilled therapeutic intervention in order to improve the following deficits and impairments:  Abnormal gait, Increased fascial restricitons, Pain, Increased muscle spasms, Decreased activity tolerance, Decreased strength, Impaired perceived functional ability, Difficulty walking  Visit Diagnosis: Pain in left ankle and joints of left foot  Muscle weakness (generalized)  Other muscle spasm  Difficulty in walking, not elsewhere classified     Problem List Patient Active Problem List  Diagnosis Date Noted  . Other fatigue 01/28/2018  . Shortness of breath on exertion 01/28/2018  . Type 2 diabetes mellitus without complication, without long-term current use of insulin ([place]) 01/28/2018  . Seasonal affective disorder ([place]) 12/23/2017  . Seasonal allergic rhinitis 06/24/2017  . Hyperlipidemia associated with type 2 diabetes mellitus ([place]) 03/12/2017  . Osteoarthritis of multiple joints 10/08/2016  . Pain of right thumb 05/10/2016  . Plantar fasciitis 02/25/2016  . Achilles tendinitis 02/25/2016  . Type 2 diabetes mellitus with other specified complication ([place]) 14/43/1540  . GERD (gastroesophageal reflux disease) 01/02/2016  . Essential hypertension 06/27/2015  . Depression 06/27/2015    Jomarie Longs PT 02/26/2018, 8:33 AM  [facility] [address] [place], [address] Phone: [phone]   Fax:  [phone]  Name: BENTLEE BENNINGFIELD MRN: [id number] Date of Birth: [birth year]

## 2018-03-03 ENCOUNTER — Ambulatory Visit: Payer: 59 | Admitting: Physical Therapy

## 2018-03-03 ENCOUNTER — Encounter: Payer: Self-pay | Admitting: Physical Therapy

## 2018-03-03 DIAGNOSIS — G8929 Other chronic pain: Secondary | ICD-10-CM | POA: Diagnosis not present

## 2018-03-03 DIAGNOSIS — M6281 Muscle weakness (generalized): Secondary | ICD-10-CM | POA: Diagnosis not present

## 2018-03-03 DIAGNOSIS — M62838 Other muscle spasm: Secondary | ICD-10-CM | POA: Diagnosis not present

## 2018-03-03 DIAGNOSIS — R262 Difficulty in walking, not elsewhere classified: Secondary | ICD-10-CM | POA: Diagnosis not present

## 2018-03-03 DIAGNOSIS — M79672 Pain in left foot: Secondary | ICD-10-CM | POA: Diagnosis not present

## 2018-03-03 DIAGNOSIS — M25572 Pain in left ankle and joints of left foot: Secondary | ICD-10-CM | POA: Diagnosis not present

## 2018-03-03 DIAGNOSIS — M7662 Achilles tendinitis, left leg: Secondary | ICD-10-CM | POA: Diagnosis not present

## 2018-03-03 NOTE — Therapy (Signed)
[facility] [address] [place], [address] Phone: [phone]   Fax:  [phone]  Physical Therapy Treatment  Patient Details  Name: [name] MRN: [id number] Date of Birth: 11-22-1975 Referring Provider: Samara Deist DPM   Encounter Date: 03/03/2018  PT End of Session - 03/03/18 0816    Visit Number  9    Number of Visits  12    Date for PT Re-Evaluation  03/17/18    PT Start Time  0730    PT Stop Time  0822    PT Time Calculation (min)  52 min    Activity Tolerance  Patient tolerated treatment well    Behavior During Therapy  [facility] for tasks assessed/performed       Past Medical History:  Diagnosis Date  . Chest pain   . Depression   . Diabetes ([place])   . Family history of adverse reaction to anesthesia    MOM-N/V, DAD-HAS PROBLEMS WITH NOVICAINE  . Food allergy   . GERD (gastroesophageal reflux disease)   . History of hiatal hernia   . History of kidney stones    H/O  . Hypertension   . Insomnia   . Joint pain   . Plantar fasciitis   . Seasonal allergies   . Tendonitis of foot    left    Past Surgical History:  Procedure Laterality Date  . CARPAL TUNNEL RELEASE Right 07/03/2017   Procedure: CARPAL TUNNEL RELEASE;  Surgeon: Hessie Knows, MD;  Location: [facility] ORS;  Service: Orthopedics;  Laterality: Right;  . clavide surgery  2005  . KNEE SURGERY Right 2013  . SHOULDER SURGERY Right 2008    There were no vitals filed for this visit.  Subjective Assessment - 03/03/18 0814    Subjective  Patient reports he has an average of 3/10 pain with walking without boot and is able to control pain and swelling with ice application following aggravating activities.     Pertinent History  long history of pain in left heel beginning about 4-5 years ago with episodes of exacerbation of symptoms each year. He has had physical therapy in the past with good results. He has a bone spur on his heel  that is contributing to his chronic condition.     Limitations  Standing;Walking    How long can you walk comfortably?  <10 min without boot; able to walk as long as he wants when in the boot    Diagnostic tests  X ray: per MD notes: lateral calcaneal axial of the left heel shows the posterior exostosis. comparison to 2016 shows no major changes    Patient Stated Goals  resume normal activity with walking, working without boot    Currently in Pain?  Yes    Pain Score  2     Pain Location  Heel    Pain Orientation  Left    Pain Descriptors / Indicators  Sore    Pain Type  Chronic pain    Pain Onset  More than a month ago    Pain Frequency  Intermittent           Observation:  Observation: left heel with no significant swelling as compared to right LE Palpation: mild spasms along left calf muscle proximal attachment   Treatment: Manual therapy: 10 min goal: improved soft tissue elasticity, pain  STM performed to left calf lateral aspect and medial aspect, superficial and deep techniques; patient prone lying  Modalities:  Ultrasound: pulsed 50% 1MHz 1.3w/cm2 x 10 min to left heel/Achilles tendon with patient prone with LE supported on pilllow for pain, spasms   Iontophoresis with dexamethasone 4mg /ml @ 75ma*min; (6 min to apply) small electrode to posterior aspect of heel left LE with patient prone lying; no adverse reactions noted   Electrical stimulation: 15 min: Russian stim. 10/10 cycle applied (2) electrodes to left calf for muscle re education with patient prone with left LE supported on pillow; goal muscle re education   Patient response to treatment: improved soft tissue elasticity 50% No adverse reactions to iontophoresis noted.          PT Education - 03/03/18 0815    Education provided  Yes    Education Details  HEP for gentle stretching of left calf    Person(s) Educated  Patient    Methods  Explanation    Comprehension  Verbalized understanding           PT Long Term Goals - 02/03/18 1135      PT LONG TERM GOAL #1   Title  Patient will demonstrate improved function with daily tasks with less pain and impairment as indicated by FADI (foot/ankle disability index) of 40% or less     Baseline  FADI 52%    Status  New    Target Date  02/24/18      PT LONG TERM GOAL #2   Title  Patient will demonstrate improved function with daily tasks with less pain and impairment as indicated by FADI (foot/ankle disability index) of 25% or less     Baseline  FADI 52%    Status  New    Target Date  03/17/18      PT LONG TERM GOAL #3   Title  Patient will be independent in HEP for exercise and self management of pain and be able to walk without CAM walker boot with <2/10 left heel demonstrating improved function with daily tasks at home/work    Baseline  limited knowledge of pain control strategies and self managment of symptoms, exercises; ambulating with CAM walker boot    Status  New    Target Date  03/17/18            Plan - 03/03/18 0817    Clinical Impression Statement  Patient continues steady progress with decreasing symptoms with increased activity and is learning to control symptoms with ice and rest. He continues to see benefit from current treatment and will benefit from additional physical therpay intervention to achieve maximal results.     Rehab Potential  Fair    Clinical Impairments Affecting Rehab Potential  chronicity of issue, pain, bone spur identified, hx of DM    PT Frequency  2x / week    PT Duration  6 weeks    PT Treatment/Interventions  ADLs/Self Care Home Management;Cryotherapy;Iontophoresis 4mg /ml Dexamethasone;Therapeutic exercise;Balance training;Neuromuscular re-education;Patient/family education;Manual techniques;Passive range of motion;Dry needling;Taping;Electrical Stimulation;Ultrasound    PT Next Visit Plan  manual techniquec iontophoresis with Dexamethasone, isometric exercise; assess gait without boot     PT Home Exercise Plan  pain control, STM left calf, gentle sretching left calf       Patient will benefit from skilled therapeutic intervention in order to improve the following deficits and impairments:  Abnormal gait, Increased fascial restricitons, Pain, Increased muscle spasms, Decreased activity tolerance, Decreased strength, Impaired perceived functional ability, Difficulty walking  Visit Diagnosis: Pain in left ankle and joints of left foot  Muscle weakness (generalized)  Other muscle spasm  Difficulty in walking, not elsewhere classified     Problem List Patient Active Problem List   Diagnosis Date Noted  . Other fatigue 01/28/2018  . Shortness of breath on exertion 01/28/2018  . Type 2 diabetes mellitus without complication, without long-term current use of insulin ([place]) 01/28/2018  . Seasonal affective disorder ([place]) 12/23/2017  . Seasonal allergic rhinitis 06/24/2017  . Hyperlipidemia associated with type 2 diabetes mellitus ([place]) 03/12/2017  . Osteoarthritis of multiple joints 10/08/2016  . Pain of right thumb 05/10/2016  . Plantar fasciitis 02/25/2016  . Achilles tendinitis 02/25/2016  . Type 2 diabetes mellitus with other specified complication ([place]) 24/58/0998  . GERD (gastroesophageal reflux disease) 01/02/2016  . Essential hypertension 06/27/2015  . Depression 06/27/2015    [name] PT 03/03/2018, 4:36 PM  [facility] [address] [place], [address] Phone: [phone]   Fax:  [phone]  Name: [name] MRN: [id number] Date of Birth: 15-Sep-1976

## 2018-03-05 ENCOUNTER — Ambulatory Visit: Payer: 59 | Admitting: Physical Therapy

## 2018-03-05 ENCOUNTER — Encounter: Payer: Self-pay | Admitting: Physical Therapy

## 2018-03-05 DIAGNOSIS — M25572 Pain in left ankle and joints of left foot: Secondary | ICD-10-CM

## 2018-03-05 DIAGNOSIS — M62838 Other muscle spasm: Secondary | ICD-10-CM | POA: Diagnosis not present

## 2018-03-05 DIAGNOSIS — M6281 Muscle weakness (generalized): Secondary | ICD-10-CM

## 2018-03-05 DIAGNOSIS — R262 Difficulty in walking, not elsewhere classified: Secondary | ICD-10-CM | POA: Diagnosis not present

## 2018-03-05 NOTE — Therapy (Signed)
[facility] [address], [place], [address] Phone: [phone]   Fax:  [phone]  Physical Therapy Treatment  Patient Details  Name: [name] MRN: [id number] Date of Birth: [birth year] Referring Provider: Samara Deist DPM   Encounter Date: 03/05/2018  PT End of Session - 03/05/18 0737    Visit Number  10    Number of Visits  12    Date for PT Re-Evaluation  03/17/18    PT Start Time  0733    PT Stop Time  0815    PT Time Calculation (min)  42 min    Activity Tolerance  Patient tolerated treatment well    Behavior During Therapy  [facility] for tasks assessed/performed       Past Medical History:  Diagnosis Date  . Chest pain   . Depression   . Diabetes ([place])   . Family history of adverse reaction to anesthesia    MOM-N/V, DAD-HAS PROBLEMS WITH NOVICAINE  . Food allergy   . GERD (gastroesophageal reflux disease)   . History of hiatal hernia   . History of kidney stones    H/O  . Hypertension   . Insomnia   . Joint pain   . Plantar fasciitis   . Seasonal allergies   . Tendonitis of foot    left    Past Surgical History:  Procedure Laterality Date  . CARPAL TUNNEL RELEASE Right 07/03/2017   Procedure: CARPAL TUNNEL RELEASE;  Surgeon: Hessie Knows, MD;  Location: [facility] ORS;  Service: Orthopedics;  Laterality: Right;  . clavide surgery  2005  . KNEE SURGERY Right 2013  . SHOULDER SURGERY Right 2008    There were no vitals filed for this visit.  Subjective Assessment - 03/05/18 0738    Subjective  Patient reports he is now out of boot and is doing well. He is learning to control symptoms with ice, rest if he feels increased discomfort.     Pertinent History  long history of pain in left heel beginning about 4-5 years ago with episodes of exacerbation of symptoms each year. He has had physical therapy in the past with good results. He has a bone spur on his heel that is contributing to his  chronic condition.     Limitations  Standing;Walking    How long can you walk comfortably?  <10 min without boot; able to walk as long as he wants when in the boot    Diagnostic tests  X ray: per MD notes: lateral calcaneal axial of the left heel shows the posterior exostosis. comparison to 2016 shows no major changes    Patient Stated Goals  resume normal activity with walking, working without boot    Currently in Pain?  Yes    Pain Score  2     Pain Location  Heel    Pain Orientation  Left    Pain Descriptors / Indicators  Sore    Pain Onset  More than a month ago    Pain Frequency  Intermittent          Observation:  Observation: left heel with no significant swelling as compared to right LE Palpation: mild spasms along left calf muscle proximal attachment, improved from previous session   Treatment: Manual therapy: 10 min goal: improved soft tissue elasticity, pain  STM performed to left calf lateral aspect and medial aspect, superficial and deep techniques; patient prone lying    Modalities:  Ultrasound:  pulsed 50% 1MHz 1.3w/cm2 x 10 min to left heel/Achilles tendon with patient prone with LE supported on pilllow for pain, spasms   Iontophoresis with dexamethasone 4mg /ml @ 47ma*min; (5 min to apply) small electrode to posterior aspect of heel left LE with patient prone lying; no adverse reactions noted   Electrical stimulation: 15 min: Russian stim. 10/10 cycle applied (2) electrodes to left calf for muscle re education with patient prone with left LE supported on pillow; goal muscle re education   Patient response to treatment: patient verbalized good understanding of home program for exercise and self management.  Patient with decreased pain from  2 /10 to <1/10. Patient with decreased spasms by 30% following STM. Full contraction achieved with russian stim to calf       PT Education - 03/05/18 0910    Education provided  Yes    Education Details  re assessed HEP to  continue for self management, resistive band exercises for ankle    Person(s) Educated  Patient    Methods  Explanation    Comprehension  Verbalized understanding          PT Long Term Goals - 02/03/18 1135      PT LONG TERM GOAL #1   Title  Patient will demonstrate improved function with daily tasks with less pain and impairment as indicated by FADI (foot/ankle disability index) of 40% or less     Baseline  FADI 52%    Status  New    Target Date  02/24/18      PT LONG TERM GOAL #2   Title  Patient will demonstrate improved function with daily tasks with less pain and impairment as indicated by FADI (foot/ankle disability index) of 25% or less     Baseline  FADI 52%    Status  New    Target Date  03/17/18      PT LONG TERM GOAL #3   Title  Patient will be independent in HEP for exercise and self management of pain and be able to walk without CAM walker boot with <2/10 left heel demonstrating improved function with daily tasks at home/work    Baseline  limited knowledge of pain control strategies and self managment of symptoms, exercises; ambulating with CAM walker boot    Status  New    Target Date  03/17/18            Plan - 03/05/18 0811    Clinical Impression Statement  Patient is progressing steaily with decreasing symptoms and pain and improving function with daily activities without exacerbation of symptoms. He is learning to control his symptoms when his symptoms worsen and he continues to respond well to current physical therapy interventions.     Rehab Potential  Fair    Clinical Impairments Affecting Rehab Potential  chronicity of issue, pain, bone spur identified, hx of DM    PT Frequency  2x / week    PT Duration  6 weeks    PT Treatment/Interventions  ADLs/Self Care Home Management;Cryotherapy;Iontophoresis 4mg /ml Dexamethasone;Therapeutic exercise;Balance training;Neuromuscular re-education;Patient/family education;Manual techniques;Passive range of motion;Dry  needling;Taping;Electrical Stimulation;Ultrasound    PT Next Visit Plan  manual techniquec iontophoresis with Dexamethasone, isometric exercise; assess gait without boot    PT Home Exercise Plan  pain control, STM left calf, gentle sretching left calf       Patient will benefit from skilled therapeutic intervention in order to improve the following deficits and impairments:  Abnormal gait, Increased fascial restricitons, Pain, Increased  muscle spasms, Decreased activity tolerance, Decreased strength, Impaired perceived functional ability, Difficulty walking  Visit Diagnosis: Pain in left ankle and joints of left foot  Muscle weakness (generalized)  Other muscle spasm  Difficulty in walking, not elsewhere classified     Problem List Patient Active Problem List   Diagnosis Date Noted  . Other fatigue 01/28/2018  . Shortness of breath on exertion 01/28/2018  . Type 2 diabetes mellitus without complication, without long-term current use of insulin ([place]) 01/28/2018  . Seasonal affective disorder ([employer]) 12/23/2017  . Seasonal allergic rhinitis 06/24/2017  . Hyperlipidemia associated with type 2 diabetes mellitus ([place]) 03/12/2017  . Osteoarthritis of multiple joints 10/08/2016  . Pain of right thumb 05/10/2016  . Plantar fasciitis 02/25/2016  . Achilles tendinitis 02/25/2016  . Type 2 diabetes mellitus with other specified complication ([place]) 56/70/1410  . GERD (gastroesophageal reflux disease) 01/02/2016  . Essential hypertension 06/27/2015  . Depression 06/27/2015    Jomarie Longs PT 03/05/2018, 9:11 AM  [facility] [address] [place], [address] Phone: [phone]   Fax:  [phone]  Name: [name] MRN: [id number] Date of Birth: March 26, 1976

## 2018-03-10 ENCOUNTER — Ambulatory Visit (INDEPENDENT_AMBULATORY_CARE_PROVIDER_SITE_OTHER): Payer: 59 | Admitting: Family Medicine

## 2018-03-10 ENCOUNTER — Encounter: Payer: Self-pay | Admitting: Physical Therapy

## 2018-03-10 ENCOUNTER — Ambulatory Visit: Payer: 59 | Admitting: Physical Therapy

## 2018-03-10 VITALS — BP 107/73 | HR 75 | Temp 98.2°F | Ht 73.0 in | Wt 242.0 lb

## 2018-03-10 DIAGNOSIS — E669 Obesity, unspecified: Secondary | ICD-10-CM

## 2018-03-10 DIAGNOSIS — M6281 Muscle weakness (generalized): Secondary | ICD-10-CM | POA: Diagnosis not present

## 2018-03-10 DIAGNOSIS — Z6832 Body mass index (BMI) 32.0-32.9, adult: Secondary | ICD-10-CM

## 2018-03-10 DIAGNOSIS — M25572 Pain in left ankle and joints of left foot: Secondary | ICD-10-CM | POA: Diagnosis not present

## 2018-03-10 DIAGNOSIS — E119 Type 2 diabetes mellitus without complications: Secondary | ICD-10-CM

## 2018-03-10 DIAGNOSIS — R262 Difficulty in walking, not elsewhere classified: Secondary | ICD-10-CM | POA: Diagnosis not present

## 2018-03-10 DIAGNOSIS — M62838 Other muscle spasm: Secondary | ICD-10-CM

## 2018-03-10 NOTE — Progress Notes (Signed)
Office: [phone]  /  Fax: [phone]   HPI:   Chief Complaint: OBESITY [name] is here to discuss his progress with his obesity treatment plan. He is on the Category 4 plan and is following his eating plan approximately 80 % of the time. He states he is exercising 0 minutes 0 times per week. [name] continues to do well with weight loss. Hunger is controlled and he is doing well with meal prep and planning. He is recovering from an achilles injury and is trying to slowly work into increasing exercise. His weight is 242 lb (109.8 kg) today and has had a weight loss of 6 pounds over a period of 3 weeks since his last visit. He has lost 7 lbs since starting treatment with [name].  Diabetes II [name] has a diagnosis of diabetes type II. He is doing well with diet controlling glucose levels. He is stable on metformin. [name] denies any hypoglycemic episodes, nausea or vomiting. Last A1c was at 6.2 He has been working on intensive lifestyle modifications including diet, exercise, and weight loss to help control his blood glucose levels.  ALLERGIES: Allergies  Allergen Reactions  . Tomato Other (See Comments)    GI distress    MEDICATIONS: Current Outpatient Medications on File Prior to Visit  Medication Sig Dispense Refill  . buPROPion (WELLBUTRIN) 100 MG tablet Take 100 mg by mouth daily.    [name] esomeprazole (NEXIUM) 40 MG capsule Take 1 capsule (40 mg total) every morning by mouth. 90 capsule 3  . etodolac (LODINE) 500 MG tablet TAKE 1 TABLET (500 MG TOTAL) BY MOUTH 2 (TWO) TIMES DAILY. 60 tablet 2  . FLUoxetine (PROZAC) 20 MG capsule Take 1 capsule (20 mg total) by mouth daily. (Patient taking differently: Take 20 mg by mouth every morning. ) 90 capsule 3  . ipratropium (ATROVENT) 0.03 % nasal spray Place 2 sprays into both nostrils 2 (two) times daily.    . metFORMIN (GLUCOPHAGE-XR) 500 MG 24 hr tablet Take 2,000 mg by mouth at bedtime.   4  . Multiple Vitamin (MULTIVITAMIN) capsule Take  1 capsule by mouth daily.    . valsartan-hydrochlorothiazide (DIOVAN-HCT) 160-12.5 MG tablet Take 1 tablet by mouth daily. (Patient taking differently: Take 1 tablet by mouth every morning. ) 90 tablet 3  . Vitamin D, Ergocalciferol, (DRISDOL) 50000 units CAPS capsule Take 1 capsule (50,000 Units total) by mouth every 7 (seven) days. 4 capsule 0   No current facility-administered medications on file prior to visit.     PAST MEDICAL HISTORY: Past Medical History:  Diagnosis Date  . Chest pain   . Depression   . Diabetes ([place])   . Family history of adverse reaction to anesthesia    MOM-N/V, DAD-HAS PROBLEMS WITH NOVICAINE  . Food allergy   . GERD (gastroesophageal reflux disease)   . History of hiatal hernia   . History of kidney stones    H/O  . Hypertension   . Insomnia   . Joint pain   . Plantar fasciitis   . Seasonal allergies   . Tendonitis of foot    left    PAST SURGICAL HISTORY: Past Surgical History:  Procedure Laterality Date  . CARPAL TUNNEL RELEASE Right 07/03/2017   Procedure: CARPAL TUNNEL RELEASE;  Surgeon: Hessie Knows, MD;  Location: [facility] ORS;  Service: Orthopedics;  Laterality: Right;  . clavide surgery  2005  . KNEE SURGERY Right 2013  . SHOULDER SURGERY Right 2008    SOCIAL HISTORY: Social History  Tobacco Use  . Smoking status: Never Smoker  . Smokeless tobacco: Never Used  Substance Use Topics  . Alcohol use: Yes    Alcohol/week: 0.0 oz    Comment: RARE  . Drug use: No    FAMILY HISTORY: Family History  Problem Relation Age of Onset  . Cancer Mother        colon/rectal  . Hyperlipidemia Mother   . Cancer Brother        h[place]  . Cancer Maternal Grandfather        lung  . Stroke Maternal Grandfather   . Diabetes Father   . Hypertension Father   . Hyperlipidemia Father   . Heart disease Father   . Alcoholism Father     ROS: Review of Systems  Constitutional: Positive for weight loss.  Gastrointestinal: Negative for nausea  and vomiting.  Endo/Heme/Allergies:       Negative for hypoglycemia    PHYSICAL EXAM: Blood pressure 107/73, pulse 75, temperature 98.2 F (36.8 C), temperature source Oral, height 6\' 1"  (1.854 m), weight 242 lb (109.8 kg), SpO2 99 %. Body mass index is 31.93 kg/m. Physical Exam  Constitutional: He is oriented to person, place, and time. He appears well-developed and well-nourished.  Cardiovascular: Normal rate.  Pulmonary/Chest: Effort normal.  Musculoskeletal: Normal range of motion.  Neurological: He is oriented to person, place, and time.  Skin: Skin is warm and dry.  Psychiatric: He has a normal mood and affect. His behavior is normal.  Vitals reviewed.   RECENT LABS AND TESTS: BMET    Component Value Date/Time   NA 138 01/28/2018 1007   NA 138 05/20/2013 0815   K 4.2 01/28/2018 1007   K 3.7 05/20/2013 0815   CL 101 01/28/2018 1007   CL 106 05/20/2013 0815   CO2 21 01/28/2018 1007   CO2 26 05/20/2013 0815   GLUCOSE 105 (H) 01/28/2018 1007   GLUCOSE 101 (H) 05/20/2013 0815   BUN 18 01/28/2018 1007   BUN 20 (H) 05/20/2013 0815   CREATININE 0.97 01/28/2018 1007   CREATININE 0.99 05/20/2013 0815   CALCIUM 9.5 01/28/2018 1007   CALCIUM 8.9 05/20/2013 0815   GFRNONAA 95 01/28/2018 1007   GFRNONAA >60 05/20/2013 0815   GFRAA 109 01/28/2018 1007   GFRAA >60 05/20/2013 0815   Lab Results  Component Value Date   HGBA1C 6.2 (H) 01/28/2018   HGBA1C 6.7 11/07/2017   HGBA1C 6.3 05/08/2017   HGBA1C 6.5 11/06/2016   HGBA1C 10.6 (H) 11/06/2011   Lab Results  Component Value Date   INSULIN 10.5 01/28/2018   CBC    Component Value Date/Time   WBC 6.4 01/28/2018 1007   WBC 9.8 11/05/2011 1606   RBC 5.16 01/28/2018 1007   RBC 5.61 11/05/2011 1606   HGB 14.6 01/28/2018 1007   HCT 43.5 01/28/2018 1007   PLT 250 06/23/2017 1104   MCV 84 01/28/2018 1007   MCV 84 11/05/2011 1606   MCH 28.3 01/28/2018 1007   MCH 29.1 11/05/2011 1606   MCHC 33.6 01/28/2018 1007    MCHC 34.6 11/05/2011 1606   RDW 14.3 01/28/2018 1007   RDW 13.0 11/05/2011 1606   LYMPHSABS 1.5 01/28/2018 1007   LYMPHSABS 1.8 11/05/2011 1606   MONOABS 0.6 11/05/2011 1606   EOSABS 0.2 01/28/2018 1007   EOSABS 0.1 11/05/2011 1606   BASOSABS 0.0 01/28/2018 1007   BASOSABS 0.0 11/05/2011 1606   Iron/TIBC/Ferritin/ %Sat No results found for: IRON, TIBC, FERRITIN, IRONPCTSAT Lipid Panel  Component Value Date/Time   CHOL 185 01/28/2018 1007   TRIG 109 01/28/2018 1007   HDL 63 01/28/2018 1007   CHOLHDL 3.5 06/23/2017 1104   LDLCALC 100 (H) 01/28/2018 1007   Hepatic Function Panel     Component Value Date/Time   PROT 7.0 01/28/2018 1007   PROT 8.5 (H) 11/05/2011 1606   ALBUMIN 4.4 01/28/2018 1007   ALBUMIN 4.5 11/05/2011 1606   AST 20 01/28/2018 1007   AST 36 11/05/2011 1606   ALT 20 01/28/2018 1007   ALT 53 11/05/2011 1606   ALKPHOS 70 01/28/2018 1007   ALKPHOS 147 (H) 11/05/2011 1606   BILITOT 0.3 01/28/2018 1007   BILITOT 0.5 11/05/2011 1606      Component Value Date/Time   TSH 1.330 01/28/2018 1007   TSH 1.440 01/04/2016 0818   Results for Helf, Hurshell C "NED" (MRN [id number]) as of 03/10/2018 10:43  Ref. Range 01/28/2018 10:07  Vitamin D, 25-Hydroxy Latest Ref Range: 30.0 - 100.0 ng/mL 27.0 (L)   ASSESSMENT AND PLAN: Type 2 diabetes mellitus without complication, without long-term current use of insulin (HCC)  Class 1 obesity with serious comorbidity and body mass index (BMI) of 32.0 to 32.9 in adult, unspecified obesity type  PLAN:  Diabetes II Maguire has been given extensive diabetes education by myself today including ideal fasting and post-prandial blood glucose readings, individual ideal Hgb A1c goals and hypoglycemia prevention. We discussed the importance of good blood sugar control to decrease the likelihood of diabetic complications such as nephropathy, neuropathy, limb loss, blindness, coronary artery disease, and death. We discussed the importance  of intensive lifestyle modification including diet, exercise and weight loss as the first line treatment for diabetes. Maxden agrees to continue diet and exercise and will follow up at the agreed upon time. We will recheck labs in 3 months.   We spent > than 50% of the 15 minute visit on the counseling as documented in the note.  Obesity [name] is currently in the action stage of change. As such, his goal is to continue with weight loss efforts He has agreed to follow the Category 4 plan Claris has been instructed to work up to a goal of 150 minutes of combined cardio and strengthening exercise per week for weight loss and overall health benefits. We discussed the following Behavioral Modification Strategies today: increasing lean protein intake, decreasing simple carbohydrates  and work on meal planning and easy cooking plans  Zayaan has agreed to follow up with our clinic in 3 weeks. He was informed of the importance of frequent follow up visits to maximize his success with intensive lifestyle modifications for his multiple health conditions.   OBESITY BEHAVIORAL INTERVENTION VISIT  Today's visit was # 3 out of 22.  Starting weight: 249 lbs Starting date: 01/28/18 Today's weight : 242 lbs  Today's date: 03/10/2018 Total lbs lost to date: 7 (Patients must lose 7 lbs in the first 6 months to continue with counseling)   ASK: We discussed the diagnosis of obesity with Sierra today and Tharun agreed to give [name] permission to discuss obesity behavioral modification therapy today.  ASSESS: Osiris has the diagnosis of obesity and his BMI today is 31.93 Haley is in the action stage of change   ADVISE: Martavius was educated on the multiple health risks of obesity as well as the benefit of weight loss to improve his health. He was advised of the need for long term treatment and the importance of lifestyle modifications.  AGREE: Multiple dietary modification options and treatment  options were discussed and  Bekim agreed to the above obesity treatment plan.  I, Doreene Nest, am acting as transcriptionist for Dennard Nip, MD  I have reviewed the above documentation for accuracy and completeness, and I agree with the above. -Dennard Nip, MD

## 2018-03-11 NOTE — Therapy (Signed)
[facility] [address] [place], [address] Phone: [phone]   Fax:  [phone]  Physical Therapy Treatment  Patient Details  Name: [name] MRN: [id number] Date of Birth: Apr 01, 1976 Referring Provider: Samara Deist DPM   Encounter Date: 03/10/2018  PT End of Session - 03/10/18 0822    Visit Number  11    Number of Visits  12    Date for PT Re-Evaluation  03/17/18    PT Start Time  0733    PT Stop Time  0815    PT Time Calculation (min)  42 min    Activity Tolerance  Patient tolerated treatment well    Behavior During Therapy  [facility] for tasks assessed/performed       Past Medical History:  Diagnosis Date  . Chest pain   . Depression   . Diabetes ([place])   . Family history of adverse reaction to anesthesia    MOM-N/V, DAD-HAS PROBLEMS WITH NOVICAINE  . Food allergy   . GERD (gastroesophageal reflux disease)   . History of hiatal hernia   . History of kidney stones    H/O  . Hypertension   . Insomnia   . Joint pain   . Plantar fasciitis   . Seasonal allergies   . Tendonitis of foot    left    Past Surgical History:  Procedure Laterality Date  . CARPAL TUNNEL RELEASE Right 07/03/2017   Procedure: CARPAL TUNNEL RELEASE;  Surgeon: Hessie Knows, MD;  Location: [facility] ORS;  Service: Orthopedics;  Laterality: Right;  . clavide surgery  2005  . KNEE SURGERY Right 2013  . SHOULDER SURGERY Right 2008    There were no vitals filed for this visit.  Subjective Assessment - 03/10/18 0737    Subjective  Patient reports he is continuing to do well.     Pertinent History  long history of pain in left heel beginning about 4-5 years ago with episodes of exacerbation of symptoms each year. He has had physical therapy in the past with good results. He has a bone spur on his heel that is contributing to his chronic condition.     Limitations  Standing;Walking    How long can you walk comfortably?   <10 min without boot; able to walk as long as he wants when in the boot    Diagnostic tests  X ray: per MD notes: lateral calcaneal axial of the left heel shows the posterior exostosis. comparison to 2016 shows no major changes    Patient Stated Goals  resume normal activity with walking, working without boot    Currently in Pain?  No/denies         Observation:  Palpation: mild spasms along left calf muscle proximal attachment   Treatment: Manual therapy: 10 min goal: improved soft tissue elasticity, pain  STM performed to left calf lateral aspect and medial aspect, superficial and deep techniques; patient prone lying    Modalities:  Ultrasound: pulsed 50% 1MHz 1.3w/cm2 x 10 min to left heel/Achilles tendon with patient prone with LE supported on pilllow for pain, spasms   Iontophoresis with dexamethasone 4mg /ml @ 80ma*min; (5 min to apply) small electrode to posterior aspect of heel left LE with patient prone lying; no adverse reactions noted   Electrical stimulation: 15 min: Russian stim. 10/10 cycle applied (2) electrodes to left calf for muscle re education with patient prone with left LE supported on pillow; goal muscle re education  Patient response to treatment: improved soft tissue elasitcity 30% following STM, no adverse reactions to iontophoresis.         PT Education - 03/10/18 [phone]    Education provided  Yes    Education Details  HEP    Person(s) Educated  Patient    Methods  Explanation    Comprehension  Verbalized understanding          PT Long Term Goals - 02/03/18 1135      PT LONG TERM GOAL #1   Title  Patient will demonstrate improved function with daily tasks with less pain and impairment as indicated by FADI (foot/ankle disability index) of 40% or less     Baseline  FADI 52%    Status  New    Target Date  02/24/18      PT LONG TERM GOAL #2   Title  Patient will demonstrate improved function with daily tasks with less pain and impairment as  indicated by FADI (foot/ankle disability index) of 25% or less     Baseline  FADI 52%    Status  New    Target Date  03/17/18      PT LONG TERM GOAL #3   Title  Patient will be independent in HEP for exercise and self management of pain and be able to walk without CAM walker boot with <2/10 left heel demonstrating improved function with daily tasks at home/work    Baseline  limited knowledge of pain control strategies and self managment of symptoms, exercises; ambulating with CAM walker boot    Status  New    Target Date  03/17/18            Plan - 03/10/18 7893    Clinical Impression Statement  Patient continues steady progress with goals and function with walking and daily tasks with minimal pain or exacerbations.    Rehab Potential  Fair    Clinical Impairments Affecting Rehab Potential  chronicity of issue, pain, bone spur identified, hx of DM    PT Frequency  2x / week    PT Duration  6 weeks    PT Treatment/Interventions  ADLs/Self Care Home Management;Cryotherapy;Iontophoresis 4mg /ml Dexamethasone;Therapeutic exercise;Balance training;Neuromuscular re-education;Patient/family education;Manual techniques;Passive range of motion;Dry needling;Taping;Electrical Stimulation;Ultrasound    PT Next Visit Plan  manual techniquec iontophoresis with Dexamethasone, isometric exercise; assess gait without boot    PT Home Exercise Plan  pain control, STM left calf, gentle sretching left calf       Patient will benefit from skilled therapeutic intervention in order to improve the following deficits and impairments:  Abnormal gait, Increased fascial restricitons, Pain, Increased muscle spasms, Decreased activity tolerance, Decreased strength, Impaired perceived functional ability, Difficulty walking  Visit Diagnosis: Pain in left ankle and joints of left foot  Muscle weakness (generalized)  Other muscle spasm  Difficulty in walking, not elsewhere classified     Problem  List Patient Active Problem List   Diagnosis Date Noted  . Other fatigue 01/28/2018  . Shortness of breath on exertion 01/28/2018  . Type 2 diabetes mellitus without complication, without long-term current use of insulin ([place]) 01/28/2018  . Seasonal affective disorder ([place]) 12/23/2017  . Seasonal allergic rhinitis 06/24/2017  . Hyperlipidemia associated with type 2 diabetes mellitus ([place]) 03/12/2017  . Osteoarthritis of multiple joints 10/08/2016  . Pain of right thumb 05/10/2016  . Plantar fasciitis 02/25/2016  . Achilles tendinitis 02/25/2016  . Type 2 diabetes mellitus with other specified complication ([place]) 81/09/7508  . GERD (  gastroesophageal reflux disease) 01/02/2016  . Essential hypertension 06/27/2015  . Depression 06/27/2015    Jomarie Longs PT 03/11/2018, 12:46 PM  [facility] [facility] [address], [place], [address] Phone: [phone]   Fax:  [phone]  Name: JOHNTAY DOOLEN MRN: [id number] Date of Birth: 1975-11-03

## 2018-03-12 ENCOUNTER — Ambulatory Visit: Payer: 59 | Admitting: Physical Therapy

## 2018-03-12 ENCOUNTER — Encounter: Payer: Self-pay | Admitting: Physical Therapy

## 2018-03-12 DIAGNOSIS — R262 Difficulty in walking, not elsewhere classified: Secondary | ICD-10-CM | POA: Diagnosis not present

## 2018-03-12 DIAGNOSIS — M25572 Pain in left ankle and joints of left foot: Secondary | ICD-10-CM | POA: Diagnosis not present

## 2018-03-12 DIAGNOSIS — M6281 Muscle weakness (generalized): Secondary | ICD-10-CM | POA: Diagnosis not present

## 2018-03-12 DIAGNOSIS — M62838 Other muscle spasm: Secondary | ICD-10-CM | POA: Diagnosis not present

## 2018-03-12 NOTE — Therapy (Signed)
[facility] [address], [place], [address] Phone: [phone]   Fax:  [phone]  Physical Therapy Treatment Discharge Summary  Patient Details  Name: [name] MRN: [id number] Date of Birth: 1976-06-26 Referring Provider: Samara Deist DPM   Encounter Date: 03/12/2018   Patient began physical therapy on 02/03/2018  and has attended 12 sessions through 03/12/2018. He has achieved all goals and is independent in home program for continued self management of pain/symptoms and exercises as instructed. Plan discharge from physical therapy at this time.    PT End of Session - 03/12/18 0739    Visit Number  12    Number of Visits  12    Date for PT Re-Evaluation  03/17/18    PT Start Time  0738    PT Stop Time  0819    PT Time Calculation (min)  41 min    Activity Tolerance  Patient tolerated treatment well    Behavior During Therapy  [facility] for tasks assessed/performed       Past Medical History:  Diagnosis Date  . Chest pain   . Depression   . Diabetes ([place])   . Family history of adverse reaction to anesthesia    MOM-N/V, DAD-HAS PROBLEMS WITH NOVICAINE  . Food allergy   . GERD (gastroesophageal reflux disease)   . History of hiatal hernia   . History of kidney stones    H/O  . Hypertension   . Insomnia   . Joint pain   . Plantar fasciitis   . Seasonal allergies   . Tendonitis of foot    left    Past Surgical History:  Procedure Laterality Date  . CARPAL TUNNEL RELEASE Right 07/03/2017   Procedure: CARPAL TUNNEL RELEASE;  Surgeon: Hessie Knows, MD;  Location: [facility] ORS;  Service: Orthopedics;  Laterality: Right;  . clavide surgery  2005  . KNEE SURGERY Right 2013  . SHOULDER SURGERY Right 2008    There were no vitals filed for this visit.  Subjective Assessment - 03/12/18 0745    Subjective  Patient reports he is doing well and agrees to discharge from physical therapy to  independent home program and self managment.     Pertinent History  long history of pain in left heel beginning about 4-5 years ago with episodes of exacerbation of symptoms each year. He has had physical therapy in the past with good results. He has a bone spur on his heel that is contributing to his chronic condition.     Limitations  Standing;Walking    How long can you walk comfortably?  <10 min without boot; able to walk as long as he wants when in the boot    Diagnostic tests  X ray: per MD notes: lateral calcaneal axial of the left heel shows the posterior exostosis. comparison to 2016 shows no major changes    Patient Stated Goals  resume normal activity with walking, working without boot    Currently in Pain?  No/denies          Observation:  Palpation: Left calf/heel non tender to palpation along calf and posterior heel  Strength: left ankle WFL DF, PF, inversion/eversion outcome measure: FADI 6%    Treatment: Therapeutic Exercise: patient performed, was instructed in home exercises to continue with demonstration, verbal cues of therapist with handout given  standing 3 way calf raises with isometric hold mid ranges  standing side stepping with resistive band around thighs  toe intrinsic exercises with patient performing x 10 reps walking on diagonal with weights in hands   Modalities:  Ultrasound: pulsed 50% 1MHz 1.3w/cm2 x 10 min to left heel/Achilles tendon with patient prone with LE supported on pilllow for pain, spasms   Iontophoresis with dexamethasone 4mg /ml @ 65ma*min; (5 min to apply) small electrode to posterior aspect of heel left LE with patient prone lying; no adverse reactions noted   Electrical stimulation: 15 min: Russian stim. 10/10 cycle applied (2) electrodes to left calf for muscle re education with patient prone with left LE supported on pillow; goal muscle re education   Patient response to treatment: improved contraction left calf with estim. patient  verbalized good understanding of home program        PT Education - 03/12/18 0826    Education provided  Yes    Education Details  re assessed home program    Person(s) Educated  Patient    Methods  Explanation;Demonstration;Verbal cues    Comprehension  Verbalized understanding;Returned demonstration;Verbal cues required          PT Long Term Goals - 03/12/18 0829      PT LONG TERM GOAL #1   Title  Patient will demonstrate improved function with daily tasks with less pain and impairment as indicated by FADI (foot/ankle disability index) of 40% or less     Baseline  FADI 52%; FADI 6%    Status  Achieved      PT LONG TERM GOAL #2   Title  Patient will demonstrate improved function with daily tasks with less pain and impairment as indicated by FADI (foot/ankle disability index) of 25% or less     Baseline  FADI 52%; FADI 03/12/18 6%    Status  Achieved      PT LONG TERM GOAL #3   Title  Patient will be independent in HEP for exercise and self management of pain and be able to walk without CAM walker boot with <2/10 left heel demonstrating improved function with daily tasks at home/work    Baseline  limited knowledge of pain control strategies and self managment of symptoms, exercises; ambulating with CAM walker boot; 03/12/18 Independent with home program and ambulaitng independently without AD /boot with mild to no pain    Status  Achieved            Plan - 03/12/18 0836    Clinical Impression Statement  Patient has achieved all goals and is ready for discharge from physical therapy at this time. He reports he is at least 80% improved since beginning physical therapy and is able to walk without boot. His FADI improved from 52% to 6% self perceived impairment.      Rehab Potential  Fair    Clinical Impairments Affecting Rehab Potential  chronicity of issue, pain, bone spur identified, hx of DM    PT Frequency  2x / week    PT Duration  6 weeks    PT  Treatment/Interventions  ADLs/Self Care Home Management;Cryotherapy;Iontophoresis 4mg /ml Dexamethasone;Therapeutic exercise;Balance training;Neuromuscular re-education;Patient/family education;Manual techniques;Passive range of motion;Dry needling;Taping;Electrical Stimulation;Ultrasound    PT Next Visit Plan  discharge    PT Home Exercise Plan  pain control, STM left calf, gentle sretching left calf; calf raises isometrics, monster diagonal weddign march walk, side stepping with resistive band, toe intrinsic exercises       Patient will benefit from skilled therapeutic intervention in order to improve the following deficits and impairments:  Abnormal gait, Increased fascial restricitons,  Pain, Increased muscle spasms, Decreased activity tolerance, Decreased strength, Impaired perceived functional ability, Difficulty walking  Visit Diagnosis: Pain in left ankle and joints of left foot  Muscle weakness (generalized)  Other muscle spasm  Difficulty in walking, not elsewhere classified     Problem List Patient Active Problem List   Diagnosis Date Noted  . Other fatigue 01/28/2018  . Shortness of breath on exertion 01/28/2018  . Type 2 diabetes mellitus without complication, without long-term current use of insulin ([place]) 01/28/2018  . Seasonal affective disorder ([place]) 12/23/2017  . Seasonal allergic rhinitis 06/24/2017  . Hyperlipidemia associated with type 2 diabetes mellitus ([place]) 03/12/2017  . Osteoarthritis of multiple joints 10/08/2016  . Pain of right thumb 05/10/2016  . Plantar fasciitis 02/25/2016  . Achilles tendinitis 02/25/2016  . Type 2 diabetes mellitus with other specified complication ([place]) 88/28/0034  . GERD (gastroesophageal reflux disease) 01/02/2016  . Essential hypertension 06/27/2015  . Depression 06/27/2015    Jomarie Longs PT 03/12/2018, 10:34 PM  [place] [address], [place],  [address] Phone: [phone]   Fax:  [phone]  Name: [name] MRN: [id number] Date of Birth: 08/13/1976

## 2018-03-17 ENCOUNTER — Ambulatory Visit: Payer: 59 | Admitting: Physical Therapy

## 2018-03-24 ENCOUNTER — Ambulatory Visit: Payer: 59 | Admitting: Physical Therapy

## 2018-03-26 ENCOUNTER — Encounter: Payer: 59 | Admitting: Physical Therapy

## 2018-03-26 ENCOUNTER — Other Ambulatory Visit: Payer: Self-pay | Admitting: Family Medicine

## 2018-03-26 DIAGNOSIS — I10 Essential (primary) hypertension: Secondary | ICD-10-CM

## 2018-04-01 ENCOUNTER — Ambulatory Visit (INDEPENDENT_AMBULATORY_CARE_PROVIDER_SITE_OTHER): Payer: 59 | Admitting: Family Medicine

## 2018-04-01 VITALS — BP 116/79 | HR 83 | Temp 98.2°F | Ht 73.0 in | Wt 239.0 lb

## 2018-04-01 DIAGNOSIS — Z6831 Body mass index (BMI) 31.0-31.9, adult: Secondary | ICD-10-CM | POA: Diagnosis not present

## 2018-04-01 DIAGNOSIS — Z9189 Other specified personal risk factors, not elsewhere classified: Secondary | ICD-10-CM

## 2018-04-01 DIAGNOSIS — E119 Type 2 diabetes mellitus without complications: Secondary | ICD-10-CM | POA: Diagnosis not present

## 2018-04-01 DIAGNOSIS — E669 Obesity, unspecified: Secondary | ICD-10-CM

## 2018-04-01 DIAGNOSIS — E559 Vitamin D deficiency, unspecified: Secondary | ICD-10-CM | POA: Diagnosis not present

## 2018-04-01 MED ORDER — METFORMIN HCL 1000 MG PO TABS: 1000.0000 mg | ORAL_TABLET | Freq: Two times a day (BID) | ORAL | 0 refills | Status: DC

## 2018-04-01 MED ORDER — VITAMIN D (ERGOCALCIFEROL) 1.25 MG (50000 UNIT) PO CAPS: 50000.0000 [IU] | ORAL_CAPSULE | ORAL | 0 refills | Status: DC

## 2018-04-01 NOTE — Progress Notes (Signed)
Office: [phone]  /  Fax: [phone]   HPI:   Chief Complaint: OBESITY [name] is here to discuss his progress with his obesity treatment plan. He is on the Category 4 plan and is following his eating plan approximately 80 % of the time. He states he is walking for 30+ minutes 2 to 4 times per week. [name] continues to do well with weight loss, but is struggling with polyphagia in the afternoons. He is eating all his food and following his plan pretty well overall. His weight is 239 lb (108.4 kg) today and has had a weight loss of 3 pounds over a period of 3 weeks since his last visit. He has lost 10 lbs since starting treatment with [name].  Diabetes II [name] has a diagnosis of diabetes type II. His blood sugar is stable. [name] does not have a blood glucose log today and he denies any hypoglycemic episodes. Last A1c was at 6.2. He is on extended release metformin at night, but notes increased afternoon polyphagia. He is eating all his food. He has been working on intensive lifestyle modifications including diet, exercise, and weight loss to help control his blood glucose levels.  At risk for cardiovascular disease [name] is at a higher than average risk for cardiovascular disease due to obesity and diabetes. He currently denies any chest pain.  Vitamin D deficiency [name] has a diagnosis of vitamin D deficiency. He is stable on vit D but is not yet at goal. [name] denies nausea, vomiting or muscle weakness.  ALLERGIES: Allergies  Allergen Reactions  . Tomato Other (See Comments)    GI distress    MEDICATIONS: Current Outpatient Medications on File Prior to Visit  Medication Sig Dispense Refill  . buPROPion (WELLBUTRIN) 100 MG tablet Take 100 mg by mouth daily.    [name] esomeprazole (NEXIUM) 40 MG capsule Take 1 capsule (40 mg total) every morning by mouth. 90 capsule 3  . etodolac (LODINE) 500 MG tablet TAKE 1 TABLET (500 MG TOTAL) BY MOUTH 2 (TWO) TIMES DAILY. 60 tablet 2  .  FLUoxetine (PROZAC) 20 MG capsule Take 1 capsule (20 mg total) by mouth daily. (Patient taking differently: Take 20 mg by mouth every morning. ) 90 capsule 3  . ipratropium (ATROVENT) 0.03 % nasal spray Place 2 sprays into both nostrils 2 (two) times daily.    . Multiple Vitamin (MULTIVITAMIN) capsule Take 1 capsule by mouth daily.    . valsartan-hydrochlorothiazide (DIOVAN-HCT) 160-12.5 MG tablet Take 1 tablet by mouth every morning. 90 tablet 3   No current facility-administered medications on file prior to visit.     PAST MEDICAL HISTORY: Past Medical History:  Diagnosis Date  . Chest pain   . Depression   . Diabetes ([place])   . Family history of adverse reaction to anesthesia    MOM-N/V, DAD-HAS PROBLEMS WITH NOVICAINE  . Food allergy   . GERD (gastroesophageal reflux disease)   . History of hiatal hernia   . History of kidney stones    H/O  . Hypertension   . Insomnia   . Joint pain   . Plantar fasciitis   . Seasonal allergies   . Tendonitis of foot    left    PAST SURGICAL HISTORY: Past Surgical History:  Procedure Laterality Date  . CARPAL TUNNEL RELEASE Right 07/03/2017   Procedure: CARPAL TUNNEL RELEASE;  Surgeon: Hessie Knows, MD;  Location: [facility] ORS;  Service: Orthopedics;  Laterality: Right;  . clavide surgery  2005  .  KNEE SURGERY Right 2013  . SHOULDER SURGERY Right 2008    SOCIAL HISTORY: Social History   Tobacco Use  . Smoking status: Never Smoker  . Smokeless tobacco: Never Used  Substance Use Topics  . Alcohol use: Yes    Alcohol/week: 0.0 oz    Comment: RARE  . Drug use: No    FAMILY HISTORY: Family History  Problem Relation Age of Onset  . Cancer Mother        colon/rectal  . Hyperlipidemia Mother   . Cancer Brother        hodgekins  . Cancer Maternal Grandfather        lung  . Stroke Maternal Grandfather   . Diabetes Father   . Hypertension Father   . Hyperlipidemia Father   . Heart disease Father   . Alcoholism Father      ROS: Review of Systems  Constitutional: Positive for weight loss.  Cardiovascular: Negative for chest pain.  Gastrointestinal: Negative for nausea and vomiting.  Musculoskeletal:       Negative for muscle weakness  Endo/Heme/Allergies:       Positive for polyphagia Negative for hypoglycemia    PHYSICAL EXAM: Blood pressure 116/79, pulse 83, temperature 98.2 F (36.8 C), temperature source Oral, height 6\' 1"  (1.854 m), weight 239 lb (108.4 kg), SpO2 100 %. Body mass index is 31.53 kg/m. Physical Exam  Constitutional: He is oriented to person, place, and time. He appears well-developed and well-nourished.  Cardiovascular: Normal rate.  Pulmonary/Chest: Effort normal.  Musculoskeletal: Normal range of motion.  Neurological: He is oriented to person, place, and time.  Skin: Skin is warm and dry.  Vitals reviewed.   RECENT LABS AND TESTS: BMET    Component Value Date/Time   NA 138 01/28/2018 1007   NA 138 05/20/2013 0815   K 4.2 01/28/2018 1007   K 3.7 05/20/2013 0815   CL 101 01/28/2018 1007   CL 106 05/20/2013 0815   CO2 21 01/28/2018 1007   CO2 26 05/20/2013 0815   GLUCOSE 105 (H) 01/28/2018 1007   GLUCOSE 101 (H) 05/20/2013 0815   BUN 18 01/28/2018 1007   BUN 20 (H) 05/20/2013 0815   CREATININE 0.97 01/28/2018 1007   CREATININE 0.99 05/20/2013 0815   CALCIUM 9.5 01/28/2018 1007   CALCIUM 8.9 05/20/2013 0815   GFRNONAA 95 01/28/2018 1007   GFRNONAA >60 05/20/2013 0815   GFRAA 109 01/28/2018 1007   GFRAA >60 05/20/2013 0815   Lab Results  Component Value Date   HGBA1C 6.2 (H) 01/28/2018   HGBA1C 6.7 11/07/2017   HGBA1C 6.3 05/08/2017   HGBA1C 6.5 11/06/2016   HGBA1C 10.6 (H) 11/06/2011   Lab Results  Component Value Date   INSULIN 10.5 01/28/2018   CBC    Component Value Date/Time   WBC 6.4 01/28/2018 1007   WBC 9.8 11/05/2011 1606   RBC 5.16 01/28/2018 1007   RBC 5.61 11/05/2011 1606   HGB 14.6 01/28/2018 1007   HCT 43.5 01/28/2018 1007    PLT 250 06/23/2017 1104   MCV 84 01/28/2018 1007   MCV 84 11/05/2011 1606   MCH 28.3 01/28/2018 1007   MCH 29.1 11/05/2011 1606   MCHC 33.6 01/28/2018 1007   MCHC 34.6 11/05/2011 1606   RDW 14.3 01/28/2018 1007   RDW 13.0 11/05/2011 1606   LYMPHSABS 1.5 01/28/2018 1007   LYMPHSABS 1.8 11/05/2011 1606   MONOABS 0.6 11/05/2011 1606   EOSABS 0.2 01/28/2018 1007   EOSABS 0.1 11/05/2011 1606  BASOSABS 0.0 01/28/2018 1007   BASOSABS 0.0 11/05/2011 1606   Iron/TIBC/Ferritin/ %Sat No results found for: IRON, TIBC, FERRITIN, IRONPCTSAT Lipid Panel     Component Value Date/Time   CHOL 185 01/28/2018 1007   TRIG 109 01/28/2018 1007   HDL 63 01/28/2018 1007   CHOLHDL 3.5 06/23/2017 1104   LDLCALC 100 (H) 01/28/2018 1007   Hepatic Function Panel     Component Value Date/Time   PROT 7.0 01/28/2018 1007   PROT 8.5 (H) 11/05/2011 1606   ALBUMIN 4.4 01/28/2018 1007   ALBUMIN 4.5 11/05/2011 1606   AST 20 01/28/2018 1007   AST 36 11/05/2011 1606   ALT 20 01/28/2018 1007   ALT 53 11/05/2011 1606   ALKPHOS 70 01/28/2018 1007   ALKPHOS 147 (H) 11/05/2011 1606   BILITOT 0.3 01/28/2018 1007   BILITOT 0.5 11/05/2011 1606      Component Value Date/Time   TSH 1.330 01/28/2018 1007   TSH 1.440 01/04/2016 0818   Results for Gariepy, Shayne C "NED" (MRN [id number]) as of 04/01/2018 12:08  Ref. Range 01/28/2018 10:07  Vitamin D, 25-Hydroxy Latest Ref Range: 30.0 - 100.0 ng/mL 27.0 (L)   ASSESSMENT AND PLAN: Vitamin D deficiency - Plan: Vitamin D, Ergocalciferol, (DRISDOL) 50000 units CAPS capsule  At risk for heart disease  Type 2 diabetes mellitus without complication, without long-term current use of insulin (HCC) - Plan: metFORMIN (GLUCOPHAGE) 1000 MG tablet  Class 1 obesity with serious comorbidity and body mass index (BMI) of 31.0 to 31.9 in adult, unspecified obesity type  PLAN:  Diabetes II Dauntae has been given extensive diabetes education by myself today including ideal  fasting and post-prandial blood glucose readings, individual ideal Hgb A1c goals and hypoglycemia prevention. We discussed the importance of good blood sugar control to decrease the likelihood of diabetic complications such as nephropathy, neuropathy, limb loss, blindness, coronary artery disease, and death. We discussed the importance of intensive lifestyle modification including diet, exercise and weight loss as the first line treatment for diabetes. Olivier agrees to change metformin to 1,000 BID #60 with no refills (no XR version) as regular release metformin helps with polyphagia slightly better than XR. We may need to look at a GLP-1 if no improvement. Lonnell agrees to follow up at the agreed upon time.  Cardiovascular risk counseling Josearmando was given extended (15 minutes) coronary artery disease prevention counseling today. He is 42 y.o. male and has risk factors for heart disease including obesity and diabetes. We discussed intensive lifestyle modifications today with an emphasis on specific weight loss instructions and strategies. Pt was also informed of the importance of increasing exercise and decreasing saturated fats to help prevent heart disease.  Vitamin D Deficiency Clayborn was informed that low vitamin D levels contributes to fatigue and are associated with obesity, breast, and colon cancer. He agrees to continue to take prescription Vit D @50 ,000 IU every week #4 with no refills and will follow up for routine testing of vitamin D, at least 2-3 times per year. He was informed of the risk of over-replacement of vitamin D and agrees to not increase his dose unless he discusses this with [name] first. Swade agrees to follow up as directed.  Obesity Tyrik is currently in the action stage of change. As such, his goal is to continue with weight loss efforts He has agreed to follow the Category 4 plan Braxten has been instructed to work up to a goal of 150 minutes of combined cardio and  strengthening  exercise per week for weight loss and overall health benefits. We discussed the following Behavioral Modification Strategies today: increasing lean protein intake, no skipping meals and increase H2O intake.  Dameer has agreed to follow up with our clinic in 3 weeks. He was informed of the importance of frequent follow up visits to maximize his success with intensive lifestyle modifications for his multiple health conditions.   OBESITY BEHAVIORAL INTERVENTION VISIT  Today's visit was # 4 out of 22.  Starting weight: 249 lbs Starting date: 01/28/18 Today's weight : 239 lbs  Today's date: 04/01/2018 Total lbs lost to date: 10    ASK: We discussed the diagnosis of obesity with [place] today and Eh agreed to give [name] permission to discuss obesity behavioral modification therapy today.  ASSESS: Antwian has the diagnosis of obesity and his BMI today is 31.54 Ranald is in the action stage of change   ADVISE: Sudais was educated on the multiple health risks of obesity as well as the benefit of weight loss to improve his health. He was advised of the need for long term treatment and the importance of lifestyle modifications.  AGREE: Multiple dietary modification options and treatment options were discussed and  Kean agreed to the above obesity treatment plan.  I, Doreene Nest, am acting as transcriptionist for Dennard Nip, MD  I have reviewed the above documentation for accuracy and completeness, and I agree with the above. -Dennard Nip, MD

## 2018-04-12 ENCOUNTER — Emergency Department
Admission: EM | Admit: 2018-04-12 | Discharge: 2018-04-12 | Disposition: A | Discharge status: Home / Self Care | Payer: 59 | Attending: Emergency Medicine | Admitting: Emergency Medicine

## 2018-04-12 ENCOUNTER — Emergency Department: Payer: 59

## 2018-04-12 ENCOUNTER — Encounter: Payer: Self-pay | Admitting: Emergency Medicine

## 2018-04-12 DIAGNOSIS — Z7984 Long term (current) use of oral hypoglycemic drugs: Secondary | ICD-10-CM | POA: Diagnosis not present

## 2018-04-12 DIAGNOSIS — R55 Syncope and collapse: Secondary | ICD-10-CM | POA: Insufficient documentation

## 2018-04-12 DIAGNOSIS — I10 Essential (primary) hypertension: Secondary | ICD-10-CM | POA: Diagnosis not present

## 2018-04-12 DIAGNOSIS — R1031 Right lower quadrant pain: Secondary | ICD-10-CM | POA: Diagnosis not present

## 2018-04-12 DIAGNOSIS — E785 Hyperlipidemia, unspecified: Secondary | ICD-10-CM | POA: Insufficient documentation

## 2018-04-12 DIAGNOSIS — Z79899 Other long term (current) drug therapy: Secondary | ICD-10-CM | POA: Diagnosis not present

## 2018-04-12 DIAGNOSIS — R197 Diarrhea, unspecified: Secondary | ICD-10-CM | POA: Diagnosis not present

## 2018-04-12 DIAGNOSIS — E119 Type 2 diabetes mellitus without complications: Secondary | ICD-10-CM | POA: Diagnosis not present

## 2018-04-12 DIAGNOSIS — N23 Unspecified renal colic: Secondary | ICD-10-CM | POA: Diagnosis not present

## 2018-04-12 DIAGNOSIS — R1110 Vomiting, unspecified: Secondary | ICD-10-CM | POA: Diagnosis not present

## 2018-04-12 LAB — URINALYSIS, COMPLETE (UACMP) WITH MICROSCOPIC
BACTERIA UA: NONE SEEN
Bilirubin Urine: NEGATIVE
Glucose, UA: NEGATIVE mg/dL
Ketones, ur: NEGATIVE mg/dL
Leukocytes, UA: NEGATIVE
Nitrite: NEGATIVE
PROTEIN: NEGATIVE mg/dL
Specific Gravity, Urine: 1.02 (ref 1.005–1.030)
pH: 5 (ref 5.0–8.0)

## 2018-04-12 LAB — COMPREHENSIVE METABOLIC PANEL
ALT: 19 U/L (ref 0–44)
AST: 22 U/L (ref 15–41)
Albumin: 4.6 g/dL (ref 3.5–5.0)
Alkaline Phosphatase: 63 U/L (ref 38–126)
Anion gap: 11 (ref 5–15)
BUN: 18 mg/dL (ref 6–20)
CHLORIDE: 107 mmol/L (ref 98–111)
CO2: 22 mmol/L (ref 22–32)
CREATININE: 1.07 mg/dL (ref 0.61–1.24)
Calcium: 9.6 mg/dL (ref 8.9–10.3)
GFR calc Af Amer: >60 mL/min (ref >60)
GFR calc non Af Amer: >60 mL/min (ref >60)
Glucose, Bld: 151 mg/dL — ABNORMAL HIGH (ref 70–99)
Potassium: 3.9 mmol/L (ref 3.5–5.1)
SODIUM: 140 mmol/L (ref 135–145)
Total Bilirubin: 0.7 mg/dL (ref 0.3–1.2)
Total Protein: 7.8 g/dL (ref 6.5–8.1)

## 2018-04-12 LAB — CBC WITH DIFFERENTIAL/PLATELET
BASOS ABS: 0.1 10*3/uL (ref 0–0.1)
Basophils Relative: 1 %
EOS PCT: 4 %
Eosinophils Absolute: 0.5 10*3/uL (ref 0–0.7)
HEMATOCRIT: 44.9 % (ref 40.0–52.0)
Hemoglobin: 15.5 g/dL (ref 13.0–18.0)
LYMPHS PCT: 22 %
Lymphs Abs: 2.5 10*3/uL (ref 1.0–3.6)
MCH: 29.2 pg (ref 26.0–34.0)
MCHC: 34.6 g/dL (ref 32.0–36.0)
MCV: 84.4 fL (ref 80.0–100.0)
Monocytes Absolute: 0.9 10*3/uL (ref 0.2–1.0)
Monocytes Relative: 8 %
NEUTROS ABS: 7.2 10*3/uL — AB (ref 1.4–6.5)
Neutrophils Relative %: 65 %
PLATELETS: 324 10*3/uL (ref 150–440)
RBC: 5.31 MIL/uL (ref 4.40–5.90)
RDW: 13.5 % (ref 11.5–14.5)
WBC: 11.1 10*3/uL — AB (ref 3.8–10.6)

## 2018-04-12 LAB — LIPASE, BLOOD: LIPASE: 135 U/L — AB (ref 11–51)

## 2018-04-12 MED ORDER — MORPHINE SULFATE (PF) 4 MG/ML IV SOLN
INTRAVENOUS | Status: AC
  Administered 2018-04-12: 4 mg via INTRAVENOUS
  Filled 2018-04-12: qty 1

## 2018-04-12 MED ORDER — ONDANSETRON HCL 4 MG/2ML IJ SOLN
INTRAMUSCULAR | Status: AC
  Administered 2018-04-12: 4 mg via INTRAVENOUS
  Filled 2018-04-12: qty 2

## 2018-04-12 MED ORDER — MORPHINE SULFATE (PF) 4 MG/ML IV SOLN
4.0000 mg | Freq: Once | INTRAVENOUS | Status: AC
  Administered 2018-04-12: 4 mg via INTRAVENOUS

## 2018-04-12 MED ORDER — IOPAMIDOL (ISOVUE-300) INJECTION 61%
30.0000 mL | Freq: Once | INTRAVENOUS | Status: AC | PRN
  Administered 2018-04-12: 30 mL via ORAL

## 2018-04-12 MED ORDER — KETOROLAC TROMETHAMINE 30 MG/ML IJ SOLN
30.0000 mg | Freq: Once | INTRAMUSCULAR | Status: AC
  Administered 2018-04-12: 30 mg via INTRAVENOUS
  Filled 2018-04-12: qty 1

## 2018-04-12 MED ORDER — OXYCODONE-ACETAMINOPHEN 5-325 MG PO TABS: 1.0000 | ORAL_TABLET | ORAL | 0 refills | Status: DC | PRN

## 2018-04-12 MED ORDER — HYDROMORPHONE HCL 1 MG/ML IJ SOLN
0.5000 mg | Freq: Once | INTRAMUSCULAR | Status: AC
  Administered 2018-04-12: 0.5 mg via INTRAVENOUS
  Filled 2018-04-12: qty 1

## 2018-04-12 MED ORDER — TAMSULOSIN HCL 0.4 MG PO CAPS
0.4000 mg | ORAL_CAPSULE | Freq: Once | ORAL | Status: AC
  Administered 2018-04-12: 0.4 mg via ORAL
  Filled 2018-04-12: qty 1

## 2018-04-12 MED ORDER — ONDANSETRON HCL 4 MG/2ML IJ SOLN
4.0000 mg | Freq: Once | INTRAMUSCULAR | Status: AC
  Administered 2018-04-12: 4 mg via INTRAVENOUS

## 2018-04-12 MED ORDER — IOPAMIDOL (ISOVUE-300) INJECTION 61%
100.0000 mL | Freq: Once | INTRAVENOUS | Status: AC | PRN
  Administered 2018-04-12: 100 mL via INTRAVENOUS

## 2018-04-12 MED ORDER — TAMSULOSIN HCL 0.4 MG PO CAPS: 0.4000 mg | ORAL_CAPSULE | Freq: Every day | ORAL | 0 refills | Status: DC

## 2018-04-12 NOTE — Discharge Instructions (Addendum)
Take the Percocet 1 pill 4 times a day for severe pain you can take 2 pills 4 times a day.  Be careful the Percocet can make you woozy you can fall.  Do not drive on it.  You can also make you constipated.  Please call [facility] to arrange follow-up.  Strain all your urine trying to catch a stone take it with you.  Return here for worse pain fever vomiting or feeling sicker.  Also take the Flomax 1 pill a day.  That may help the stone pass more easily.

## 2018-04-12 NOTE — ED Triage Notes (Signed)
Patient presents to the ED with right lower quadrant pain since Friday with nausea and some diarrhea.  Patient reports 2-3 episodes of diarrhea today.  Patient appears pale and diaphoretic.  Patient reports tenderness to RLQ.

## 2018-04-12 NOTE — ED Notes (Signed)

## 2018-04-12 NOTE — ED Triage Notes (Signed)
First Nurse Note:  C/O Right sided abdominal pain.

## 2018-04-12 NOTE — ED Provider Notes (Signed)
[facility] Emergency Department Provider Note   ____________________________________________   First MD Initiated Contact with Patient 04/12/18 1825     (approximate)  I have reviewed the triage vital signs and the nursing notes.   HISTORY  Chief Complaint Abdominal Pain    HPI [name] is a 42 y.o. male patient reports right lower quadrant pain is kind of diffuse initially seem to get better with Gas-X but then has localized in the right lower quadrant he has had some diarrhea as well.  Little bit of nausea occasionally appetite he says is still good.  Pain is worse with movement or hitting bumps in the road.  It is moderate at the present time, deep and achy.  Patient had shoulder shoulder surgery but has not had any other surgeries.    Past Medical History:  Diagnosis Date  . Chest pain   . Depression   . Diabetes ([place])   . Family history of adverse reaction to anesthesia    MOM-N/V, DAD-HAS PROBLEMS WITH NOVICAINE  . Food allergy   . GERD (gastroesophageal reflux disease)   . History of hiatal hernia   . History of kidney stones    H/O  . Hypertension   . Insomnia   . Joint pain   . Plantar fasciitis   . Seasonal allergies   . Tendonitis of foot    left    Patient Active Problem List   Diagnosis Date Noted  . Other fatigue 01/28/2018  . Shortness of breath on exertion 01/28/2018  . Type 2 diabetes mellitus without complication, without long-term current use of insulin ([place]) 01/28/2018  . Seasonal affective disorder ([place]) 12/23/2017  . Seasonal allergic rhinitis 06/24/2017  . Hyperlipidemia associated with type 2 diabetes mellitus ([place]) 03/12/2017  . Osteoarthritis of multiple joints 10/08/2016  . Pain of right thumb 05/10/2016  . Plantar fasciitis 02/25/2016  . Achilles tendinitis 02/25/2016  . Type 2 diabetes mellitus with other specified complication ([place]) 16/06/9603  . GERD (gastroesophageal reflux disease) 01/02/2016    . Essential hypertension 06/27/2015  . Depression 06/27/2015    Past Surgical History:  Procedure Laterality Date  . CARPAL TUNNEL RELEASE Right 07/03/2017   Procedure: CARPAL TUNNEL RELEASE;  Surgeon: Hessie Knows, MD;  Location: [facility] ORS;  Service: Orthopedics;  Laterality: Right;  . clavide surgery  2005  . KNEE SURGERY Right 2013  . SHOULDER SURGERY Right 2008    Prior to Admission medications   Medication Sig Start Date End Date Taking? Authorizing Provider  buPROPion (WELLBUTRIN) 100 MG tablet Take 100 mg by mouth daily.    [provider]  esomeprazole (NEXIUM) 40 MG capsule Take 1 capsule (40 mg total) every morning by mouth. 07/30/17   Karamalegos, Devonne Doughty, DO  etodolac (LODINE) 500 MG tablet TAKE 1 TABLET (500 MG TOTAL) BY MOUTH 2 (TWO) TIMES DAILY. 02/23/18   Karamalegos, Devonne Doughty, DO  FLUoxetine (PROZAC) 20 MG capsule Take 1 capsule (20 mg total) by mouth daily. Patient taking differently: Take 20 mg by mouth every morning.  01/10/17   Karamalegos, Devonne Doughty, DO  ipratropium (ATROVENT) 0.03 % nasal spray Place 2 sprays into both nostrils 2 (two) times daily.    [provider]  metFORMIN (GLUCOPHAGE) 1000 MG tablet Take 1 tablet (1,000 mg total) by mouth 2 (two) times daily with a meal. 04/01/18   Dennard Nip D, MD  Multiple Vitamin (MULTIVITAMIN) capsule Take 1 capsule by mouth daily.    [provider]  oxyCODONE-acetaminophen (PERCOCET) 5-325 MG tablet Take 1 tablet by mouth every 4 (four) hours as needed for severe pain. 04/12/18 04/12/19  Nena Polio, MD  tamsulosin (FLOMAX) 0.4 MG CAPS capsule Take 1 capsule (0.4 mg total) by mouth daily. 04/12/18   Nena Polio, MD  valsartan-hydrochlorothiazide (DIOVAN-HCT) 160-12.5 MG tablet Take 1 tablet by mouth every morning. 03/26/18   Olin Hauser, DO  Vitamin D, Ergocalciferol, (DRISDOL) 50000 units CAPS capsule Take 1 capsule (50,000 Units total) by mouth every 7 (seven) days.  04/01/18   Dennard Nip D, MD    Allergies Tomato  Family History  Problem Relation Age of Onset  . Cancer Mother        colon/rectal  . Hyperlipidemia Mother   . Cancer Brother        hodgekins  . Cancer Maternal Grandfather        lung  . Stroke Maternal Grandfather   . Diabetes Father   . Hypertension Father   . Hyperlipidemia Father   . Heart disease Father   . Alcoholism Father     Social History Social History   Tobacco Use  . Smoking status: Never Smoker  . Smokeless tobacco: Never Used  Substance Use Topics  . Alcohol use: Yes    Alcohol/week: 0.0 oz    Comment: RARE  . Drug use: No    Review of Systems  Constitutional: No fever/chills Eyes: No visual changes. ENT: No sore throat. Cardiovascular: Denies chest pain. Respiratory: Denies shortness of breath. Gastrointestinal: abdominal pain.   nausea, no vomiting.   diarrhea.  No constipation. Genitourinary: Negative for dysuria. Musculoskeletal: Negative for back pain. Skin: Negative for rash. Neurological: Negative for headaches, focal weakness   ____________________________________________   PHYSICAL EXAM:  VITAL SIGNS: ED Triage Vitals  Enc Vitals Group     BP 04/12/18 1812 (!) 155/103     Pulse Rate 04/12/18 1812 66     Resp 04/12/18 1812 18     Temp 04/12/18 1812 98.3 F (36.8 C)     Temp Source 04/12/18 1812 Oral     SpO2 04/12/18 1812 98 %     Weight 04/12/18 1813 240 lb (108.9 kg)     Height 04/12/18 1813 6\' 1"  (1.854 m)     Head Circumference --      Peak Flow --      Pain Score 04/12/18 1813 6     Pain Loc --      Pain Edu? --      Excl. in [place]? --     Constitutional: Alert and oriented. Well appearing and in no acute distress. Eyes: Conjunctivae are normal.  Head: Atraumatic. Nose: No congestion/rhinnorhea. Mouth/Throat: Mucous membranes are moist.  Oropharynx non-erythematous. Neck: No stridor.  Cardiovascular: Normal rate, regular rhythm. Grossly normal heart sounds.   Good peripheral circulation. Respiratory: Normal respiratory effort.  No retractions. Lungs CTAB. Gastrointestinal: Soft tender to palpation percussion in the right lower quadrant no distention. No abdominal bruits. No CVA tenderness. Musculoskeletal: No lower extremity tenderness nor edema.  No joint effusions. Neurologic:  Normal speech and language. No gross focal neurologic deficits are appreciated. No gait instability. Skin:  Skin is warm, dry and intact. No rash noted. Psychiatric: Mood and affect are normal. Speech and behavior are normal.  ____________________________________________   LABS (all labs ordered are listed, but only abnormal results are displayed)  Labs Reviewed  LIPASE, BLOOD - Abnormal; Notable for the following components:  Result Value   Lipase 135 (*)    All other components within normal limits  COMPREHENSIVE METABOLIC PANEL - Abnormal; Notable for the following components:   Glucose, Bld 151 (*)    All other components within normal limits  URINALYSIS, COMPLETE (UACMP) WITH MICROSCOPIC - Abnormal; Notable for the following components:   Color, Urine YELLOW (*)    APPearance CLEAR (*)    Hgb urine dipstick MODERATE (*)    All other components within normal limits  CBC WITH DIFFERENTIAL/PLATELET - Abnormal; Notable for the following components:   WBC 11.1 (*)    Neutro Abs 7.2 (*)    All other components within normal limits   ____________________________________________  EKG  ____________________________________________  RADIOLOGY  ED MD interpretation: CT of the abdomen shows kidney stone  Official radiology report(s): Ct Abdomen Pelvis W Contrast  Result Date: 04/12/2018 CLINICAL DATA:  Right lower quadrant pain since Friday, nausea, diarrhea, vomiting EXAM: CT ABDOMEN AND PELVIS WITH CONTRAST TECHNIQUE: Multidetector CT imaging of the abdomen and pelvis was performed using the standard protocol following bolus administration of intravenous  contrast. CONTRAST:  138mL ISOVUE-300 IOPAMIDOL (ISOVUE-300) INJECTION 61% COMPARISON:  None. FINDINGS: Lower chest: 3 mm triangular subpleural nodule in the left lower lobe (series 4/image 7). Hepatobiliary: Liver is within normal limits. Gallbladder is unremarkable. No intrahepatic or extrahepatic ductal dilatation. Pancreas: Within normal limits. Spleen: Within normal limits. Adrenals/Urinary Tract: Adrenal glands are within normal limits. 2.5 cm left lower pole renal cyst (series 2/image 39). Mild right hydroureteronephrosis. Right perinephric/periureteral fluid/stranding. Associated 3 mm distal right ureteral calculus just above the UVJ (coronal image 74; axial image 86). Additional 3-4 mm calcification is favored to be just posterior to the distal right ureter (series 2/image 84), favoring a calcified phlebolith, although an additional distal ureteral calculus is possible. Bladder is underdistended but unremarkable. Stomach/Bowel: Stomach is mildly distended but grossly unremarkable. No evidence of bowel obstruction. Normal appendix (series 2/image 56). Vascular/Lymphatic: No evidence of abdominal aortic aneurysm. No suspicious abdominopelvic lymphadenopathy. Reproductive: Prostate is unremarkable. Other: No abdominopelvic ascites. Musculoskeletal: Visualized osseous structures are within normal limits. IMPRESSION: 3 mm distal right ureteral calculus just above the UVJ. Associated mild right hydroureteronephrosis. 3 mm triangular subpleural nodule in the left lower lobe, likely reflecting a benign subpleural lymph node. No follow-up needed if patient is low-risk. Non-contrast chest CT can be considered in 12 months if patient is high-risk. This recommendation follows the consensus statement: Guidelines for Management of Incidental Pulmonary Nodules Detected on CT Images: From the Fleischner Society 2017; Radiology 2017; 284:228-243. Additional ancillary findings as above. Electronically Signed   By: Julian Hy M.D.   On: 04/12/2018 20:30    ____________________________________________   PROCEDURES  Procedure(s) performed:   Procedures  Crit  ____________________________________________   INITIAL IMPRESSION / ASSESSMENT AND PLAN / ED COURSE  Patient better after Toradol we will try to discharge him.  Clinical Course as of Apr 12 2117  Nancy Fetter Apr 12, 2018  2037 Bilirubin Urine: NEGATIVE [PM]    Clinical Course User Index [PM] Nena Polio, MD     ____________________________________________   FINAL CLINICAL IMPRESSION(S) / ED DIAGNOSES  Final diagnoses:  Syncope and collapse  Renal colic     ED Discharge Orders        Ordered    tamsulosin (FLOMAX) 0.4 MG CAPS capsule  Daily     04/12/18 2047    oxyCODONE-acetaminophen (PERCOCET) 5-325 MG tablet  Every 4 hours PRN  04/12/18 2047       Note:  This document was prepared using Dragon voice recognition software and may include unintentional dictation errors.    Nena Polio, MD 04/12/18 2119

## 2018-04-15 ENCOUNTER — Ambulatory Visit (INDEPENDENT_AMBULATORY_CARE_PROVIDER_SITE_OTHER): Payer: 59 | Admitting: Urology

## 2018-04-15 ENCOUNTER — Encounter: Payer: Self-pay | Admitting: Urology

## 2018-04-15 ENCOUNTER — Ambulatory Visit: Payer: 59 | Admitting: Urology

## 2018-04-15 ENCOUNTER — Other Ambulatory Visit: Payer: Self-pay | Admitting: Radiology

## 2018-04-15 VITALS — BP 144/97 | HR 121 | Ht 73.0 in | Wt 247.3 lb

## 2018-04-15 DIAGNOSIS — N201 Calculus of ureter: Secondary | ICD-10-CM | POA: Diagnosis not present

## 2018-04-15 DIAGNOSIS — N200 Calculus of kidney: Secondary | ICD-10-CM | POA: Diagnosis not present

## 2018-04-15 DIAGNOSIS — N23 Unspecified renal colic: Secondary | ICD-10-CM

## 2018-04-15 LAB — URINALYSIS, COMPLETE
Bilirubin, UA: POSITIVE — AB
Glucose, UA: NEGATIVE
Ketones, UA: NEGATIVE
LEUKOCYTES UA: NEGATIVE
Nitrite, UA: NEGATIVE
Protein, UA: NEGATIVE
SPEC GRAV UA: 1.015 (ref 1.005–1.030)
Urobilinogen, Ur: 0.2 mg/dL (ref 0.2–1.0)
pH, UA: 5 (ref 5.0–7.5)

## 2018-04-15 LAB — MICROSCOPIC EXAMINATION
BACTERIA UA: NONE SEEN
EPITHELIAL CELLS (NON RENAL): NONE SEEN /HPF (ref 0–10)

## 2018-04-15 MED ORDER — KETOROLAC TROMETHAMINE 15.75 MG/SPRAY NA SOLN: NASAL | 0 refills | Status: DC

## 2018-04-15 MED ORDER — OXYCODONE-ACETAMINOPHEN 5-325 MG PO TABS: 1.0000 | ORAL_TABLET | Freq: Four times a day (QID) | ORAL | 0 refills | Status: DC | PRN

## 2018-04-15 NOTE — Progress Notes (Signed)
04/15/2018 12:16 PM   [name] [birth year] [id number]  Referring provider: Olin Hauser, DO [address] [place], [place] [address]  Chief Complaint  Patient presents with  . Nephrolithiasis    HPI: 42 year old male presents for evaluation of renal colic.  He presented to the [facility] ED on 04/12/2018 complaining of worsening right lower quadrant abdominal pain.  He was having intermittent mild to moderate pain 1 week prior to his visit.  There were no identifiable precipitating factors.  His pain was worse with increased movement.  A stone protocol CT of the abdomen and pelvis was performed which showed a 3 mm right UVJ calculus.  He was discharged on tamsulosin and Percocet.  He has required regular narcotic dosing and his pain has been difficult to control at times.  He has had nausea with vomiting.  Denies fever or chills.  There is no previous history of stone disease.   PMH: Past Medical History:  Diagnosis Date  . Chest pain   . Depression   . Diabetes ([place])   . Family history of adverse reaction to anesthesia    MOM-N/V, DAD-HAS PROBLEMS WITH NOVICAINE  . Food allergy   . GERD (gastroesophageal reflux disease)   . History of hiatal hernia   . History of kidney stones    H/O  . Hypertension   . Insomnia   . Joint pain   . Plantar fasciitis   . Seasonal allergies   . Tendonitis of foot    left    Surgical History: Past Surgical History:  Procedure Laterality Date  . CARPAL TUNNEL RELEASE Right 07/03/2017   Procedure: CARPAL TUNNEL RELEASE;  Surgeon: Hessie Knows, MD;  Location: [facility] ORS;  Service: Orthopedics;  Laterality: Right;  . clavide surgery  2005  . KNEE SURGERY Right 2013  . SHOULDER SURGERY Right 2008    Home Medications:  Allergies as of 04/15/2018      Reactions   Tomato Other (See Comments)   GI distress      Medication List        Accurate as of 04/15/18 12:16 PM. Always use your most recent med list.          buPROPion 150  MG 24 hr tablet Commonly known as:  WELLBUTRIN XL   esomeprazole 40 MG capsule Commonly known as:  NEXIUM Take 1 capsule (40 mg total) every morning by mouth.   etodolac 500 MG tablet Commonly known as:  LODINE TAKE 1 TABLET (500 MG TOTAL) BY MOUTH 2 (TWO) TIMES DAILY.   Ketorolac Tromethamine 15.75 MG/SPRAY Soln Commonly known as:  SPRIX 1 spray each nostril q 6-8 hours prn pain   metFORMIN 1000 MG tablet Commonly known as:  GLUCOPHAGE Take 1 tablet (1,000 mg total) by mouth 2 (two) times daily with a meal.   multivitamin capsule Take 1 capsule by mouth daily.   oxyCODONE-acetaminophen 5-325 MG tablet Commonly known as:  PERCOCET Take 1 tablet by mouth every 4 (four) hours as needed for severe pain.   tamsulosin 0.4 MG Caps capsule Commonly known as:  FLOMAX Take 1 capsule (0.4 mg total) by mouth daily.   valsartan-hydrochlorothiazide 160-12.5 MG tablet Commonly known as:  DIOVAN-HCT Take 1 tablet by mouth every morning.   Vitamin D (Ergocalciferol) 50000 units Caps capsule Commonly known as:  DRISDOL Take 1 capsule (50,000 Units total) by mouth every 7 (seven) days.       Allergies:  Allergies  Allergen Reactions  . Tomato  Other (See Comments)    GI distress    Family History: Family History  Problem Relation Age of Onset  . Cancer Mother        colon/rectal  . Hyperlipidemia Mother   . Cancer Brother        hodgekins  . Cancer Maternal Grandfather        lung  . Stroke Maternal Grandfather   . Diabetes Father   . Hypertension Father   . Hyperlipidemia Father   . Heart disease Father   . Alcoholism Father     Social History:  reports that he has never smoked. He has never used smokeless tobacco. He reports that he drinks alcohol. He reports that he does not use drugs.  ROS: UROLOGY Frequent Urination?: No Hard to postpone urination?: No Burning/pain with urination?: No Get up at night to urinate?: No Leakage of urine?: No Urine stream  starts and stops?: No Trouble starting stream?: No Do you have to strain to urinate?: No Blood in urine?: No Urinary tract infection?: No Sexually transmitted disease?: No Injury to kidneys or bladder?: No Painful intercourse?: No Weak stream?: No Erection problems?: No Penile pain?: No  Gastrointestinal Nausea?: No Vomiting?: No Indigestion/heartburn?: No Diarrhea?: No Constipation?: No  Constitutional Fever: No Night sweats?: No Weight loss?: No Fatigue?: No  Skin Skin rash/lesions?: No Itching?: No  Eyes Blurred vision?: No Double vision?: No  Ears/Nose/Throat Sore throat?: No Sinus problems?: No  Hematologic/Lymphatic Swollen glands?: No Easy bruising?: No  Cardiovascular Leg swelling?: No Chest pain?: No  Respiratory Cough?: No Shortness of breath?: No  Endocrine Excessive thirst?: No  Musculoskeletal Back pain?: No Joint pain?: No  Neurological Headaches?: No Dizziness?: No  Psychologic Depression?: No Anxiety?: No  Physical Exam: BP (!) 144/97 (BP Location: Left Arm, Patient Position: Sitting, Cuff Size: Large)   Pulse (!) 121   Ht 6' 1"  (1.854 m)   Wt 247 lb 4.8 oz (112.2 kg)   BMI 32.63 kg/m   Constitutional:  Alert and oriented, No acute distress. HEENT: [place] AT, moist mucus membranes.  Trachea midline, no masses. Cardiovascular: No clubbing, cyanosis, or edema.  RRR Respiratory: Normal respiratory effort, no increased work of breathing.  Lungs clear GI: Abdomen is soft, nontender, nondistended, no abdominal masses GU: No CVA tenderness Lymph: No cervical or inguinal lymphadenopathy. Skin: No rashes, bruises or suspicious lesions. Neurologic: Grossly intact, no focal deficits, moving all 4 extremities. Psychiatric: Normal mood and affect.   Pertinent Imaging: CT was personally reviewed.  Questionable calculus just proximal to the UVJ stone.  Assessment & Plan:   42 year old male with right renal colic secondary to a distal  ureteral calculus.  He was informed based on stone size and location there is a greater than 95% chance that it will pass spontaneously.  The stone would be difficult to treat with shockwave lithotripsy and the most successful procedure would be ureteroscopic removal.  We will proceed with a brief trial of passage/MET and will tentatively schedule for ureteroscopy early next week if he is unable to pass.  Oxycodone was refilled.  Rx for intranasal ketorolac was also sent to see if this provides better pain control.     Abbie Sons, [place] [address], [place], [place] [address] [phone]

## 2018-04-16 ENCOUNTER — Other Ambulatory Visit: Payer: Self-pay | Admitting: Urology

## 2018-04-16 MED ORDER — ONDANSETRON 8 MG PO TBDP: 8.0000 mg | ORAL_TABLET | Freq: Three times a day (TID) | ORAL | 0 refills | Status: DC | PRN

## 2018-04-17 ENCOUNTER — Encounter: Payer: Self-pay | Admitting: Urology

## 2018-04-17 DIAGNOSIS — N201 Calculus of ureter: Secondary | ICD-10-CM | POA: Insufficient documentation

## 2018-04-17 DIAGNOSIS — N23 Unspecified renal colic: Secondary | ICD-10-CM | POA: Insufficient documentation

## 2018-04-18 LAB — CULTURE, URINE COMPREHENSIVE

## 2018-04-20 ENCOUNTER — Ambulatory Visit
Admission: RE | Admit: 2018-04-20 | Discharge: 2018-04-20 | Disposition: A | Discharge status: Home / Self Care | Payer: 59 | Source: Ambulatory Visit | Attending: Urology | Admitting: Urology

## 2018-04-20 ENCOUNTER — Telehealth: Payer: Self-pay | Admitting: Radiology

## 2018-04-20 ENCOUNTER — Other Ambulatory Visit: Payer: Self-pay | Admitting: Radiology

## 2018-04-20 DIAGNOSIS — N200 Calculus of kidney: Secondary | ICD-10-CM

## 2018-04-20 DIAGNOSIS — N202 Calculus of kidney with calculus of ureter: Secondary | ICD-10-CM | POA: Diagnosis not present

## 2018-04-20 NOTE — Telephone Encounter (Signed)
Patient thinks stone may have passed. KUB ordered per Dr Bernardo Heater. Dr Bernardo Heater reviewed images & said stone may be still present but it is hard to say & recommends giving him more time & see how he feels, repeating CT or proceeding with ureteroscopy scheduled 04/21/2018 to be sure. Patient chose watchful waiting. Surgery has been cancelled.

## 2018-04-21 ENCOUNTER — Ambulatory Visit: Admission: RE | Admit: 2018-04-21 | Payer: 59 | Source: Ambulatory Visit | Admitting: Urology

## 2018-04-21 ENCOUNTER — Encounter: Admission: RE | Payer: Self-pay | Source: Ambulatory Visit

## 2018-04-21 SURGERY — CYSTOSCOPY/URETEROSCOPY/HOLMIUM LASER/STENT PLACEMENT
Anesthesia: Choice | Laterality: Right

## 2018-04-22 DIAGNOSIS — Z872 Personal history of diseases of the skin and subcutaneous tissue: Secondary | ICD-10-CM | POA: Diagnosis not present

## 2018-04-22 DIAGNOSIS — L578 Other skin changes due to chronic exposure to nonionizing radiation: Secondary | ICD-10-CM | POA: Diagnosis not present

## 2018-04-23 ENCOUNTER — Ambulatory Visit (INDEPENDENT_AMBULATORY_CARE_PROVIDER_SITE_OTHER): Payer: 59 | Admitting: Family Medicine

## 2018-04-23 VITALS — BP 128/84 | HR 83 | Temp 98.1°F | Ht 73.0 in | Wt 237.0 lb

## 2018-04-23 DIAGNOSIS — Z6831 Body mass index (BMI) 31.0-31.9, adult: Secondary | ICD-10-CM

## 2018-04-23 DIAGNOSIS — E669 Obesity, unspecified: Secondary | ICD-10-CM | POA: Diagnosis not present

## 2018-04-23 DIAGNOSIS — E559 Vitamin D deficiency, unspecified: Secondary | ICD-10-CM | POA: Diagnosis not present

## 2018-04-23 DIAGNOSIS — E119 Type 2 diabetes mellitus without complications: Secondary | ICD-10-CM

## 2018-04-23 DIAGNOSIS — Z9189 Other specified personal risk factors, not elsewhere classified: Secondary | ICD-10-CM | POA: Diagnosis not present

## 2018-04-23 MED ORDER — VITAMIN D (ERGOCALCIFEROL) 1.25 MG (50000 UNIT) PO CAPS: 50000.0000 [IU] | ORAL_CAPSULE | ORAL | 0 refills | Status: DC

## 2018-04-27 NOTE — Progress Notes (Signed)
Office: [phone]  /  Fax: [phone]   HPI:   Chief Complaint: OBESITY [name] is here to discuss his progress with his obesity treatment plan. He is on the Category 4 plan and is following his eating plan approximately 80 % of the time. He states he is walking 30 to 40 minutes 3 to 4 times per week. [name] continues to do well with weight loss. He has had a kidney stone recently and has increased water intake. He is tolerating the category 4 plan well and hunger is controlled. [name] is walking for exercise, but he wants to restart crossfit when his foot fully heals.  His weight is 237 lb (107.5 kg) today and has had a weight loss of 2 pounds over a period of 3 weeks since his last visit. He has lost 12 lbs since starting treatment with [name].  Diabetes II [name] has a diagnosis of diabetes type II. [place] states fasting BGs range between 90 and 120 and he denies any hypoglycemic episodes. Last A1c was at 6.2. He is on metformin 1,000 mg twice daily and is doing well. [place] is not checking his post prandial blood sugar. Polyphagia is controlled. He has been working on intensive lifestyle modifications including diet, exercise, and weight loss to help control his blood glucose levels.  At risk for cardiovascular disease [name] is at a higher than average risk for cardiovascular disease due to obesity and diabetes. He currently denies any chest pain.  Vitamin D deficiency [name] has a diagnosis of vitamin D deficiency. His last level was not at goal. He is currently taking vit D and denies nausea, vomiting or muscle weakness.  ALLERGIES: Allergies  Allergen Reactions  . Tomato Other (See Comments)    GI distress    MEDICATIONS: Current Outpatient Medications on File Prior to Visit  Medication Sig Dispense Refill  . buPROPion (WELLBUTRIN XL) 150 MG 24 hr tablet Take 150 mg by mouth daily.   12  . esomeprazole (NEXIUM) 40 MG capsule Take 1 capsule (40 mg total) every morning by  mouth. 90 capsule 3  . etodolac (LODINE) 500 MG tablet TAKE 1 TABLET (500 MG TOTAL) BY MOUTH 2 (TWO) TIMES DAILY. (Patient taking differently: Take 500 mg by mouth 2 (two) times daily as needed (pain). ) 60 tablet 2  . Ketorolac Tromethamine (SPRIX) 15.75 MG/SPRAY SOLN 1 spray each nostril q 6-8 hours prn pain 5 each 0  . metFORMIN (GLUCOPHAGE) 1000 MG tablet Take 1 tablet (1,000 mg total) by mouth 2 (two) times daily with a meal. 60 tablet 0  . Multiple Vitamin (MULTIVITAMIN) capsule Take 1 capsule by mouth daily.    . ondansetron (ZOFRAN-ODT) 8 MG disintegrating tablet Take 1 tablet (8 mg total) by mouth every 8 (eight) hours as needed for nausea or vomiting. 20 tablet 0  . oxyCODONE-acetaminophen (PERCOCET) 5-325 MG tablet Take 1-2 tablets by mouth every 6 (six) hours as needed for severe pain. 30 tablet 0  . tamsulosin (FLOMAX) 0.4 MG CAPS capsule Take 1 capsule (0.4 mg total) by mouth daily. 10 capsule 0  . valsartan-hydrochlorothiazide (DIOVAN-HCT) 160-12.5 MG tablet Take 1 tablet by mouth every morning. 90 tablet 3   No current facility-administered medications on file prior to visit.     PAST MEDICAL HISTORY: Past Medical History:  Diagnosis Date  . Chest pain   . Depression   . Diabetes ([place])   . Family history of adverse reaction to anesthesia    MOM-N/V, DAD-HAS PROBLEMS WITH NOVICAINE  .  Food allergy   . GERD (gastroesophageal reflux disease)   . History of hiatal hernia   . History of kidney stones    H/O  . Hypertension   . Insomnia   . Joint pain   . Plantar fasciitis   . Seasonal allergies   . Tendonitis of foot    left    PAST SURGICAL HISTORY: Past Surgical History:  Procedure Laterality Date  . CARPAL TUNNEL RELEASE Right 07/03/2017   Procedure: CARPAL TUNNEL RELEASE;  Surgeon: Hessie Knows, MD;  Location: [facility] ORS;  Service: Orthopedics;  Laterality: Right;  . clavide surgery  2005  . KNEE SURGERY Right 2013  . SHOULDER SURGERY Right 2008    SOCIAL  HISTORY: Social History   Tobacco Use  . Smoking status: Never Smoker  . Smokeless tobacco: Never Used  Substance Use Topics  . Alcohol use: Yes    Alcohol/week: 0.0 standard drinks    Comment: RARE  . Drug use: No    FAMILY HISTORY: Family History  Problem Relation Age of Onset  . Cancer Mother        colon/rectal  . Hyperlipidemia Mother   . Cancer Brother        hodgekins  . Cancer Maternal Grandfather        lung  . Stroke Maternal Grandfather   . Diabetes Father   . Hypertension Father   . Hyperlipidemia Father   . Heart disease Father   . Alcoholism Father     ROS: Review of Systems  Constitutional: Positive for weight loss.  Cardiovascular: Negative for chest pain.  Gastrointestinal: Negative for nausea and vomiting.  Musculoskeletal:       Negative for muscle weakness  Endo/Heme/Allergies:       Positive for polyphagia Negative for hypoglycemia    PHYSICAL EXAM: Blood pressure 128/84, pulse 83, temperature 98.1 F (36.7 C), temperature source Oral, height 6\' 1"  (1.854 m), weight 237 lb (107.5 kg), SpO2 100 %. Body mass index is 31.27 kg/m. Physical Exam  Constitutional: He is oriented to person, place, and time. He appears well-developed and well-nourished.  Cardiovascular: Normal rate.  Pulmonary/Chest: Effort normal.  Musculoskeletal: Normal range of motion.  Neurological: He is oriented to person, place, and time.  Skin: Skin is warm and dry.  Psychiatric: He has a normal mood and affect. His behavior is normal.  Vitals reviewed.   RECENT LABS AND TESTS: BMET    Component Value Date/Time   NA 140 04/12/2018 1820   NA 138 01/28/2018 1007   NA 138 05/20/2013 0815   K 3.9 04/12/2018 1820   K 3.7 05/20/2013 0815   CL 107 04/12/2018 1820   CL 106 05/20/2013 0815   CO2 22 04/12/2018 1820   CO2 26 05/20/2013 0815   GLUCOSE 151 (H) 04/12/2018 1820   GLUCOSE 101 (H) 05/20/2013 0815   BUN 18 04/12/2018 1820   BUN 18 01/28/2018 1007   BUN 20  (H) 05/20/2013 0815   CREATININE 1.07 04/12/2018 1820   CREATININE 0.99 05/20/2013 0815   CALCIUM 9.6 04/12/2018 1820   CALCIUM 8.9 05/20/2013 0815   GFRNONAA >60 04/12/2018 1820   GFRNONAA >60 05/20/2013 0815   GFRAA >60 04/12/2018 1820   GFRAA >60 05/20/2013 0815   Lab Results  Component Value Date   HGBA1C 6.2 (H) 01/28/2018   HGBA1C 6.7 11/07/2017   HGBA1C 6.3 05/08/2017   HGBA1C 6.5 11/06/2016   HGBA1C 10.6 (H) 11/06/2011   Lab Results  Component Value Date  INSULIN 10.5 01/28/2018   CBC    Component Value Date/Time   WBC 11.1 (H) 04/12/2018 1820   RBC 5.31 04/12/2018 1820   HGB 15.5 04/12/2018 1820   HGB 14.6 01/28/2018 1007   HCT 44.9 04/12/2018 1820   HCT 43.5 01/28/2018 1007   PLT 324 04/12/2018 1820   PLT 250 06/23/2017 1104   MCV 84.4 04/12/2018 1820   MCV 84 01/28/2018 1007   MCV 84 11/05/2011 1606   MCH 29.2 04/12/2018 1820   MCHC 34.6 04/12/2018 1820   RDW 13.5 04/12/2018 1820   RDW 14.3 01/28/2018 1007   RDW 13.0 11/05/2011 1606   LYMPHSABS 2.5 04/12/2018 1820   LYMPHSABS 1.5 01/28/2018 1007   LYMPHSABS 1.8 11/05/2011 1606   MONOABS 0.9 04/12/2018 1820   MONOABS 0.6 11/05/2011 1606   EOSABS 0.5 04/12/2018 1820   EOSABS 0.2 01/28/2018 1007   EOSABS 0.1 11/05/2011 1606   BASOSABS 0.1 04/12/2018 1820   BASOSABS 0.0 01/28/2018 1007   BASOSABS 0.0 11/05/2011 1606   Iron/TIBC/Ferritin/ %Sat No results found for: IRON, TIBC, FERRITIN, IRONPCTSAT Lipid Panel     Component Value Date/Time   CHOL 185 01/28/2018 1007   TRIG 109 01/28/2018 1007   HDL 63 01/28/2018 1007   CHOLHDL 3.5 06/23/2017 1104   LDLCALC 100 (H) 01/28/2018 1007   Hepatic Function Panel     Component Value Date/Time   PROT 7.8 04/12/2018 1820   PROT 7.0 01/28/2018 1007   PROT 8.5 (H) 11/05/2011 1606   ALBUMIN 4.6 04/12/2018 1820   ALBUMIN 4.4 01/28/2018 1007   ALBUMIN 4.5 11/05/2011 1606   AST 22 04/12/2018 1820   AST 36 11/05/2011 1606   ALT 19 04/12/2018 1820   ALT  53 11/05/2011 1606   ALKPHOS 63 04/12/2018 1820   ALKPHOS 147 (H) 11/05/2011 1606   BILITOT 0.7 04/12/2018 1820   BILITOT 0.3 01/28/2018 1007   BILITOT 0.5 11/05/2011 1606      Component Value Date/Time   TSH 1.330 01/28/2018 1007   TSH 1.440 01/04/2016 0818   Results for Gehlhausen, Jon C "NED" (MRN [id number]) as of 04/27/2018 12:49  Ref. Range 01/28/2018 10:07  Vitamin D, 25-Hydroxy Latest Ref Range: 30.0 - 100.0 ng/mL 27.0 (L)   ASSESSMENT AND PLAN: Type 2 diabetes mellitus without complication, without long-term current use of insulin (HCC)  Vitamin D deficiency - Plan: Vitamin D, Ergocalciferol, (DRISDOL) 50000 units CAPS capsule  At risk for heart disease  Class 1 obesity with serious comorbidity and body mass index (BMI) of 31.0 to 31.9 in adult, unspecified obesity type  PLAN:  Diabetes II [name] has been given extensive diabetes education by myself today including ideal fasting and post-prandial blood glucose readings, individual ideal Hgb A1c goals and hypoglycemia prevention. We discussed the importance of good blood sugar control to decrease the likelihood of diabetic complications such as nephropathy, neuropathy, limb loss, blindness, coronary artery disease, and death. We discussed the importance of intensive lifestyle modification including diet, exercise and weight loss as the first line treatment for diabetes. [name] agrees to continue metformin and diet. [name] agrees to follow up at the agreed upon time.  Cardiovascular risk counseling Whitten was given extended (15 minutes) coronary artery disease prevention counseling today. He is 42 y.o. male and has risk factors for heart disease including obesity and diabetes. We discussed intensive lifestyle modifications today with an emphasis on specific weight loss instructions and strategies. Pt was also informed of the importance of increasing exercise and decreasing  saturated fats to help prevent heart disease.  Vitamin D  Deficiency [name] was informed that low vitamin D levels contributes to fatigue and are associated with obesity, breast, and colon cancer. He agrees to continue to take prescription Vit D @50 ,000 IU every week #4 with no refills. We will check labs at the next visit and will follow up for routine testing of vitamin D, at least 2-3 times per year. He was informed of the risk of over-replacement of vitamin D and agrees to not increase his dose unless he discusses this with [name] first. [name] agrees to follow up as directed.  Obesity [place] is currently in the action stage of change. As such, his goal is to continue with weight loss efforts He has agreed to follow the Category 4 plan [name] has been instructed to work up to a goal of 150 minutes of combined cardio and strengthening exercise per week for weight loss and overall health benefits. We discussed the following Behavioral Modification Strategies today: increase H2O intake, better snacking choices and work on meal planning and easy cooking plans  [name] has agreed to follow up with our clinic in 2 to 3 weeks fasting. He was informed of the importance of frequent follow up visits to maximize his success with intensive lifestyle modifications for his multiple health conditions.   OBESITY BEHAVIORAL INTERVENTION VISIT  Today's visit was # 5 out of 22.  Starting weight: 249 lbs Starting date: 01/28/18 Today's weight : 237 lbs Today's date: 04/23/2018 Total lbs lost to date: 12    ASK: We discussed the diagnosis of obesity with [name] today and [name] agreed to give [name] permission to discuss obesity behavioral modification therapy today.  ASSESS: [name] has the diagnosis of obesity and his BMI today is 31.27 [name] is in the action stage of change   ADVISE: [name] was educated on the multiple health risks of obesity as well as the benefit of weight loss to improve his health. He was advised of the need for long term treatment and  the importance of lifestyle modifications.  AGREE: Multiple dietary modification options and treatment options were discussed and  [name] agreed to the above obesity treatment plan.  I, Doreene Nest, am acting as transcriptionist for Dennard Nip, MD  I have reviewed the above documentation for accuracy and completeness, and I agree with the above. -Dennard Nip, MD

## 2018-05-04 ENCOUNTER — Other Ambulatory Visit (INDEPENDENT_AMBULATORY_CARE_PROVIDER_SITE_OTHER): Payer: Self-pay | Admitting: Family Medicine

## 2018-05-04 DIAGNOSIS — E119 Type 2 diabetes mellitus without complications: Secondary | ICD-10-CM

## 2018-05-12 ENCOUNTER — Encounter (INDEPENDENT_AMBULATORY_CARE_PROVIDER_SITE_OTHER): Payer: Self-pay

## 2018-05-12 ENCOUNTER — Ambulatory Visit (INDEPENDENT_AMBULATORY_CARE_PROVIDER_SITE_OTHER): Payer: Self-pay | Admitting: Family Medicine

## 2018-05-12 DIAGNOSIS — E119 Type 2 diabetes mellitus without complications: Secondary | ICD-10-CM | POA: Diagnosis not present

## 2018-05-12 DIAGNOSIS — I10 Essential (primary) hypertension: Secondary | ICD-10-CM | POA: Diagnosis not present

## 2018-05-13 ENCOUNTER — Ambulatory Visit (INDEPENDENT_AMBULATORY_CARE_PROVIDER_SITE_OTHER): Payer: 59 | Admitting: Physician Assistant

## 2018-05-13 ENCOUNTER — Encounter (INDEPENDENT_AMBULATORY_CARE_PROVIDER_SITE_OTHER): Payer: Self-pay | Admitting: Physician Assistant

## 2018-05-13 VITALS — BP 118/80 | HR 76 | Temp 98.1°F | Ht 73.0 in | Wt 236.0 lb

## 2018-05-13 DIAGNOSIS — E7849 Other hyperlipidemia: Secondary | ICD-10-CM | POA: Diagnosis not present

## 2018-05-13 DIAGNOSIS — Z9189 Other specified personal risk factors, not elsewhere classified: Secondary | ICD-10-CM | POA: Diagnosis not present

## 2018-05-13 DIAGNOSIS — E119 Type 2 diabetes mellitus without complications: Secondary | ICD-10-CM

## 2018-05-13 DIAGNOSIS — E559 Vitamin D deficiency, unspecified: Secondary | ICD-10-CM

## 2018-05-13 DIAGNOSIS — Z6831 Body mass index (BMI) 31.0-31.9, adult: Secondary | ICD-10-CM

## 2018-05-13 DIAGNOSIS — E669 Obesity, unspecified: Secondary | ICD-10-CM

## 2018-05-14 LAB — VITAMIN D 25 HYDROXY (VIT D DEFICIENCY, FRACTURES): VIT D 25 HYDROXY: 37.8 ng/mL (ref 30.0–100.0)

## 2018-05-14 LAB — LIPID PANEL WITH LDL/HDL RATIO
CHOLESTEROL TOTAL: 175 mg/dL (ref 100–199)
HDL: 51 mg/dL (ref >39)
LDL CALC: 103 mg/dL — AB (ref 0–99)
LDl/HDL Ratio: 2 ratio (ref 0.0–3.6)
Triglycerides: 106 mg/dL (ref 0–149)
VLDL Cholesterol Cal: 21 mg/dL (ref 5–40)

## 2018-05-14 LAB — INSULIN, RANDOM: INSULIN: 11.6 u[IU]/mL (ref 2.6–24.9)

## 2018-05-14 NOTE — Progress Notes (Signed)
Office: [phone]  /  Fax: [phone]   HPI:   Chief Complaint: OBESITY [name] is here to discuss his progress with his obesity treatment plan. He is on the Category 1 plan and is following his eating plan approximately 60 % of the time. He states he is walking 40 minutes 4 times per week. [name] did well with weight loss. He reports splurging this weekend going [place] racetrack. His weight is 236 lb (107 kg) today and has had a weight loss of 1 pounds over a period of 3 weeks since his last visit. He has lost 13 lbs since starting treatment with [name].  Diabetes II [name] has a diagnosis of diabetes type II. [name] notes afternoon hunger and he is taking metformin at breakfast and dinner currently. Last A1c was at 6.2 He has been working on intensive lifestyle modifications including diet, exercise, and weight loss to help control his blood glucose levels. [name] denies nausea, vomiting or diarrhea.  Hyperlipidemia [name] has hyperlipidemia and is not on medications currently. He has been trying to improve his cholesterol levels with intensive lifestyle modification including a low saturated fat diet, exercise and weight loss. He denies any chest pain.  Vitamin D deficiency [name] has a diagnosis of vitamin D deficiency. He is currently taking vit D and denies nausea, vomiting or muscle weakness.  At risk for cardiovascular disease [name] is at a higher than average risk for cardiovascular disease due to obesity and hyperlipoidemia. He currently denies any chest pain.  ALLERGIES: Allergies  Allergen Reactions  . Tomato Other (See Comments)    GI distress    MEDICATIONS: Current Outpatient Medications on File Prior to Visit  Medication Sig Dispense Refill  . buPROPion (WELLBUTRIN XL) 150 MG 24 hr tablet Take 150 mg by mouth daily.   12  . esomeprazole (NEXIUM) 40 MG capsule Take 1 capsule (40 mg total) every morning by mouth. 90 capsule 3  . etodolac (LODINE) 500 MG tablet  TAKE 1 TABLET (500 MG TOTAL) BY MOUTH 2 (TWO) TIMES DAILY. (Patient taking differently: Take 500 mg by mouth 2 (two) times daily as needed (pain). ) 60 tablet 2  . Ketorolac Tromethamine (SPRIX) 15.75 MG/SPRAY SOLN 1 spray each nostril q 6-8 hours prn pain 5 each 0  . metFORMIN (GLUCOPHAGE) 1000 MG tablet TAKE 1 TABLET BY MOUTH 2 TIMES DAILY WITH A MEAL. 60 tablet 0  . Multiple Vitamin (MULTIVITAMIN) capsule Take 1 capsule by mouth daily.    . ondansetron (ZOFRAN-ODT) 8 MG disintegrating tablet Take 1 tablet (8 mg total) by mouth every 8 (eight) hours as needed for nausea or vomiting. 20 tablet 0  . oxyCODONE-acetaminophen (PERCOCET) 5-325 MG tablet Take 1-2 tablets by mouth every 6 (six) hours as needed for severe pain. 30 tablet 0  . tamsulosin (FLOMAX) 0.4 MG CAPS capsule Take 1 capsule (0.4 mg total) by mouth daily. 10 capsule 0  . valsartan-hydrochlorothiazide (DIOVAN-HCT) 160-12.5 MG tablet Take 1 tablet by mouth every morning. 90 tablet 3  . Vitamin D, Ergocalciferol, (DRISDOL) 50000 units CAPS capsule Take 1 capsule (50,000 Units total) by mouth every 7 (seven) days. 4 capsule 0   No current facility-administered medications on file prior to visit.     PAST MEDICAL HISTORY: Past Medical History:  Diagnosis Date  . Chest pain   . Depression   . Diabetes ([place])   . Family history of adverse reaction to anesthesia    MOM-N/V, DAD-HAS PROBLEMS WITH NOVICAINE  . Food allergy   .  GERD (gastroesophageal reflux disease)   . History of hiatal hernia   . History of kidney stones    H/O  . Hypertension   . Insomnia   . Joint pain   . Plantar fasciitis   . Seasonal allergies   . Tendonitis of foot    left    PAST SURGICAL HISTORY: Past Surgical History:  Procedure Laterality Date  . CARPAL TUNNEL RELEASE Right 07/03/2017   Procedure: CARPAL TUNNEL RELEASE;  Surgeon: Hessie Knows, MD;  Location: [facility] ORS;  Service: Orthopedics;  Laterality: Right;  . clavide surgery  2005  . KNEE  SURGERY Right 2013  . SHOULDER SURGERY Right 2008    SOCIAL HISTORY: Social History   Tobacco Use  . Smoking status: Never Smoker  . Smokeless tobacco: Never Used  Substance Use Topics  . Alcohol use: Yes    Alcohol/week: 0.0 standard drinks    Comment: RARE  . Drug use: No    FAMILY HISTORY: Family History  Problem Relation Age of Onset  . Cancer Mother        colon/rectal  . Hyperlipidemia Mother   . Cancer Brother        hodgekins  . Cancer Maternal Grandfather        lung  . Stroke Maternal Grandfather   . Diabetes Father   . Hypertension Father   . Hyperlipidemia Father   . Heart disease Father   . Alcoholism Father     ROS: Review of Systems  Constitutional: Positive for weight loss.  Cardiovascular: Negative for chest pain.  Gastrointestinal: Negative for diarrhea, nausea and vomiting.  Musculoskeletal:       Negative for muscle weakness    PHYSICAL EXAM: Blood pressure 118/80, pulse 76, temperature 98.1 F (36.7 C), temperature source Oral, height 6\' 1"  (1.854 m), weight 236 lb (107 kg), SpO2 98 %. Body mass index is 31.14 kg/m. Physical Exam  Constitutional: He is oriented to person, place, and time. He appears well-developed and well-nourished.  Cardiovascular: Normal rate.  Pulmonary/Chest: Effort normal.  Musculoskeletal: Normal range of motion.  Neurological: He is oriented to person, place, and time.  Skin: Skin is warm and dry.  Psychiatric: He has a normal mood and affect. His behavior is normal.  Vitals reviewed.   RECENT LABS AND TESTS: BMET    Component Value Date/Time   NA 140 04/12/2018 1820   NA 138 01/28/2018 1007   NA 138 05/20/2013 0815   K 3.9 04/12/2018 1820   K 3.7 05/20/2013 0815   CL 107 04/12/2018 1820   CL 106 05/20/2013 0815   CO2 22 04/12/2018 1820   CO2 26 05/20/2013 0815   GLUCOSE 151 (H) 04/12/2018 1820   GLUCOSE 101 (H) 05/20/2013 0815   BUN 18 04/12/2018 1820   BUN 18 01/28/2018 1007   BUN 20 (H)  05/20/2013 0815   CREATININE 1.07 04/12/2018 1820   CREATININE 0.99 05/20/2013 0815   CALCIUM 9.6 04/12/2018 1820   CALCIUM 8.9 05/20/2013 0815   GFRNONAA >60 04/12/2018 1820   GFRNONAA >60 05/20/2013 0815   GFRAA >60 04/12/2018 1820   GFRAA >60 05/20/2013 0815   Lab Results  Component Value Date   HGBA1C 6.2 (H) 01/28/2018   HGBA1C 6.7 11/07/2017   HGBA1C 6.3 05/08/2017   HGBA1C 6.5 11/06/2016   HGBA1C 10.6 (H) 11/06/2011   Lab Results  Component Value Date   INSULIN 11.6 05/13/2018   INSULIN 10.5 01/28/2018   CBC    Component Value  Date/Time   WBC 11.1 (H) 04/12/2018 1820   RBC 5.31 04/12/2018 1820   HGB 15.5 04/12/2018 1820   HGB 14.6 01/28/2018 1007   HCT 44.9 04/12/2018 1820   HCT 43.5 01/28/2018 1007   PLT 324 04/12/2018 1820   PLT 250 06/23/2017 1104   MCV 84.4 04/12/2018 1820   MCV 84 01/28/2018 1007   MCV 84 11/05/2011 1606   MCH 29.2 04/12/2018 1820   MCHC 34.6 04/12/2018 1820   RDW 13.5 04/12/2018 1820   RDW 14.3 01/28/2018 1007   RDW 13.0 11/05/2011 1606   LYMPHSABS 2.5 04/12/2018 1820   LYMPHSABS 1.5 01/28/2018 1007   LYMPHSABS 1.8 11/05/2011 1606   MONOABS 0.9 04/12/2018 1820   MONOABS 0.6 11/05/2011 1606   EOSABS 0.5 04/12/2018 1820   EOSABS 0.2 01/28/2018 1007   EOSABS 0.1 11/05/2011 1606   BASOSABS 0.1 04/12/2018 1820   BASOSABS 0.0 01/28/2018 1007   BASOSABS 0.0 11/05/2011 1606   Iron/TIBC/Ferritin/ %Sat No results found for: IRON, TIBC, FERRITIN, IRONPCTSAT Lipid Panel     Component Value Date/Time   CHOL 175 05/13/2018 0845   TRIG 106 05/13/2018 0845   HDL 51 05/13/2018 0845   CHOLHDL 3.5 06/23/2017 1104   LDLCALC 103 (H) 05/13/2018 0845   Hepatic Function Panel     Component Value Date/Time   PROT 7.8 04/12/2018 1820   PROT 7.0 01/28/2018 1007   PROT 8.5 (H) 11/05/2011 1606   ALBUMIN 4.6 04/12/2018 1820   ALBUMIN 4.4 01/28/2018 1007   ALBUMIN 4.5 11/05/2011 1606   AST 22 04/12/2018 1820   AST 36 11/05/2011 1606   ALT 19  04/12/2018 1820   ALT 53 11/05/2011 1606   ALKPHOS 63 04/12/2018 1820   ALKPHOS 147 (H) 11/05/2011 1606   BILITOT 0.7 04/12/2018 1820   BILITOT 0.3 01/28/2018 1007   BILITOT 0.5 11/05/2011 1606      Component Value Date/Time   TSH 1.330 01/28/2018 1007   TSH 1.440 01/04/2016 0818   Results for Muellner, Clemence C "NED" (MRN [id number]) as of 05/14/2018 08:42  Ref. Range 05/13/2018 08:45  Vitamin D, 25-Hydroxy Latest Ref Range: 30.0 - 100.0 ng/mL 37.8   ASSESSMENT AND PLAN: Type 2 diabetes mellitus without complication, without long-term current use of insulin (HCC) - Plan: Insulin, random  Other hyperlipidemia - Plan: Lipid Panel With LDL/HDL Ratio  Vitamin D deficiency - Plan: VITAMIN D 25 Hydroxy (Vit-D Deficiency, Fractures)  At risk for heart disease  Class 1 obesity with serious comorbidity and body mass index (BMI) of 31.0 to 31.9 in adult, unspecified obesity type  PLAN:  Diabetes II Krishawn has been given extensive diabetes education by myself today including ideal fasting and post-prandial blood glucose readings, individual ideal Hgb A1c goals and hypoglycemia prevention. We discussed the importance of good blood sugar control to decrease the likelihood of diabetic complications such as nephropathy, neuropathy, limb loss, blindness, coronary artery disease, and death. We discussed the importance of intensive lifestyle modification including diet, exercise and weight loss as the first line treatment for diabetes. Arrion agrees to take his metformin in the morning and at lunch. We will check insulin level (Hgb A1c drawn yesterday) and [name] agrees to follow up at the agreed upon time.  Hyperlipidemia [name] was informed of the American Heart Association Guidelines emphasizing intensive lifestyle modifications as the first line treatment for hyperlipidemia. We discussed many lifestyle modifications today in depth, and [name] will continue to work on decreasing saturated fats such as  fatty  red meat, butter and many fried foods. He will also increase vegetables and lean protein in his diet and continue to work on exercise and weight loss efforts. We will check labs and [name] will follow up at the agreed upon time.  Vitamin D Deficiency [name] was informed that low vitamin D levels contributes to fatigue and are associated with obesity, breast, and colon cancer. He agrees to continue to take prescription Vit D @50 ,000 IU every week and will follow up for routine testing of vitamin D, at least 2-3 times per year. He was informed of the risk of over-replacement of vitamin D and agrees to not increase his dose unless he discusses this with [name] first. We will check labs and [name] will follow up at the agreed upon time.  Cardiovascular risk counseling [name] was given extended (15 minutes) coronary artery disease prevention counseling today. He is 42 y.o. male and has risk factors for heart disease including obesity and hyperlipidemia. We discussed intensive lifestyle modifications today with an emphasis on specific weight loss instructions and strategies. Pt was also informed of the importance of increasing exercise and decreasing saturated fats to help prevent heart disease.  Obesity [name] is currently in the action stage of change. As such, his goal is to continue with weight loss efforts He has agreed to follow the Category 4 plan [name] has been instructed to work up to a goal of 150 minutes of combined cardio and strengthening exercise per week for weight loss and overall health benefits. We discussed the following Behavioral Modification Strategies today: planning for success and work on meal planning and easy cooking plans  [name] has agreed to follow up with our clinic in 3 weeks. He was informed of the importance of frequent follow up visits to maximize his success with intensive lifestyle modifications for his multiple health conditions.   OBESITY BEHAVIORAL INTERVENTION  VISIT  Today's visit was # 6   Starting weight: 249 lbs Starting date: 01/28/18 Today's weight : 236 lbs  Today's date: 05/13/2018 Total lbs lost to date: 13 At least 15 minutes were spent on discussing the following behavioral intervention visit.   ASK: We discussed the diagnosis of obesity with [name] today and Joshu agreed to give [name] permission to discuss obesity behavioral modification therapy today.  ASSESS: Diarra has the diagnosis of obesity and his BMI today is 31.14 [name] is in the action stage of change   ADVISE: [name] was educated on the multiple health risks of obesity as well as the benefit of weight loss to improve his health. He was advised of the need for long term treatment and the importance of lifestyle modifications to improve his current health and to decrease his risk of future health problems.  AGREE: Multiple dietary modification options and treatment options were discussed and  [name] agreed to follow the recommendations documented in the above note.  ARRANGE: [name] was educated on the importance of frequent visits to treat obesity as outlined per CMS and USPSTF guidelines and agreed to schedule his next follow up appointment today.  Corey Skains, am acting as Location manager for [employer]. Pricilla Holm, PA-C I, Abby Potash, PA-C have reviewed above note and agree with its content

## 2018-05-19 ENCOUNTER — Other Ambulatory Visit: Payer: Self-pay | Admitting: Radiology

## 2018-05-19 DIAGNOSIS — I10 Essential (primary) hypertension: Secondary | ICD-10-CM | POA: Diagnosis not present

## 2018-05-19 DIAGNOSIS — E119 Type 2 diabetes mellitus without complications: Secondary | ICD-10-CM | POA: Diagnosis not present

## 2018-05-19 DIAGNOSIS — N201 Calculus of ureter: Secondary | ICD-10-CM

## 2018-05-21 ENCOUNTER — Encounter: Payer: Self-pay | Admitting: Urology

## 2018-05-21 ENCOUNTER — Ambulatory Visit
Admission: RE | Admit: 2018-05-21 | Discharge: 2018-05-21 | Disposition: A | Discharge status: Home / Self Care | Payer: 59 | Source: Ambulatory Visit | Attending: Urology | Admitting: Urology

## 2018-05-21 ENCOUNTER — Ambulatory Visit (INDEPENDENT_AMBULATORY_CARE_PROVIDER_SITE_OTHER): Payer: 59 | Admitting: Urology

## 2018-05-21 ENCOUNTER — Other Ambulatory Visit: Payer: Self-pay | Admitting: Radiology

## 2018-05-21 ENCOUNTER — Telehealth: Payer: Self-pay | Admitting: Radiology

## 2018-05-21 VITALS — BP 145/94 | HR 96 | Ht 73.0 in | Wt 242.0 lb

## 2018-05-21 DIAGNOSIS — N23 Unspecified renal colic: Secondary | ICD-10-CM

## 2018-05-21 DIAGNOSIS — N200 Calculus of kidney: Secondary | ICD-10-CM | POA: Diagnosis not present

## 2018-05-21 DIAGNOSIS — N201 Calculus of ureter: Secondary | ICD-10-CM

## 2018-05-21 DIAGNOSIS — R935 Abnormal findings on diagnostic imaging of other abdominal regions, including retroperitoneum: Secondary | ICD-10-CM | POA: Insufficient documentation

## 2018-05-21 MED ORDER — CEFAZOLIN SODIUM-DEXTROSE 2-4 GM/100ML-% IV SOLN
2.0000 g | INTRAVENOUS | Status: AC
  Administered 2018-05-22: 2 g via INTRAVENOUS

## 2018-05-21 NOTE — Telephone Encounter (Signed)
Patient states he thinks the ureteral stone seen on CT on 04/12/2018 is still there. Patient reports right flank pain, nausea & sweating & states he is "peeing dribbles but it feels like I need to go more". Per Dr Bernardo Heater advised patient to get a KUB and come to office to discuss plan with Dr Diamantina Providence.

## 2018-05-21 NOTE — Progress Notes (Signed)
05/21/2018 4:46 PM   [name] Mar 26, 1976 [id number]  Referring provider: Olin Hauser, DO [place], [place] [address]  CC: Right flank pain, nausea  HPI: I had the pleasure of seeing [name] in urology clinic today for flank pain.  He is a healthy 42 year old male who was originally diagnosed with a 4 mm right ureteral stone on CT scan 04/12/2018.  He has had intermittent flank pain and nausea over the last few weeks and has not passed the stone.  KUB today shows persistent 4 mm calcification overlying the suspected right distal ureter.  The pain is colicky and intermittent.  Duration is 5 weeks.  He denies any fevers.  Urinalysis today appears noninfected.   PMH: Past Medical History:  Diagnosis Date  . Chest pain   . Depression   . Diabetes ([place])   . Family history of adverse reaction to anesthesia    MOM-N/V, DAD-HAS PROBLEMS WITH NOVICAINE  . Food allergy   . GERD (gastroesophageal reflux disease)   . History of hiatal hernia   . History of kidney stones    H/O  . Hypertension   . Insomnia   . Joint pain   . Plantar fasciitis   . Seasonal allergies   . Tendonitis of foot    left    Surgical History: Past Surgical History:  Procedure Laterality Date  . CARPAL TUNNEL RELEASE Right 07/03/2017   Procedure: CARPAL TUNNEL RELEASE;  Surgeon: Hessie Knows, MD;  Location: [facility] ORS;  Service: Orthopedics;  Laterality: Right;  . clavide surgery  2005  . KNEE SURGERY Right 2013  . SHOULDER SURGERY Right 2008    Allergies:  Allergies  Allergen Reactions  . Tomato Other (See Comments)    GI distress    Family History: Family History  Problem Relation Age of Onset  . Cancer Mother        colon/rectal  . Hyperlipidemia Mother   . Cancer Brother        hodgekins  . Cancer Maternal Grandfather        lung  . Stroke Maternal Grandfather   . Diabetes Father   . Hypertension Father   . Hyperlipidemia Father   . Heart disease Father     . Alcoholism Father     Social History:  reports that he has never smoked. He has never used smokeless tobacco. He reports that he drinks alcohol. He reports that he does not use drugs.  ROS: Please see flowsheet from today's date for complete review of systems.  Physical Exam: There were no vitals taken for this visit.   Constitutional:  Alert and oriented, No acute distress. Cardiovascular: No clubbing, cyanosis, or edema. Respiratory: Normal respiratory effort, no increased work of breathing. GI: Abdomen is soft, nontender, nondistended, no abdominal masses GU: Right CVA tenderness Lymph: No cervical or inguinal lymphadenopathy. Skin: No rashes, bruises or suspicious lesions. Neurologic: Grossly intact, no focal deficits, moving all 4 extremities. Psychiatric: Normal mood and affect.  Laboratory Data: Urinalysis today 3-10 RBCs, 0 WBCs, no bacteria, nitrite negative  Pertinent Imaging: I have personally reviewed the CT flank 7/28, and KUB today.  Persistent 4 mm calcification overlying the right distal ureter.  Assessment & Plan:   In summary, [name] is a healthy 42 year old male with persistent 108mm right distal ureteral stone since April 12, 2018.  He is having ongoing symptoms including right flank pain and nausea and would like to pursue surgical treatment.  We  discussed ureteroscopy, laser lithotripsy, and stent placement in length including the risks of bleeding, infection, ureteral injury, and stent related symptoms.  He is in agreement to proceed. Will attempt to leave stent on a dangler if possible so he can remove stent at home early next week.  Right URS/LL/stent tomorrow  Billey Co, MD  [facility] [address], [place], [place] [address] [phone]

## 2018-05-22 ENCOUNTER — Encounter: Payer: Self-pay | Admitting: *Deleted

## 2018-05-22 ENCOUNTER — Ambulatory Visit
Admission: RE | Admit: 2018-05-22 | Discharge: 2018-05-22 | Disposition: A | Discharge status: Home / Self Care | Payer: 59 | Source: Ambulatory Visit | Attending: Urology | Admitting: Urology

## 2018-05-22 ENCOUNTER — Encounter
Admission: RE | Disposition: A | Discharge status: Home / Self Care | Payer: Self-pay | Source: Ambulatory Visit | Attending: Urology

## 2018-05-22 ENCOUNTER — Other Ambulatory Visit: Payer: Self-pay

## 2018-05-22 ENCOUNTER — Ambulatory Visit: Payer: 59 | Admitting: Anesthesiology

## 2018-05-22 DIAGNOSIS — N201 Calculus of ureter: Secondary | ICD-10-CM | POA: Insufficient documentation

## 2018-05-22 DIAGNOSIS — Z7984 Long term (current) use of oral hypoglycemic drugs: Secondary | ICD-10-CM | POA: Diagnosis not present

## 2018-05-22 DIAGNOSIS — Z91018 Allergy to other foods: Secondary | ICD-10-CM | POA: Insufficient documentation

## 2018-05-22 DIAGNOSIS — Z823 Family history of stroke: Secondary | ICD-10-CM | POA: Insufficient documentation

## 2018-05-22 DIAGNOSIS — I10 Essential (primary) hypertension: Secondary | ICD-10-CM | POA: Insufficient documentation

## 2018-05-22 DIAGNOSIS — Z8249 Family history of ischemic heart disease and other diseases of the circulatory system: Secondary | ICD-10-CM | POA: Insufficient documentation

## 2018-05-22 DIAGNOSIS — Z791 Long term (current) use of non-steroidal anti-inflammatories (NSAID): Secondary | ICD-10-CM | POA: Insufficient documentation

## 2018-05-22 DIAGNOSIS — E119 Type 2 diabetes mellitus without complications: Secondary | ICD-10-CM | POA: Insufficient documentation

## 2018-05-22 DIAGNOSIS — F329 Major depressive disorder, single episode, unspecified: Secondary | ICD-10-CM | POA: Insufficient documentation

## 2018-05-22 DIAGNOSIS — Z800 Family history of malignant neoplasm of digestive organs: Secondary | ICD-10-CM | POA: Insufficient documentation

## 2018-05-22 DIAGNOSIS — Z79899 Other long term (current) drug therapy: Secondary | ICD-10-CM | POA: Insufficient documentation

## 2018-05-22 DIAGNOSIS — Z87442 Personal history of urinary calculi: Secondary | ICD-10-CM | POA: Insufficient documentation

## 2018-05-22 DIAGNOSIS — G4700 Insomnia, unspecified: Secondary | ICD-10-CM | POA: Diagnosis not present

## 2018-05-22 DIAGNOSIS — K219 Gastro-esophageal reflux disease without esophagitis: Secondary | ICD-10-CM | POA: Insufficient documentation

## 2018-05-22 HISTORY — PX: CYSTOSCOPY/URETEROSCOPY/HOLMIUM LASER/STENT PLACEMENT: SHX6546

## 2018-05-22 LAB — GLUCOSE, CAPILLARY
Glucose-Capillary: 141 mg/dL — ABNORMAL HIGH (ref 70–99)
Glucose-Capillary: 141 mg/dL — ABNORMAL HIGH (ref 70–99)

## 2018-05-22 SURGERY — CYSTOSCOPY/URETEROSCOPY/HOLMIUM LASER/STENT PLACEMENT
Anesthesia: General | Site: Penis | Laterality: Right | Wound class: "Clean Contaminated "

## 2018-05-22 MED ORDER — LIDOCAINE HCL (PF) 2 % IJ SOLN
INTRAMUSCULAR | Status: AC
  Filled 2018-05-22: qty 10

## 2018-05-22 MED ORDER — LIDOCAINE HCL (CARDIAC) PF 100 MG/5ML IV SOSY
PREFILLED_SYRINGE | INTRAVENOUS | Status: DC | PRN
  Administered 2018-05-22: 40 mg via INTRAVENOUS

## 2018-05-22 MED ORDER — SUCCINYLCHOLINE CHLORIDE 20 MG/ML IJ SOLN
INTRAMUSCULAR | Status: DC | PRN
  Administered 2018-05-22: 120 mg via INTRAVENOUS

## 2018-05-22 MED ORDER — OXYBUTYNIN CHLORIDE ER 10 MG PO TB24: 10.0000 mg | ORAL_TABLET | Freq: Every day | ORAL | 0 refills | Status: AC | PRN

## 2018-05-22 MED ORDER — EPHEDRINE SULFATE 50 MG/ML IJ SOLN
INTRAMUSCULAR | Status: DC | PRN
  Administered 2018-05-22: 10 mg via INTRAVENOUS

## 2018-05-22 MED ORDER — DEXAMETHASONE SODIUM PHOSPHATE 10 MG/ML IJ SOLN
INTRAMUSCULAR | Status: AC
  Filled 2018-05-22: qty 1

## 2018-05-22 MED ORDER — MIDAZOLAM HCL 2 MG/2ML IJ SOLN
INTRAMUSCULAR | Status: DC | PRN
  Administered 2018-05-22: 2 mg via INTRAVENOUS

## 2018-05-22 MED ORDER — SODIUM CHLORIDE 0.9 % IV SOLN
INTRAVENOUS | Status: DC
  Administered 2018-05-22 (×2): via INTRAVENOUS

## 2018-05-22 MED ORDER — SUGAMMADEX SODIUM 200 MG/2ML IV SOLN
INTRAVENOUS | Status: AC
  Filled 2018-05-22: qty 2

## 2018-05-22 MED ORDER — KETOROLAC TROMETHAMINE 30 MG/ML IJ SOLN
INTRAMUSCULAR | Status: DC | PRN
  Administered 2018-05-22: 15 mg via INTRAVENOUS

## 2018-05-22 MED ORDER — CEFAZOLIN SODIUM-DEXTROSE 2-4 GM/100ML-% IV SOLN
INTRAVENOUS | Status: AC
  Filled 2018-05-22: qty 100

## 2018-05-22 MED ORDER — SEVOFLURANE IN SOLN
RESPIRATORY_TRACT | Status: AC
  Filled 2018-05-22: qty 250

## 2018-05-22 MED ORDER — PROPOFOL 10 MG/ML IV BOLUS
INTRAVENOUS | Status: DC | PRN
  Administered 2018-05-22: 150 mg via INTRAVENOUS

## 2018-05-22 MED ORDER — SUGAMMADEX SODIUM 500 MG/5ML IV SOLN
INTRAVENOUS | Status: DC | PRN
  Administered 2018-05-22: 220 mg via INTRAVENOUS

## 2018-05-22 MED ORDER — ONDANSETRON HCL 4 MG/2ML IJ SOLN: 4.0000 mg | Freq: Once | INTRAMUSCULAR | Status: DC | PRN

## 2018-05-22 MED ORDER — ONDANSETRON HCL 4 MG/2ML IJ SOLN
INTRAMUSCULAR | Status: AC
  Filled 2018-05-22: qty 2

## 2018-05-22 MED ORDER — MIDAZOLAM HCL 2 MG/2ML IJ SOLN
INTRAMUSCULAR | Status: AC
  Filled 2018-05-22: qty 2

## 2018-05-22 MED ORDER — DEXAMETHASONE SODIUM PHOSPHATE 10 MG/ML IJ SOLN
INTRAMUSCULAR | Status: DC | PRN
  Administered 2018-05-22: 6 mg via INTRAVENOUS

## 2018-05-22 MED ORDER — TAMSULOSIN HCL 0.4 MG PO CAPS: 0.4000 mg | ORAL_CAPSULE | Freq: Every day | ORAL | 0 refills | Status: DC

## 2018-05-22 MED ORDER — PROPOFOL 10 MG/ML IV BOLUS
INTRAVENOUS | Status: AC
  Filled 2018-05-22: qty 20

## 2018-05-22 MED ORDER — FENTANYL CITRATE (PF) 100 MCG/2ML IJ SOLN
INTRAMUSCULAR | Status: AC
  Filled 2018-05-22: qty 2

## 2018-05-22 MED ORDER — ROCURONIUM BROMIDE 100 MG/10ML IV SOLN
INTRAVENOUS | Status: DC | PRN
  Administered 2018-05-22: 25 mg via INTRAVENOUS
  Administered 2018-05-22: 5 mg via INTRAVENOUS
  Administered 2018-05-22: 10 mg via INTRAVENOUS

## 2018-05-22 MED ORDER — IOPAMIDOL (ISOVUE-M 200) INJECTION 41%
INTRAMUSCULAR | Status: DC | PRN
  Administered 2018-05-22: 20 mL

## 2018-05-22 MED ORDER — ONDANSETRON HCL 4 MG/2ML IJ SOLN
INTRAMUSCULAR | Status: DC | PRN
  Administered 2018-05-22: 4 mg via INTRAVENOUS

## 2018-05-22 MED ORDER — FENTANYL CITRATE (PF) 100 MCG/2ML IJ SOLN: 25.0000 ug | INTRAMUSCULAR | Status: DC | PRN

## 2018-05-22 MED ORDER — FENTANYL CITRATE (PF) 100 MCG/2ML IJ SOLN
INTRAMUSCULAR | Status: DC | PRN
  Administered 2018-05-22: 50 ug via INTRAVENOUS

## 2018-05-22 MED ORDER — ROCURONIUM BROMIDE 50 MG/5ML IV SOLN
INTRAVENOUS | Status: AC
  Filled 2018-05-22: qty 1

## 2018-05-22 MED ORDER — SUCCINYLCHOLINE CHLORIDE 20 MG/ML IJ SOLN
INTRAMUSCULAR | Status: AC
  Filled 2018-05-22: qty 1

## 2018-05-22 MED ORDER — PHENYLEPHRINE HCL 10 MG/ML IJ SOLN
INTRAMUSCULAR | Status: DC | PRN
  Administered 2018-05-22 (×4): 100 ug via INTRAVENOUS

## 2018-05-22 SURGICAL SUPPLY — 29 items
BAG DRAIN CYSTO-URO LG1000N (MISCELLANEOUS) ×3 IMPLANT
BRUSH SCRUB EZ 1% IODOPHOR (MISCELLANEOUS) ×3 IMPLANT
BULB IRRIG PATHFIND (MISCELLANEOUS) ×3 IMPLANT
CATH URETL 5X70 OPEN END (CATHETERS) ×2 IMPLANT
CNTNR SPEC 2.5X3XGRAD LEK (MISCELLANEOUS)
CONT SPEC 4OZ STER OR WHT (MISCELLANEOUS)
CONTAINER SPEC 2.5X3XGRAD LEK (MISCELLANEOUS) IMPLANT
DRAPE UTILITY 15X26 TOWEL STRL (DRAPES) ×3 IMPLANT
FIBER LASER LITHO 273 (Laser) ×2 IMPLANT
GLOVE BIO SURGEON STRL SZ7.5 (GLOVE) ×3 IMPLANT
GOWN STRL REUS W/ TWL LRG LVL3 (GOWN DISPOSABLE) ×1 IMPLANT
GOWN STRL REUS W/ TWL XL LVL3 (GOWN DISPOSABLE) ×1 IMPLANT
GOWN STRL REUS W/TWL LRG LVL3 (GOWN DISPOSABLE) ×2
GOWN STRL REUS W/TWL XL LVL3 (GOWN DISPOSABLE) ×2
GUIDEWIRE GREEN .038 145CM (MISCELLANEOUS) IMPLANT
INTRODUCER DILATOR DOUBLE (INTRODUCER) IMPLANT
KIT TURNOVER CYSTO (KITS) ×3 IMPLANT
PACK CYSTO AR (MISCELLANEOUS) ×3 IMPLANT
SENSORWIRE 0.038 NOT ANGLED (WIRE) ×6
SET CYSTO W/LG BORE CLAMP LF (SET/KITS/TRAYS/PACK) ×3 IMPLANT
SHEATH URETERAL 12FRX35CM (MISCELLANEOUS) IMPLANT
SOL .9 NS 3000ML IRR  AL (IV SOLUTION) ×2
SOL .9 NS 3000ML IRR UROMATIC (IV SOLUTION) ×1 IMPLANT
STENT URET 6FRX24 CONTOUR (STENTS) IMPLANT
STENT URET 6FRX26 CONTOUR (STENTS) IMPLANT
STENT URET 6FRX28 CONTOUR (STENTS) ×2 IMPLANT
SURGILUBE 2OZ TUBE FLIPTOP (MISCELLANEOUS) ×3 IMPLANT
WATER STERILE IRR 1000ML POUR (IV SOLUTION) ×3 IMPLANT
WIRE SENSOR 0.038 NOT ANGLED (WIRE) ×1 IMPLANT

## 2018-05-22 NOTE — Transfer of Care (Signed)
Immediate Anesthesia Transfer of Care Note  Patient: [name]  Procedure(s) Performed: CYSTOSCOPY/URETEROSCOPY/HOLMIUM LASER/STENT PLACEMENT (Right Penis)  Patient Location: PACU  Anesthesia Type:General  Level of Consciousness: awake  Airway & Oxygen Therapy: Patient connected to face mask oxygen  Post-op Assessment: Report given to RN and Post -op Vital signs reviewed and stable  Post vital signs: Reviewed and stable  Last Vitals:  Vitals Value Taken Time  BP 115/76 05/22/2018  9:52 AM  Temp 36.1 C 05/22/2018  9:52 AM  Pulse 84 05/22/2018  9:58 AM  Resp 10 05/22/2018  9:55 AM  SpO2 100 % 05/22/2018  9:58 AM  Vitals shown include unvalidated device data.  Last Pain:  Vitals:   05/22/18 0952  TempSrc:   PainSc: 0-No pain         Complications: No apparent anesthesia complications

## 2018-05-22 NOTE — Anesthesia Post-op Follow-up Note (Signed)
Anesthesia QCDR form completed.        

## 2018-05-22 NOTE — Anesthesia Postprocedure Evaluation (Signed)
Anesthesia Post Note  Patient: [name]  Procedure(s) Performed: CYSTOSCOPY/URETEROSCOPY/HOLMIUM LASER/STENT PLACEMENT (Right Penis)  Patient location during evaluation: PACU Anesthesia Type: General Level of consciousness: awake and alert Pain management: pain level controlled Vital Signs Assessment: post-procedure vital signs reviewed and stable Respiratory status: spontaneous breathing, nonlabored ventilation, respiratory function stable and patient connected to nasal cannula oxygen Cardiovascular status: blood pressure returned to baseline and stable Postop Assessment: no apparent nausea or vomiting Anesthetic complications: no     Last Vitals:  Vitals:   05/22/18 1034 05/22/18 1056  BP: 125/75 120/75  Pulse: 77 71  Resp: 16   Temp: (!) 36.1 C   SpO2: 99% 100%    Last Pain:  Vitals:   05/22/18 1056  TempSrc:   PainSc: 0-No pain                 Shateka Petrea S

## 2018-05-22 NOTE — Anesthesia Procedure Notes (Signed)
Procedure Name: Intubation Date/Time: 05/22/2018 8:51 AM Performed by: Allean Found, CRNA Pre-anesthesia Checklist: Patient identified, Patient being monitored, Timeout performed, Emergency Drugs available and Suction available Patient Re-evaluated:Patient Re-evaluated prior to induction Oxygen Delivery Method: Circle system utilized Preoxygenation: Pre-oxygenation with 100% oxygen Induction Type: IV induction Ventilation: Mask ventilation without difficulty Laryngoscope Size: Mac, 3 and 4 Grade View: Grade II Tube type: Oral Tube size: 7.5 mm Number of attempts: 1 Airway Equipment and Method: Stylet Placement Confirmation: ETT inserted through vocal cords under direct vision,  positive ETCO2 and breath sounds checked- equal and bilateral Secured at: 21 cm Tube secured with: Tape Dental Injury: Teeth and Oropharynx as per pre-operative assessment

## 2018-05-22 NOTE — H&P (Signed)
UROLOGY H&P UPDATE  42 yo M with 20mm RIGHT distal ureteral stone ~5 weeks and ongoing flank pain  Cardiac: RRR Lungs: CTA bilaterally  Urinalysis 9/5 non-infected  Informed consent obtained for RIGHT URS/LL/stent. Discussed risks of bleeding, infection, stent symptoms, possible staged procedure.  Billey Co, MD 05/22/2018

## 2018-05-22 NOTE — Op Note (Signed)
Date of procedure: 05/22/18  Preoperative diagnosis:  1. Right 4 mm obstructing distal ureteral stone  Postoperative diagnosis:  1. Same  Procedure: 1. Cystoscopy 2.   Retrograde pyelogram with intraoperative interpretation 3.   Right ureteroscopy, laser lithotripsy, right ureteral stent placement  Surgeon: Nickolas Madrid, MD  Anesthesia: General  Complications: None  Intraoperative findings: Normal cystoscopy.  Small, hard to 4 mm right distal ureteral stone consistent with calcium oxalate monohydrate  EBL: Minimal  Specimens: None  Drains: Right 6 French x 28 cm stent  Indication: [name] is a 42 y.o. patient with right 4 mm distal ureteral stone for approximately 5 weeks with ongoing right flank pain and nausea.  After reviewing the management options for treatment, they elected to proceed with the above surgical procedure(s). We have discussed the potential benefits and risks of the procedure, side effects of the proposed treatment, the likelihood of the patient achieving the goals of the procedure, and any potential problems that might occur during the procedure or recuperation. Informed consent has been obtained.  Description of procedure:  The patient was taken to the operating room and general anesthesia was induced.  The patient was placed in the dorsal lithotomy position, prepped and draped in the usual sterile fashion, and preoperative antibiotics(cipro) were administered. A preoperative time-out was performed.   The [employer] sounds were used to dilate the urethral meatus to 24 [place].  A 21 French rigid cystoscope with a 30 degree lens was used to intubate the urethra.  The urethra was grossly normal throughout and the prostate was moderate in size.  Thorough cystoscopy revealed no concerning lesions, and the ureteral orifices were orthotopic bilaterally.  We turned our attention to the right ureteral orifice and a retrograde pyelogram was performed via a 5 [place]  access catheter which showed a filling defect in the distal ureter consistent with his known stone.  We then advanced a 0.035 sensor wire through the access catheter alongside the stone up to the collecting system under fluoroscopic vision.  We then removed the rigid cystoscope and attempted to pass the semirigid ureteroscope, however we were unable to intubate the right ureter.  A second 0.035 sensor wire was added using the train track technique and we were able to intubate the ureter with the semirigid ureteroscope.  Notably this was extremely narrow and tight.  We immediately identified a black small stone, and using the 273 m laser fiber on settings of 1 J and 10 Hz this was fragmented to dust.  The fragments were irrigated out of the ureter.  Thorough ureteroscopy demonstrated no residual fragments.  Contrast was injected to opacify the collecting system to aid in stent placement.  The rigid cystoscope was backloaded over the sensor wire and a 6 French x 28 cm stent was placed uneventfully.   A good curl was noted in the renal pelvis on fluoroscopy and under direct vision the bladder.  I did not leave a Dangler on the stent in the setting of significant narrowing of the distal ureter with edema.  Disposition: Stable to PACU  Plan: Discharge home today.  Follow-up in 1 week in clinic for ureteral stent removal, then renal ultrasound in 6 weeks.   Nickolas Madrid, MD

## 2018-05-22 NOTE — Anesthesia Preprocedure Evaluation (Addendum)
Anesthesia Evaluation  Patient identified by MRN, date of birth, ID band Patient awake    Reviewed: Allergy & Precautions, NPO status , Patient's Chart, lab work & pertinent test results, reviewed documented beta blocker date and time   Airway Mallampati: III  TM Distance: >3 FB     Dental  (+) Chipped   Pulmonary           Cardiovascular hypertension, Pt. on medications      Neuro/Psych PSYCHIATRIC DISORDERS Depression    GI/Hepatic hiatal hernia, GERD  Controlled,  Endo/Other  diabetes, Type 2  Renal/GU      Musculoskeletal  (+) Arthritis ,   Abdominal   Peds  Hematology   Anesthesia Other Findings Obese. Had some nausea and vomiting this am. Will go with GOT.  Reproductive/Obstetrics                            Anesthesia Physical Anesthesia Plan  ASA: III  Anesthesia Plan: General   Post-op Pain Management:    Induction: Intravenous  PONV Risk Score and Plan:   Airway Management Planned: Oral ETT  Additional Equipment:   Intra-op Plan:   Post-operative Plan:   Informed Consent: I have reviewed the patients History and Physical, chart, labs and discussed the procedure including the risks, benefits and alternatives for the proposed anesthesia with the patient or authorized representative who has indicated his/her understanding and acceptance.     Plan Discussed with: CRNA  Anesthesia Plan Comments:        Anesthesia Quick Evaluation

## 2018-05-28 ENCOUNTER — Encounter: Payer: Self-pay | Admitting: Urology

## 2018-05-28 ENCOUNTER — Ambulatory Visit (INDEPENDENT_AMBULATORY_CARE_PROVIDER_SITE_OTHER): Payer: 59 | Admitting: Urology

## 2018-05-28 ENCOUNTER — Other Ambulatory Visit: Payer: Self-pay

## 2018-05-28 VITALS — BP 132/93 | HR 103 | Ht 73.0 in | Wt 243.0 lb

## 2018-05-28 DIAGNOSIS — N201 Calculus of ureter: Secondary | ICD-10-CM | POA: Diagnosis not present

## 2018-05-28 LAB — URINALYSIS, COMPLETE
Bilirubin, UA: NEGATIVE
GLUCOSE, UA: NEGATIVE
KETONES UA: NEGATIVE
NITRITE UA: NEGATIVE
Protein, UA: NEGATIVE
UUROB: 0.2 mg/dL (ref 0.2–1.0)
pH, UA: 6 (ref 5.0–7.5)

## 2018-05-28 LAB — MICROSCOPIC EXAMINATION: Epithelial Cells (non renal): NONE SEEN /hpf (ref 0–10)

## 2018-05-28 MED ORDER — CIPROFLOXACIN HCL 500 MG PO TABS
500.0000 mg | ORAL_TABLET | Freq: Once | ORAL | Status: AC
  Administered 2018-05-28: 500 mg via ORAL

## 2018-05-28 NOTE — Progress Notes (Signed)
Cystoscopy Procedure Note:  Indication: Stent removal, s/p R URS/LL/stent for 44mm distal stone 05/22/2018  After informed consent and discussion of the procedure and its risks, [name] was positioned and prepped in the standard fashion. Cystoscopy was performed with a flexible cystoscope. The urethra, bladder neck and entire bladder was visualized in a standard fashion. The stent was grasped and removed in its entirety.  Findings: Uncomplicated stent removal  Assessment and Plan: Follow up one year with [facility]  Stone prevention brochure provided  Billey Co, MD 05/28/2018

## 2018-06-03 ENCOUNTER — Ambulatory Visit (INDEPENDENT_AMBULATORY_CARE_PROVIDER_SITE_OTHER): Payer: 59 | Admitting: Physician Assistant

## 2018-06-03 ENCOUNTER — Encounter (INDEPENDENT_AMBULATORY_CARE_PROVIDER_SITE_OTHER): Payer: Self-pay | Admitting: Physician Assistant

## 2018-06-03 VITALS — BP 127/81 | HR 85 | Temp 97.6°F | Ht 73.0 in | Wt 240.0 lb

## 2018-06-03 DIAGNOSIS — Z9189 Other specified personal risk factors, not elsewhere classified: Secondary | ICD-10-CM

## 2018-06-03 DIAGNOSIS — Z6831 Body mass index (BMI) 31.0-31.9, adult: Secondary | ICD-10-CM

## 2018-06-03 DIAGNOSIS — E559 Vitamin D deficiency, unspecified: Secondary | ICD-10-CM | POA: Diagnosis not present

## 2018-06-03 DIAGNOSIS — E669 Obesity, unspecified: Secondary | ICD-10-CM

## 2018-06-03 DIAGNOSIS — E119 Type 2 diabetes mellitus without complications: Secondary | ICD-10-CM | POA: Diagnosis not present

## 2018-06-03 MED ORDER — VITAMIN D (ERGOCALCIFEROL) 1.25 MG (50000 UNIT) PO CAPS: 50000.0000 [IU] | ORAL_CAPSULE | ORAL | 0 refills | Status: DC

## 2018-06-03 NOTE — Progress Notes (Signed)
Office: [phone]  /  Fax: [phone]   HPI:   Chief Complaint: OBESITY [name] is here to discuss his progress with his obesity treatment plan. He is on the Category 4  plan and is following his eating plan approximately 15 % of the time. He states he is exercising 0 minutes 0 times per week. [name] reports that he has struggled with following the plan due to having a kidney stone. He is feeling better and ready to get back on track.  His weight is 240 lb (108.9 kg) today and has not lost weight since his last visit. He has lost 9 lbs since starting treatment with [name].  Diabetes II [name] has a diagnosis of diabetes type II. Chelsea states fasting BGs range between 105 and 138.  His last A1c was 6.2. He is on metformin. He has been working on intensive lifestyle modifications including diet, exercise, and weight loss to help control his blood glucose levels.  Vitamin D deficiency [name] has a diagnosis of vitamin D deficiency. He is currently taking prescription vit D and denies nausea, vomiting or muscle weakness.  At risk for osteopenia and osteoporosis [name] is at higher risk of osteopenia and osteoporosis due to vitamin D deficiency.   ALLERGIES: Allergies  Allergen Reactions  . Tomato Other (See Comments)    GI distress    MEDICATIONS: Current Outpatient Medications on File Prior to Visit  Medication Sig Dispense Refill  . buPROPion (WELLBUTRIN XL) 150 MG 24 hr tablet Take 150 mg by mouth daily.   12  . DULoxetine (CYMBALTA) 60 MG capsule Take 60 mg by mouth daily.     [name] esomeprazole (NEXIUM) 40 MG capsule Take 1 capsule (40 mg total) every morning by mouth. 90 capsule 3  . etodolac (LODINE) 500 MG tablet TAKE 1 TABLET (500 MG TOTAL) BY MOUTH 2 (TWO) TIMES DAILY. (Patient taking differently: Take 500 mg by mouth 2 (two) times daily as needed (pain). ) 60 tablet 2  . Ketorolac Tromethamine (SPRIX) 15.75 MG/SPRAY SOLN 1 spray each nostril q 6-8 hours prn pain 5 each 0  .  metFORMIN (GLUCOPHAGE) 1000 MG tablet TAKE 1 TABLET BY MOUTH 2 TIMES DAILY WITH A MEAL. 60 tablet 0  . Multiple Vitamin (MULTIVITAMIN) capsule Take 1 capsule by mouth daily.    . ondansetron (ZOFRAN-ODT) 8 MG disintegrating tablet Take 1 tablet (8 mg total) by mouth every 8 (eight) hours as needed for nausea or vomiting. 20 tablet 0  . oxybutynin (DITROPAN XL) 10 MG 24 hr tablet Take 1 tablet (10 mg total) by mouth daily as needed for up to 14 days. Take as needed for bladder spasms/pain 14 tablet 0  . oxyCODONE-acetaminophen (PERCOCET) 5-325 MG tablet Take 1-2 tablets by mouth every 6 (six) hours as needed for severe pain. 30 tablet 0  . tamsulosin (FLOMAX) 0.4 MG CAPS capsule Take 1 capsule (0.4 mg total) by mouth daily. 10 capsule 0  . tamsulosin (FLOMAX) 0.4 MG CAPS capsule Take 1 capsule (0.4 mg total) by mouth daily after supper. 14 capsule 0  . valsartan-hydrochlorothiazide (DIOVAN-HCT) 160-12.5 MG tablet Take 1 tablet by mouth every morning. 90 tablet 3   No current facility-administered medications on file prior to visit.     PAST MEDICAL HISTORY: Past Medical History:  Diagnosis Date  . Chest pain   . Depression   . Diabetes ([place])   . Family history of adverse reaction to anesthesia    MOM-N/V, DAD-HAS PROBLEMS WITH NOVICAINE  .  Food allergy   . GERD (gastroesophageal reflux disease)   . History of hiatal hernia   . History of kidney stones    H/O  . Hypertension   . Insomnia   . Joint pain   . Plantar fasciitis   . Seasonal allergies   . Tendonitis of foot    left    PAST SURGICAL HISTORY: Past Surgical History:  Procedure Laterality Date  . CARPAL TUNNEL RELEASE Right 07/03/2017   Procedure: CARPAL TUNNEL RELEASE;  Surgeon: Hessie Knows, MD;  Location: [facility] OR[facility];  Service: Orthopedics;  Laterality: Right;  . clavide surgery  2005  . CYSTOSCOPY/URETEROSCOPY/HOLMIUM LASER/STENT PLACEMENT Right 05/22/2018   Procedure: CYSTOSCOPY/URETEROSCOPY/HOLMIUM LASER/STENT  PLACEMENT;  Surgeon: Billey Co, MD;  Location: [facility] OR[facility];  Service: Urology;  Laterality: Right;  . KNEE SURGERY Right 2013  . SHOULDER SURGERY Right 2008    SOCIAL HISTORY: Social History   Tobacco Use  . Smoking status: Never Smoker  . Smokeless tobacco: Never Used  Substance Use Topics  . Alcohol use: Yes    Alcohol/week: 0.0 standard drinks    Comment: RARE  . Drug use: No    FAMILY HISTORY: Family History  Problem Relation Age of Onset  . Cancer Mother        colon/rectal  . Hyperlipidemia Mother   . Cancer Brother        hodgekins  . Cancer Maternal Grandfather        lung  . Stroke Maternal Grandfather   . Diabetes Father   . Hypertension Father   . Hyperlipidemia Father   . Heart disease Father   . Alcoholism Father     ROS: Review of Systems  Constitutional: Negative for weight loss.  Gastrointestinal: Negative for diarrhea, nausea and vomiting.  Musculoskeletal:       Negative for muscle weakness.    PHYSICAL EXAM: Blood pressure 127/81, pulse 85, temperature 97.6 F (36.4 C), temperature source Oral, height 6\' 1"  (1.854 m), weight 240 lb (108.9 kg), SpO2 98 %. Body mass index is 31.66 kg/m. Physical Exam  Constitutional: He is oriented to person, place, and time. He appears well-developed and well-nourished.  Cardiovascular: Normal rate.  Pulmonary/Chest: Effort normal.  Musculoskeletal: Normal range of motion.  Neurological: He is oriented to person, place, and time.  Skin: Skin is warm and dry.  Psychiatric: He has a normal mood and affect. His behavior is normal.  Vitals reviewed.   RECENT LABS AND TESTS: BMET    Component Value Date/Time   NA 140 04/12/2018 1820   NA 138 01/28/2018 1007   NA 138 05/20/2013 0815   K 3.9 04/12/2018 1820   K 3.7 05/20/2013 0815   CL 107 04/12/2018 1820   CL 106 05/20/2013 0815   CO2 22 04/12/2018 1820   CO2 26 05/20/2013 0815   GLUCOSE 151 (H) 04/12/2018 1820   GLUCOSE 101 (H) 05/20/2013 0815    BUN 18 04/12/2018 1820   BUN 18 01/28/2018 1007   BUN 20 (H) 05/20/2013 0815   CREATININE 1.07 04/12/2018 1820   CREATININE 0.99 05/20/2013 0815   CALCIUM 9.6 04/12/2018 1820   CALCIUM 8.9 05/20/2013 0815   GFRNONAA >60 04/12/2018 1820   GFRNONAA >60 05/20/2013 0815   GFRAA >60 04/12/2018 1820   GFRAA >60 05/20/2013 0815   Lab Results  Component Value Date   HGBA1C 6.2 (H) 01/28/2018   HGBA1C 6.7 11/07/2017   HGBA1C 6.3 05/08/2017   HGBA1C 6.5 11/06/2016   HGBA1C 10.6 (  H) 11/06/2011   Lab Results  Component Value Date   INSULIN 11.6 05/13/2018   INSULIN 10.5 01/28/2018   CBC    Component Value Date/Time   WBC 11.1 (H) 04/12/2018 1820   RBC 5.31 04/12/2018 1820   HGB 15.5 04/12/2018 1820   HGB 14.6 01/28/2018 1007   HCT 44.9 04/12/2018 1820   HCT 43.5 01/28/2018 1007   PLT 324 04/12/2018 1820   PLT 250 06/23/2017 1104   MCV 84.4 04/12/2018 1820   MCV 84 01/28/2018 1007   MCV 84 11/05/2011 1606   MCH 29.2 04/12/2018 1820   MCHC 34.6 04/12/2018 1820   RDW 13.5 04/12/2018 1820   RDW 14.3 01/28/2018 1007   RDW 13.0 11/05/2011 1606   LYMPHSABS 2.5 04/12/2018 1820   LYMPHSABS 1.5 01/28/2018 1007   LYMPHSABS 1.8 11/05/2011 1606   MONOABS 0.9 04/12/2018 1820   MONOABS 0.6 11/05/2011 1606   EOSABS 0.5 04/12/2018 1820   EOSABS 0.2 01/28/2018 1007   EOSABS 0.1 11/05/2011 1606   BASOSABS 0.1 04/12/2018 1820   BASOSABS 0.0 01/28/2018 1007   BASOSABS 0.0 11/05/2011 1606   Iron/TIBC/Ferritin/ %Sat No results found for: IRON, TIBC, FERRITIN, IRONPCTSAT Lipid Panel     Component Value Date/Time   CHOL 175 05/13/2018 0845   TRIG 106 05/13/2018 0845   HDL 51 05/13/2018 0845   CHOLHDL 3.5 06/23/2017 1104   LDLCALC 103 (H) 05/13/2018 0845   Hepatic Function Panel     Component Value Date/Time   PROT 7.8 04/12/2018 1820   PROT 7.0 01/28/2018 1007   PROT 8.5 (H) 11/05/2011 1606   ALBUMIN 4.6 04/12/2018 1820   ALBUMIN 4.4 01/28/2018 1007   ALBUMIN 4.5 11/05/2011  1606   AST 22 04/12/2018 1820   AST 36 11/05/2011 1606   ALT 19 04/12/2018 1820   ALT 53 11/05/2011 1606   ALKPHOS 63 04/12/2018 1820   ALKPHOS 147 (H) 11/05/2011 1606   BILITOT 0.7 04/12/2018 1820   BILITOT 0.3 01/28/2018 1007   BILITOT 0.5 11/05/2011 1606      Component Value Date/Time   TSH 1.330 01/28/2018 1007   TSH 1.440 01/04/2016 0818   Results for Hughley, Jayten C "NED" (MRN [id number]) as of 06/03/2018 16:23  Ref. Range 05/13/2018 08:45  Vitamin D, 25-Hydroxy Latest Ref Range: 30.0 - 100.0 ng/mL 37.8   ASSESSMENT AND PLAN: Type 2 diabetes mellitus without complication, without long-term current use of insulin (HCC)  Vitamin D deficiency - Plan: Vitamin D, Ergocalciferol, (DRISDOL) 50000 units CAPS capsule  At risk for osteoporosis  Class 1 obesity with serious comorbidity and body mass index (BMI) of 31.0 to 31.9 in adult, unspecified obesity type  PLAN: Diabetes II [name] has been given extensive diabetes education by myself today including ideal fasting and post-prandial blood glucose readings, individual ideal Hgb A1c goals and hypoglycemia prevention. We discussed the importance of good blood sugar control to decrease the likelihood of diabetic complications such as nephropathy, neuropathy, limb loss, blindness, coronary artery disease, and death. We discussed the importance of intensive lifestyle modification including diet, exercise and weight loss as the first line treatment for diabetes. [name] agrees to continue his metformin, diet and exercise and will follow up at the agreed upon time.  Vitamin D Deficiency [name] was informed that low vitamin D levels contributes to fatigue and are associated with obesity, breast, and colon cancer. He agrees to continue to take prescription Vit D @50 ,000 IU every week #4 with no refills and will follow up  for routine testing of vitamin D, at least 2-3 times per year. He was informed of the risk of over-replacement of vitamin D and  agrees to not increase his dose unless he discusses this with [name] first. [name] agrees to follow up in 3 weeks.   At risk for osteopenia and osteoporosis [name] was given extended (15 minutes) osteoporosis prevention counseling today. [name] is at risk for osteopenia and osteoporosis due to his vitamin D deficiency. He was encouraged to take his vitamin D and follow his higher calcium diet and increase strengthening exercise to help strengthen his bones and decrease his risk of osteopenia and osteoporosis.  Obesity [name] is currently in the action stage of change. As such, his goal is to continue with weight loss efforts. He has agreed to follow the Category 4 plan. [name] has been instructed to work up to a goal of 150 minutes of combined cardio and strengthening exercise per week for weight loss and overall health benefits. We discussed the following Behavioral Modification Strategies today: work on meal planning and easy cooking plans and ways to avoid boredom eating.  [name] has agreed to follow up with our clinic in 3 weeks. He was informed of the importance of frequent follow up visits to maximize his success with intensive lifestyle modifications for his multiple health conditions.   OBESITY BEHAVIORAL INTERVENTION VISIT  Today's visit was # 7  Starting weight: 249 lbs Starting date: 01/28/18 Today's weight : Weight: 240 lb (108.9 kg)  Today's date: 06/03/2018 Total lbs lost to date: 9  ASK: We discussed the diagnosis of obesity with [name] today and [name] agreed to give [name] permission to discuss obesity behavioral modification therapy today.  ASSESS: [name] has the diagnosis of obesity and his BMI today is 31.67.  [name] is in the action stage of change.  ADVISE: [name] was educated on the multiple health risks of obesity as well as the benefit of weight loss to improve his health. He was advised of the need for long term treatment and the importance of lifestyle  modifications to improve his current health and to decrease his risk of future health problems.  AGREE: Multiple dietary modification options and treatment options were discussed and [name] agreed to follow the recommendations documented in the above note.  ARRANGE: [name] was educated on the importance of frequent visits to treat obesity as outlined per CMS and USPSTF guidelines and agreed to schedule his next follow up appointment today.  [name] Chancellor, am acting as transcriptionist for Abby Potash, PA-C I, Abby Potash, PA-C have reviewed above note and agree with its content

## 2018-06-09 ENCOUNTER — Encounter (INDEPENDENT_AMBULATORY_CARE_PROVIDER_SITE_OTHER): Payer: Self-pay | Admitting: Physician Assistant

## 2018-06-09 ENCOUNTER — Other Ambulatory Visit (INDEPENDENT_AMBULATORY_CARE_PROVIDER_SITE_OTHER): Payer: Self-pay | Admitting: Family Medicine

## 2018-06-09 DIAGNOSIS — E119 Type 2 diabetes mellitus without complications: Secondary | ICD-10-CM

## 2018-06-10 MED ORDER — METFORMIN HCL 1000 MG PO TABS: 1000.0000 mg | ORAL_TABLET | Freq: Two times a day (BID) | ORAL | 0 refills | Status: DC

## 2018-06-12 ENCOUNTER — Ambulatory Visit: Payer: 59 | Admitting: Urology

## 2018-06-22 ENCOUNTER — Other Ambulatory Visit: Payer: 59

## 2018-06-22 DIAGNOSIS — K219 Gastro-esophageal reflux disease without esophagitis: Secondary | ICD-10-CM | POA: Diagnosis not present

## 2018-06-22 DIAGNOSIS — F3342 Major depressive disorder, recurrent, in full remission: Secondary | ICD-10-CM | POA: Diagnosis not present

## 2018-06-22 DIAGNOSIS — I10 Essential (primary) hypertension: Secondary | ICD-10-CM | POA: Diagnosis not present

## 2018-06-22 DIAGNOSIS — E1169 Type 2 diabetes mellitus with other specified complication: Secondary | ICD-10-CM | POA: Diagnosis not present

## 2018-06-22 DIAGNOSIS — E785 Hyperlipidemia, unspecified: Secondary | ICD-10-CM | POA: Diagnosis not present

## 2018-06-22 DIAGNOSIS — Z0000 Encounter for general adult medical examination without abnormal findings: Secondary | ICD-10-CM | POA: Diagnosis not present

## 2018-06-23 ENCOUNTER — Encounter: Payer: Self-pay | Admitting: Family Medicine

## 2018-06-23 LAB — COMPLETE METABOLIC PANEL WITH GFR
AG Ratio: 2 (calc) (ref 1.0–2.5)
ALT: 18 U/L (ref 9–46)
AST: 17 U/L (ref 10–40)
Albumin: 4.4 g/dL (ref 3.6–5.1)
Alkaline phosphatase (APISO): 62 U/L (ref 40–115)
BILIRUBIN TOTAL: 0.3 mg/dL (ref 0.2–1.2)
BUN: 18 mg/dL (ref 7–25)
CO2: 28 mmol/L (ref 20–32)
CREATININE: 0.99 mg/dL (ref 0.60–1.35)
Calcium: 9.4 mg/dL (ref 8.6–10.3)
Chloride: 105 mmol/L (ref 98–110)
GFR, Est African American: 107 mL/min/{1.73_m2} (ref >=60)
GFR, Est Non African American: 92 mL/min/{1.73_m2} (ref >=60)
GLUCOSE: 112 mg/dL — AB (ref 65–99)
Globulin: 2.2 g/dL (calc) (ref 1.9–3.7)
Potassium: 4.3 mmol/L (ref 3.5–5.3)
Sodium: 142 mmol/L (ref 135–146)
Total Protein: 6.6 g/dL (ref 6.1–8.1)

## 2018-06-23 LAB — CBC WITH DIFFERENTIAL/PLATELET
BASOS PCT: 0.8 %
Basophils Absolute: 50 cells/uL (ref 0–200)
EOS PCT: 5.8 %
Eosinophils Absolute: 360 cells/uL (ref 15–500)
HEMATOCRIT: 41.1 % (ref 38.5–50.0)
HEMOGLOBIN: 13.9 g/dL (ref 13.2–17.1)
LYMPHS ABS: 1358 {cells}/uL (ref 850–3900)
MCH: 27.9 pg (ref 27.0–33.0)
MCHC: 33.8 g/dL (ref 32.0–36.0)
MCV: 82.5 fL (ref 80.0–100.0)
MPV: 11.2 fL (ref 7.5–12.5)
Monocytes Relative: 7.5 %
NEUTROS ABS: 3968 {cells}/uL (ref 1500–7800)
Neutrophils Relative %: 64 %
Platelets: 249 10*3/uL (ref 140–400)
RBC: 4.98 10*6/uL (ref 4.20–5.80)
RDW: 13.2 % (ref 11.0–15.0)
Total Lymphocyte: 21.9 %
WBC: 6.2 10*3/uL (ref 3.8–10.8)
WBCMIX: 465 {cells}/uL (ref 200–950)

## 2018-06-23 LAB — LIPID PANEL
Cholesterol: 182 mg/dL (ref <200)
HDL: 65 mg/dL (ref >40)
LDL CHOLESTEROL (CALC): 100 mg/dL — AB
NON-HDL CHOLESTEROL (CALC): 117 mg/dL (ref <130)
TRIGLYCERIDES: 82 mg/dL (ref <150)
Total CHOL/HDL Ratio: 2.8 (calc) (ref <5.0)

## 2018-06-23 LAB — HEMOGLOBIN A1C
EAG (MMOL/L): 7.4 (calc)
Hgb A1c MFr Bld: 6.3 % of total Hgb — ABNORMAL HIGH (ref <5.7)
Mean Plasma Glucose: 134 (calc)

## 2018-06-24 ENCOUNTER — Ambulatory Visit (INDEPENDENT_AMBULATORY_CARE_PROVIDER_SITE_OTHER): Payer: 59 | Admitting: Physician Assistant

## 2018-06-24 ENCOUNTER — Encounter (INDEPENDENT_AMBULATORY_CARE_PROVIDER_SITE_OTHER): Payer: Self-pay | Admitting: Physician Assistant

## 2018-06-24 VITALS — BP 136/85 | HR 81 | Temp 97.9°F | Ht 73.0 in | Wt 245.0 lb

## 2018-06-24 DIAGNOSIS — E119 Type 2 diabetes mellitus without complications: Secondary | ICD-10-CM | POA: Diagnosis not present

## 2018-06-24 DIAGNOSIS — Z6832 Body mass index (BMI) 32.0-32.9, adult: Secondary | ICD-10-CM

## 2018-06-24 DIAGNOSIS — E669 Obesity, unspecified: Secondary | ICD-10-CM | POA: Diagnosis not present

## 2018-06-24 NOTE — Progress Notes (Signed)
Office: [phone]  /  Fax: [phone]   HPI:   Chief Complaint: OBESITY [name] is here to discuss his progress with his obesity treatment plan. He is on the Category 4 plan and is following his eating plan approximately 75 % of the time. He states he is walking 30 minutes 4 times per week. [name] struggled to follow the plan due to traveling and eating out more at [employer]. He is ready to get back on track. His weight is 245 lb (111.1 kg) today and has not lost weight since his last visit. He has lost 4 lbs since starting treatment with [name].  Diabetes II [name] has a diagnosis of diabetes type II. [name] states his fasting BGs range between 100 and 119 and he denies any hypoglycemic episodes. Last A1c was at 6.3 He has been working on intensive lifestyle modifications including diet, exercise, and weight loss to help control his blood glucose levels.  ALLERGIES: Allergies  Allergen Reactions  . Tomato Other (See Comments)    GI distress    MEDICATIONS: Current Outpatient Medications on File Prior to Visit  Medication Sig Dispense Refill  . buPROPion (WELLBUTRIN XL) 150 MG 24 hr tablet Take 150 mg by mouth daily.   12  . DULoxetine (CYMBALTA) 60 MG capsule Take 60 mg by mouth daily.     [name] esomeprazole (NEXIUM) 40 MG capsule Take 1 capsule (40 mg total) every morning by mouth. 90 capsule 3  . etodolac (LODINE) 500 MG tablet TAKE 1 TABLET (500 MG TOTAL) BY MOUTH 2 (TWO) TIMES DAILY. (Patient taking differently: Take 500 mg by mouth 2 (two) times daily as needed (pain). ) 60 tablet 2  . Ketorolac Tromethamine (SPRIX) 15.75 MG/SPRAY SOLN 1 spray each nostril q 6-8 hours prn pain 5 each 0  . metFORMIN (GLUCOPHAGE) 1000 MG tablet Take 1 tablet (1,000 mg total) by mouth 2 (two) times daily with a meal. 60 tablet 0  . Multiple Vitamin (MULTIVITAMIN) capsule Take 1 capsule by mouth daily.    . ondansetron (ZOFRAN-ODT) 8 MG disintegrating tablet Take 1 tablet (8 mg total) by mouth  every 8 (eight) hours as needed for nausea or vomiting. 20 tablet 0  . oxyCODONE-acetaminophen (PERCOCET) 5-325 MG tablet Take 1-2 tablets by mouth every 6 (six) hours as needed for severe pain. 30 tablet 0  . tamsulosin (FLOMAX) 0.4 MG CAPS capsule Take 1 capsule (0.4 mg total) by mouth daily. 10 capsule 0  . tamsulosin (FLOMAX) 0.4 MG CAPS capsule Take 1 capsule (0.4 mg total) by mouth daily after supper. 14 capsule 0  . valsartan-hydrochlorothiazide (DIOVAN-HCT) 160-12.5 MG tablet Take 1 tablet by mouth every morning. 90 tablet 3  . Vitamin D, Ergocalciferol, (DRISDOL) 50000 units CAPS capsule Take 1 capsule (50,000 Units total) by mouth every 7 (seven) days. 4 capsule 0   No current facility-administered medications on file prior to visit.     PAST MEDICAL HISTORY: Past Medical History:  Diagnosis Date  . Chest pain   . Depression   . Family history of adverse reaction to anesthesia    MOM-N/V, DAD-HAS PROBLEMS WITH NOVICAINE  . Food allergy   . GERD (gastroesophageal reflux disease)   . History of hiatal hernia   . History of kidney stones    H/O  . Hypertension   . Insomnia   . Joint pain   . Plantar fasciitis   . Seasonal allergies   . Tendonitis of foot    left  PAST SURGICAL HISTORY: Past Surgical History:  Procedure Laterality Date  . CARPAL TUNNEL RELEASE Right 07/03/2017   Procedure: CARPAL TUNNEL RELEASE;  Surgeon: Hessie Knows, MD;  Location: [facility];  Service: Orthopedics;  Laterality: Right;  . clavide surgery  2005  . CYSTOSCOPY/URETEROSCOPY/HOLMIUM LASER/STENT PLACEMENT Right 05/22/2018   Procedure: CYSTOSCOPY/URETEROSCOPY/HOLMIUM LASER/STENT PLACEMENT;  Surgeon: Billey Co, MD;  Location: [facility];  Service: Urology;  Laterality: Right;  . KNEE SURGERY Right 2013  . SHOULDER SURGERY Right 2008    SOCIAL HISTORY: Social History   Tobacco Use  . Smoking status: Never Smoker  . Smokeless tobacco: Never Used  Substance Use Topics  . Alcohol  use: Yes    Alcohol/week: 0.0 standard drinks    Comment: RARE  . Drug use: No    FAMILY HISTORY: Family History  Problem Relation Age of Onset  . Cancer Mother        colon/rectal  . Hyperlipidemia Mother   . Cancer Brother        hodgekins  . Cancer Maternal Grandfather        lung  . Stroke Maternal Grandfather   . Diabetes Father   . Hypertension Father   . Hyperlipidemia Father   . Heart disease Father   . Alcoholism Father     ROS: Review of Systems  Constitutional: Negative for weight loss.  Endo/Heme/Allergies:       Negative for hypoglycemia    PHYSICAL EXAM: Blood pressure 136/85, pulse 81, temperature 97.9 F (36.6 C), temperature source Oral, height 6\' 1"  (1.854 m), weight 245 lb (111.1 kg), SpO2 100 %. Body mass index is 32.32 kg/m. Physical Exam  Constitutional: He is oriented to person, place, and time. He appears well-developed and well-nourished.  Cardiovascular: Normal rate.  Pulmonary/Chest: Effort normal.  Musculoskeletal: Normal range of motion.  Neurological: He is oriented to person, place, and time.  Skin: Skin is warm and dry.  Psychiatric: He has a normal mood and affect. His behavior is normal.  Vitals reviewed.   RECENT LABS AND TESTS: BMET    Component Value Date/Time   NA 142 06/22/2018 0816   NA 138 01/28/2018 1007   NA 138 05/20/2013 0815   K 4.3 06/22/2018 0816   K 3.7 05/20/2013 0815   CL 105 06/22/2018 0816   CL 106 05/20/2013 0815   CO2 28 06/22/2018 0816   CO2 26 05/20/2013 0815   GLUCOSE 112 (H) 06/22/2018 0816   GLUCOSE 101 (H) 05/20/2013 0815   BUN 18 06/22/2018 0816   BUN 18 01/28/2018 1007   BUN 20 (H) 05/20/2013 0815   CREATININE 0.99 06/22/2018 0816   CALCIUM 9.4 06/22/2018 0816   CALCIUM 8.9 05/20/2013 0815   GFRNONAA 92 06/22/2018 0816   GFRAA 107 06/22/2018 0816   Lab Results  Component Value Date   HGBA1C 6.3 (H) 06/22/2018   HGBA1C 6.2 (H) 01/28/2018   HGBA1C 6.7 11/07/2017   HGBA1C 6.3  05/08/2017   HGBA1C 6.5 11/06/2016   Lab Results  Component Value Date   INSULIN 11.6 05/13/2018   INSULIN 10.5 01/28/2018   CBC    Component Value Date/Time   WBC 6.2 06/22/2018 0816   RBC 4.98 06/22/2018 0816   HGB 13.9 06/22/2018 0816   HGB 14.6 01/28/2018 1007   HCT 41.1 06/22/2018 0816   HCT 43.5 01/28/2018 1007   PLT 249 06/22/2018 0816   PLT 250 06/23/2017 1104   MCV 82.5 06/22/2018 0816   MCV 84 01/28/2018 1007  MCV 84 11/05/2011 1606   MCH 27.9 06/22/2018 0816   MCHC 33.8 06/22/2018 0816   RDW 13.2 06/22/2018 0816   RDW 14.3 01/28/2018 1007   RDW 13.0 11/05/2011 1606   LYMPHSABS 1,358 06/22/2018 0816   LYMPHSABS 1.5 01/28/2018 1007   LYMPHSABS 1.8 11/05/2011 1606   MONOABS 0.9 04/12/2018 1820   MONOABS 0.6 11/05/2011 1606   EOSABS 360 06/22/2018 0816   EOSABS 0.2 01/28/2018 1007   EOSABS 0.1 11/05/2011 1606   BASOSABS 50 06/22/2018 0816   BASOSABS 0.0 01/28/2018 1007   BASOSABS 0.0 11/05/2011 1606   Iron/TIBC/Ferritin/ %Sat No results found for: IRON, TIBC, FERRITIN, IRONPCTSAT Lipid Panel     Component Value Date/Time   CHOL 182 06/22/2018 0816   CHOL 175 05/13/2018 0845   TRIG 82 06/22/2018 0816   HDL 65 06/22/2018 0816   HDL 51 05/13/2018 0845   CHOLHDL 2.8 06/22/2018 0816   LDLCALC 100 (H) 06/22/2018 0816   Hepatic Function Panel     Component Value Date/Time   PROT 6.6 06/22/2018 0816   PROT 7.0 01/28/2018 1007   PROT 8.5 (H) 11/05/2011 1606   ALBUMIN 4.6 04/12/2018 1820   ALBUMIN 4.4 01/28/2018 1007   ALBUMIN 4.5 11/05/2011 1606   AST 17 06/22/2018 0816   AST 36 11/05/2011 1606   ALT 18 06/22/2018 0816   ALT 53 11/05/2011 1606   ALKPHOS 63 04/12/2018 1820   ALKPHOS 147 (H) 11/05/2011 1606   BILITOT 0.3 06/22/2018 0816   BILITOT 0.3 01/28/2018 1007   BILITOT 0.5 11/05/2011 1606      Component Value Date/Time   TSH 1.330 01/28/2018 1007   TSH 1.440 01/04/2016 0818   Results for Clontz, Timm C "NED" (MRN [id number]) as of  06/24/2018 16:51  Ref. Range 05/13/2018 08:45  Vitamin D, 25-Hydroxy Latest Ref Range: 30.0 - 100.0 ng/mL 37.8   ASSESSMENT AND PLAN: Type 2 diabetes mellitus without complication, without long-term current use of insulin (HCC)  Class 1 obesity with serious comorbidity and body mass index (BMI) of 32.0 to 32.9 in adult, unspecified obesity type  PLAN:  Diabetes II Johsua has been given extensive diabetes education by myself today including ideal fasting and post-prandial blood glucose readings, individual ideal Hgb A1c goals and hypoglycemia prevention. We discussed the importance of good blood sugar control to decrease the likelihood of diabetic complications such as nephropathy, neuropathy, limb loss, blindness, coronary artery disease, and death. We discussed the importance of intensive lifestyle modification including diet, exercise and weight loss as the first line treatment for diabetes. Jona agrees to continue with diet, exercise and weight loss and will follow up at the agreed upon time.  I spent > than 50% of the 15 minute visit on counseling as documented in the note.  Obesity Hurshell is currently in the action stage of change. As such, his goal is to continue with weight loss efforts He has agreed to follow the Category 4 plan Artemus has been instructed to work up to a goal of 150 minutes of combined cardio and strengthening exercise per week for weight loss and overall health benefits. We discussed the following Behavioral Modification Strategies today: increasing lean protein intake, decreasing simple carbohydrates  and decrease eating out  Ulice has agreed to follow up with our clinic in 3 weeks. He was informed of the importance of frequent follow up visits to maximize his success with intensive lifestyle modifications for his multiple health conditions.   OBESITY BEHAVIORAL INTERVENTION VISIT  Today's  visit was # 8   Starting weight: 249 lbs Starting date:  01/28/18 Today's weight : 245 lbs Today's date: 06/24/2018 Total lbs lost to date: 4 At least 15 minutes were spent on discussing the following behavioral intervention visit.   ASK: We discussed the diagnosis of obesity with [place] today and Kollyn agreed to give [name] permission to discuss obesity behavioral modification therapy today.  ASSESS: Shamon has the diagnosis of obesity and his BMI today is 32.33 Herschell is in the action stage of change   ADVISE: Antowan was educated on the multiple health risks of obesity as well as the benefit of weight loss to improve his health. He was advised of the need for long term treatment and the importance of lifestyle modifications to improve his current health and to decrease his risk of future health problems.  AGREE: Multiple dietary modification options and treatment options were discussed and  Rourke agreed to follow the recommendations documented in the above note.  ARRANGE: Torie was educated on the importance of frequent visits to treat obesity as outlined per CMS and USPSTF guidelines and agreed to schedule his next follow up appointment today.  Corey Skains, am acting as transcriptionist for Abby Potash, PA=C I, Abby Potash, PA-C have reviewed above note and agree with its content

## 2018-06-25 ENCOUNTER — Other Ambulatory Visit: Payer: 59

## 2018-06-26 ENCOUNTER — Encounter: Payer: 59 | Admitting: Family Medicine

## 2018-07-02 ENCOUNTER — Encounter: Payer: Self-pay | Admitting: Family Medicine

## 2018-07-02 ENCOUNTER — Ambulatory Visit (INDEPENDENT_AMBULATORY_CARE_PROVIDER_SITE_OTHER): Payer: 59 | Admitting: Family Medicine

## 2018-07-02 ENCOUNTER — Other Ambulatory Visit: Payer: Self-pay | Admitting: Family Medicine

## 2018-07-02 VITALS — BP 118/84 | HR 72 | Temp 98.4°F | Resp 16 | Ht 73.0 in | Wt 250.0 lb

## 2018-07-02 DIAGNOSIS — F3342 Major depressive disorder, recurrent, in full remission: Secondary | ICD-10-CM

## 2018-07-02 DIAGNOSIS — F338 Other recurrent depressive disorders: Secondary | ICD-10-CM

## 2018-07-02 DIAGNOSIS — M159 Polyosteoarthritis, unspecified: Secondary | ICD-10-CM

## 2018-07-02 DIAGNOSIS — E785 Hyperlipidemia, unspecified: Secondary | ICD-10-CM | POA: Diagnosis not present

## 2018-07-02 DIAGNOSIS — E1169 Type 2 diabetes mellitus with other specified complication: Secondary | ICD-10-CM

## 2018-07-02 DIAGNOSIS — Z23 Encounter for immunization: Secondary | ICD-10-CM | POA: Diagnosis not present

## 2018-07-02 DIAGNOSIS — Z0000 Encounter for general adult medical examination without abnormal findings: Secondary | ICD-10-CM | POA: Diagnosis not present

## 2018-07-02 DIAGNOSIS — I10 Essential (primary) hypertension: Secondary | ICD-10-CM

## 2018-07-02 DIAGNOSIS — K219 Gastro-esophageal reflux disease without esophagitis: Secondary | ICD-10-CM

## 2018-07-02 DIAGNOSIS — M150 Primary generalized (osteo)arthritis: Secondary | ICD-10-CM

## 2018-07-02 NOTE — Assessment & Plan Note (Signed)
Well-controlled DM with S4P 6.3 Complicated with HLD, HTN, GERD Followed by [facility] Endocrinology Dr Gabriel Carina  Plan:  1. Continue current therapy - Metformin XR 2000mg  daily 2. Encourage improved lifestyle - low carb, low sugar diet, reduce portion size, continue improving regular exercise 3. Continue f/u Endocrinology - next due DM Eye Exam 01/2019 - [place]

## 2018-07-02 NOTE — Assessment & Plan Note (Addendum)
Controlled HTN, improved on re-check Home readings normal No known complications    Plan:  1. Continue current BP regimen - Valsartan-HCTZ 160-12.5mg  daily 2. Encourage improved lifestyle - low sodium diet, regular exercise 3. May continue monitor BP outside office, bring readings to next visit, if persistently >140/90 or new symptoms notify office sooner 4. Follow-up 1 yr - annual / labs

## 2018-07-02 NOTE — Assessment & Plan Note (Signed)
Controlled cholesterol on lifestyle Last lipid panel 06/2018 Calculated ASCVD 10 yr risk score 3.6%, low risk despite DM  Plan: 1. Discussion on reducing ASCVD risk as diabetic - defer additional rx meds for now = reconsider near age 44+ 2. Encourage improved lifestyle - low carb/cholesterol, reduce portion size, continue improving regular exercise 3. Follow-up q 1 yr lipids

## 2018-07-02 NOTE — Assessment & Plan Note (Signed)
See A&P for depression Suspected factor contributing to his mood as well Continue treatment and Psych 

## 2018-07-02 NOTE — Progress Notes (Signed)
Subjective:    Patient ID: [name], male    DOB: [birth year], 42 y.o.   MRN: [id number]  [name] is a 42 y.o. male presenting on 07/02/2018 for Annual Exam   HPI   Here for Annual Physical and Lab Review.  CHRONIC DM, Type 2: Followed by [facility] Endocrinology Dr Gabriel Carina q 6 months. Last A1c recent labs 6.3 Meds:Metformin 2000mg  XR daily at bedtime Reports good compliance. Tolerating well w/o side-effects Currently on ARB Lifestyle: - Diet (Continues on improved DM diet) - Exercise (increasing exercise) Denies hypoglycemia  CHRONIC HTN: Reports home BP readings have been normal, recently slightly elevated, attributed to this morning. Current Meds - Valsartan-HCTZ 160-12.5mg  daily in AM Reports good compliance, took meds today. Tolerating well, w/o complaints.  History of Depression, now in remission / Possible Seasonal Affective Disorder - Reports chronic problem has episodic flares usually related to life stressors and winter months - Followed by [facility] Psychiatry once yearly- Dr Chucky May, has been on Fluoxetine 20mg  daily, and also has been on Wellbutrin XL 150mg  in addition for past 1 year - Reports he is doing well overall, currently depression is controlled, he feels like it is in remission overall  Additional complaint - History of L foot, heel spur, podiatry - Dr Vickki Muff, he has concerns with chronic recurrent pain, and will follow-up as planned w/ podiatry, they recommended possible foot surgery if needed in future. He is not sure if he wants to take time for recovery  History of nephrolithiasis - s/p ureteroscopy removal of stone.  Health Maintenance: Due for Flu Shot, will receive today   Declines Pneumonia vaccine 23 for initial dose as diabetic before age 89.  Depression screen [facility] 2/9 07/02/2018 01/28/2018 12/23/2017  Decreased Interest 0 2 0  Down, Depressed, Hopeless 0 3 0  PHQ - 2 Score 0 5 0  Altered sleeping 0 2 0  Tired, decreased  energy 0 3 1  Change in appetite 0 2 1  Feeling bad or failure about yourself  0 3 0  Trouble concentrating 0 1 0  Moving slowly or fidgety/restless 0 1 0  Suicidal thoughts 0 2 0  PHQ-9 Score 0 19 2  Difficult doing work/chores Not difficult at all Somewhat difficult Not difficult at all   GAD 7 : Generalized Anxiety Score 07/02/2018  Nervous, Anxious, on Edge 0  Control/stop worrying 0  Worry too much - different things 0  Trouble relaxing 0  Restless 0  Easily annoyed or irritable 0  Afraid - awful might happen 0  Total GAD 7 Score 0  Anxiety Difficulty Not difficult at all    Past Medical History:  Diagnosis Date  . Chest pain   . Family history of adverse reaction to anesthesia    MOM-N/V, DAD-HAS PROBLEMS WITH NOVICAINE  . Food allergy   . GERD (gastroesophageal reflux disease)   . History of hiatal hernia   . History of kidney stones    H/O  . Insomnia   . Joint pain   . Plantar fasciitis   . Seasonal allergies   . Tendonitis of foot    left   Past Surgical History:  Procedure Laterality Date  . CARPAL TUNNEL RELEASE Right 07/03/2017   Procedure: CARPAL TUNNEL RELEASE;  Surgeon: Hessie Knows, MD;  Location: [facility] ORS;  Service: Orthopedics;  Laterality: Right;  . clavide surgery  2005  . CYSTOSCOPY/URETEROSCOPY/HOLMIUM LASER/STENT PLACEMENT Right 05/22/2018   Procedure: CYSTOSCOPY/URETEROSCOPY/HOLMIUM LASER/STENT PLACEMENT;  Surgeon: Diamantina Providence,  Herbert Seta, MD;  Location: [facility] ORS;  Service: Urology;  Laterality: Right;  . KNEE SURGERY Right 2013  . SHOULDER SURGERY Right 2008   Social History   Socioeconomic History  . Marital status: Married    Spouse name: Amy Tidd  . Number of children: 1  . Years of education: Not on file  . Highest education level: Not on file  Occupational History  . 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 Financial resource strain: Not on file  . Food insecurity:     Worry: Not on file    Inability: Not on file  . Transportation needs:    Medical: Not on file    Non-medical: Not on file  Tobacco Use  . Smoking status: Never Smoker  . Smokeless tobacco: Never Used  Substance and Sexual Activity  . Alcohol use: Yes    Alcohol/week: 0.0 standard drinks    Comment: RARE  . Drug use: No  . Sexual activity: Yes  Lifestyle  . Physical activity:    Days per week: Not on file    Minutes per session: Not on file  . Stress: Not on file  Relationships  . Social connections:    Talks on phone: Not on file    Gets together: Not on file    Attends religious service: Not on file    Active member of club or organization: Not on file    Attends meetings of clubs or organizations: Not on file    Relationship status: Not on file  . Intimate partner violence:    Fear of current or ex partner: Not on file    Emotionally abused: Not on file    Physically abused: Not on file    Forced sexual activity: Not on file  Other Topics Concern  . Not on file  Social History Narrative  . Not on file   Family History  Problem Relation Age of Onset  . Cancer Mother        colon/rectal  . Hyperlipidemia Mother   . Cancer Brother        hodgekins  . Cancer Maternal Grandfather        lung  . Stroke Maternal Grandfather   . Diabetes Father   . Hypertension Father   . Hyperlipidemia Father   . Heart disease Father   . Alcoholism Father    Current Outpatient Medications on File Prior to Visit  Medication Sig  . buPROPion (WELLBUTRIN XL) 150 MG 24 hr tablet Take 150 mg by mouth daily.   . DULoxetine (CYMBALTA) 60 MG capsule Take 60 mg by mouth daily.   [name] esomeprazole (NEXIUM) 40 MG capsule Take 1 capsule (40 mg total) every morning by mouth.  . etodolac (LODINE) 500 MG tablet TAKE 1 TABLET (500 MG TOTAL) BY MOUTH 2 (TWO) TIMES DAILY. (Patient taking differently: Take 500 mg by mouth 2 (two) times daily as needed (pain). )  . metFORMIN (GLUCOPHAGE) 1000 MG tablet  Take 1 tablet (1,000 mg total) by mouth 2 (two) times daily with a meal.  . Multiple Vitamin (MULTIVITAMIN) capsule Take 1 capsule by mouth daily.  . valsartan-hydrochlorothiazide (DIOVAN-HCT) 160-12.5 MG tablet Take 1 tablet by mouth every morning.  . Vitamin D, Ergocalciferol, (DRISDOL) 50000 units CAPS capsule Take 1 capsule (50,000 Units total) by mouth every 7 (seven) days.   No current facility-administered medications on file prior to visit.  Review of Systems  Constitutional: Negative for activity change, appetite change, chills, diaphoresis, fatigue and fever.  HENT: Negative for congestion and hearing loss.   Eyes: Negative for visual disturbance.  Respiratory: Negative for apnea, cough, choking, chest tightness, shortness of breath and wheezing.   Cardiovascular: Negative for chest pain, palpitations and leg swelling.  Gastrointestinal: Negative for abdominal pain, constipation, diarrhea, nausea and vomiting.  Endocrine: Negative for cold intolerance.  Genitourinary: Negative for difficulty urinating, dysuria, frequency and hematuria.  Musculoskeletal: Negative for arthralgias and neck pain.  Skin: Negative for rash.  Allergic/Immunologic: Negative for environmental allergies.  Neurological: Negative for dizziness, weakness, light-headedness, numbness and headaches.  Hematological: Negative for adenopathy.  Psychiatric/Behavioral: Negative for behavioral problems, dysphoric mood and sleep disturbance.   Per HPI unless specifically indicated above     Objective:    BP 118/84 (BP Location: Left Arm, Cuff Size: Normal)   Pulse 72   Temp 98.4 F (36.9 C)   Resp 16   Ht 6\' 1"  (1.854 m)   Wt 250 lb (113.4 kg)   BMI 32.98 kg/m   Wt Readings from Last 3 Encounters:  07/02/18 250 lb (113.4 kg)  06/24/18 245 lb (111.1 kg)  06/03/18 240 lb (108.9 kg)    Physical Exam  Constitutional: He is oriented to person, place, and time. He appears well-developed and  well-nourished. No distress.  Well-appearing, comfortable, cooperative  HENT:  Head: Normocephalic and atraumatic.  Mouth/Throat: Oropharynx is clear and moist.  Frontal / maxillary sinuses non-tender. Nares patent without purulence or edema. Bilateral TMs clear without erythema, effusion or bulging. Oropharynx clear without erythema, exudates, edema or asymmetry.  Eyes: Pupils are equal, round, and reactive to light. Conjunctivae and EOM are normal. Right eye exhibits no discharge. Left eye exhibits no discharge.  Neck: Normal range of motion. Neck supple. No thyromegaly present.  Cardiovascular: Normal rate, regular rhythm, normal heart sounds and intact distal pulses.  No murmur heard. Pulmonary/Chest: Effort normal and breath sounds normal. No respiratory distress. He has no wheezes. He has no rales.  Abdominal: Soft. Bowel sounds are normal. He exhibits no distension and no mass. There is no tenderness.  Musculoskeletal: Normal range of motion. He exhibits no edema or tenderness.  Upper / Lower Extremities: - Normal muscle tone, strength bilateral upper extremities 5/5, lower extremities 5/5  Lymphadenopathy:    He has no cervical adenopathy.  Neurological: He is alert and oriented to person, place, and time.  Distal sensation intact to light touch all extremities  Skin: Skin is warm and dry. No rash noted. He is not diaphoretic. No erythema.  Psychiatric: He has a normal mood and affect. His behavior is normal.  Well groomed, good eye contact, normal speech and thoughts  Nursing note and vitals reviewed.    Diabetic Foot Exam - Simple   Simple Foot Form Diabetic Foot exam was performed with the following findings:  Yes 07/02/2018  9:35 AM  Visual Inspection See comments:  Yes Sensation Testing Intact to touch and monofilament testing bilaterally:  Yes Pulse Check Posterior Tibialis and Dorsalis pulse intact bilaterally:  Yes Comments Bilateral heel callus early formation.  Intact monofilament sensation.     Results for orders placed or performed in visit on 06/25/18  Lipid panel  Result Value Ref Range   Cholesterol 182 <200 mg/dL   HDL 65 >40 mg/dL   Triglycerides 82 <150 mg/dL   LDL Cholesterol (Calc) 100 (H) mg/dL (calc)   Total CHOL/HDL Ratio 2.8 <5.0 (calc)  Non-HDL Cholesterol (Calc) 117 <130 mg/dL (calc)  COMPLETE METABOLIC PANEL WITH GFR  Result Value Ref Range   Glucose, Bld 112 (H) 65 - 99 mg/dL   BUN 18 7 - 25 mg/dL   Creat 0.99 0.60 - 1.35 mg/dL   GFR, Est Non African American 92 > OR = 60 mL/min/1.8m2   GFR, Est African American 107 > OR = 60 mL/min/1.47m2   BUN/Creatinine Ratio NOT APPLICABLE 6 - 22 (calc)   Sodium 142 135 - 146 mmol/L   Potassium 4.3 3.5 - 5.3 mmol/L   Chloride 105 98 - 110 mmol/L   CO2 28 20 - 32 mmol/L   Calcium 9.4 8.6 - 10.3 mg/dL   Total Protein 6.6 6.1 - 8.1 g/dL   Albumin 4.4 3.6 - 5.1 g/dL   Globulin 2.2 1.9 - 3.7 g/dL (calc)   AG Ratio 2.0 1.0 - 2.5 (calc)   Total Bilirubin 0.3 0.2 - 1.2 mg/dL   Alkaline phosphatase (APISO) 62 40 - 115 U/L   AST 17 10 - 40 U/L   ALT 18 9 - 46 U/L  CBC with Differential/Platelet  Result Value Ref Range   WBC 6.2 3.8 - 10.8 Thousand/uL   RBC 4.98 4.20 - 5.80 Million/uL   Hemoglobin 13.9 13.2 - 17.1 g/dL   HCT 41.1 38.5 - 50.0 %   MCV 82.5 80.0 - 100.0 fL   MCH 27.9 27.0 - 33.0 pg   MCHC 33.8 32.0 - 36.0 g/dL   RDW 13.2 11.0 - 15.0 %   Platelets 249 140 - 400 Thousand/uL   MPV 11.2 7.5 - 12.5 fL   Neutro Abs 3,968 1,500 - 7,800 cells/uL   Lymphs Abs 1,358 850 - 3,900 cells/uL   WBC mixed population 465 200 - 950 cells/uL   Eosinophils Absolute 360 15 - 500 cells/uL   Basophils Absolute 50 0 - 200 cells/uL   Neutrophils Relative % 64 %   Total Lymphocyte 21.9 %   Monocytes Relative 7.5 %   Eosinophils Relative 5.8 %   Basophils Relative 0.8 %  Hemoglobin A1c  Result Value Ref Range   Hgb A1c MFr Bld 6.3 (H) <5.7 % of total Hgb   Mean Plasma Glucose 134  (calc)   eAG (mmol/L) 7.4 (calc)      Assessment & Plan:   Problem List Items Addressed This Visit    Depression, major, recurrent, in complete remission (HCC)    Stable, controlled. Seems currently in remission. Suspected factor with Seasonal Affective Disorder Followed by Psychiatry Dr Toy Care in [facility] Continue Fluoxetine 20mg  daily, Wellbutrin XL 150mg  daily      Essential hypertension    Controlled HTN, improved on re-check Home readings normal No known complications    Plan:  1. Continue current BP regimen - Valsartan-HCTZ 160-12.5mg  daily 2. Encourage improved lifestyle - low sodium diet, regular exercise 3. May continue monitor BP outside office, bring readings to next visit, if persistently >140/90 or new symptoms notify office sooner 4. Follow-up 1 yr - annual / labs      Hyperlipidemia associated with type 2 diabetes mellitus ([place])    Controlled cholesterol on lifestyle Last lipid panel 06/2018 Calculated ASCVD 10 yr risk score 3.6%, low risk despite DM  Plan: 1. Discussion on reducing ASCVD risk as diabetic - defer additional rx meds for now = reconsider near age 20+ 2. Encourage improved lifestyle - low carb/cholesterol, reduce portion size, continue improving regular exercise 3. 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 Continue current therapy - Metformin XR 2000mg  daily 2. Encourage improved lifestyle - low carb, low sugar diet, reduce portion size, continue improving regular exercise 3. Continue f/u Endocrinology - next due DM Eye Exam 01/2019 - [place]       Other Visit Diagnoses    Annual physical exam    -  Primary  Updated  Health Maintenance information Reviewed recent lab results with patient Encouraged improvement to lifestyle with diet and exercise - Goal of weight loss    Needs flu shot       Relevant Orders   Flu Vaccine QUAD 36+ mos IM (Completed)      No orders of the defined types were placed in this encounter.   Follow up plan: Return in about 1 year (around 07/03/2019) for Annual Physical.   Future labs ordered for 06/28/19  [name], [facility] 07/02/2018, 4:35 PM

## 2018-07-02 NOTE — Patient Instructions (Addendum)
Thank you for coming to the office today.  Flu Shot today  Future Cholesterol medicine Statin, low dose for prevention if interested, can aim for around age 42 if you prefer, and if cholesterol remains controlled.  A1c sugar is well controlled still on last lab.  I prefer to recommend more conservative therapy for foot / heel spur, and maximize this with your podiatry but in future if need procedure, may consider it as discussed  Think about Foot/Ankle Ortho specialist - in [place] at [facility] D. Doran Durand, MD  Address: [id number], [place], [place] [address]  Phone: [phone]  DUE for FASTING BLOOD WORK (no food or drink after midnight before the lab appointment, only water or coffee without cream/sugar on the morning of)  SCHEDULE "Lab Only" visit in the morning at the clinic for lab draw in 1 YEAR  - Make sure Lab Only appointment is at about 1 week before your next appointment, so that results will be available  For Lab Results, once available within 2-3 days of blood draw, you can can log in to MyChart online to view your results and a brief explanation. Also, we can discuss results at next follow-up visit.   Please schedule a Follow-up Appointment to: Return in about 1 year (around 07/03/2019) for Annual Physical.  If you have any other questions or concerns, please feel free to call the office or send a message through [place]. You may also schedule an earlier appointment if necessary.  Additionally, you may be receiving a survey about your experience at our office within a few days to 1 week by e-mail or mail. We value your feedback.  [name], DO [place]

## 2018-07-02 NOTE — Assessment & Plan Note (Signed)
Stable, controlled. Seems currently in remission. Suspected factor with Seasonal Affective Disorder Followed by Psychiatry Dr Toy Care in [place] Continue Fluoxetine 20mg  daily, Wellbutrin XL 150mg  daily

## 2018-07-10 ENCOUNTER — Other Ambulatory Visit (INDEPENDENT_AMBULATORY_CARE_PROVIDER_SITE_OTHER): Payer: Self-pay | Admitting: Physician Assistant

## 2018-07-10 DIAGNOSIS — E559 Vitamin D deficiency, unspecified: Secondary | ICD-10-CM

## 2018-07-13 ENCOUNTER — Other Ambulatory Visit (INDEPENDENT_AMBULATORY_CARE_PROVIDER_SITE_OTHER): Payer: Self-pay | Admitting: Family Medicine

## 2018-07-13 ENCOUNTER — Other Ambulatory Visit (INDEPENDENT_AMBULATORY_CARE_PROVIDER_SITE_OTHER): Payer: Self-pay | Admitting: Physician Assistant

## 2018-07-13 ENCOUNTER — Other Ambulatory Visit (INDEPENDENT_AMBULATORY_CARE_PROVIDER_SITE_OTHER): Payer: Self-pay

## 2018-07-13 DIAGNOSIS — E119 Type 2 diabetes mellitus without complications: Secondary | ICD-10-CM

## 2018-07-13 DIAGNOSIS — E559 Vitamin D deficiency, unspecified: Secondary | ICD-10-CM

## 2018-07-13 MED ORDER — METFORMIN HCL 1000 MG PO TABS: 1000.0000 mg | ORAL_TABLET | Freq: Two times a day (BID) | ORAL | 0 refills | Status: DC

## 2018-07-15 ENCOUNTER — Ambulatory Visit (INDEPENDENT_AMBULATORY_CARE_PROVIDER_SITE_OTHER): Payer: 59 | Admitting: Physician Assistant

## 2018-07-15 ENCOUNTER — Encounter (INDEPENDENT_AMBULATORY_CARE_PROVIDER_SITE_OTHER): Payer: Self-pay | Admitting: Physician Assistant

## 2018-07-15 VITALS — BP 138/89 | HR 76 | Temp 97.9°F | Ht 73.0 in | Wt 246.0 lb

## 2018-07-15 DIAGNOSIS — E559 Vitamin D deficiency, unspecified: Secondary | ICD-10-CM | POA: Diagnosis not present

## 2018-07-15 DIAGNOSIS — E119 Type 2 diabetes mellitus without complications: Secondary | ICD-10-CM

## 2018-07-15 DIAGNOSIS — E669 Obesity, unspecified: Secondary | ICD-10-CM | POA: Diagnosis not present

## 2018-07-15 DIAGNOSIS — Z6832 Body mass index (BMI) 32.0-32.9, adult: Secondary | ICD-10-CM

## 2018-07-15 DIAGNOSIS — Z9189 Other specified personal risk factors, not elsewhere classified: Secondary | ICD-10-CM | POA: Diagnosis not present

## 2018-07-15 MED ORDER — VITAMIN D (ERGOCALCIFEROL) 1.25 MG (50000 UNIT) PO CAPS: 50000.0000 [IU] | ORAL_CAPSULE | ORAL | 0 refills | Status: DC

## 2018-07-15 NOTE — Progress Notes (Signed)
Office: [phone]  /  Fax: [phone]   HPI:   Chief Complaint: OBESITY [name] is here to discuss his progress with his obesity treatment plan. He is on the  follow the Category 4 plan and is following his eating plan approximately 65 % of the time. He states he is exercising by walking for 30 minutes 2 times per week. [name] reports that he is still struggling with afternoon snacking. He is choosing simple carbohydrates, chips, and candy. He is ready to get back on track.  His weight is 246 lb (111.6 kg) today and has had a weight gain of 3 pounds over a period of 3 weeks since his last visit. He has lost 3 lbs since starting treatment with [name].  Vitamin D deficiency [name] has a diagnosis of vitamin D deficiency. He is currently taking vit D and denies nausea, vomiting or muscle weakness.  Ref. Range 05/13/2018 08:45  Vitamin D, 25-Hydroxy Latest Ref Range: 30.0 - 100.0 ng/mL 37.8   At risk for osteopenia and osteoporosis [name] is at higher risk of osteopenia and osteoporosis due to vitamin D deficiency.   ALLERGIES: Allergies  Allergen Reactions  . Tomato Other (See Comments)    GI distress    MEDICATIONS: Current Outpatient Medications on File Prior to Visit  Medication Sig Dispense Refill  . buPROPion (WELLBUTRIN XL) 150 MG 24 hr tablet Take 150 mg by mouth daily.   12  . DULoxetine (CYMBALTA) 60 MG capsule Take 60 mg by mouth daily.     [name] esomeprazole (NEXIUM) 40 MG capsule Take 1 capsule (40 mg total) every morning by mouth. 90 capsule 3  . etodolac (LODINE) 500 MG tablet TAKE 1 TABLET (500 MG TOTAL) BY MOUTH 2 (TWO) TIMES DAILY. (Patient taking differently: Take 500 mg by mouth 2 (two) times daily as needed (pain). ) 60 tablet 2  . metFORMIN (GLUCOPHAGE) 1000 MG tablet Take 1 tablet (1,000 mg total) by mouth 2 (two) times daily with a meal. 60 tablet 0  . Multiple Vitamin (MULTIVITAMIN) capsule Take 1 capsule by mouth daily.    . valsartan-hydrochlorothiazide  (DIOVAN-HCT) 160-12.5 MG tablet Take 1 tablet by mouth every morning. 90 tablet 3   No current facility-administered medications on file prior to visit.     PAST MEDICAL HISTORY: Past Medical History:  Diagnosis Date  . Chest pain   . Family history of adverse reaction to anesthesia    MOM-N/V, DAD-HAS PROBLEMS WITH NOVICAINE  . Food allergy   . GERD (gastroesophageal reflux disease)   . History of hiatal hernia   . History of kidney stones    H/O  . Insomnia   . Joint pain   . Plantar fasciitis   . Seasonal allergies   . Tendonitis of foot    left    PAST SURGICAL HISTORY: Past Surgical History:  Procedure Laterality Date  . CARPAL TUNNEL RELEASE Right 07/03/2017   Procedure: CARPAL TUNNEL RELEASE;  Surgeon: Hessie Knows, MD;  Location: [facility];  Service: Orthopedics;  Laterality: Right;  . clavide surgery  2005  . CYSTOSCOPY/URETEROSCOPY/HOLMIUM LASER/STENT PLACEMENT Right 05/22/2018   Procedure: CYSTOSCOPY/URETEROSCOPY/HOLMIUM LASER/STENT PLACEMENT;  Surgeon: Billey Co, MD;  Location: [facility] OR[facility];  Service: Urology;  Laterality: Right;  . KNEE SURGERY Right 2013  . SHOULDER SURGERY Right 2008    SOCIAL HISTORY: Social History   Tobacco Use  . Smoking status: Never Smoker  . Smokeless tobacco: Never Used  Substance Use Topics  . Alcohol use: Yes  Alcohol/week: 0.0 standard drinks    Comment: RARE  . Drug use: No    FAMILY HISTORY: Family History  Problem Relation Age of Onset  . Cancer Mother        colon/rectal  . Hyperlipidemia Mother   . Cancer Brother        hodgekins  . Cancer Maternal Grandfather        lung  . Stroke Maternal Grandfather   . Diabetes Father   . Hypertension Father   . Hyperlipidemia Father   . Heart disease Father   . Alcoholism Father     ROS: Review of Systems  Constitutional: Negative for weight loss.  Gastrointestinal: Negative for nausea and vomiting.  Musculoskeletal:       Negative for muscle weakness     PHYSICAL EXAM: Blood pressure 138/89, pulse 76, temperature 97.9 F (36.6 C), temperature source Oral, height 6\' 1"  (1.854 m), weight 246 lb (111.6 kg), SpO2 98 %. Body mass index is 32.46 kg/m. Physical Exam  Constitutional: He is oriented to person, place, and time. He appears well-developed and well-nourished.  HENT:  Head: Normocephalic.  Neck: Normal range of motion.  Cardiovascular: Normal rate.  Pulmonary/Chest: Effort normal.  Musculoskeletal: Normal range of motion.  Neurological: He is alert and oriented to person, place, and time.  Skin: Skin is warm and dry.  Psychiatric: He has a normal mood and affect. His behavior is normal.  Vitals reviewed.   RECENT LABS AND TESTS: BMET    Component Value Date/Time   NA 142 06/22/2018 0816   NA 138 01/28/2018 1007   NA 138 05/20/2013 0815   K 4.3 06/22/2018 0816   K 3.7 05/20/2013 0815   CL 105 06/22/2018 0816   CL 106 05/20/2013 0815   CO2 28 06/22/2018 0816   CO2 26 05/20/2013 0815   GLUCOSE 112 (H) 06/22/2018 0816   GLUCOSE 101 (H) 05/20/2013 0815   BUN 18 06/22/2018 0816   BUN 18 01/28/2018 1007   BUN 20 (H) 05/20/2013 0815   CREATININE 0.99 06/22/2018 0816   CALCIUM 9.4 06/22/2018 0816   CALCIUM 8.9 05/20/2013 0815   GFRNONAA 92 06/22/2018 0816   GFRAA 107 06/22/2018 0816   Lab Results  Component Value Date   HGBA1C 6.3 (H) 06/22/2018   HGBA1C 6.2 (H) 01/28/2018   HGBA1C 6.7 11/07/2017   HGBA1C 6.3 05/08/2017   HGBA1C 6.5 11/06/2016   Lab Results  Component Value Date   INSULIN 11.6 05/13/2018   INSULIN 10.5 01/28/2018   CBC    Component Value Date/Time   WBC 6.2 06/22/2018 0816   RBC 4.98 06/22/2018 0816   HGB 13.9 06/22/2018 0816   HGB 14.6 01/28/2018 1007   HCT 41.1 06/22/2018 0816   HCT 43.5 01/28/2018 1007   PLT 249 06/22/2018 0816   PLT 250 06/23/2017 1104   MCV 82.5 06/22/2018 0816   MCV 84 01/28/2018 1007   MCV 84 11/05/2011 1606   MCH 27.9 06/22/2018 0816   MCHC 33.8  06/22/2018 0816   RDW 13.2 06/22/2018 0816   RDW 14.3 01/28/2018 1007   RDW 13.0 11/05/2011 1606   LYMPHSABS 1,358 06/22/2018 0816   LYMPHSABS 1.5 01/28/2018 1007   LYMPHSABS 1.8 11/05/2011 1606   MONOABS 0.9 04/12/2018 1820   MONOABS 0.6 11/05/2011 1606   EOSABS 360 06/22/2018 0816   EOSABS 0.2 01/28/2018 1007   EOSABS 0.1 11/05/2011 1606   BASOSABS 50 06/22/2018 0816   BASOSABS 0.0 01/28/2018 1007   BASOSABS  0.0 11/05/2011 1606   Iron/TIBC/Ferritin/ %Sat No results found for: IRON, TIBC, FERRITIN, IRONPCTSAT Lipid Panel     Component Value Date/Time   CHOL 182 06/22/2018 0816   CHOL 175 05/13/2018 0845   TRIG 82 06/22/2018 0816   HDL 65 06/22/2018 0816   HDL 51 05/13/2018 0845   CHOLHDL 2.8 06/22/2018 0816   LDLCALC 100 (H) 06/22/2018 0816   Hepatic Function Panel     Component Value Date/Time   PROT 6.6 06/22/2018 0816   PROT 7.0 01/28/2018 1007   PROT 8.5 (H) 11/05/2011 1606   ALBUMIN 4.6 04/12/2018 1820   ALBUMIN 4.4 01/28/2018 1007   ALBUMIN 4.5 11/05/2011 1606   AST 17 06/22/2018 0816   AST 36 11/05/2011 1606   ALT 18 06/22/2018 0816   ALT 53 11/05/2011 1606   ALKPHOS 63 04/12/2018 1820   ALKPHOS 147 (H) 11/05/2011 1606   BILITOT 0.3 06/22/2018 0816   BILITOT 0.3 01/28/2018 1007   BILITOT 0.5 11/05/2011 1606      Component Value Date/Time   TSH 1.330 01/28/2018 1007   TSH 1.440 01/04/2016 0818     Ref. Range 05/13/2018 08:45  Vitamin D, 25-Hydroxy Latest Ref Range: 30.0 - 100.0 ng/mL 37.8   ASSESSMENT AND PLAN: Vitamin D deficiency - Plan: Vitamin D, Ergocalciferol, (DRISDOL) 50000 units CAPS capsule  Type 2 diabetes mellitus without complication, without long-term current use of insulin (HCC)  At risk for osteoporosis  Class 1 obesity with serious comorbidity and body mass index (BMI) of 32.0 to 32.9 in adult, unspecified obesity type  PLAN: Diabetes II [name] has been given extensive diabetes education by myself today including ideal fasting  and post-prandial blood glucose readings, individual ideal HgA1c goals  and hypoglycemia prevention. We discussed the importance of good blood sugar control to decrease the likelihood of diabetic complications such as nephropathy, neuropathy, limb loss, blindness, coronary artery disease, and death. We discussed the importance of intensive lifestyle modification including diet, exercise and weight loss as the first line treatment for diabetes. Boden agrees to continue his diabetes medications and will follow up at the agreed upon time.  Vitamin D Deficiency [name] was informed that low vitamin D levels contributes to fatigue and are associated with obesity, breast, and colon cancer. He agrees to continue to take prescription Vit D @50 ,000 IU every week #4 with no refills and will follow up for routine testing of vitamin D, at least 2-3 times per year. He was informed of the risk of over-replacement of vitamin D and agrees to not increase his dose unless he discusses this with [name] first.  At risk for osteopenia and osteoporosis [name] was given extended  (15 minutes) osteoporosis prevention counseling today. [name] is at risk for osteopenia and osteoporosis due to his vitamin D deficiency. He was encouraged to take his vitamin D and follow his higher calcium diet and increase strengthening exercise to help strengthen his bones and decrease his risk of osteopenia and osteoporosis.  Obesity [name] is currently in the action stage of change. As such, his goal is to continue with weight loss efforts He has agreed to follow the Category 4 plan [name] has been instructed to work up to a goal of 150 minutes of combined cardio and strengthening exercise per week for weight loss and overall health benefits. We discussed the following Behavioral Modification Strategies today: work on meal planning and easy cooking plans and better snacking choices.    [name] has agreed to follow up with our  clinic in 3 weeks.  He was informed of the importance of frequent follow up visits to maximize his success with intensive lifestyle modifications for his multiple health conditions.   OBESITY BEHAVIORAL INTERVENTION VISIT  Today's visit was # 9   Starting weight: 249 lb Starting date: 01/28/18 Today's weight : 246 lb  Today's date: 07/15/2018 Total lbs lost to date: 3 lb    ASK: We discussed the diagnosis of obesity with [name] today and [name] agreed to give [name] permission to discuss obesity behavioral modification therapy today.  ASSESS: [name] has the diagnosis of obesity and his BMI today is 32.46 [name] is in the action stage of change   ADVISE: [name] was educated on the multiple health risks of obesity as well as the benefit of weight loss to improve his health. He was advised of the need for long term treatment and the importance of lifestyle modifications to improve his current health and to decrease his risk of future health problems.  AGREE: Multiple dietary modification options and treatment options were discussed and  [name] agreed to follow the recommendations documented in the above note.  ARRANGE: [name] was educated on the importance of frequent visits to treat obesity as outlined per CMS and USPSTF guidelines and agreed to schedule his next follow up appointment today.  [name], am acting as transcriptionist for Jon Potash, PA-C I, Jon Potash, PA-C have reviewed above note and agree with its content

## 2018-07-21 ENCOUNTER — Other Ambulatory Visit: Payer: Self-pay | Admitting: Family Medicine

## 2018-07-21 DIAGNOSIS — K219 Gastro-esophageal reflux disease without esophagitis: Secondary | ICD-10-CM

## 2018-08-05 ENCOUNTER — Other Ambulatory Visit: Payer: Self-pay | Admitting: Family Medicine

## 2018-08-05 ENCOUNTER — Encounter (INDEPENDENT_AMBULATORY_CARE_PROVIDER_SITE_OTHER): Payer: Self-pay | Admitting: Physician Assistant

## 2018-08-05 ENCOUNTER — Ambulatory Visit (INDEPENDENT_AMBULATORY_CARE_PROVIDER_SITE_OTHER): Payer: 59 | Admitting: Physician Assistant

## 2018-08-05 VITALS — BP 126/85 | HR 81 | Temp 97.3°F | Ht 73.0 in | Wt 247.0 lb

## 2018-08-05 DIAGNOSIS — E669 Obesity, unspecified: Secondary | ICD-10-CM | POA: Diagnosis not present

## 2018-08-05 DIAGNOSIS — F3289 Other specified depressive episodes: Secondary | ICD-10-CM | POA: Diagnosis not present

## 2018-08-05 DIAGNOSIS — E559 Vitamin D deficiency, unspecified: Secondary | ICD-10-CM | POA: Diagnosis not present

## 2018-08-05 DIAGNOSIS — Z6832 Body mass index (BMI) 32.0-32.9, adult: Secondary | ICD-10-CM | POA: Diagnosis not present

## 2018-08-05 DIAGNOSIS — M1990 Unspecified osteoarthritis, unspecified site: Secondary | ICD-10-CM

## 2018-08-05 DIAGNOSIS — E66811 Obesity, class 1: Secondary | ICD-10-CM

## 2018-08-05 DIAGNOSIS — Z9189 Other specified personal risk factors, not elsewhere classified: Secondary | ICD-10-CM | POA: Diagnosis not present

## 2018-08-05 MED ORDER — VITAMIN D (ERGOCALCIFEROL) 1.25 MG (50000 UNIT) PO CAPS: 50000.0000 [IU] | ORAL_CAPSULE | ORAL | 0 refills | Status: DC

## 2018-08-10 NOTE — Progress Notes (Signed)
Office: [phone]  /  Fax: [phone]   Date: November, 26 2019 Time Seen: 10:00am Duration: 48 minutes Provider: Glennie Isle, PsyD Type of Session: Intake for Individual Therapy   Informed Consent:The provider's role was explained to [name]. The provider reviewed and discussed issues of confidentiality, privacy, and limits therein. In addition to verbal informed consent, written informed consent for psychological services was obtained from [name] prior to the initial intake interview. Written consent included information concerning the practice, financial arrangements, and confidentiality and patients' rights. Since the clinic is not a 24/7 crisis center, mental health emergency resources were shared and a handout was provided. The provider further explained the utilization of MyChart, e-mail, voicemail, and/or other messaging systems can be utilized for non-emergency reasons. [name] verbally acknowledged understanding of the aforementioned, and agreed to use mental health emergency resources discussed if needed. Moreover, [name] agreed information may be shared with other [name] as needed for coordination of care, and written consent was obtained.   Chief Complaint: [name] was referred by Jon Potash, PA-C due to depression with emotional eating behaviors. Per the note for the visit with Jon Potash, PA-C on August 05, 2018, "[name] is struggling with emotional eating and using food for comfort to the extent that it is negatively impacting his health. He often snacks when he is not hungry. [name] sometimes feels he is out of control and then feels guilty that he made poor food choices. He has been working on behavior modification techniques to help reduce his emotional eating and has been somewhat successful. [name] is on Wellbutrin 150mg  and Cymbalta 60mg . He reports "eating all day" recently due to feeling down. He is not as socially  active currently."   During today's appointment, [name] reported, "I just go, go, and go [referring to eating]."  His last episode of emotional eating and binge eating was last week when he consumed a bag of trailmix. [name] could not recall what precipitated the eating episode. He indicated he tends to crave sweets.  [name] was asked to complete a questionnaire assessing various behaviors related to emotional eating. [name] endorsed the following: overeat when you are celebrating, experience food cravings on a regular basis, use food to help you cope with emotional situations, find food is comforting to you, overeat when you are angry or upset, overeat when you are worried about something, overeat frequently when you are bored or lonely, overeat when you are alone, but eat much less when you are with other people, eat to help you stay awake and eat as a reward.  HPI: Per the note for the initial visit with [name] on Jan 28, 2018, [name] has been heavy most of his life and he started gaining weight around 36 or 42 years old. His heaviest weight ever was 280 pounds. During the initial appointment with [name], [name] reported experiencing the following: significant food cravings issues; frequently drinking liquids with calories; frequently making poor food choices; having problems with excessive hunger; frequently eating larger portions than normal; binge eating behaviors; and struggling with emotional eating. During today's appointment, [name] reported a belief that emotional eating started in high school whereas binge eating started in middle school. He denied a history of purging and engagement other compensatory strategies. [name] also denied ever being diagnosed with an eating disorder.  Mental Status Examination: [name] arrived on time for the appointment. He presented as appropriately dressed and groomed. [name] appeared his stated age and demonstrated adequate  orientation to time,  place, person, and purpose of the appointment. He also demonstrated appropriate eye contact. No psychomotor abnormalities or behavioral peculiarities noted. His mood was euthymic with congruent affect. His thought processes were logical, linear, and goal-directed. No hallucinations, delusions, bizarre thinking or behavior reported or observed. Judgment, insight, and impulse control appeared to be grossly intact. There was no evidence of paraphasias (i.e., errors in speech, gross mispronunciations, and word substitutions), repetition deficits, or disturbances in volume or prosody (i.e., rhythm and intonation). There was no evidence of attention or memory impairments. [name] denied current suicidal and homicidal ideation, plan, and intent.   The Mini-Mental State Examination, Second Edition (MMSE-2) was administered. The MMSE-2 briefly screens for cognitive dysfunction and overall mental status and assesses different cognitive domains: orientation, registration, attention and calculation, recall, and language and praxis. [name] received 29 out of 30 points possible on the MMSE-2, which is noted in the normal range. One point was lost on the orientation to time task, as [name] erroneously identified the day of the week.   Family & Psychosocial History: [name] reported he has been married for 59 years and has one adult son, age 29. 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 [name] reported completing two years of college. He indicated his social support consists of his buddy [name]) and wife. He noted he does not identify with a specific religion but described being spiritual. Notably, [name] shared his mother passed away on 2017-07-17.  Medical History:  Past Medical History:  Diagnosis Date  . Chest pain   . Family history of adverse reaction to anesthesia    MOM-N/V, DAD-HAS PROBLEMS WITH NOVICAINE  . Food allergy   . GERD (gastroesophageal reflux disease)   .  History of hiatal hernia   . History of kidney stones    H/O  . Insomnia   . Joint pain   . Plantar fasciitis   . Seasonal allergies   . Tendonitis of foot    left   Past Surgical History:  Procedure Laterality Date  . CARPAL TUNNEL RELEASE Right 07/03/2017   Procedure: CARPAL TUNNEL RELEASE;  Surgeon: Hessie Knows, MD;  Location: [facility];  Service: Orthopedics;  Laterality: Right;  . clavide surgery  2005  . CYSTOSCOPY/URETEROSCOPY/HOLMIUM LASER/STENT PLACEMENT Right 05/22/2018   Procedure: CYSTOSCOPY/URETEROSCOPY/HOLMIUM LASER/STENT PLACEMENT;  Surgeon: Billey Co, MD;  Location: [facility];  Service: Urology;  Laterality: Right;  . KNEE SURGERY Right 2013  . SHOULDER SURGERY Right 2008   Current Outpatient Medications on File Prior to Visit  Medication Sig Dispense Refill  . buPROPion (WELLBUTRIN XL) 150 MG 24 hr tablet Take 150 mg by mouth daily.   12  . DULoxetine (CYMBALTA) 60 MG capsule Take 60 mg by mouth daily.     [name] esomeprazole (NEXIUM) 40 MG capsule TAKE 1 CAPSULE BY MOUTH ONCE DAILY 90 capsule 3  . etodolac (LODINE) 500 MG tablet Take 1 tablet (500 mg total) by mouth 2 (two) times daily as needed (moderate pain). 60 tablet 0  . metFORMIN (GLUCOPHAGE) 1000 MG tablet Take 1 tablet (1,000 mg total) by mouth 2 (two) times daily with a meal. 60 tablet 0  . Multiple Vitamin (MULTIVITAMIN) capsule Take 1 capsule by mouth daily.    . valsartan-hydrochlorothiazide (DIOVAN-HCT) 160-12.5 MG tablet Take 1 tablet by mouth every morning. 90 tablet 3  . Vitamin D, Ergocalciferol, (DRISDOL) 1.25 MG (50000 UT) CAPS capsule Take 1 capsule (50,000 Units total) by  mouth every 7 (seven) days. 4 capsule 0   No current facility-administered medications on file prior to visit.   Olando denied a history of head injuries and loss of consciousness.   Mental Health History: Doyne first received therapeutic services approximately 15 years ago. He explained it was for anger management, and  denied it being court mandated. He attended individual therapy approximately one year.  Approximately four years ago, he reinitiated therapeutic services, and attended therapy "on and off" up until six months ago. Theador shared he also initiated psychiatric services approximately two years ago, and continues to meet with Dr. Toy Care who currently prescribes Wellbutrin and Cymbalta. He denied a history of hospitalizations for psychiatric concerns.  Oneil also denied a family history of mental health concerns. Asante denied a trauma history, including psychological, physical  and sexual abuse, as well as neglect. However, he reported experiencing "abandment issues" that started in childhood.  Jermani shared his mood has been "bad in the past month or so." He reported he believes it is seasonal, and explained it has happened in the past. Currently, he uses a sunlight machine. He further reported experiencing the following: anhedonia, depressed mood, hopelessness, fatigue, overeating, decreased self-esteem, attention and concentration issues and becoming easily annoyed. He explained the hopelessness is related to work. Arlan reported experiencing worry thoughts regarding the following: wife, dad, and son. Regarding substance use, Flemon noted he "rarely, rarely drinks." When he consumes alcohol, it is in the form of one standard beer. He denied use of any recreational or illicit substances.   Shandon denied experiencing the following: sleep difficulties, memory concerns, feeling fidgety/restless, irritability, obsessions and compulsions, hallucinations and delusions, mania, angry outbursts and moving/speaking slowly. He also denied experiencing history of and current suicidal ideation, plan, and intent; history of and current homicidal ideation, plan, and intent; and history of and current engagement in self-harm. Travion also denied legal involvement.   Structured Assessment Results: The Patient Health  Questionnaire-9 (PHQ-9) is a self-report measure that assesses symptoms and severity of depression over the course of the last two weeks. Righteous obtained a score of 15 suggesting moderately severe depression. Huy finds the endorsed symptoms to be very difficult. Depression screen PHQ 2/9 08/11/2018  Decreased Interest 3  Down, Depressed, Hopeless 2  PHQ - 2 Score 5  Altered sleeping 0  Tired, decreased energy 2  Change in appetite 3  Feeling bad or failure about yourself  2  Trouble concentrating 3  Moving slowly or fidgety/restless 0  Suicidal thoughts 0  PHQ-9 Score 15  Difficult doing work/chores -   The Generalized Anxiety Disorder-7 (GAD-7) is a brief self-report measure that assesses symptoms of anxiety over the course of the last two weeks. [place] obtained a score of 7 suggesting mild anxiety. GAD 7 : Generalized Anxiety Score 08/11/2018  Nervous, Anxious, on Edge 0  Control/stop worrying 2  Worry too much - different things 2  Trouble relaxing 1  Restless 0  Easily annoyed or irritable 2  Afraid - awful might happen 0  Total GAD 7 Score 7  Anxiety Difficulty Very difficult   Interventions: A chart review was conducted prior to the clinical intake interview. The MMSE-2, PHQ-9, and GAD-7 were administered and a clinical intake interview was completed. In addition, Denario was asked to complete a Mood and Food questionnaire to assess various behaviors related to emotional eating. Throughout session, empathic reflections and validation was provided. Continuing treatment with this provider was discussed and a treatment goal was established.  Psychoeducation regarding emotional versus physical hunger was provided. Ricki was given a handout to utilize between now and the next appointment to increase awareness of hunger patterns and subsequent eating.   Provisional DSM-5 Diagnosis: 311 (F32.8) Other Specified Depressive Disorder, Emotional Eating Behaviors  Plan: [place] expressed  understanding and agreement with the initial treatment plan of care. He appears able and willing to participate as evidenced by collaboration on a treatment goal, engagement in reciprocal conversation, and asking questions as needed for clarification. The next appointment will be scheduled in two weeks. The following treatment goal was established: decrease emotional eating. For the aforementioned goal, Massey can benefit from biweekly sessions that are brief in duration for approximately four to six sessions.

## 2018-08-11 ENCOUNTER — Ambulatory Visit (INDEPENDENT_AMBULATORY_CARE_PROVIDER_SITE_OTHER): Payer: 59 | Admitting: Psychology

## 2018-08-11 DIAGNOSIS — F3289 Other specified depressive episodes: Secondary | ICD-10-CM | POA: Diagnosis not present

## 2018-08-11 NOTE — Progress Notes (Signed)
Office: [phone]  /  Fax: [phone]   HPI:   Chief Complaint: OBESITY [name] is here to discuss his progress with his obesity treatment plan. He is on the Category 4 plan and is following his eating plan approximately 50 to 60 % of the time. He states he is walking 1 mile 2 to 3 times per week. [name] reports "eating all day" recently due to a decrease in mood. It's the 2 year anniversary of his mother's death and he has not felt like exercising or being social like normal. He is eating more trail mix than normal.  His weight is 247 lb (112 kg) today and has had a weight gain  of 1 pound over a period of 3 weeks since his last visit. He has lost 2 lbs since starting treatment with [name].  Vitamin D deficiency [name] has a diagnosis of vitamin D deficiency. He is currently taking vit D and denies nausea, vomiting, or muscle weakness.  At risk for osteopenia and osteoporosis [name] is at higher risk of osteopenia and osteoporosis due to vitamin D deficiency.   Depression with emotional eating behaviors [name] is struggling with emotional eating and using food for comfort to the extent that it is negatively impacting his health. He often snacks when he is not hungry. [name] sometimes feels he is out of control and then feels guilty that he made poor food choices. He has been working on behavior modification techniques to help reduce his emotional eating and has been somewhat successful. [name] is on Wellbutrin 150mg  and Cymbalta 60mg . He reports "eating all day" recently due to feeling down. He is not as socially active currently.  ALLERGIES: Allergies  Allergen Reactions  . Tomato Other (See Comments)    GI distress    MEDICATIONS: Current Outpatient Medications on File Prior to Visit  Medication Sig Dispense Refill  . buPROPion (WELLBUTRIN XL) 150 MG 24 hr tablet Take 150 mg by mouth daily.   12  . DULoxetine (CYMBALTA) 60 MG capsule Take 60 mg by mouth daily.     [name] esomeprazole  (NEXIUM) 40 MG capsule TAKE 1 CAPSULE BY MOUTH ONCE DAILY 90 capsule 3  . metFORMIN (GLUCOPHAGE) 1000 MG tablet Take 1 tablet (1,000 mg total) by mouth 2 (two) times daily with a meal. 60 tablet 0  . Multiple Vitamin (MULTIVITAMIN) capsule Take 1 capsule by mouth daily.    . valsartan-hydrochlorothiazide (DIOVAN-HCT) 160-12.5 MG tablet Take 1 tablet by mouth every morning. 90 tablet 3   No current facility-administered medications on file prior to visit.     PAST MEDICAL HISTORY: Past Medical History:  Diagnosis Date  . Chest pain   . Family history of adverse reaction to anesthesia    MOM-N/V, DAD-HAS PROBLEMS WITH NOVICAINE  . Food allergy   . GERD (gastroesophageal reflux disease)   . History of hiatal hernia   . History of kidney stones    H/O  . Insomnia   . Joint pain   . Plantar fasciitis   . Seasonal allergies   . Tendonitis of foot    left    PAST SURGICAL HISTORY: Past Surgical History:  Procedure Laterality Date  . CARPAL TUNNEL RELEASE Right 07/03/2017   Procedure: CARPAL TUNNEL RELEASE;  Surgeon: Hessie Knows, MD;  Location: [facility];  Service: Orthopedics;  Laterality: Right;  . clavide surgery  2005  . CYSTOSCOPY/URETEROSCOPY/HOLMIUM LASER/STENT PLACEMENT Right 05/22/2018   Procedure: CYSTOSCOPY/URETEROSCOPY/HOLMIUM LASER/STENT PLACEMENT;  Surgeon: [name] Co, MD;  Location:  [facility];  Service: Urology;  Laterality: Right;  . KNEE SURGERY Right 2013  . SHOULDER SURGERY Right 2008    SOCIAL HISTORY: Social History   Tobacco Use  . Smoking status: Never Smoker  . Smokeless tobacco: Never Used  Substance Use Topics  . Alcohol use: Yes    Alcohol/week: 0.0 standard drinks    Comment: RARE  . Drug use: No    FAMILY HISTORY: Family History  Problem Relation Age of Onset  . Cancer Mother        colon/rectal  . Hyperlipidemia Mother   . Cancer Brother        hodgekins  . Cancer Maternal Grandfather        lung  . Stroke Maternal Grandfather     . Diabetes Father   . Hypertension Father   . Hyperlipidemia Father   . Heart disease Father   . Alcoholism Father     ROS: Review of Systems  Constitutional: Negative for weight loss.  Gastrointestinal: Negative for nausea and vomiting.  Musculoskeletal:       Negative for muscle weakness.  Psychiatric/Behavioral: Positive for depression.    PHYSICAL EXAM: Blood pressure 126/85, pulse 81, temperature (!) 97.3 F (36.3 Poole), temperature source Oral, height 6\' 1"  (1.854 m), weight 247 lb (112 kg), SpO2 98 %. Body mass index is 32.59 kg/m. Physical Exam  Constitutional: He is oriented to person, place, and time. He appears well-developed and well-nourished.  Cardiovascular: Normal rate.  Pulmonary/Chest: Effort normal.  Musculoskeletal: Normal range of motion.  Neurological: He is oriented to person, place, and time.  Skin: Skin is warm and dry.  Psychiatric: He has a normal mood and affect. His behavior is normal.  Vitals reviewed.   RECENT LABS AND TESTS: BMET    Component Value Date/Time   NA 142 06/22/2018 0816   NA 138 01/28/2018 1007   NA 138 05/20/2013 0815   K 4.3 06/22/2018 0816   K 3.7 05/20/2013 0815   CL 105 06/22/2018 0816   CL 106 05/20/2013 0815   CO2 28 06/22/2018 0816   CO2 26 05/20/2013 0815   GLUCOSE 112 (H) 06/22/2018 0816   GLUCOSE 101 (H) 05/20/2013 0815   BUN 18 06/22/2018 0816   BUN 18 01/28/2018 1007   BUN 20 (H) 05/20/2013 0815   CREATININE 0.99 06/22/2018 0816   CALCIUM 9.4 06/22/2018 0816   CALCIUM 8.9 05/20/2013 0815   GFRNONAA 92 06/22/2018 0816   GFRAA 107 06/22/2018 0816   Lab Results  Component Value Date   HGBA1C 6.3 (H) 06/22/2018   HGBA1C 6.2 (H) 01/28/2018   HGBA1C 6.7 11/07/2017   HGBA1C 6.3 05/08/2017   HGBA1C 6.5 11/06/2016   Lab Results  Component Value Date   INSULIN 11.6 05/13/2018   INSULIN 10.5 01/28/2018   CBC    Component Value Date/Time   WBC 6.2 06/22/2018 0816   RBC 4.98 06/22/2018 0816   HGB  13.9 06/22/2018 0816   HGB 14.6 01/28/2018 1007   HCT 41.1 06/22/2018 0816   HCT 43.5 01/28/2018 1007   PLT 249 06/22/2018 0816   PLT 250 06/23/2017 1104   MCV 82.5 06/22/2018 0816   MCV 84 01/28/2018 1007   MCV 84 11/05/2011 1606   MCH 27.9 06/22/2018 0816   MCHC 33.8 06/22/2018 0816   RDW 13.2 06/22/2018 0816   RDW 14.3 01/28/2018 1007   RDW 13.0 11/05/2011 1606   LYMPHSABS 1,358 06/22/2018 0816   LYMPHSABS 1.5 01/28/2018 1007  LYMPHSABS 1.8 11/05/2011 1606   MONOABS 0.9 04/12/2018 1820   MONOABS 0.6 11/05/2011 1606   EOSABS 360 06/22/2018 0816   EOSABS 0.2 01/28/2018 1007   EOSABS 0.1 11/05/2011 1606   BASOSABS 50 06/22/2018 0816   BASOSABS 0.0 01/28/2018 1007   BASOSABS 0.0 11/05/2011 1606   Iron/TIBC/Ferritin/ %Sat No results found for: IRON, TIBC, FERRITIN, IRONPCTSAT Lipid Panel     Component Value Date/Time   CHOL 182 06/22/2018 0816   CHOL 175 05/13/2018 0845   TRIG 82 06/22/2018 0816   HDL 65 06/22/2018 0816   HDL 51 05/13/2018 0845   CHOLHDL 2.8 06/22/2018 0816   LDLCALC 100 (H) 06/22/2018 0816   Hepatic Function Panel     Component Value Date/Time   PROT 6.6 06/22/2018 0816   PROT 7.0 01/28/2018 1007   PROT 8.5 (H) 11/05/2011 1606   ALBUMIN 4.6 04/12/2018 1820   ALBUMIN 4.4 01/28/2018 1007   ALBUMIN 4.5 11/05/2011 1606   AST 17 06/22/2018 0816   AST 36 11/05/2011 1606   ALT 18 06/22/2018 0816   ALT 53 11/05/2011 1606   ALKPHOS 63 04/12/2018 1820   ALKPHOS 147 (H) 11/05/2011 1606   BILITOT 0.3 06/22/2018 0816   BILITOT 0.3 01/28/2018 1007   BILITOT 0.5 11/05/2011 1606      Component Value Date/Time   TSH 1.330 01/28/2018 1007   TSH 1.440 01/04/2016 0818   Results for [name], [name] "NED" (MRN [id number]) as of 08/11/2018 07:53  Ref. Range 05/13/2018 08:45  Vitamin D, 25-Hydroxy Latest Ref Range: 30.0 - 100.0 ng/mL 37.8   ASSESSMENT AND PLAN: Vitamin D deficiency - Plan: Vitamin D, Ergocalciferol, (DRISDOL) 1.25 MG (50000 UT) CAPS  capsule  Other depression - with emotional eating   At risk for osteoporosis  Class 1 obesity with serious comorbidity and body mass index (BMI) of 32.0 to 32.9 in adult, unspecified obesity type  PLAN:  Vitamin D Deficiency [name] was informed that low vitamin D levels contributes to fatigue and are associated with obesity, breast, and colon cancer. He agrees to continue to take prescription Vit D @50 ,000 IU every week #4 with no refills and will follow up for routine testing of vitamin D, at least 2-3 times per year. He was informed of the risk of over-replacement of vitamin D and agrees to not increase his dose unless he discusses this with [name] first. [name] agrees to follow up in 3 weeks.  At risk for osteopenia and osteoporosis [name] was given extended (15 minutes) osteoporosis prevention counseling today. [name] is at risk for osteopenia and osteoporosis due to his vitamin D deficiency. He was encouraged to take his vitamin D and follow his higher calcium diet and increase strengthening exercise to help strengthen his bones and decrease his risk of osteopenia and osteoporosis.  Depression with Emotional Eating Behaviors We discussed behavior modification techniques today to help [name] deal with his emotional eating and depression. Patient was referred to Dr. Mallie Mussel, our bariatric psychologist for evaluation due to elevated PHQ-9 score and significant struggles with emotional eating. Malyk agreed to follow up as directed  Obesity [name] is currently in the action stage of change. As such, his goal is to continue with weight loss efforts. He has agreed to follow the Category 4 plan. [name] has been instructed to work up to a goal of 150 minutes of combined cardio and strengthening exercise per week for weight loss and overall health benefits. We discussed the following Behavioral Modification Strategies today: work on  meal planning and easy cooking plans and holiday eating strategies.    [name] has agreed to follow up with our clinic in 3 weeks. He was informed of the importance of frequent follow up visits to maximize his success with intensive lifestyle modifications for his multiple health conditions.   OBESITY BEHAVIORAL INTERVENTION VISIT  Today's visit was # 10   Starting weight: 249 Starting date: 01/28/18 Today's weight : Weight: 247 lb (112 kg)  Today's date: 08/05/2018 Total lbs lost to date: 2  ASK: We discussed the diagnosis of obesity with [place] today and Dennison agreed to give [name] permission to discuss obesity behavioral modification therapy today.  ASSESS: Madsen has the diagnosis of obesity and his BMI today is 32.59. Chanler is in the action stage of change.   ADVISE: Bryton was educated on the multiple health risks of obesity as well as the benefit of weight loss to improve his health. He was advised of the need for long term treatment and the importance of lifestyle modifications to improve his current health and to decrease his risk of future health problems.  AGREE: Multiple dietary modification options and treatment options were discussed and Toney agreed to follow the recommendations documented in the above note.  ARRANGE: Xaviar was educated on the importance of frequent visits to treat obesity as outlined per CMS and USPSTF guidelines and agreed to schedule his next follow up appointment today.  Lenward Chancellor, am acting as transcriptionist for Abby Potash, PA-Poole I, Abby Potash, PA-Poole have reviewed above note and agree with its content

## 2018-08-26 ENCOUNTER — Ambulatory Visit (INDEPENDENT_AMBULATORY_CARE_PROVIDER_SITE_OTHER): Payer: 59 | Admitting: Physician Assistant

## 2018-09-01 ENCOUNTER — Ambulatory Visit (INDEPENDENT_AMBULATORY_CARE_PROVIDER_SITE_OTHER): Payer: 59 | Admitting: Psychology

## 2018-09-01 DIAGNOSIS — F3289 Other specified depressive episodes: Secondary | ICD-10-CM

## 2018-09-01 NOTE — Progress Notes (Signed)
Office: [phone]  /  Fax: [phone]   Date: September 01, 2018 Time Seen: 8:00am Duration: 32 minutes Provider: Glennie Isle, Psy.D. Type of Session: Individual Therapy   HPI: Ireoluwa was referred by Abby Potash, PA-C due to depression with emotional eating behaviors. Per the note for the visit with Abby Potash, PA-C on August 05, 2018, "Nedleyis struggling with emotional eating and using food for comfort to the extent that it is negatively impacting hishealth. Heoften snacks when heis not hungry. Nedleysometimes feels heis out of control and then feels guilty that hemade poor food choices. Hehas been working on behavior modification techniques to help reduce hisemotional eating and has been somewhat successful.Joshuah is on Wellbutrin 150mg  and Cymbalta 60mg . He reports "eating all day" recently due to feeling down. He is not as socially active currently." In addition, per the note for the initial visit with Dr. Dennard Nip on Jan 28, 2018, Donavan has been heavy most of his life and he started gaining weight around 4 or 42 years old. Hisheaviest weight ever was 280pounds. During the initial appointment with Dr. Leafy Ro, Azucena [name] reported experiencing the following: significant food cravings issues; frequently drinking liquids with calories; frequently making poor food choices; having problems with excessive hunger; frequently eating larger portions than normal; binge eating behaviors; and struggling with emotional eating.  During the initial appointment with this provider, Lacorey reported, "I just go, go, and go [referring to eating]."  His last episode of emotional eating and binge eating was last week when he consumed a bag of trailmix. Dalan could not recall what precipitated the eating episode. He indicated he tends to crave sweets. Moreover, Lovelace reported a belief that emotional eating started in high school whereas binge eating started in middle school. He  denied a history of purging and engagement other compensatory strategies. Breaker also denied ever being diagnosed with an eating disorder. Furthermore, Larwence was asked to complete a questionnaire assessing various behaviors related to emotional eating. [place] endorsed the following: overeat when you are celebrating, experience food cravings on a regular basis, use food to help you cope with emotional situations, find food is comforting to you, overeat when you are angry or upset, overeat when you are worried about something, overeat frequently when you are bored or lonely, overeat when you are alone, but eat much less when you are with other people, eat to help you stay awake and eat as a reward.  Session Content: Session focused on the following treatment goal: decrease emotional eating. The session was initiated with the administration of the PHQ-9 and GAD-7, as well as a brief check-in. Fadi reported an increase in time spent outside, and he noted, "It helps me out." Additionally, Rahkim reported over snacking typically during mid-morning and mid-afternoon. This was explored further. Psychoeducation regarding the connection between thoughts, feelings, and behaviors was provided. Pete was given a handout outlining the aforementioned. Moreover, psychoeducation regarding triggers for emotional eating was provided. Jonn was provided a handout, and encouraged to utilize the handout between now and the next appointment to increase awareness of triggers and frequency. Marsh agreed. Per Art's self-report of improved moved secondary to spending time outdoors, psychoeducation regarding pleasurable activities, including its impact on emotional eating was provided. Roddy was provided with a handout with various options of pleasurable activities, and was encouraged to engage in different activities between now and the next appointment with this provider. Emric agreed. Tyrae was receptive to today's session as  evidenced by openness to sharing, responsiveness to feedback,  and willingness to identify triggers and engage in pleasurable activities.  Mental Status Examination: Jaydis arrived on time for the appointment. He presented as appropriately dressed and groomed. Anik appeared his stated age and demonstrated adequate orientation to time, place, person, and purpose of the appointment. He also demonstrated appropriate eye contact. No psychomotor abnormalities or behavioral peculiarities noted. His mood was euthymic with congruent affect. His thought processes were logical, linear, and goal-directed. No hallucinations, delusions, bizarre thinking or behavior reported or observed. Judgment, insight, and impulse control appeared to be grossly intact. There was no evidence of paraphasias (i.e., errors in speech, gross mispronunciations, and word substitutions), repetition deficits, or disturbances in volume or prosody (i.e., rhythm and intonation). There was no evidence of attention or memory impairments. Halim denied current suicidal and homicidal ideation, intent or plan.  Structured Assessment Results: The Patient Health Questionnaire-9 (PHQ-9) is a self-report measure that assesses symptoms and severity of depression over the course of the last two weeks. Sabir obtained a score of 13 suggesting moderate depression. Reade finds the endorsed symptoms to be somewhat difficult. Depression screen PHQ 2/9 09/01/2018  Decreased Interest 2  Down, Depressed, Hopeless 2  PHQ - 2 Score 4  Altered sleeping 0  Tired, decreased energy 3  Change in appetite 3  Feeling bad or failure about yourself  1  Trouble concentrating 2  Moving slowly or fidgety/restless 0  Suicidal thoughts 0  PHQ-9 Score 13  Difficult doing work/chores -   The Generalized Anxiety Disorder-7 (GAD-7) is a brief self-report measure that assesses symptoms of anxiety over the course of the last two weeks. [place] obtained a score of 6 suggesting  mild anxiety. GAD 7 : Generalized Anxiety Score 09/01/2018  Nervous, Anxious, on Edge 0  Control/stop worrying 1  Worry too much - different things 1  Trouble relaxing 3  Restless 0  Easily annoyed or irritable 1  Afraid - awful might happen 0  Total GAD 7 Score 6  Anxiety Difficulty Not difficult at all   Interventions: Bulmaro was administered the PHQ-9 and GAD-7 for symptom monitoring. Content from the last session was reviewed. Throughout today's session, empathic reflections and validation were provided. Psychoeducation regarding the connection between thoughts, feelings, and behaviors; triggers for emotional eating; and pleasurable activities was provided.  DSM-5 Diagnosis: 311 (F32.8) Other Specified Depressive Disorder, Emotional Eating Behaviors  Treatment Goal & Progress: Tung was seen for an initial appointment with this provider on August 11, 2018 during which the following treatment goal was established: decrease emotional eating. Geoff has demonstrated progress in his goal as evidenced by increased awareness of hunger patterns and willingness to identify his triggers for emotional eating. He also demonstrated willingness to engage in pleasurable activities to assist with coping.  Plan: Demaryius continues to appear able and willing to participate as evidenced by engagement in reciprocal conversation, and asking questions for clarification as appropriate. The next appointment will be scheduled in two to three due to the upcoming holidays and this provider being out of the office. The next session will focus on reviewing learned skills, and the introduction of mindfulness.

## 2018-09-03 ENCOUNTER — Ambulatory Visit (INDEPENDENT_AMBULATORY_CARE_PROVIDER_SITE_OTHER): Payer: 59 | Admitting: Physician Assistant

## 2018-09-03 ENCOUNTER — Encounter (INDEPENDENT_AMBULATORY_CARE_PROVIDER_SITE_OTHER): Payer: Self-pay | Admitting: Physician Assistant

## 2018-09-03 VITALS — BP 142/92 | HR 94 | Temp 98.1°F | Ht 73.0 in | Wt 253.0 lb

## 2018-09-03 DIAGNOSIS — Z9189 Other specified personal risk factors, not elsewhere classified: Secondary | ICD-10-CM | POA: Diagnosis not present

## 2018-09-03 DIAGNOSIS — E559 Vitamin D deficiency, unspecified: Secondary | ICD-10-CM

## 2018-09-03 DIAGNOSIS — E669 Obesity, unspecified: Secondary | ICD-10-CM

## 2018-09-03 DIAGNOSIS — Z6833 Body mass index (BMI) 33.0-33.9, adult: Secondary | ICD-10-CM | POA: Diagnosis not present

## 2018-09-03 DIAGNOSIS — E119 Type 2 diabetes mellitus without complications: Secondary | ICD-10-CM | POA: Diagnosis not present

## 2018-09-03 MED ORDER — METFORMIN HCL 1000 MG PO TABS: 1000.0000 mg | ORAL_TABLET | Freq: Two times a day (BID) | ORAL | 0 refills | Status: DC

## 2018-09-03 MED ORDER — VITAMIN D (ERGOCALCIFEROL) 1.25 MG (50000 UNIT) PO CAPS: 50000.0000 [IU] | ORAL_CAPSULE | ORAL | 0 refills | Status: DC

## 2018-09-07 NOTE — Progress Notes (Signed)
Office: [phone]  /  Fax: [phone]   HPI:   Chief Complaint: OBESITY [name] is here to discuss his progress with his obesity treatment plan. He is on the Category 4 plan and is following his eating plan approximately 15 to 20 % of the time. He states he is exercising 15 to 20 minutes 3 times per week. [name] reports that he has been eating out more often and he is not getting all of his protein in. He has decreased his snacking throughout the day. His weight is 253 lb (114.8 kg) today and has had a weight gain of 6 pounds over a period of 4 weeks since his last visit. He has gained 4 lbs since starting treatment with [name].  Vitamin D deficiency [name] has a diagnosis of vitamin D deficiency. He is currently taking vit D and denies nausea, vomiting or muscle weakness.  At risk for osteopenia and osteoporosis [name] is at higher risk of osteopenia and osteoporosis due to vitamin D deficiency.   Diabetes II [name] has a diagnosis of diabetes type II. [name] states fasting BGs range between 99 and 125 and he denies any hypoglycemic episodes or polyphagia. Last A1c was at 6.3 He has been working on intensive lifestyle modifications including diet, exercise, and weight loss to help control his blood glucose levels.  ASSESSMENT AND PLAN:  Vitamin D deficiency - Plan: Vitamin D, Ergocalciferol, (DRISDOL) 1.25 MG (50000 UT) CAPS capsule  Type 2 diabetes mellitus without complication, without long-term current use of insulin (HCC) - Plan: metFORMIN (GLUCOPHAGE) 1000 MG tablet  At risk for osteoporosis  Class 1 obesity with serious comorbidity and body mass index (BMI) of 33.0 to 33.9 in adult, unspecified obesity type  PLAN:  Vitamin D Deficiency [name] was informed that low vitamin D levels contributes to fatigue and are associated with obesity, breast, and colon cancer. He agrees to continue to take prescription Vit D @50 ,000 IU every week #8 with no refills and will follow up for  routine testing of vitamin D, at least 2-3 times per year. He was informed of the risk of over-replacement of vitamin D and agrees to not increase his dose unless he discusses this with [name] first. [name] agreed to follow up with our clinic in 3 weeks.  At risk for osteopenia and osteoporosis [name] was given extended  (15 minutes) osteoporosis prevention counseling today. [name] is at risk for osteopenia and osteoporosis due to his vitamin D deficiency. He was encouraged to take his vitamin D and follow his higher calcium diet and increase strengthening exercise to help strengthen his bones and decrease his risk of osteopenia and osteoporosis.  Diabetes II [name] has been given extensive diabetes education by myself today including ideal fasting and post-prandial blood glucose readings, individual ideal Hgb A1c goals and hypoglycemia prevention. We discussed the importance of good blood sugar control to decrease the likelihood of diabetic complications such as nephropathy, neuropathy, limb loss, blindness, coronary artery disease, and death. We discussed the importance of intensive lifestyle modification including diet, exercise and weight loss as the first line treatment for diabetes. [name] agrees to follow up with our clinic in 3 weeks and follow up at the agreed upon time.  Obesity [name] is currently in the action stage of change. As such, his goal is to continue with weight loss efforts He has agreed to keep a food journal with 550 to 770 calories and 45+ grams of protein and follow the Category 4 plan [name] has been  instructed to work up to a goal of 150 minutes of combined cardio and strengthening exercise per week for weight loss and overall health benefits. We discussed the following Behavioral Modification Strategies today: increasing lean protein intake and work on meal planning and easy cooking plans  [name] has agreed to follow up with our clinic in 3 weeks. He was informed of the  importance of frequent follow up visits to maximize his success with intensive lifestyle modifications for his multiple health conditions.  ALLERGIES: Allergies  Allergen Reactions  . Tomato Other (See Comments)    GI distress    MEDICATIONS: Current Outpatient Medications on File Prior to Visit  Medication Sig Dispense Refill  . buPROPion (WELLBUTRIN XL) 150 MG 24 hr tablet Take 150 mg by mouth daily.   12  . DULoxetine (CYMBALTA) 60 MG capsule Take 60 mg by mouth daily.     [name] esomeprazole (NEXIUM) 40 MG capsule TAKE 1 CAPSULE BY MOUTH ONCE DAILY 90 capsule 3  . etodolac (LODINE) 500 MG tablet Take 1 tablet (500 mg total) by mouth 2 (two) times daily as needed (moderate pain). 60 tablet 0  . Multiple Vitamin (MULTIVITAMIN) capsule Take 1 capsule by mouth daily.    . valsartan-hydrochlorothiazide (DIOVAN-HCT) 160-12.5 MG tablet Take 1 tablet by mouth every morning. 90 tablet 3   No current facility-administered medications on file prior to visit.     PAST MEDICAL HISTORY: Past Medical History:  Diagnosis Date  . Chest pain   . Family history of adverse reaction to anesthesia    MOM-N/V, DAD-HAS PROBLEMS WITH NOVICAINE  . Food allergy   . GERD (gastroesophageal reflux disease)   . History of hiatal hernia   . History of kidney stones    H/O  . Insomnia   . Joint pain   . Plantar fasciitis   . Seasonal allergies   . Tendonitis of foot    left    PAST SURGICAL HISTORY: Past Surgical History:  Procedure Laterality Date  . CARPAL TUNNEL RELEASE Right 07/03/2017   Procedure: CARPAL TUNNEL RELEASE;  Surgeon: Hessie Knows, MD;  Location: [facility] OR[facility];  Service: Orthopedics;  Laterality: Right;  . clavide surgery  2005  . CYSTOSCOPY/URETEROSCOPY/HOLMIUM LASER/STENT PLACEMENT Right 05/22/2018   Procedure: CYSTOSCOPY/URETEROSCOPY/HOLMIUM LASER/STENT PLACEMENT;  Surgeon: Billey Co, MD;  Location: [facility];  Service: Urology;  Laterality: Right;  . KNEE SURGERY Right 2013  .  SHOULDER SURGERY Right 2008    SOCIAL HISTORY: Social History   Tobacco Use  . Smoking status: Never Smoker  . Smokeless tobacco: Never Used  Substance Use Topics  . Alcohol use: Yes    Alcohol/week: 0.0 standard drinks    Comment: RARE  . Drug use: No    FAMILY HISTORY: Family History  Problem Relation Age of Onset  . Cancer Mother        colon/rectal  . Hyperlipidemia Mother   . Cancer Brother        hodgekins  . Cancer Maternal Grandfather        lung  . Stroke Maternal Grandfather   . Diabetes Father   . Hypertension Father   . Hyperlipidemia Father   . Heart disease Father   . Alcoholism Father     ROS: Review of Systems  Constitutional: Negative for weight loss.  Gastrointestinal: Negative for nausea and vomiting.  Musculoskeletal:       Negative for muscle weakness  Endo/Heme/Allergies:       Negative for polyphagia Negative for  hypoglycemia    PHYSICAL EXAM: Blood pressure (!) 142/92, pulse 94, temperature 98.1 F (36.7 C), temperature source Oral, height 6\' 1"  (1.854 m), weight 253 lb (114.8 kg), SpO2 96 %. Body mass index is 33.38 kg/m. Physical Exam Vitals signs reviewed.  Constitutional:      Appearance: Normal appearance. He is well-developed. He is obese.  Cardiovascular:     Rate and Rhythm: Normal rate.  Pulmonary:     Effort: Pulmonary effort is normal.  Musculoskeletal: Normal range of motion.  Skin:    General: Skin is warm and dry.  Neurological:     Mental Status: He is alert and oriented to person, place, and time.  Psychiatric:        Mood and Affect: Mood normal.        Behavior: Behavior normal.     RECENT LABS AND TESTS: BMET    Component Value Date/Time   NA 142 06/22/2018 0816   NA 138 01/28/2018 1007   NA 138 05/20/2013 0815   K 4.3 06/22/2018 0816   K 3.7 05/20/2013 0815   CL 105 06/22/2018 0816   CL 106 05/20/2013 0815   CO2 28 06/22/2018 0816   CO2 26 05/20/2013 0815   GLUCOSE 112 (H) 06/22/2018 0816    GLUCOSE 101 (H) 05/20/2013 0815   BUN 18 06/22/2018 0816   BUN 18 01/28/2018 1007   BUN 20 (H) 05/20/2013 0815   CREATININE 0.99 06/22/2018 0816   CALCIUM 9.4 06/22/2018 0816   CALCIUM 8.9 05/20/2013 0815   GFRNONAA 92 06/22/2018 0816   GFRAA 107 06/22/2018 0816   Lab Results  Component Value Date   HGBA1C 6.3 (H) 06/22/2018   HGBA1C 6.2 (H) 01/28/2018   HGBA1C 6.7 11/07/2017   HGBA1C 6.3 05/08/2017   HGBA1C 6.5 11/06/2016   Lab Results  Component Value Date   INSULIN 11.6 05/13/2018   INSULIN 10.5 01/28/2018   CBC    Component Value Date/Time   WBC 6.2 06/22/2018 0816   RBC 4.98 06/22/2018 0816   HGB 13.9 06/22/2018 0816   HGB 14.6 01/28/2018 1007   HCT 41.1 06/22/2018 0816   HCT 43.5 01/28/2018 1007   PLT 249 06/22/2018 0816   PLT 250 06/23/2017 1104   MCV 82.5 06/22/2018 0816   MCV 84 01/28/2018 1007   MCV 84 11/05/2011 1606   MCH 27.9 06/22/2018 0816   MCHC 33.8 06/22/2018 0816   RDW 13.2 06/22/2018 0816   RDW 14.3 01/28/2018 1007   RDW 13.0 11/05/2011 1606   LYMPHSABS 1,358 06/22/2018 0816   LYMPHSABS 1.5 01/28/2018 1007   LYMPHSABS 1.8 11/05/2011 1606   MONOABS 0.9 04/12/2018 1820   MONOABS 0.6 11/05/2011 1606   EOSABS 360 06/22/2018 0816   EOSABS 0.2 01/28/2018 1007   EOSABS 0.1 11/05/2011 1606   BASOSABS 50 06/22/2018 0816   BASOSABS 0.0 01/28/2018 1007   BASOSABS 0.0 11/05/2011 1606   Iron/TIBC/Ferritin/ %Sat No results found for: IRON, TIBC, FERRITIN, IRONPCTSAT Lipid Panel     Component Value Date/Time   CHOL 182 06/22/2018 0816   CHOL 175 05/13/2018 0845   TRIG 82 06/22/2018 0816   HDL 65 06/22/2018 0816   HDL 51 05/13/2018 0845   CHOLHDL 2.8 06/22/2018 0816   LDLCALC 100 (H) 06/22/2018 0816   Hepatic Function Panel     Component Value Date/Time   PROT 6.6 06/22/2018 0816   PROT 7.0 01/28/2018 1007   PROT 8.5 (H) 11/05/2011 1606   ALBUMIN 4.6 04/12/2018 1820  ALBUMIN 4.4 01/28/2018 1007   ALBUMIN 4.5 11/05/2011 1606   AST 17  06/22/2018 0816   AST 36 11/05/2011 1606   ALT 18 06/22/2018 0816   ALT 53 11/05/2011 1606   ALKPHOS 63 04/12/2018 1820   ALKPHOS 147 (H) 11/05/2011 1606   BILITOT 0.3 06/22/2018 0816   BILITOT 0.3 01/28/2018 1007   BILITOT 0.5 11/05/2011 1606      Component Value Date/Time   TSH 1.330 01/28/2018 1007   TSH 1.440 01/04/2016 0818    Ref. Range 05/13/2018 08:45  Vitamin D, 25-Hydroxy Latest Ref Range: 30.0 - 100.0 ng/mL 37.8     OBESITY BEHAVIORAL INTERVENTION VISIT  Today's visit was # 11   Starting weight: 249 lbs Starting date: 01/28/2018 Today's weight : 253 lbs Today's date: 09/03/2018 Total lbs lost to date: 0   ASK: We discussed the diagnosis of obesity with [place] today and Westley agreed to give [name] permission to discuss obesity behavioral modification therapy today.  ASSESS: Evens has the diagnosis of obesity and his BMI today is 33.39 Olman is in the action stage of change   ADVISE: Aidon was educated on the multiple health risks of obesity as well as the benefit of weight loss to improve his health. He was advised of the need for long term treatment and the importance of lifestyle modifications to improve his current health and to decrease his risk of future health problems.  AGREE: Multiple dietary modification options and treatment options were discussed and  Taysen agreed to follow the recommendations documented in the above note.  ARRANGE: [name] was educated on the importance of frequent visits to treat obesity as outlined per CMS and USPSTF guidelines and agreed to schedule his next follow up appointment today.  Corey Skains, am acting as transcriptionist for Abby Potash, PA-C I, Abby Potash, PA-C have reviewed above note and agree with its content

## 2018-09-14 ENCOUNTER — Other Ambulatory Visit: Payer: Self-pay | Admitting: Nurse Practitioner

## 2018-09-14 DIAGNOSIS — M1990 Unspecified osteoarthritis, unspecified site: Secondary | ICD-10-CM

## 2018-09-15 MED ORDER — ETODOLAC 500 MG PO TABS: 500.0000 mg | ORAL_TABLET | Freq: Two times a day (BID) | ORAL | 2 refills | Status: DC | PRN

## 2018-09-23 NOTE — Progress Notes (Signed)
Office: [phone]  /  Fax: [phone]    Date: September 28, 2018   Time Seen: 7:57am Duration: 28 minutes Provider: Glennie Isle, Psy.D. Type of Session: Individual Therapy  Type of Contact: Face-to-face  HPI: Nedleywas referred by Abby Potash, PA-Cdue to depression with emotional eating behaviors. Per the note for the visit withTracey Corrinne Eagle August 05, 2018, "Nedleyis struggling with emotional eating and using food for comfort to the extent that it is negatively impacting hishealth. Heoften snacks when heis not hungry. Nedleysometimes feels heis out of control and then feels guilty that hemade poor food choices. Hehas been working on behavior modification techniques to help reduce hisemotional eating and has been somewhat successful.Prospero is on Wellbutrin 150mg  and Cymbalta 60mg . He reports "eating all day" recently due to feeling down. He is not as socially active currently."In addition, per the note for the initial visit withDr. Desmond Dike Jan 28, 2018,Bhavik has beenheavy most of his life and he started gaining weight around[age] or 43 years old. Hisheaviest weight ever was 280pounds.During the initial appointment with Dr. Ellen Henri experiencing the following: significant food cravings issues; frequently drinking liquids with calories; frequently making poor food choices; having problems with excessive hunger; frequently eating larger portionsthan normal;binge eating behaviors; andstruggling with emotional eating.  During the initial appointment with this provider,Nedleyreported, "I just go, go, and go[referring to eating]."His last episode of emotional eating and binge eating was last week when he consumed a bag of trailmix.Ramone could not recall what precipitated the eating episode.He indicated he tends to crave sweets. Moreover, Nedleyreported a belief that emotional eating started in high school whereas binge eating  started in middle school. He denied a history of purging and engagement other compensatory strategies.Nedleyalso denied ever being diagnosed with an eating disorder. Furthermore, Nedleywas asked to complete a questionnaire assessing various behaviors related to emotional eating. Nedleyendorsed the following: overeat when you are celebrating, experience food cravings on a regular basis, use food to help you cope with emotional situations, find food is comforting to you, overeat when you are angry or upset, overeat when you are worried about something, overeat frequently when you are bored or lonely, overeat when you are alone, but eat much less when you are with other people, eat to help you stay awake and eat as a reward.  During today's appointment, Welles reported increased awareness regarding triggers for emotional eating resulting in him making better choices.   Session Content: Session focused on the following treatment goal: decrease emotional eating. The session was initiated with the administration of the PHQ-9 and GAD-7, as well as a brief check-in. Parkline shared about recent events, including the holidays. He discussed eating protein options when he experienced physical and emotional hunger. Regarding triggers for emotional eating, he observed stress being a trigger when at work. Overall, Daelen discussed increased awareness of his eating patterns resulting in him making better choices. Osiel also discussed the weather impacting his mood. This provider discussed the option for a referral for longer-term therapeutic services. He reported having the option of seeing his psychiatrist and another counselor if needed. Thus, declined a referral at this time. Regarding pleasurable activities, [place] shared working on motorcycles, reading, and playing with the dogs. He was encouraged to continue engaging in pleasurable activities, as he described them having a positive impact on his mood. Filbert agreed.  Moreover, psychoeducation regarding mindfulness was provided. A handout was provided to [facility] with further information regarding mindfulness, including exercises. This provider also explained the benefit of mindfulness  as it relates to emotional eating. Sujay was encouraged to engage in the provided exercises between now and the next appointment with this provider. Baltazar agreed. He was also led through an exercise involving his five senses.  Niraj was receptive to today's session as evidenced by openness to sharing, responsiveness to feedback, and engagement in the mindfulness exercise.  Mental Status Examination: Rylyn arrived early for the appointment. He presented as appropriately dressed and groomed. Kavari appeared his stated age and demonstrated adequate orientation to time, place, person, and purpose of the appointment. He also demonstrated appropriate eye contact. No psychomotor abnormalities or behavioral peculiarities noted. His mood was euthymic with congruent affect. His thought processes were logical, linear, and goal-directed. No hallucinations, delusions, bizarre thinking or behavior reported or observed. Judgment, insight, and impulse control appeared to be grossly intact. There was no evidence of paraphasias (i.e., errors in speech, gross mispronunciations, and word substitutions), repetition deficits, or disturbances in volume or prosody (i.e., rhythm and intonation). There was no evidence of attention or memory impairments. Reuben denied current suicidal and homicidal ideation, intent or plan.  Structured Assessment Results: The Patient Health Questionnaire-9 (PHQ-9) is a self-report measure that assesses symptoms and severity of depression over the course of the last two weeks. Johnaton obtained a score of 4 suggesting minimal depression. Avery finds the endorsed symptoms to be not difficult at all. Depression screen PHQ 2/9 09/28/2018  Decreased Interest 1  Down, Depressed, Hopeless 1    PHQ - 2 Score 2  Altered sleeping 0  Tired, decreased energy 1  Change in appetite 1  Feeling bad or failure about yourself  0  Trouble concentrating 0  Moving slowly or fidgety/restless 0  Suicidal thoughts 0  PHQ-9 Score 4  Difficult doing work/chores -   The Generalized Anxiety Disorder-7 (GAD-7) is a brief self-report measure that assesses symptoms of anxiety over the course of the last two weeks. Chilton obtained a score of 1 suggesting minimal anxiety. GAD 7 : Generalized Anxiety Score 09/28/2018  Nervous, Anxious, on Edge 0  Control/stop worrying 0  Worry too much - different things 0  Trouble relaxing 0  Restless 0  Easily annoyed or irritable 1  Afraid - awful might happen 0  Total GAD 7 Score 1  Anxiety Difficulty Not difficult at all   Interventions:  Administration of PHQ-9 and GAD-7 for symptom monitoring Review of content from the previous session Empathic reflections and validation Psychoeducation regarding mindfulness Mindfulness exercise Discussed option for a referral for longer-term therapeutic services Positive reinforcement Brief chart review  DSM-5 Diagnosis: 311 (F32.8) Other Specified Depressive Disorder, Emotional Eating Behaviors  Treatment Goal & Progress: During the initial appointment with this provider, the following treatment goal was established: decrease emotional eating. Yuji has demonstrated progress in his goal as evidenced by increased awareness of hunger patterns and triggers for emotional eating. Nazaire reported engaging in learned skills, and discussed making better choices (e.g., eating protein) when experiencing emotional hunger.   Plan: Rilley continues to appear able and willing to participate as evidenced by engagement in reciprocal conversation, and asking questions for clarification as appropriate. The next appointment will be scheduled in two weeks. The next session will focus further on mindfulness.

## 2018-09-28 ENCOUNTER — Ambulatory Visit (INDEPENDENT_AMBULATORY_CARE_PROVIDER_SITE_OTHER): Payer: 59 | Admitting: Psychology

## 2018-09-28 DIAGNOSIS — F3289 Other specified depressive episodes: Secondary | ICD-10-CM

## 2018-10-05 ENCOUNTER — Ambulatory Visit (INDEPENDENT_AMBULATORY_CARE_PROVIDER_SITE_OTHER): Payer: 59 | Admitting: Physician Assistant

## 2018-10-05 ENCOUNTER — Encounter (INDEPENDENT_AMBULATORY_CARE_PROVIDER_SITE_OTHER): Payer: Self-pay | Admitting: Physician Assistant

## 2018-10-05 VITALS — BP 144/88 | HR 81 | Temp 98.0°F | Ht 73.0 in | Wt 254.0 lb

## 2018-10-05 DIAGNOSIS — Z6833 Body mass index (BMI) 33.0-33.9, adult: Secondary | ICD-10-CM | POA: Diagnosis not present

## 2018-10-05 DIAGNOSIS — E669 Obesity, unspecified: Secondary | ICD-10-CM

## 2018-10-05 DIAGNOSIS — E7849 Other hyperlipidemia: Secondary | ICD-10-CM | POA: Diagnosis not present

## 2018-10-05 NOTE — Progress Notes (Signed)
Office: [phone]  /  Fax: [phone]   HPI:   Chief Complaint: OBESITY Ashok is here to discuss his progress with his obesity treatment plan. He is on the keep a food journal with 550 to 770 calories and 45+ grams of protein and follow the Category 4 plan and is following his eating plan approximately 75 % of the time. He states he is walking 20-30 minutes 3-4 times per week. Cline reports that he is feeling better overall and eating closer to the plan. He is snacking less but still snacking on foods that he feels are not the best. His weight is 254 lb (115.2 kg) today and has gained 1 lbs since his last visit. He has lost 0 lbs since starting treatment with [name].  Hyperlipidemia Numa has hyperlipidemia and has been trying to improve his cholesterol levels with intensive lifestyle modification including a low saturated fat diet, exercise and weight loss. He denies any chest pain. He is not taking any medication. His level is not at goal.  ASSESSMENT AND PLAN:  Other hyperlipidemia  Class 1 obesity with serious comorbidity and body mass index (BMI) of 33.0 to 33.9 in adult, unspecified obesity type  PLAN:  Hyperlipidemia Haleem was informed of the American Heart Association Guidelines emphasizing intensive lifestyle modifications as the first line treatment for hyperlipidemia. We discussed many lifestyle modifications today in depth, and Wyndell will continue to work on decreasing saturated fats such as fatty red meat, butter and many fried foods. He will also increase vegetables and lean protein in his diet and continue to work on exercise and weight loss efforts. Arsenio agrees to follow up with our clinic in 3 weeks.  I spent > than 50% of the 15 minute visit on counseling as documented in the note.  Obesity Horice is currently in the action stage of change. As such, his goal is to continue with weight loss efforts He has agreed to follow the Category 4 plan Karlo has been  instructed to work up to a goal of 150 minutes of combined cardio and strengthening exercise per week for weight loss and overall health benefits. We discussed the following Behavioral Modification Strategies today: work on meal planning and easy cooking plans and better snacking choices  Tayon has agreed to follow up with our clinic in 3 weeks. He was informed of the importance of frequent follow up visits to maximize his success with intensive lifestyle modifications for his multiple health conditions.  ALLERGIES: Allergies  Allergen Reactions  . Tomato Other (See Comments)    GI distress    MEDICATIONS: Current Outpatient Medications on File Prior to Visit  Medication Sig Dispense Refill  . buPROPion (WELLBUTRIN XL) 150 MG 24 hr tablet Take 150 mg by mouth daily.   12  . DULoxetine (CYMBALTA) 60 MG capsule Take 60 mg by mouth daily.     [name] esomeprazole (NEXIUM) 40 MG capsule TAKE 1 CAPSULE BY MOUTH ONCE DAILY 90 capsule 3  . etodolac (LODINE) 500 MG tablet Take 1 tablet (500 mg total) by mouth 2 (two) times daily as needed (moderate pain). 60 tablet 2  . metFORMIN (GLUCOPHAGE) 1000 MG tablet Take 1 tablet (1,000 mg total) by mouth 2 (two) times daily with a meal. 60 tablet 0  . Multiple Vitamin (MULTIVITAMIN) capsule Take 1 capsule by mouth daily.    . valsartan-hydrochlorothiazide (DIOVAN-HCT) 160-12.5 MG tablet Take 1 tablet by mouth every morning. 90 tablet 3  . Vitamin D, Ergocalciferol, (DRISDOL) 1.25 MG (  50000 UT) CAPS capsule Take 1 capsule (50,000 Units total) by mouth every 7 (seven) days. 8 capsule 0   No current facility-administered medications on file prior to visit.     PAST MEDICAL HISTORY: Past Medical History:  Diagnosis Date  . Chest pain   . Family history of adverse reaction to anesthesia    MOM-N/V, DAD-HAS PROBLEMS WITH NOVICAINE  . Food allergy   . GERD (gastroesophageal reflux disease)   . History of hiatal hernia   . History of kidney stones    H/O  .  Insomnia   . Joint pain   . Plantar fasciitis   . Seasonal allergies   . Tendonitis of foot    left    PAST SURGICAL HISTORY: Past Surgical History:  Procedure Laterality Date  . CARPAL TUNNEL RELEASE Right 07/03/2017   Procedure: CARPAL TUNNEL RELEASE;  Surgeon: Hessie Knows, MD;  Location: [facility] OR[facility];  Service: Orthopedics;  Laterality: Right;  . clavide surgery  2005  . CYSTOSCOPY/URETEROSCOPY/HOLMIUM LASER/STENT PLACEMENT Right 05/22/2018   Procedure: CYSTOSCOPY/URETEROSCOPY/HOLMIUM LASER/STENT PLACEMENT;  Surgeon: Billey Co, MD;  Location: [facility];  Service: Urology;  Laterality: Right;  . KNEE SURGERY Right 2013  . SHOULDER SURGERY Right 2008    SOCIAL HISTORY: Social History   Tobacco Use  . Smoking status: Never Smoker  . Smokeless tobacco: Never Used  Substance Use Topics  . Alcohol use: Yes    Alcohol/week: 0.0 standard drinks    Comment: RARE  . Drug use: No    FAMILY HISTORY: Family History  Problem Relation Age of Onset  . Cancer Mother        colon/rectal  . Hyperlipidemia Mother   . Cancer Brother        hodgekins  . Cancer Maternal Grandfather        lung  . Stroke Maternal Grandfather   . Diabetes Father   . Hypertension Father   . Hyperlipidemia Father   . Heart disease Father   . Alcoholism Father     ROS: Review of Systems  Constitutional: Negative for weight loss.  Cardiovascular: Negative for chest pain and claudication.    PHYSICAL EXAM: Blood pressure (!) 144/88, pulse 81, temperature 98 F (36.7 C), temperature source Oral, height 6\' 1"  (1.854 m), weight 254 lb (115.2 kg), SpO2 99 %. Body mass index is 33.51 kg/m. Physical Exam Vitals signs reviewed.  Constitutional:      Appearance: Normal appearance. He is obese.  Cardiovascular:     Rate and Rhythm: Normal rate.  Pulmonary:     Effort: Pulmonary effort is normal.  Musculoskeletal: Normal range of motion.  Skin:    General: Skin is warm and dry.  Neurological:      Mental Status: He is alert and oriented to person, place, and time.  Psychiatric:        Mood and Affect: Mood normal.     RECENT LABS AND TESTS: BMET    Component Value Date/Time   NA 142 06/22/2018 0816   NA 138 01/28/2018 1007   NA 138 05/20/2013 0815   K 4.3 06/22/2018 0816   K 3.7 05/20/2013 0815   CL 105 06/22/2018 0816   CL 106 05/20/2013 0815   CO2 28 06/22/2018 0816   CO2 26 05/20/2013 0815   GLUCOSE 112 (H) 06/22/2018 0816   GLUCOSE 101 (H) 05/20/2013 0815   BUN 18 06/22/2018 0816   BUN 18 01/28/2018 1007   BUN 20 (H) 05/20/2013 0815  CREATININE 0.99 06/22/2018 0816   CALCIUM 9.4 06/22/2018 0816   CALCIUM 8.9 05/20/2013 0815   GFRNONAA 92 06/22/2018 0816   GFRAA 107 06/22/2018 0816   Lab Results  Component Value Date   HGBA1C 6.3 (H) 06/22/2018   HGBA1C 6.2 (H) 01/28/2018   HGBA1C 6.7 11/07/2017   HGBA1C 6.3 05/08/2017   HGBA1C 6.5 11/06/2016   Lab Results  Component Value Date   INSULIN 11.6 05/13/2018   INSULIN 10.5 01/28/2018   CBC    Component Value Date/Time   WBC 6.2 06/22/2018 0816   RBC 4.98 06/22/2018 0816   HGB 13.9 06/22/2018 0816   HGB 14.6 01/28/2018 1007   HCT 41.1 06/22/2018 0816   HCT 43.5 01/28/2018 1007   PLT 249 06/22/2018 0816   PLT 250 06/23/2017 1104   MCV 82.5 06/22/2018 0816   MCV 84 01/28/2018 1007   MCV 84 11/05/2011 1606   MCH 27.9 06/22/2018 0816   MCHC 33.8 06/22/2018 0816   RDW 13.2 06/22/2018 0816   RDW 14.3 01/28/2018 1007   RDW 13.0 11/05/2011 1606   LYMPHSABS 1,358 06/22/2018 0816   LYMPHSABS 1.5 01/28/2018 1007   LYMPHSABS 1.8 11/05/2011 1606   MONOABS 0.9 04/12/2018 1820   MONOABS 0.6 11/05/2011 1606   EOSABS 360 06/22/2018 0816   EOSABS 0.2 01/28/2018 1007   EOSABS 0.1 11/05/2011 1606   BASOSABS 50 06/22/2018 0816   BASOSABS 0.0 01/28/2018 1007   BASOSABS 0.0 11/05/2011 1606   Iron/TIBC/Ferritin/ %Sat No results found for: IRON, TIBC, FERRITIN, IRONPCTSAT Lipid Panel     Component Value  Date/Time   CHOL 182 06/22/2018 0816   CHOL 175 05/13/2018 0845   TRIG 82 06/22/2018 0816   HDL 65 06/22/2018 0816   HDL 51 05/13/2018 0845   CHOLHDL 2.8 06/22/2018 0816   LDLCALC 100 (H) 06/22/2018 0816   Hepatic Function Panel     Component Value Date/Time   PROT 6.6 06/22/2018 0816   PROT 7.0 01/28/2018 1007   PROT 8.5 (H) 11/05/2011 1606   ALBUMIN 4.6 04/12/2018 1820   ALBUMIN 4.4 01/28/2018 1007   ALBUMIN 4.5 11/05/2011 1606   AST 17 06/22/2018 0816   AST 36 11/05/2011 1606   ALT 18 06/22/2018 0816   ALT 53 11/05/2011 1606   ALKPHOS 63 04/12/2018 1820   ALKPHOS 147 (H) 11/05/2011 1606   BILITOT 0.3 06/22/2018 0816   BILITOT 0.3 01/28/2018 1007   BILITOT 0.5 11/05/2011 1606      Component Value Date/Time   TSH 1.330 01/28/2018 1007   TSH 1.440 01/04/2016 0818      OBESITY BEHAVIORAL INTERVENTION VISIT  Today's visit was # 12   Starting weight: 249 lbs Starting date: 01/28/2018 Today's weight :: 254 lbs Today's date: 10/05/2018 Total lbs lost to date: 0   ASK: We discussed the diagnosis of obesity with [place] today and Tajon agreed to give [name] permission to discuss obesity behavioral modification therapy today.  ASSESS: Justan has the diagnosis of obesity and his BMI today is 33.52 Lael is in the action stage of change   ADVISE: Colleen was educated on the multiple health risks of obesity as well as the benefit of weight loss to improve his health. He was advised of the need for long term treatment and the importance of lifestyle modifications to improve his current health and to decrease his risk of future health problems.  AGREE: Multiple dietary modification options and treatment options were discussed and  Jamarea agreed to  follow the recommendations documented in the above note.  ARRANGE: Pilar was educated on the importance of frequent visits to treat obesity as outlined per CMS and USPSTF guidelines and agreed to schedule his next follow  up appointment today.  I, Tammy Wysor, am acting as Location manager for [employer], PA-C I, Abby Potash, PA-C have reviewed above note and agree with its content

## 2018-10-05 NOTE — Progress Notes (Signed)
Office: [phone]  /  Fax: [phone]    Date: October 12, 2018   Time Seen: 7:55am Duration: 31 minutes  Provider: Glennie Isle, Psy.D. Type of Session: Individual Therapy  Type of Contact: Face-to-face  Session Content: [name] is a 43 year old male presenting for a follow-up appointment to address the previously established goal of decreasing emotional eating. The session was initiated with the administration of the PHQ-9 and GAD-7, as well as a brief check-in. [name] reported an increase in his mood since November, and attributed it to being able to go outside. Regarding eating, he noted it is "okay" and added, "I'm back on track." However he explained, "Around 3 o'clock, I'm starving" and indicated it is sometimes physical and sometimes emotional hunger. Thus, this explored. It was determined that on some days there may be emotional hunger secondary to out of habit eating and stress, whereas others days it may likely be due to physical hunger. As such, a plan to utilize snack calories was developed. Moreover, [name] discussed experiencing emotional hunger, but denied emotional eating aside from one episode. Regarding mindfulness, [name] shared practicing a "little bit." He wad led through another exercise, "A Taste of Mindfulness" and following the exercise, he noted, "This is good stuff." He was provided with a handout for the aforementioned exercise. In addition, psychoeducation regarding the hunger and satisfaction scale was provided, and a handout was given to [facility] This provider also discussed the utilization of YouTube for mindfulness exercises. Furthermore, this provider discussed termination planning, including the option for a referral for longer-term therapeutic services to focus further on emotional eating. Suede was receptive to today's session as evidenced by openness to sharing, responsiveness to feedback, and engagement in the mindfulness exercise.  Mental Status  Examination: [name] arrived early for the appointment. He presented as appropriately dressed and groomed. [name] appeared his stated age and demonstrated adequate orientation to time, place, person, and purpose of the appointment. He also demonstrated appropriate eye contact. No psychomotor abnormalities or behavioral peculiarities noted. His mood was euthymic with congruent affect. His thought processes were logical, linear, and goal-directed. No hallucinations, delusions, bizarre thinking or behavior reported or observed. Judgment, insight, and impulse control appeared to be grossly intact. There was no evidence of paraphasias (i.e., errors in speech, gross mispronunciations, and word substitutions), repetition deficits, or disturbances in volume or prosody (i.e., rhythm and intonation). There was no evidence of attention or memory impairments. [name] denied current suicidal and homicidal ideation, plan and intent.   Structured Assessment Results: The Patient Health Questionnaire-9 (PHQ-9) is a self-report measure that assesses symptoms and severity of depression over the course of the last two weeks. Chancelor obtained a score of 4 suggesting minimal depression.  Depression screen [facility] 2/9 10/12/2018  Decreased Interest 1  Down, Depressed, Hopeless 0  PHQ - 2 Score 1  Altered sleeping 0  Tired, decreased energy 1  Change in appetite 1  Feeling bad or failure about yourself  0  Trouble concentrating 1  Moving slowly or fidgety/restless 0  Suicidal thoughts 0  PHQ-9 Score 4  Difficult doing work/chores -  Some recent data might be hidden   The Generalized Anxiety Disorder-7 (GAD-7) is a brief self-report measure that assesses symptoms of anxiety over the course of the last two weeks. Tysin obtained a score of zero. GAD 7 : Generalized Anxiety Score 10/12/2018  Nervous, Anxious, on Edge 0  Control/stop worrying 0  Worry too much - different things 0  Trouble relaxing 0  Restless 0  Easily annoyed  or irritable 0  Afraid - awful might happen 0  Total GAD 7 Score 0  Anxiety Difficulty -   Interventions:  Administration of PHQ-9 and GAD-7 for symptom monitoring Review of content from the previous session Empathic reflections and validation Mindfulness exercise Termination planning Discussed option for a referral for longer-term therapeutic services Positive reinforcement Psychoeducation regarding the hunger and satisfaction scale Brief chart review  DSM-5 Diagnosis: 311 (F32.8) Other Specified Depressive Disorder, Emotional Eating Behaviors  Treatment Goal & Progress: During the initial appointment with this provider, the following treatment goal was established: decrease emotional eating. [name] has demonstrated progress in his goal as evidenced by increased awareness of hunger patterns and triggers for emotional eating. Additionally, [name] reports he continues to engage in learned skills. Also, there appears to be an overall reduction in emotional eating episodes, as evidenced by his self-report during today's appointment.  Plan: [name] continues to appear able and willing to participate as evidenced by engagement in reciprocal conversation, and asking questions for clarification as appropriate. The next appointment will be scheduled in two weeks. The next session will focus on reviewing learned skills, and the introduction of thought defusion.

## 2018-10-12 ENCOUNTER — Ambulatory Visit (INDEPENDENT_AMBULATORY_CARE_PROVIDER_SITE_OTHER): Payer: 59 | Admitting: Psychology

## 2018-10-12 DIAGNOSIS — F3289 Other specified depressive episodes: Secondary | ICD-10-CM

## 2018-10-14 ENCOUNTER — Other Ambulatory Visit (INDEPENDENT_AMBULATORY_CARE_PROVIDER_SITE_OTHER): Payer: Self-pay | Admitting: Physician Assistant

## 2018-10-14 DIAGNOSIS — E119 Type 2 diabetes mellitus without complications: Secondary | ICD-10-CM

## 2018-10-14 MED ORDER — METFORMIN HCL 1000 MG PO TABS: 1000.0000 mg | ORAL_TABLET | Freq: Two times a day (BID) | ORAL | 0 refills | Status: DC

## 2018-10-27 ENCOUNTER — Encounter (INDEPENDENT_AMBULATORY_CARE_PROVIDER_SITE_OTHER): Payer: Self-pay | Admitting: Physician Assistant

## 2018-10-27 ENCOUNTER — Ambulatory Visit (INDEPENDENT_AMBULATORY_CARE_PROVIDER_SITE_OTHER): Payer: 59 | Admitting: Psychology

## 2018-10-27 ENCOUNTER — Ambulatory Visit (INDEPENDENT_AMBULATORY_CARE_PROVIDER_SITE_OTHER): Payer: 59 | Admitting: Physician Assistant

## 2018-10-27 VITALS — BP 126/83 | HR 87 | Temp 97.8°F | Ht 73.0 in | Wt 252.0 lb

## 2018-10-27 DIAGNOSIS — E559 Vitamin D deficiency, unspecified: Secondary | ICD-10-CM

## 2018-10-27 DIAGNOSIS — Z9189 Other specified personal risk factors, not elsewhere classified: Secondary | ICD-10-CM

## 2018-10-27 DIAGNOSIS — F3289 Other specified depressive episodes: Secondary | ICD-10-CM

## 2018-10-27 DIAGNOSIS — E669 Obesity, unspecified: Secondary | ICD-10-CM | POA: Diagnosis not present

## 2018-10-27 DIAGNOSIS — I10 Essential (primary) hypertension: Secondary | ICD-10-CM | POA: Diagnosis not present

## 2018-10-27 DIAGNOSIS — Z6833 Body mass index (BMI) 33.0-33.9, adult: Secondary | ICD-10-CM | POA: Diagnosis not present

## 2018-10-27 MED ORDER — VITAMIN D (ERGOCALCIFEROL) 1.25 MG (50000 UNIT) PO CAPS: 50000.0000 [IU] | ORAL_CAPSULE | ORAL | 0 refills | Status: DC

## 2018-10-27 NOTE — Progress Notes (Signed)
Office: [phone]  /  Fax: [phone]    Date: October 27, 2018   Time Seen: 9:32am Duration: 28 minutes Provider: Glennie Isle, Psy.D. Type of Session: Individual Therapy  Type of Contact: Face-to-face  Session Content: Edwyn is a 43 y.o. male presenting for a follow-up appointment to address the previously established treatment goal of decreasing emotional eating. The session was initiated with the administration of the PHQ-9 and GAD-7, as well as a brief check-in. Traevon shared about his recent trip to [place], and noted his mood recently has been "pretty good." Regarding eating, Rowdy shared, "I'm staying on track for the most part." He explained he continues to crave sweets, but has been able to distract himself. Yong disclosed two days of emotional eating since the last appointment with this provider due to boredom, but described making healthier choices. Nevertheless, he discussed losing two pounds since the last appointment with this provider. Based on the aforementioned, this provider engaged Aliou in problem solving to use snack calories for snacks, specifically for sweets. Moreover, Amilio indicated he has been practicing mindfulness in the morning but noted experiencing stress later in the day. As such, he was encouraged to engage in short mindfulness exercise mid-day as well; Rydell agreed.  Furthermore, psychoeducation regarding thought defusion, including its impact on emotional eating was provided. Colin was led through a thought defusion exercise, and a handout with various exercises was provided. Jaret was encouraged to engage in the thought defusion exercises between now and the next appointment with this provider. Laray agreed. Nasif used the thought, "I am not fast enough" for the exercise. Following the exercise, Arek indicated, "It's neat." He further explained that as the exercise progressed, he did not feel as "heavy." Advit was receptive to today's  session as evidenced by openness to sharing, responsiveness to feedback, and engagement in the thought defusion exercise.  Mental Status Examination: Kelan arrived on time for the appointment. He presented as appropriately dressed and groomed. Sawyer appeared his stated age and demonstrated adequate orientation to time, place, person, and purpose of the appointment. He also demonstrated appropriate eye contact. No psychomotor abnormalities or behavioral peculiarities noted. His mood was euthymic with congruent affect. His thought processes were logical, linear, and goal-directed. No hallucinations, delusions, bizarre thinking or behavior reported or observed. Judgment, insight, and impulse control appeared to be grossly intact. There was no evidence of paraphasias (i.e., errors in speech, gross mispronunciations, and word substitutions), repetition deficits, or disturbances in volume or prosody (i.e., rhythm and intonation). There was no evidence of attention or memory impairments. Taha denied current suicidal and homicidal ideation, plan and intent.   Structured Assessment Results: The Patient Health Questionnaire-9 (PHQ-9) is a self-report measure that assesses symptoms and severity of depression over the course of the last two weeks. Jackson obtained a score of 3 suggesting minimal depression. Andrian finds the endorsed symptoms to be not difficult at all. Depression screen [facility] 2/9 10/27/2018  Decreased Interest 0  Down, Depressed, Hopeless 1  PHQ - 2 Score 1  Altered sleeping 0  Tired, decreased energy 1  Change in appetite 1  Feeling bad or failure about yourself  0  Trouble concentrating 0  Moving slowly or fidgety/restless 0  Suicidal thoughts 0  PHQ-9 Score 3  Difficult doing work/chores -  Some recent data might be hidden   The Generalized Anxiety Disorder-7 (GAD-7) is a brief self-report measure that assesses symptoms of anxiety over the course of the last two weeks. Zarif obtained a  score of 2 suggesting minimal anxiety. GAD 7 : Generalized Anxiety Score 10/27/2018  Nervous, Anxious, on Edge 1  Control/stop worrying 0  Worry too much - different things 0  Trouble relaxing 1  Restless 0  Easily annoyed or irritable 0  Afraid - awful might happen 0  Total GAD 7 Score 2  Anxiety Difficulty Not difficult at all   Interventions:  Administration of PHQ-9 and GAD-7 for symptom monitoring Review of content from the previous session Empathic reflections and validation Problem solving Psychoeducation regarding thought defusion Thought defusion exercise Positive reinforcement Brief chart review  DSM-5 Diagnosis: 311 (F32.8) Other Specified Depressive Disorder, Emotional Eating Behaviors  Treatment Goal & Progress: During the initial appointment with this provider, the following treatment goal was established: decrease emotional eating. Selestino has demonstrated progress in his goal as evidenced by increased awareness of hunger patterns and triggers for emotional eating. Despite the recent episodes of emotional eating, there appears to be an overall reduction. Koben also continues to demonstrate willingness to engage in learned skills.   Plan: Rebecca continues to appear able and willing to participate as evidenced by engagement in reciprocal conversation, and asking questions for clarification as appropriate. The next appointment will be scheduled in two weeks. The next session will focus further on thought defusion, and possible termination.

## 2018-10-27 NOTE — Progress Notes (Signed)
Office: [phone]  /  Fax: [phone]   HPI:   Chief Complaint: OBESITY [name] is here to discuss his progress with his obesity treatment plan. He is on the Category 4 plan and is following his eating plan approximately 90% of the time. He states he is walking 20-30 minutes 3-4 times per week. [name] did well with weight loss. He reports that he has been overeating his snack calories at times.   His weight is 252 lb (114.3 kg) today and has had a weight loss of 2 pounds over a period of 3 weeks since his last visit. He has lost 0 lbs since starting treatment with [name].  Vitamin D deficiency [name] has a diagnosis of Vitamin D deficiency. He is currently taking prescription Vit D and denies nausea, vomiting or muscle weakness.  Hypertension [name] is a 43 y.o. male with hypertension.  [name] denies chest pain. He is working weight loss to help control his blood pressure with the goal of decreasing his risk of heart attack and stroke. [name] blood pressure is currently controlled. [name] is on [facility].  At risk for cardiovascular disease [name] is at a higher than average risk for cardiovascular disease due to obesity. He currently denies any chest pain.  ASSESSMENT AND PLAN:  Vitamin D deficiency - Plan: Vitamin D, Ergocalciferol, (DRISDOL) 1.25 MG (50000 UT) CAPS capsule  Essential hypertension  At risk for heart disease  Class 1 obesity with serious comorbidity and body mass index (BMI) of 33.0 to 33.9 in adult, unspecified obesity type  PLAN:  Vitamin D Deficiency [name] was informed that low Vitamin D levels contributes to fatigue and are associated with obesity, breast, and colon cancer. He agrees to continue to take prescription Vit D @ 50,000 IU every week #4 with no refills and will follow-up for routine testing of Vitamin D, at least 2-3 times per year. He was informed of the risk of over-replacement of Vitamin D and agrees to not increase his dose  unless he discusses this with [name] first. [name] agrees to follow-up with our clinic in 3 weeks.  Hypertension We discussed sodium restriction, working on healthy weight loss, and a regular exercise program as the means to achieve improved blood pressure control. [name] agreed with this plan and agreed to follow up as directed. We will continue to monitor his blood pressure as well as his progress with the above lifestyle modifications. He will continue his medications as prescribed and will watch for signs of hypotension as he continues his lifestyle modifications.  Cardiovascular risk counseling [name] was given extended (15 minutes) coronary artery disease prevention counseling today. He is 43 y.o. male and has risk factors for heart disease including obesity. We discussed intensive lifestyle modifications today with an emphasis on specific weight loss instructions and strategies. Pt was also informed of the importance of increasing exercise and decreasing saturated fats to help prevent heart disease.  Obesity [name] is currently in the action stage of change. As such, his goal is to continue with weight loss efforts. He has agreed to follow the Category 4 plan. [name] has been instructed to work up to a goal of 150 minutes of combined cardio and strengthening exercise per week for weight loss and overall health benefits. We discussed the following Behavioral Modification Strategies today: work on meal planning and easy cooking plans and making better snacking choices.  [name] has agreed to follow-up with our clinic in 3 weeks. He was informed of the  importance of frequent follow up visits to maximize his success with intensive lifestyle modifications for his multiple health conditions.  ALLERGIES: Allergies  Allergen Reactions  . Tomato Other (See Comments)    GI distress    MEDICATIONS: Current Outpatient Medications on File Prior to Visit  Medication Sig Dispense Refill  . buPROPion  (WELLBUTRIN XL) 150 MG 24 hr tablet Take 150 mg by mouth daily.   12  . DULoxetine (CYMBALTA) 60 MG capsule Take 60 mg by mouth daily.     [name] esomeprazole (NEXIUM) 40 MG capsule TAKE 1 CAPSULE BY MOUTH ONCE DAILY 90 capsule 3  . etodolac (LODINE) 500 MG tablet Take 1 tablet (500 mg total) by mouth 2 (two) times daily as needed (moderate pain). 60 tablet 2  . metFORMIN (GLUCOPHAGE) 1000 MG tablet Take 1 tablet (1,000 mg total) by mouth 2 (two) times daily with a meal. 60 tablet 0  . Multiple Vitamin (MULTIVITAMIN) capsule Take 1 capsule by mouth daily.    . valsartan-hydrochlorothiazide (DIOVAN-HCT) 160-12.5 MG tablet Take 1 tablet by mouth every morning. 90 tablet 3   No current facility-administered medications on file prior to visit.     PAST MEDICAL HISTORY: Past Medical History:  Diagnosis Date  . Chest pain   . Family history of adverse reaction to anesthesia    MOM-N/V, DAD-HAS PROBLEMS WITH NOVICAINE  . Food allergy   . GERD (gastroesophageal reflux disease)   . History of hiatal hernia   . History of kidney stones    H/O  . Insomnia   . Joint pain   . Plantar fasciitis   . Seasonal allergies   . Tendonitis of foot    left    PAST SURGICAL HISTORY: Past Surgical History:  Procedure Laterality Date  . CARPAL TUNNEL RELEASE Right 07/03/2017   Procedure: CARPAL TUNNEL RELEASE;  Surgeon: Hessie Knows, MD;  Location: [facility] OR[facility];  Service: Orthopedics;  Laterality: Right;  . clavide surgery  2005  . CYSTOSCOPY/URETEROSCOPY/HOLMIUM LASER/STENT PLACEMENT Right 05/22/2018   Procedure: CYSTOSCOPY/URETEROSCOPY/HOLMIUM LASER/STENT PLACEMENT;  Surgeon: Billey Co, MD;  Location: [facility] OR[facility];  Service: Urology;  Laterality: Right;  . KNEE SURGERY Right 2013  . SHOULDER SURGERY Right 2008    SOCIAL HISTORY: Social History   Tobacco Use  . Smoking status: Never Smoker  . Smokeless tobacco: Never Used  Substance Use Topics  . Alcohol use: Yes    Alcohol/week: 0.0 standard  drinks    Comment: RARE  . Drug use: No    FAMILY HISTORY: Family History  Problem Relation Age of Onset  . Cancer Mother        colon/rectal  . Hyperlipidemia Mother   . Cancer Brother        hodgekins  . Cancer Maternal Grandfather        lung  . Stroke Maternal Grandfather   . Diabetes Father   . Hypertension Father   . Hyperlipidemia Father   . Heart disease Father   . Alcoholism Father     ROS: Review of Systems  Constitutional: Positive for weight loss.  Cardiovascular: Negative for chest pain.  Gastrointestinal: Negative for nausea and vomiting.  Musculoskeletal:       Negative for muscle weakness.  Endo/Heme/Allergies:       Negative for hypoglycemia.   PHYSICAL EXAM: Blood pressure 126/83, pulse 87, temperature 97.8 F (36.6 C), temperature source Oral, height 6\' 1"  (1.854 m), weight 252 lb (114.3 kg), SpO2 99 %. Body mass index is 33.25  kg/m. Physical Exam Vitals signs reviewed.  Constitutional:      Appearance: Normal appearance. He is obese.  Cardiovascular:     Rate and Rhythm: Normal rate.     Pulses: Normal pulses.  Pulmonary:     Effort: Pulmonary effort is normal.     Breath sounds: Normal breath sounds.  Musculoskeletal: Normal range of motion.  Skin:    General: Skin is warm and dry.  Neurological:     Mental Status: He is alert and oriented to person, place, and time.  Psychiatric:        Behavior: Behavior normal.   RECENT LABS AND TESTS: BMET    Component Value Date/Time   NA 142 06/22/2018 0816   NA 138 01/28/2018 1007   NA 138 05/20/2013 0815   K 4.3 06/22/2018 0816   K 3.7 05/20/2013 0815   CL 105 06/22/2018 0816   CL 106 05/20/2013 0815   CO2 28 06/22/2018 0816   CO2 26 05/20/2013 0815   GLUCOSE 112 (H) 06/22/2018 0816   GLUCOSE 101 (H) 05/20/2013 0815   BUN 18 06/22/2018 0816   BUN 18 01/28/2018 1007   BUN 20 (H) 05/20/2013 0815   CREATININE 0.99 06/22/2018 0816   CALCIUM 9.4 06/22/2018 0816   CALCIUM 8.9  05/20/2013 0815   GFRNONAA 92 06/22/2018 0816   GFRAA 107 06/22/2018 0816   Lab Results  Component Value Date   HGBA1C 6.3 (H) 06/22/2018   HGBA1C 6.2 (H) 01/28/2018   HGBA1C 6.7 11/07/2017   HGBA1C 6.3 05/08/2017   HGBA1C 6.5 11/06/2016   Lab Results  Component Value Date   INSULIN 11.6 05/13/2018   INSULIN 10.5 01/28/2018   CBC    Component Value Date/Time   WBC 6.2 06/22/2018 0816   RBC 4.98 06/22/2018 0816   HGB 13.9 06/22/2018 0816   HGB 14.6 01/28/2018 1007   HCT 41.1 06/22/2018 0816   HCT 43.5 01/28/2018 1007   PLT 249 06/22/2018 0816   PLT 250 06/23/2017 1104   MCV 82.5 06/22/2018 0816   MCV 84 01/28/2018 1007   MCV 84 11/05/2011 1606   MCH 27.9 06/22/2018 0816   MCHC 33.8 06/22/2018 0816   RDW 13.2 06/22/2018 0816   RDW 14.3 01/28/2018 1007   RDW 13.0 11/05/2011 1606   LYMPHSABS 1,358 06/22/2018 0816   LYMPHSABS 1.5 01/28/2018 1007   LYMPHSABS 1.8 11/05/2011 1606   MONOABS 0.9 04/12/2018 1820   MONOABS 0.6 11/05/2011 1606   EOSABS 360 06/22/2018 0816   EOSABS 0.2 01/28/2018 1007   EOSABS 0.1 11/05/2011 1606   BASOSABS 50 06/22/2018 0816   BASOSABS 0.0 01/28/2018 1007   BASOSABS 0.0 11/05/2011 1606   Iron/TIBC/Ferritin/ %Sat No results found for: IRON, TIBC, FERRITIN, IRONPCTSAT Lipid Panel     Component Value Date/Time   CHOL 182 06/22/2018 0816   CHOL 175 05/13/2018 0845   TRIG 82 06/22/2018 0816   HDL 65 06/22/2018 0816   HDL 51 05/13/2018 0845   CHOLHDL 2.8 06/22/2018 0816   LDLCALC 100 (H) 06/22/2018 0816   Hepatic Function Panel     Component Value Date/Time   PROT 6.6 06/22/2018 0816   PROT 7.0 01/28/2018 1007   PROT 8.5 (H) 11/05/2011 1606   ALBUMIN 4.6 04/12/2018 1820   ALBUMIN 4.4 01/28/2018 1007   ALBUMIN 4.5 11/05/2011 1606   AST 17 06/22/2018 0816   AST 36 11/05/2011 1606   ALT 18 06/22/2018 0816   ALT 53 11/05/2011 1606   ALKPHOS 63  04/12/2018 1820   ALKPHOS 147 (H) 11/05/2011 1606   BILITOT 0.3 06/22/2018 0816    BILITOT 0.3 01/28/2018 1007   BILITOT 0.5 11/05/2011 1606      Component Value Date/Time   TSH 1.330 01/28/2018 1007   TSH 1.440 01/04/2016 0818   Results for Cousins, Maxon C "NED" (MRN [id number]) as of 10/27/2018 11:42  Ref. Range 05/13/2018 08:45  Vitamin D, 25-Hydroxy Latest Ref Range: 30.0 - 100.0 ng/mL 37.8   OBESITY BEHAVIORAL INTERVENTION VISIT  Today's visit was #13  Starting weight: 249 Starting date: 01/28/2018 Today's weight: 19 Today's date: 10/27/2018 Total lbs lost to date: 0  ASK: We discussed the diagnosis of obesity with [name] today and [name] agreed to give [name] permission to discuss obesity behavioral modification therapy today.  ASSESS: [name] has the diagnosis of obesity and his BMI today is @ 33.25. [name] is in the action stage of change.   ADVISE: [name] was educated on the multiple health risks of obesity as well as the benefit of weight loss to improve his health. He was advised of the need for long term treatment and the importance of lifestyle modifications to improve his current health and to decrease his risk of future health problems.  AGREE: Multiple dietary modification options and treatment options were discussed and  [name] agreed to follow the recommendations documented in the above note.  ARRANGE: [name] was educated on the importance of frequent visits to treat obesity as outlined per CMS and USPSTF guidelines and agreed to schedule his next follow up appointment today.  Migdalia Dk, am acting as transcriptionist for Abby Potash, PA-C I, Abby Potash, PA-C have reviewed above note and agree with its content

## 2018-11-13 DIAGNOSIS — E119 Type 2 diabetes mellitus without complications: Secondary | ICD-10-CM | POA: Diagnosis not present

## 2018-11-16 NOTE — Progress Notes (Signed)
Office: [phone]  /  Fax: [phone]    Date: November 17, 2018   Time Seen: 8:30am Duration: 28 minutes Provider: Glennie Isle, Psy.D. Type of Session: Individual Therapy  Type of Contact: Face-to-face  Session Content: [name] is a 43 y.o. male presenting for a follow-up appointment to address the previously established treatment goal of decreasing emotional eating as well as termination of services with this provider. The session was initiated with the administration of the PHQ-9 and GAD-7, as well as a brief check-in. [name] shared about ongoing stressors related to work and his wife's well-being. Moreover, he shared he has been "sticking" to the meal plan, and he lost one pound. [name] shared his meal plan was increased by 200 snack calories today during his appointment with Abby Potash, PA-C. He denied episodes of emotional eating since last appointment with this provider.'s appointment then focused on reviewing thought defusion, as notably acknowledged he did not practice the exercise previously discussed.  Following reviewing thought defusion, he identified opportunities in the past couple weeks where he could has benefited from practicing thought defusion. He was led through an additional exercise today, "Leaves on A Stream." Following the exercise, he indicated, "Good stuff." However, he discussed his mind repeatedly focused on all the things he needed to get done. Thus, this provider and Kyrese discussed how thoughts about his day can also be incorporated in the exercise. [name] was receptive to today's session as evidenced by openness to sharing, responsiveness to feedback, and engagement in the thought defusion exercise.  Mental Status Examination: [name] arrived early for the appointment. He presented as appropriately dressed and groomed. [name] appeared his stated age and demonstrated adequate orientation to time, place, person, and purpose of the appointment. He also demonstrated  appropriate eye contact. No psychomotor abnormalities or behavioral peculiarities noted. His mood was euthymic with congruent affect. His thought processes were logical, linear, and goal-directed. No hallucinations, delusions, bizarre thinking or behavior reported or observed. Judgment, insight, and impulse control appeared to be grossly intact. There was no evidence of paraphasias (i.e., errors in speech, gross mispronunciations, and word substitutions), repetition deficits, or disturbances in volume or prosody (i.e., rhythm and intonation). There was no evidence of attention or memory impairments. [name] denied current suicidal and homicidal ideation, plan and intent.   Structured Assessment Results: The Patient Health Questionnaire-9 (PHQ-9) is a self-report measure that assesses symptoms and severity of depression over the course of the last two weeks. Blaike obtained a score of 3 suggesting minimal depression. [name] finds the endorsed symptoms to be not difficult at all. Depression screen PHQ 2/9 11/17/2018  Decreased Interest 1  Down, Depressed, Hopeless 0  PHQ - 2 Score 1  Altered sleeping 0  Tired, decreased energy 1  Change in appetite 0  Feeling bad or failure about yourself  0  Trouble concentrating 1  Moving slowly or fidgety/restless 0  Suicidal thoughts 0  PHQ-9 Score 3  Difficult doing work/chores -  Some recent data might be hidden   The Generalized Anxiety Disorder-7 (GAD-7) is a brief self-report measure that assesses symptoms of anxiety over the course of the last two weeks. [name] obtained a score of 2 suggesting minimal anxiety. GAD 7 : Generalized Anxiety Score 11/17/2018  Nervous, Anxious, on Edge 0  Control/stop worrying 0  Worry too much - different things 1  Trouble relaxing 0  Restless 0  Easily annoyed or irritable 1  Afraid - awful might happen 0  Total GAD 7 Score 2  Anxiety Difficulty Not difficult at all   Interventions:  Administration of PHQ-9 and GAD-7  for symptom monitoring Review of content from the previous session Empathic reflections and validation Thought defusion exercise Positive reinforcement Brief chart review  DSM-5 Diagnosis: 311 (F32.8) Other Specified Depressive Disorder, Emotional Eating Behaviors  Treatment Goal & Progress: During the initial appointment with this provider, the following treatment goal was established: decrease emotional eating. [name] has demonstrated progress in his goal as evidenced by increased awareness of hunger patterns and triggers for emotional eating. During today's appointment, he denied engaging in emotional eating since the last appointment with this provider, and he continues to demonstrate willingness to engage in learned skills.  Plan: Today was [name]'s last appointment with this provider. He verbally acknowledged understanding that he may request a referral for longer-term therapeutic services in the future.

## 2018-11-17 ENCOUNTER — Ambulatory Visit (INDEPENDENT_AMBULATORY_CARE_PROVIDER_SITE_OTHER): Payer: 59 | Admitting: Psychology

## 2018-11-17 ENCOUNTER — Ambulatory Visit (INDEPENDENT_AMBULATORY_CARE_PROVIDER_SITE_OTHER): Payer: 59 | Admitting: Physician Assistant

## 2018-11-17 VITALS — BP 137/88 | HR 82 | Temp 97.9°F | Ht 73.0 in | Wt 251.0 lb

## 2018-11-17 DIAGNOSIS — R0602 Shortness of breath: Secondary | ICD-10-CM

## 2018-11-17 DIAGNOSIS — E559 Vitamin D deficiency, unspecified: Secondary | ICD-10-CM | POA: Diagnosis not present

## 2018-11-17 DIAGNOSIS — E119 Type 2 diabetes mellitus without complications: Secondary | ICD-10-CM | POA: Diagnosis not present

## 2018-11-17 DIAGNOSIS — F3289 Other specified depressive episodes: Secondary | ICD-10-CM | POA: Diagnosis not present

## 2018-11-17 DIAGNOSIS — Z9189 Other specified personal risk factors, not elsewhere classified: Secondary | ICD-10-CM

## 2018-11-17 DIAGNOSIS — Z6833 Body mass index (BMI) 33.0-33.9, adult: Secondary | ICD-10-CM

## 2018-11-17 DIAGNOSIS — E669 Obesity, unspecified: Secondary | ICD-10-CM | POA: Diagnosis not present

## 2018-11-17 MED ORDER — METFORMIN HCL 1000 MG PO TABS: 1000.0000 mg | ORAL_TABLET | Freq: Two times a day (BID) | ORAL | 0 refills | Status: DC

## 2018-11-17 NOTE — Progress Notes (Signed)
Office: [phone]  /  Fax: [phone]   HPI:   Chief Complaint: OBESITY [name] is here to discuss his progress with his obesity treatment plan. He is on the Category 4 plan and is following his eating plan approximately 95% of the time. He states he is walking 6 miles 7 times per week and is doing strengthening exercises. [name] did well with weight loss. He is frustrated as he has been following the pan very closely. His weight is 251 lb (113.9 kg) today and has had a weight loss of 1 pound over a period of 3 weeks since his last visit. He has lost 0 lbs since starting treatment with [name].  Diabetes II [name] has a diagnosis of diabetes type II and is currently taking metformin. [name] does not report checking his blood sugars. Last A1c was reported to be 6.3 by his PCP last week. He has been working on intensive lifestyle modifications including diet, exercise, and weight loss to help control his blood glucose levels. He denies nausea, vomiting, diarrhea, and polydipsia.  Vitamin D deficiency [name] has a diagnosis of Vitamin D deficiency. He is currently taking prescription Vit D and denies nausea, vomiting or muscle weakness.  At risk for osteopenia and osteoporosis [name] is at higher risk of osteopenia and osteoporosis due to vitamin D deficiency.   Dyspnea with Exercise [name] notes increasing shortness of breath with exercising and seems to be worsening over time with weight gain. He notes getting out of breath sooner with activity than he used to. [name] denies shortness of breath at rest or orthopnea and denies dizziness or lightheadedness.  ASSESSMENT AND PLAN:  Type 2 diabetes mellitus without complication, without long-term current use of insulin ([name]) - Plan: metFORMIN (GLUCOPHAGE) 1000 MG tablet  Vitamin D deficiency  Shortness of breath on exertion  At risk for osteoporosis  Class 1 obesity with serious comorbidity and body mass index (BMI) of 33.0 to 33.9 in  adult, unspecified obesity type  PLAN:  Diabetes II [name] has been given extensive diabetes education by myself today including ideal fasting and post-prandial blood glucose readings, individual ideal HgA1c goals  and hypoglycemia prevention. We discussed the importance of good blood sugar control to decrease the likelihood of diabetic complications such as nephropathy, neuropathy, limb loss, blindness, coronary artery disease, and death. We discussed the importance of intensive lifestyle modification including diet, exercise and weight loss as the first line treatment for diabetes. [name] was given a refill on his metformin #60 with no refills and agrees to follow-up with our clinic in 3 weeks.  Vitamin D Deficiency [name] was informed that low Vitamin D levels contributes to fatigue and are associated with obesity, breast, and colon cancer. He agrees to continue to take prescription Vit D @ 50,000 IU every week and will follow-up for routine testing of Vitamin D at his next visit. He was informed of the risk of over-replacement of Vitamin D and agrees to not increase her dose unless she discusses this with [name] first. [name] agrees to follow-up with our clinic in 3 weeks.  At risk for osteopenia and osteoporosis [name] was given extended  (15 minutes) osteoporosis prevention counseling today. [name] is at risk for osteopenia and osteoporsis due to his Vitamin D deficiency. He was encouraged to take his Vitamin D and follow his higher calcium diet and increase strengthening exercise to help strengthen his bones and decrease his risk of osteopenia and osteoporosis.  Dyspnea with exercise [name]'s shortness of breath appears  to be obesity related and exercise induced. The indirect calorimeter results showed VO2 of 471 and a REE of 3280. He has agreed to work on weight loss and gradually increase exercise to treat his exercise induced shortness of breath. If [name] follows our instructions and loses  weight without improvement of his shortness of breath, we will plan to refer to pulmonology. [name] agrees to this plan.  Obesity [name] is currently in the action stage of change. As such, his goal is to continue with weight loss efforts. He has agreed to increase to the Category 4 plan + 200 calories. [name] has been instructed to work up to a goal of 150 minutes of combined cardio and strengthening exercise per week for weight loss and overall health benefits. We discussed the following Behavioral Modification Strategies today: increasing lean protein intake and work on meal planning and easy cooking plans.  [name] has agreed to follow-up with our clinic in 3 weeks. He was informed of the importance of frequent follow up visits to maximize his success with intensive lifestyle modifications for his multiple health conditions.  ALLERGIES: Allergies  Allergen Reactions  . Tomato Other (See Comments)    GI distress    MEDICATIONS: Current Outpatient Medications on File Prior to Visit  Medication Sig Dispense Refill  . buPROPion (WELLBUTRIN XL) 150 MG 24 hr tablet Take 150 mg by mouth daily.   12  . DULoxetine (CYMBALTA) 60 MG capsule Take 60 mg by mouth daily.     [name] esomeprazole (NEXIUM) 40 MG capsule TAKE 1 CAPSULE BY MOUTH ONCE DAILY 90 capsule 3  . etodolac (LODINE) 500 MG tablet Take 1 tablet (500 mg total) by mouth 2 (two) times daily as needed (moderate pain). 60 tablet 2  . Multiple Vitamin (MULTIVITAMIN) capsule Take 1 capsule by mouth daily.    . valsartan-hydrochlorothiazide (DIOVAN-HCT) 160-12.5 MG tablet Take 1 tablet by mouth every morning. 90 tablet 3  . Vitamin D, Ergocalciferol, (DRISDOL) 1.25 MG (50000 UT) CAPS capsule Take 1 capsule (50,000 Units total) by mouth every 7 (seven) days. 4 capsule 0   No current facility-administered medications on file prior to visit.     PAST MEDICAL HISTORY: Past Medical History:  Diagnosis Date  . Chest pain   . Family history of  adverse reaction to anesthesia    MOM-N/V, DAD-HAS PROBLEMS WITH NOVICAINE  . Food allergy   . GERD (gastroesophageal reflux disease)   . History of hiatal hernia   . History of kidney stones    H/O  . Insomnia   . Joint pain   . Plantar fasciitis   . Seasonal allergies   . Tendonitis of foot    left    PAST SURGICAL HISTORY: Past Surgical History:  Procedure Laterality Date  . CARPAL TUNNEL RELEASE Right 07/03/2017   Procedure: CARPAL TUNNEL RELEASE;  Surgeon: Hessie Knows, MD;  Location: [facility];  Service: Orthopedics;  Laterality: Right;  . clavide surgery  2005  . CYSTOSCOPY/URETEROSCOPY/HOLMIUM LASER/STENT PLACEMENT Right 05/22/2018   Procedure: CYSTOSCOPY/URETEROSCOPY/HOLMIUM LASER/STENT PLACEMENT;  Surgeon: Billey Co, MD;  Location: [facility];  Service: Urology;  Laterality: Right;  . KNEE SURGERY Right 2013  . SHOULDER SURGERY Right 2008    SOCIAL HISTORY: Social History   Tobacco Use  . Smoking status: Never Smoker  . Smokeless tobacco: Never Used  Substance Use Topics  . Alcohol use: Yes    Alcohol/week: 0.0 standard drinks    Comment: RARE  . Drug use: No  FAMILY HISTORY: Family History  Problem Relation Age of Onset  . Cancer Mother        colon/rectal  . Hyperlipidemia Mother   . Cancer Brother        hodgekins  . Cancer Maternal Grandfather        lung  . Stroke Maternal Grandfather   . Diabetes Father   . Hypertension Father   . Hyperlipidemia Father   . Heart disease Father   . Alcoholism Father    ROS: Review of Systems  Constitutional: Positive for weight loss.  Gastrointestinal: Negative for diarrhea, nausea and vomiting.  Musculoskeletal:       Negative for muscle weakness.  Endo/Heme/Allergies: Negative for polydipsia.       Negative for hypoglycemia.   PHYSICAL EXAM: Blood pressure 137/88, pulse 82, temperature 97.9 F (36.6 C), temperature source Oral, height 6\' 1"  (1.854 m), weight 251 lb (113.9 kg), SpO2 99 %. Body  mass index is 33.12 kg/m. Physical Exam Vitals signs reviewed.  Constitutional:      Appearance: Normal appearance. He is obese.  Cardiovascular:     Rate and Rhythm: Normal rate.     Pulses: Normal pulses.  Pulmonary:     Effort: Pulmonary effort is normal.     Breath sounds: Normal breath sounds.  Musculoskeletal: Normal range of motion.  Skin:    General: Skin is warm and dry.  Neurological:     Mental Status: He is alert and oriented to person, place, and time.  Psychiatric:        Behavior: Behavior normal.   RECENT LABS AND TESTS: BMET    Component Value Date/Time   NA 142 06/22/2018 0816   NA 138 01/28/2018 1007   NA 138 05/20/2013 0815   K 4.3 06/22/2018 0816   K 3.7 05/20/2013 0815   CL 105 06/22/2018 0816   CL 106 05/20/2013 0815   CO2 28 06/22/2018 0816   CO2 26 05/20/2013 0815   GLUCOSE 112 (H) 06/22/2018 0816   GLUCOSE 101 (H) 05/20/2013 0815   BUN 18 06/22/2018 0816   BUN 18 01/28/2018 1007   BUN 20 (H) 05/20/2013 0815   CREATININE 0.99 06/22/2018 0816   CALCIUM 9.4 06/22/2018 0816   CALCIUM 8.9 05/20/2013 0815   GFRNONAA 92 06/22/2018 0816   GFRAA 107 06/22/2018 0816   Lab Results  Component Value Date   HGBA1C 6.3 (H) 06/22/2018   HGBA1C 6.2 (H) 01/28/2018   HGBA1C 6.7 11/07/2017   HGBA1C 6.3 05/08/2017   HGBA1C 6.5 11/06/2016   Lab Results  Component Value Date   INSULIN 11.6 05/13/2018   INSULIN 10.5 01/28/2018   CBC    Component Value Date/Time   WBC 6.2 06/22/2018 0816   RBC 4.98 06/22/2018 0816   HGB 13.9 06/22/2018 0816   HGB 14.6 01/28/2018 1007   HCT 41.1 06/22/2018 0816   HCT 43.5 01/28/2018 1007   PLT 249 06/22/2018 0816   PLT 250 06/23/2017 1104   MCV 82.5 06/22/2018 0816   MCV 84 01/28/2018 1007   MCV 84 11/05/2011 1606   MCH 27.9 06/22/2018 0816   MCHC 33.8 06/22/2018 0816   RDW 13.2 06/22/2018 0816   RDW 14.3 01/28/2018 1007   RDW 13.0 11/05/2011 1606   LYMPHSABS 1,358 06/22/2018 0816   LYMPHSABS 1.5 01/28/2018  1007   LYMPHSABS 1.8 11/05/2011 1606   MONOABS 0.9 04/12/2018 1820   MONOABS 0.6 11/05/2011 1606   EOSABS 360 06/22/2018 0816   EOSABS 0.2 01/28/2018  1007   EOSABS 0.1 11/05/2011 1606   BASOSABS 50 06/22/2018 0816   BASOSABS 0.0 01/28/2018 1007   BASOSABS 0.0 11/05/2011 1606   Iron/TIBC/Ferritin/ %Sat No results found for: IRON, TIBC, FERRITIN, IRONPCTSAT Lipid Panel     Component Value Date/Time   CHOL 182 06/22/2018 0816   CHOL 175 05/13/2018 0845   TRIG 82 06/22/2018 0816   HDL 65 06/22/2018 0816   HDL 51 05/13/2018 0845   CHOLHDL 2.8 06/22/2018 0816   LDLCALC 100 (H) 06/22/2018 0816   Hepatic Function Panel     Component Value Date/Time   PROT 6.6 06/22/2018 0816   PROT 7.0 01/28/2018 1007   PROT 8.5 (H) 11/05/2011 1606   ALBUMIN 4.6 04/12/2018 1820   ALBUMIN 4.4 01/28/2018 1007   ALBUMIN 4.5 11/05/2011 1606   AST 17 06/22/2018 0816   AST 36 11/05/2011 1606   ALT 18 06/22/2018 0816   ALT 53 11/05/2011 1606   ALKPHOS 63 04/12/2018 1820   ALKPHOS 147 (H) 11/05/2011 1606   BILITOT 0.3 06/22/2018 0816   BILITOT 0.3 01/28/2018 1007   BILITOT 0.5 11/05/2011 1606      Component Value Date/Time   TSH 1.330 01/28/2018 1007   TSH 1.440 01/04/2016 0818    Ref. Range 05/13/2018 08:45  Vitamin D, 25-Hydroxy Latest Ref Range: 30.0 - 100.0 ng/mL 37.8   OBESITY BEHAVIORAL INTERVENTION VISIT  Today's visit was #14  Starting weight: 249 lbs Starting date: 01/28/2018 Today's weight: 241 lbs  Today's date: 11/17/2018 Total lbs lost to date: 0    11/17/2018  Height 6\' 1"  (1.854 m)  Weight 251 lb (113.9 kg)  BMI (Calculated) 33.12  BLOOD PRESSURE - SYSTOLIC 045  BLOOD PRESSURE - DIASTOLIC 88   Body Fat % 31 %  Total Body Water (lbs) 117.6 lbs  RMR 3280   ASK: We discussed the diagnosis of obesity with [name] today and [name] agreed to give [name] permission to discuss obesity behavioral modification therapy today.  ASSESS: [name] has the diagnosis of obesity  and his BMI today is 33.12. [name] is in the action stage of change.   ADVISE: [name] was educated on the multiple health risks of obesity as well as the benefit of weight loss to improve his health. He was advised of the need for long term treatment and the importance of lifestyle modifications to improve his current health and to decrease his risk of future health problems.  AGREE: Multiple dietary modification options and treatment options were discussed and  [name] agreed to follow the recommendations documented in the above note.  ARRANGE: [name] was educated on the importance of frequent visits to treat obesity as outlined per CMS and USPSTF guidelines and agreed to schedule his next follow up appointment today.  Migdalia Dk, am acting as transcriptionist for Abby Potash, PA-C I, Abby Potash, PA-C have reviewed above note and agree with its content

## 2018-11-19 DIAGNOSIS — E119 Type 2 diabetes mellitus without complications: Secondary | ICD-10-CM | POA: Diagnosis not present

## 2018-11-19 DIAGNOSIS — I10 Essential (primary) hypertension: Secondary | ICD-10-CM | POA: Diagnosis not present

## 2018-11-19 LAB — HM DIABETES FOOT EXAM: HM Diabetic Foot Exam: NORMAL

## 2018-12-07 DIAGNOSIS — J3089 Other allergic rhinitis: Secondary | ICD-10-CM

## 2018-12-07 MED ORDER — IPRATROPIUM BROMIDE 0.06 % NA SOLN: 2.0000 | Freq: Three times a day (TID) | NASAL | 3 refills | Status: DC | PRN

## 2018-12-08 ENCOUNTER — Other Ambulatory Visit: Payer: Self-pay

## 2018-12-08 ENCOUNTER — Encounter (INDEPENDENT_AMBULATORY_CARE_PROVIDER_SITE_OTHER): Payer: Self-pay

## 2018-12-08 ENCOUNTER — Encounter (INDEPENDENT_AMBULATORY_CARE_PROVIDER_SITE_OTHER): Payer: Self-pay | Admitting: Physician Assistant

## 2018-12-08 ENCOUNTER — Ambulatory Visit (INDEPENDENT_AMBULATORY_CARE_PROVIDER_SITE_OTHER): Payer: 59 | Admitting: Physician Assistant

## 2018-12-08 DIAGNOSIS — E559 Vitamin D deficiency, unspecified: Secondary | ICD-10-CM

## 2018-12-08 DIAGNOSIS — E669 Obesity, unspecified: Secondary | ICD-10-CM

## 2018-12-08 DIAGNOSIS — Z6833 Body mass index (BMI) 33.0-33.9, adult: Secondary | ICD-10-CM | POA: Diagnosis not present

## 2018-12-08 DIAGNOSIS — E119 Type 2 diabetes mellitus without complications: Secondary | ICD-10-CM | POA: Diagnosis not present

## 2018-12-08 MED ORDER — VITAMIN D (ERGOCALCIFEROL) 1.25 MG (50000 UNIT) PO CAPS: 50000.0000 [IU] | ORAL_CAPSULE | ORAL | 0 refills | Status: DC

## 2018-12-08 NOTE — Progress Notes (Signed)
Office: [phone]  /  Fax: [phone]   HPI:   Chief Complaint: OBESITY [name] is here to discuss his progress with his obesity treatment plan. [name] is on the Category 4 plan + 200 calories and is following his eating plan approximately 90-95 % of the time. [name] states [name] is walking, doing resistance, and riding off the road motorcycle quite a bit. [name]'s face time meeting through visual did not work. [name] reports that [name] believes that [name] has lost 1 pound according to the scale at work. [name] is able to get in all the food on the Category 4 plan, but is having trouble finding meat at the grocery store.  Diabetes II [name] has a diagnosis of diabetes type II. [name] states fasting BGs range between 110 and 120, (110 glucose this morning before eating). [name] denies hypoglycemia or polyphagia and [name] is on metformin. [name] has been working on intensive lifestyle modifications including diet, exercise, and weight loss to help control his blood glucose levels.  Vitamin D Deficiency [name] has a diagnosis of vitamin D deficiency. [name] is currently taking prescription Vit D and denies nausea, vomiting or muscle weakness.  ALLERGIES: Allergies  Allergen Reactions  . Tomato Other (See Comments)    GI distress    MEDICATIONS: Current Outpatient Medications on File Prior to Visit  Medication Sig Dispense Refill  . buPROPion (WELLBUTRIN XL) 150 MG 24 hr tablet Take 150 mg by mouth daily.   12  . DULoxetine (CYMBALTA) 60 MG capsule Take 60 mg by mouth daily.     [name] esomeprazole (NEXIUM) 40 MG capsule TAKE 1 CAPSULE BY MOUTH ONCE DAILY 90 capsule 3  . etodolac (LODINE) 500 MG tablet Take 1 tablet (500 mg total) by mouth 2 (two) times daily as needed (moderate pain). 60 tablet 2  . ipratropium (ATROVENT) 0.06 % nasal spray Place 2 sprays into both nostrils 3 (three) times daily as needed for rhinitis. 15 mL 3  . metFORMIN (GLUCOPHAGE) 1000 MG tablet Take 1 tablet (1,000 mg total) by mouth 2 (two) times daily  with a meal. 60 tablet 0  . Multiple Vitamin (MULTIVITAMIN) capsule Take 1 capsule by mouth daily.    . valsartan-hydrochlorothiazide (DIOVAN-HCT) 160-12.5 MG tablet Take 1 tablet by mouth every morning. 90 tablet 3   No current facility-administered medications on file prior to visit.     PAST MEDICAL HISTORY: Past Medical History:  Diagnosis Date  . Chest pain   . Family history of adverse reaction to anesthesia    MOM-N/V, DAD-HAS PROBLEMS WITH NOVICAINE  . Food allergy   . GERD (gastroesophageal reflux disease)   . History of hiatal hernia   . History of kidney stones    H/O  . Insomnia   . Joint pain   . Plantar fasciitis   . Seasonal allergies   . Tendonitis of foot    left    PAST SURGICAL HISTORY: Past Surgical History:  Procedure Laterality Date  . CARPAL TUNNEL RELEASE Right 07/03/2017   Procedure: CARPAL TUNNEL RELEASE;  Surgeon: Hessie Knows, MD;  Location: [facility];  Service: Orthopedics;  Laterality: Right;  . clavide surgery  2005  . CYSTOSCOPY/URETEROSCOPY/HOLMIUM LASER/STENT PLACEMENT Right 05/22/2018   Procedure: CYSTOSCOPY/URETEROSCOPY/HOLMIUM LASER/STENT PLACEMENT;  Surgeon: Billey Co, MD;  Location: [facility];  Service: Urology;  Laterality: Right;  . KNEE SURGERY Right 2013  . SHOULDER SURGERY Right 2008    SOCIAL HISTORY: Social History   Tobacco Use  . Smoking status: Never  Smoker  . Smokeless tobacco: Never Used  Substance Use Topics  . Alcohol use: Yes    Alcohol/week: 0.0 standard drinks    Comment: RARE  . Drug use: No    FAMILY HISTORY: Family History  Problem Relation Age of Onset  . Cancer Mother        colon/rectal  . Hyperlipidemia Mother   . Cancer Brother        hodgekins  . Cancer Maternal Grandfather        lung  . Stroke Maternal Grandfather   . Diabetes Father   . Hypertension Father   . Hyperlipidemia Father   . Heart disease Father   . Alcoholism Father     ROS: Review of Systems  Constitutional:  Positive for weight loss.  Gastrointestinal: Negative for nausea and vomiting.  Musculoskeletal:       Negative muscle weakness  Endo/Heme/Allergies:       Negative hypoglycemia Negative polyphagia    PHYSICAL EXAM: There were no vitals taken for this visit. There is no height or weight on file to calculate BMI. Physical Exam  RECENT LABS AND TESTS: BMET    Component Value Date/Time   NA 142 06/22/2018 0816   NA 138 01/28/2018 1007   NA 138 05/20/2013 0815   K 4.3 06/22/2018 0816   K 3.7 05/20/2013 0815   CL 105 06/22/2018 0816   CL 106 05/20/2013 0815   CO2 28 06/22/2018 0816   CO2 26 05/20/2013 0815   GLUCOSE 112 (H) 06/22/2018 0816   GLUCOSE 101 (H) 05/20/2013 0815   BUN 18 06/22/2018 0816   BUN 18 01/28/2018 1007   BUN 20 (H) 05/20/2013 0815   CREATININE 0.99 06/22/2018 0816   CALCIUM 9.4 06/22/2018 0816   CALCIUM 8.9 05/20/2013 0815   GFRNONAA 92 06/22/2018 0816   GFRAA 107 06/22/2018 0816   Lab Results  Component Value Date   HGBA1C 6.3 (H) 06/22/2018   HGBA1C 6.2 (H) 01/28/2018   HGBA1C 6.7 11/07/2017   HGBA1C 6.3 05/08/2017   HGBA1C 6.5 11/06/2016   Lab Results  Component Value Date   INSULIN 11.6 05/13/2018   INSULIN 10.5 01/28/2018   CBC    Component Value Date/Time   WBC 6.2 06/22/2018 0816   RBC 4.98 06/22/2018 0816   HGB 13.9 06/22/2018 0816   HGB 14.6 01/28/2018 1007   HCT 41.1 06/22/2018 0816   HCT 43.5 01/28/2018 1007   PLT 249 06/22/2018 0816   PLT 250 06/23/2017 1104   MCV 82.5 06/22/2018 0816   MCV 84 01/28/2018 1007   MCV 84 11/05/2011 1606   MCH 27.9 06/22/2018 0816   MCHC 33.8 06/22/2018 0816   RDW 13.2 06/22/2018 0816   RDW 14.3 01/28/2018 1007   RDW 13.0 11/05/2011 1606   LYMPHSABS 1,358 06/22/2018 0816   LYMPHSABS 1.5 01/28/2018 1007   LYMPHSABS 1.8 11/05/2011 1606   MONOABS 0.9 04/12/2018 1820   MONOABS 0.6 11/05/2011 1606   EOSABS 360 06/22/2018 0816   EOSABS 0.2 01/28/2018 1007   EOSABS 0.1 11/05/2011 1606    BASOSABS 50 06/22/2018 0816   BASOSABS 0.0 01/28/2018 1007   BASOSABS 0.0 11/05/2011 1606   Iron/TIBC/Ferritin/ %Sat No results found for: IRON, TIBC, FERRITIN, IRONPCTSAT Lipid Panel     Component Value Date/Time   CHOL 182 06/22/2018 0816   CHOL 175 05/13/2018 0845   TRIG 82 06/22/2018 0816   HDL 65 06/22/2018 0816   HDL 51 05/13/2018 0845   CHOLHDL 2.8 06/22/2018  0816   LDLCALC 100 (H) 06/22/2018 0816   Hepatic Function Panel     Component Value Date/Time   PROT 6.6 06/22/2018 0816   PROT 7.0 01/28/2018 1007   PROT 8.5 (H) 11/05/2011 1606   ALBUMIN 4.6 04/12/2018 1820   ALBUMIN 4.4 01/28/2018 1007   ALBUMIN 4.5 11/05/2011 1606   AST 17 06/22/2018 0816   AST 36 11/05/2011 1606   ALT 18 06/22/2018 0816   ALT 53 11/05/2011 1606   ALKPHOS 63 04/12/2018 1820   ALKPHOS 147 (H) 11/05/2011 1606   BILITOT 0.3 06/22/2018 0816   BILITOT 0.3 01/28/2018 1007   BILITOT 0.5 11/05/2011 1606      Component Value Date/Time   TSH 1.330 01/28/2018 1007   TSH 1.440 01/04/2016 0818    ASSESSMENT AND PLAN: Type 2 diabetes mellitus without complication, without long-term current use of insulin (HCC) - Plan: Comprehensive metabolic panel, Hemoglobin A1c, Insulin, random, Lipid Panel With LDL/HDL Ratio  Vitamin D deficiency - Plan: Vitamin D, Ergocalciferol, (DRISDOL) 1.25 MG (50000 UT) CAPS capsule, VITAMIN D 25 Hydroxy (Vit-D Deficiency, Fractures)  Class 1 obesity with serious comorbidity and body mass index (BMI) of 33.0 to 33.9 in adult, unspecified obesity type  PLAN:  Diabetes II [name] has been given extensive diabetes education by myself today including ideal fasting and post-prandial blood glucose readings, individual ideal Hgb A1c goals and hypoglycemia prevention. We discussed the importance of good blood sugar control to decrease the likelihood of diabetic complications such as nephropathy, neuropathy, limb loss, blindness, coronary artery disease, and death. We discussed  the importance of intensive lifestyle modification including diet, exercise and weight loss as the first line treatment for diabetes. [name] agrees to continue taking metformin, and will continue with weight loss. [name] agrees to follow up with our clinic in 3 weeks.  Vitamin D Deficiency [name] was informed that low vitamin D levels contributes to fatigue and are associated with obesity, breast, and colon cancer. [name] agrees to continue taking prescription Vit D @50 ,000 IU every week #4 and we will refill for 1 month. [name] will follow up for routine testing of vitamin D, at least 2-3 times per year. [name] was informed of the risk of over-replacement of vitamin D and agrees to not increase his dose unless [name] discusses this with [name] first. [name] agrees to follow up with our clinic in 3 weeks.  I spent > than 50% of the 15 minute visit on counseling as documented in the note.  Obesity [name] is currently in the action stage of change. As such, his goal is to continue with weight loss efforts [name] has agreed to follow the Category 4 plan [name] has been instructed to work up to a goal of 150 minutes of combined cardio and strengthening exercise per week for weight loss and overall health benefits. We discussed the following Behavioral Modification Strategies today: work on meal planning and easy cooking plans and keeping healthy foods in the home   [name] has agreed to follow up with our clinic in 3 weeks. [name] was informed of the importance of frequent follow up visits to maximize his success with intensive lifestyle modifications for his multiple health conditions.   OBESITY BEHAVIORAL INTERVENTION VISIT  Today's visit was # 15   Starting weight: 249 lbs Starting date: 01/28/18 Today's weight : N/A  Today's date: 12/08/2018 Total lbs lost to date: N/A    ASK: We discussed the diagnosis of obesity with [name] today and [name] agreed  to give [name] permission to discuss obesity behavioral  modification therapy today.  ASSESS: [name] has the diagnosis of obesity and his BMI today is N/A [name] is in the action stage of change   ADVISE: [name] was educated on the multiple health risks of obesity as well as the benefit of weight loss to improve his health. [name] was advised of the need for long term treatment and the importance of lifestyle modifications.  AGREE: Multiple dietary modification options and treatment options were discussed and  [name] agreed to the above obesity treatment plan.  [name], am acting as transcriptionist for Abby Potash, PA-C I, Abby Potash, PA-C have reviewed above note and agree with its content

## 2018-12-29 ENCOUNTER — Encounter (INDEPENDENT_AMBULATORY_CARE_PROVIDER_SITE_OTHER): Payer: Self-pay | Admitting: Physician Assistant

## 2018-12-29 ENCOUNTER — Other Ambulatory Visit: Payer: Self-pay

## 2018-12-29 ENCOUNTER — Ambulatory Visit (INDEPENDENT_AMBULATORY_CARE_PROVIDER_SITE_OTHER): Payer: 59 | Admitting: Physician Assistant

## 2018-12-29 DIAGNOSIS — E669 Obesity, unspecified: Secondary | ICD-10-CM

## 2018-12-29 DIAGNOSIS — E559 Vitamin D deficiency, unspecified: Secondary | ICD-10-CM | POA: Diagnosis not present

## 2018-12-29 DIAGNOSIS — E119 Type 2 diabetes mellitus without complications: Secondary | ICD-10-CM | POA: Diagnosis not present

## 2018-12-29 DIAGNOSIS — E7849 Other hyperlipidemia: Secondary | ICD-10-CM | POA: Diagnosis not present

## 2018-12-29 DIAGNOSIS — Z6833 Body mass index (BMI) 33.0-33.9, adult: Secondary | ICD-10-CM

## 2018-12-29 MED ORDER — METFORMIN HCL 1000 MG PO TABS: 1000.0000 mg | ORAL_TABLET | Freq: Two times a day (BID) | ORAL | 0 refills | Status: DC

## 2018-12-29 MED ORDER — VITAMIN D (ERGOCALCIFEROL) 1.25 MG (50000 UNIT) PO CAPS: 50000.0000 [IU] | ORAL_CAPSULE | ORAL | 0 refills | Status: DC

## 2018-12-29 NOTE — Progress Notes (Signed)
Office: [phone]  /  Fax: [phone] TeleHealth Visit:  [name] has verbally consented to this TeleHealth visit today. The patient is located at work, the provider is located at the [employer] [facility] office. The participants in this visit include the listed provider and patient. The visit was conducted today via FaceTime.  HPI:   Chief Complaint: OBESITY [name] is here to discuss his progress with his obesity treatment plan. He is on the Category 4 plan and is following his eating plan approximately 90-95% of the time. [place] he is walking 1.5-2 miles 2 times per week. [name] reports that he is doing well overall with the plan. He believes his weight loss has slowed down due to a decrease in his physical activity. We were unable to weigh the patient today for this TeleHealth visit. He feels as if he has lost 1 lb since his last visit. He has lost 0 lbs since starting treatment with [name].  Vitamin D deficiency [name] has a diagnosis of Vitamin D deficiency. He is currently taking prescription Vit D and denies nausea, vomiting or muscle weakness.  Diabetes II [name] has a diagnosis of diabetes type II. [name] states fasting blood sugars are in the range of 110 and 120. He denies any hypoglycemic episodes. Last A1c was reported to be 6.6 on 11/05/2018. He has been working on intensive lifestyle modifications including diet, exercise, and weight loss to help control his blood glucose levels. No polyphagia.  Hyperlipidemia [name] has hyperlipidemia and has been trying to improve his cholesterol levels with intensive lifestyle modification including a low saturated fat diet, exercise and weight loss. He is on no medication and denies any chest pain.  ASSESSMENT AND PLAN:  Vitamin D deficiency - Plan: VITAMIN D 25 Hydroxy (Vit-D Deficiency, Fractures), Vitamin D, Ergocalciferol, (DRISDOL) 1.25 MG (50000 UT) CAPS capsule  Type 2 diabetes mellitus without complication,  without long-term current use of insulin (HCC) - Plan: Insulin, random, Lipid Panel With LDL/HDL Ratio, metFORMIN (GLUCOPHAGE) 1000 MG tablet  Other hyperlipidemia  Class 1 obesity with serious comorbidity and body mass index (BMI) of 33.0 to 33.9 in adult, unspecified obesity type  PLAN:  Vitamin D Deficiency [name] was informed that low Vitamin D levels contributes to fatigue and are associated with obesity, breast, and colon cancer. He agrees to continue to take prescription Vit D @ 50,000 IU every week #4 with 0 refills and will follow-up for routine testing of Vitamin D, at least 2-3 times per year. He was informed of the risk of over-replacement of Vitamin D and agrees to not increase his dose unless he discusses this with [name] first. [name] agrees to follow-up with our clinic in 2 weeks.  Diabetes II [name] has been given extensive diabetes education by myself today including ideal fasting and post-prandial blood glucose readings, individual ideal Hgb A1c goals  and hypoglycemia prevention. We discussed the importance of good blood sugar control to decrease the likelihood of diabetic complications such as nephropathy, neuropathy, limb loss, blindness, coronary artery disease, and death. We discussed the importance of intensive lifestyle modification including diet, exercise and weight loss as the first line treatment for diabetes. [name] was given a refill on his metformin #60 with 0 refills and agrees to follow-up with our clinic in 2 weeks.  Hyperlipidemia [name] was informed of the American Heart Association Guidelines emphasizing intensive lifestyle modifications as the first line treatment for hyperlipidemia. We discussed many lifestyle modifications today in depth, and [name] will continue  to work on decreasing saturated fats such as fatty red meat, butter and many fried foods. He will also increase vegetables and lean protein in his diet and continue to work on exercise and weight loss  efforts.  Obesity [name] is currently in the action stage of change. As such, his goal is to continue with weight loss efforts. He has agreed to follow the Category 4 plan. [name] has been instructed to work up to a goal of 150 minutes of combined cardio and strengthening exercise per week for weight loss and overall health benefits. We discussed the following Behavioral Modification Strategies today: work on meal planning, easy cooking plans, and ways to avoid boredom eating.  [name] has agreed to follow-up with our clinic in 2 weeks. He was informed of the importance of frequent follow-up visits to maximize his success with intensive lifestyle modifications for his multiple health conditions.  ALLERGIES: Allergies  Allergen Reactions  . Tomato Other (See Comments)    GI distress    MEDICATIONS: Current Outpatient Medications on File Prior to Visit  Medication Sig Dispense Refill  . buPROPion (WELLBUTRIN XL) 150 MG 24 hr tablet Take 150 mg by mouth daily.   12  . DULoxetine (CYMBALTA) 60 MG capsule Take 60 mg by mouth daily.     [name] esomeprazole (NEXIUM) 40 MG capsule TAKE 1 CAPSULE BY MOUTH ONCE DAILY 90 capsule 3  . etodolac (LODINE) 500 MG tablet Take 1 tablet (500 mg total) by mouth 2 (two) times daily as needed (moderate pain). 60 tablet 2  . ipratropium (ATROVENT) 0.06 % nasal spray Place 2 sprays into both nostrils 3 (three) times daily as needed for rhinitis. 15 mL 3  . Multiple Vitamin (MULTIVITAMIN) capsule Take 1 capsule by mouth daily.    . valsartan-hydrochlorothiazide (DIOVAN-HCT) 160-12.5 MG tablet Take 1 tablet by mouth every morning. 90 tablet 3   No current facility-administered medications on file prior to visit.     PAST MEDICAL HISTORY: Past Medical History:  Diagnosis Date  . Chest pain   . Family history of adverse reaction to anesthesia    MOM-N/V, DAD-HAS PROBLEMS WITH NOVICAINE  . Food allergy   . GERD (gastroesophageal reflux disease)   . History of  hiatal hernia   . History of kidney stones    H/O  . Insomnia   . Joint pain   . Plantar fasciitis   . Seasonal allergies   . Tendonitis of foot    left    PAST SURGICAL HISTORY: Past Surgical History:  Procedure Laterality Date  . CARPAL TUNNEL RELEASE Right 07/03/2017   Procedure: CARPAL TUNNEL RELEASE;  Surgeon: Hessie Knows, MD;  Location: [facility];  Service: Orthopedics;  Laterality: Right;  . clavide surgery  2005  . CYSTOSCOPY/URETEROSCOPY/HOLMIUM LASER/STENT PLACEMENT Right 05/22/2018   Procedure: CYSTOSCOPY/URETEROSCOPY/HOLMIUM LASER/STENT PLACEMENT;  Surgeon: Billey Co, MD;  Location: [facility] OR[facility];  Service: Urology;  Laterality: Right;  . KNEE SURGERY Right 2013  . SHOULDER SURGERY Right 2008    SOCIAL HISTORY: Social History   Tobacco Use  . Smoking status: Never Smoker  . Smokeless tobacco: Never Used  Substance Use Topics  . Alcohol use: Yes    Alcohol/week: 0.0 standard drinks    Comment: RARE  . Drug use: No    FAMILY HISTORY: Family History  Problem Relation Age of Onset  . Cancer Mother        colon/rectal  . Hyperlipidemia Mother   . Cancer Brother  hodgekins  . Cancer Maternal Grandfather        lung  . Stroke Maternal Grandfather   . Diabetes Father   . Hypertension Father   . Hyperlipidemia Father   . Heart disease Father   . Alcoholism Father    ROS: Review of Systems  Gastrointestinal: Negative for nausea and vomiting.  Musculoskeletal:       Negative for muscle weakness.  Endo/Heme/Allergies:       Negative for polyphagia. Negative for hypoglycemia.   PHYSICAL EXAM: Pt in no acute distress  RECENT LABS AND TESTS: BMET    Component Value Date/Time   NA 142 06/22/2018 0816   NA 138 01/28/2018 1007   NA 138 05/20/2013 0815   K 4.3 06/22/2018 0816   K 3.7 05/20/2013 0815   CL 105 06/22/2018 0816   CL 106 05/20/2013 0815   CO2 28 06/22/2018 0816   CO2 26 05/20/2013 0815   GLUCOSE 112 (H) 06/22/2018 0816    GLUCOSE 101 (H) 05/20/2013 0815   BUN 18 06/22/2018 0816   BUN 18 01/28/2018 1007   BUN 20 (H) 05/20/2013 0815   CREATININE 0.99 06/22/2018 0816   CALCIUM 9.4 06/22/2018 0816   CALCIUM 8.9 05/20/2013 0815   GFRNONAA 92 06/22/2018 0816   GFRAA 107 06/22/2018 0816   Lab Results  Component Value Date   HGBA1C 6.3 (H) 06/22/2018   HGBA1C 6.2 (H) 01/28/2018   HGBA1C 6.7 11/07/2017   HGBA1C 6.3 05/08/2017   HGBA1C 6.5 11/06/2016   Lab Results  Component Value Date   INSULIN 11.6 05/13/2018   INSULIN 10.5 01/28/2018   CBC    Component Value Date/Time   WBC 6.2 06/22/2018 0816   RBC 4.98 06/22/2018 0816   HGB 13.9 06/22/2018 0816   HGB 14.6 01/28/2018 1007   HCT 41.1 06/22/2018 0816   HCT 43.5 01/28/2018 1007   PLT 249 06/22/2018 0816   PLT 250 06/23/2017 1104   MCV 82.5 06/22/2018 0816   MCV 84 01/28/2018 1007   MCV 84 11/05/2011 1606   MCH 27.9 06/22/2018 0816   MCHC 33.8 06/22/2018 0816   RDW 13.2 06/22/2018 0816   RDW 14.3 01/28/2018 1007   RDW 13.0 11/05/2011 1606   LYMPHSABS 1,358 06/22/2018 0816   LYMPHSABS 1.5 01/28/2018 1007   LYMPHSABS 1.8 11/05/2011 1606   MONOABS 0.9 04/12/2018 1820   MONOABS 0.6 11/05/2011 1606   EOSABS 360 06/22/2018 0816   EOSABS 0.2 01/28/2018 1007   EOSABS 0.1 11/05/2011 1606   BASOSABS 50 06/22/2018 0816   BASOSABS 0.0 01/28/2018 1007   BASOSABS 0.0 11/05/2011 1606   Iron/TIBC/Ferritin/ %Sat No results found for: IRON, TIBC, FERRITIN, IRONPCTSAT Lipid Panel     Component Value Date/Time   CHOL 182 06/22/2018 0816   CHOL 175 05/13/2018 0845   TRIG 82 06/22/2018 0816   HDL 65 06/22/2018 0816   HDL 51 05/13/2018 0845   CHOLHDL 2.8 06/22/2018 0816   LDLCALC 100 (H) 06/22/2018 0816   Hepatic Function Panel     Component Value Date/Time   PROT 6.6 06/22/2018 0816   PROT 7.0 01/28/2018 1007   PROT 8.5 (H) 11/05/2011 1606   ALBUMIN 4.6 04/12/2018 1820   ALBUMIN 4.4 01/28/2018 1007   ALBUMIN 4.5 11/05/2011 1606   AST 17  06/22/2018 0816   AST 36 11/05/2011 1606   ALT 18 06/22/2018 0816   ALT 53 11/05/2011 1606   ALKPHOS 63 04/12/2018 1820   ALKPHOS 147 (H) 11/05/2011 1606  BILITOT 0.3 06/22/2018 0816   BILITOT 0.3 01/28/2018 1007   BILITOT 0.5 11/05/2011 1606      Component Value Date/Time   TSH 1.330 01/28/2018 1007   TSH 1.440 01/04/2016 0818   Results for Colee, Damany C "NED" (MRN [id number]) as of 12/29/2018 12:12  Ref. Range 05/13/2018 08:45  Vitamin D, 25-Hydroxy Latest Ref Range: 30.0 - 100.0 ng/mL 37.8   I, Michaelene Song, am acting as Location manager for [employer], PA-C I, Abby Potash, PA-C have reviewed above note and agree with its content

## 2019-01-06 MED FILL — DULOXETINE HCL 60 MG CPEP: 60 | 30 days supply | Qty: 30 | Fill #0

## 2019-01-06 MED FILL — ESOMEPRAZOLE MAG DR 40 MG C: 40 | 90 days supply | Qty: 90 | Fill #0 | Status: TO

## 2019-01-06 MED FILL — ETODOLAC 500 MG TABS: 500 | 30 days supply | Qty: 60 | Fill #0

## 2019-01-13 ENCOUNTER — Other Ambulatory Visit: Payer: Self-pay

## 2019-01-13 ENCOUNTER — Ambulatory Visit (INDEPENDENT_AMBULATORY_CARE_PROVIDER_SITE_OTHER): Payer: 59 | Admitting: Physician Assistant

## 2019-01-13 ENCOUNTER — Encounter (INDEPENDENT_AMBULATORY_CARE_PROVIDER_SITE_OTHER): Payer: Self-pay | Admitting: Physician Assistant

## 2019-01-13 DIAGNOSIS — E669 Obesity, unspecified: Secondary | ICD-10-CM | POA: Diagnosis not present

## 2019-01-13 DIAGNOSIS — E119 Type 2 diabetes mellitus without complications: Secondary | ICD-10-CM | POA: Diagnosis not present

## 2019-01-13 DIAGNOSIS — Z6833 Body mass index (BMI) 33.0-33.9, adult: Secondary | ICD-10-CM

## 2019-01-13 NOTE — Progress Notes (Signed)
Office: [phone]  /  Fax: [phone] TeleHealth Visit:  [name] has verbally consented to this TeleHealth visit today. The patient is located at work, the provider is located at the [employer] [facility] office. The participants in this visit include the listed provider and patient. The visit was conducted today via FaceTime.  HPI:   Chief Complaint: OBESITY [name] is here to discuss his progress with his obesity treatment plan. He is on the  Category 4 plan and is following his eating plan approximately 80% of the time. He states he is walking 45 minutes 3-4 times per week. [name] reports that his weight yesterday was 246 lbs. He states he is not always meeting his protein goal and is sometimes snacking on Nutty Buddies due to increased stress at work. We were unable to weigh the patient today for this TeleHealth visit. He feels as if he has lost 3 lbs since his last visit. He has lost 0 lbs since starting treatment with [name].  Diabetes II [name] has a diagnosis of diabetes type II and is on metformin. [name] states fasting blood sugars range between 108 and 118. Last A1c was 6.3 on 06/22/2018. He has been working on intensive lifestyle modifications including diet, exercise, and weight loss to help control his blood glucose levels.  No nausea, vomiting, diarrhea, or polyphagia.  ASSESSMENT AND PLAN:  Type 2 diabetes mellitus without complication, without long-term current use of insulin (HCC)  Class 1 obesity with serious comorbidity and body mass index (BMI) of 33.0 to 33.9 in adult, unspecified obesity type  PLAN:  Diabetes II [name] has been given extensive diabetes education by myself today including ideal fasting and post-prandial blood glucose readings, individual ideal HgA1c goals  and hypoglycemia prevention. We discussed the importance of good blood sugar control to decrease the likelihood of diabetic complications such as nephropathy, neuropathy, limb loss,  blindness, coronary artery disease, and death. We discussed the importance of intensive lifestyle modification including diet, exercise and weight loss as the first line treatment for diabetes. Dempsy agrees to continue his diabetes medications and will follow-up at the agreed upon time.  Obesity [name] is currently in the action stage of change. As such, his goal is to continue with weight loss efforts. He has agreed to follow the Category 4 plan. [name] has been instructed to work up to a goal of 150 minutes of combined cardio and strengthening exercise per week for weight loss and overall health benefits. We discussed the following Behavioral Modification Strategies today: increasing lean protein intake, work on meal planning and easy cooking plans.  Nil has agreed to follow-up with our clinic in 2 weeks. He was informed of the importance of frequent follow-up visits to maximize his success with intensive lifestyle modifications for his multiple health conditions.  ALLERGIES: Allergies  Allergen Reactions  . Tomato Other (See Comments)    GI distress    MEDICATIONS: Current Outpatient Medications on File Prior to Visit  Medication Sig Dispense Refill  . buPROPion (WELLBUTRIN XL) 150 MG 24 hr tablet Take 150 mg by mouth daily.   12  . DULoxetine (CYMBALTA) 60 MG capsule Take 60 mg by mouth daily.     [name] esomeprazole (NEXIUM) 40 MG capsule TAKE 1 CAPSULE BY MOUTH ONCE DAILY 90 capsule 3  . etodolac (LODINE) 500 MG tablet Take 1 tablet (500 mg total) by mouth 2 (two) times daily as needed (moderate pain). 60 tablet 2  . ipratropium (ATROVENT) 0.06 % nasal  spray Place 2 sprays into both nostrils 3 (three) times daily as needed for rhinitis. 15 mL 3  . metFORMIN (GLUCOPHAGE) 1000 MG tablet Take 1 tablet (1,000 mg total) by mouth 2 (two) times daily with a meal. 60 tablet 0  . Multiple Vitamin (MULTIVITAMIN) capsule Take 1 capsule by mouth daily.    . valsartan-hydrochlorothiazide  (DIOVAN-HCT) 160-12.5 MG tablet Take 1 tablet by mouth every morning. 90 tablet 3  . Vitamin D, Ergocalciferol, (DRISDOL) 1.25 MG (50000 UT) CAPS capsule Take 1 capsule (50,000 Units total) by mouth every 7 (seven) days. 4 capsule 0   No current facility-administered medications on file prior to visit.     PAST MEDICAL HISTORY: Past Medical History:  Diagnosis Date  . Chest pain   . Family history of adverse reaction to anesthesia    MOM-N/V, DAD-HAS PROBLEMS WITH NOVICAINE  . Food allergy   . GERD (gastroesophageal reflux disease)   . History of hiatal hernia   . History of kidney stones    H/O  . Insomnia   . Joint pain   . Plantar fasciitis   . Seasonal allergies   . Tendonitis of foot    left    PAST SURGICAL HISTORY: Past Surgical History:  Procedure Laterality Date  . CARPAL TUNNEL RELEASE Right 07/03/2017   Procedure: CARPAL TUNNEL RELEASE;  Surgeon: Hessie Knows, MD;  Location: [facility];  Service: Orthopedics;  Laterality: Right;  . clavide surgery  2005  . CYSTOSCOPY/URETEROSCOPY/HOLMIUM LASER/STENT PLACEMENT Right 05/22/2018   Procedure: CYSTOSCOPY/URETEROSCOPY/HOLMIUM LASER/STENT PLACEMENT;  Surgeon: Billey Co, MD;  Location: [facility] OR[facility];  Service: Urology;  Laterality: Right;  . KNEE SURGERY Right 2013  . SHOULDER SURGERY Right 2008    SOCIAL HISTORY: Social History   Tobacco Use  . Smoking status: Never Smoker  . Smokeless tobacco: Never Used  Substance Use Topics  . Alcohol use: Yes    Alcohol/week: 0.0 standard drinks    Comment: RARE  . Drug use: No    FAMILY HISTORY: Family History  Problem Relation Age of Onset  . Cancer Mother        colon/rectal  . Hyperlipidemia Mother   . Cancer Brother        hodgekins  . Cancer Maternal Grandfather        lung  . Stroke Maternal Grandfather   . Diabetes Father   . Hypertension Father   . Hyperlipidemia Father   . Heart disease Father   . Alcoholism Father    ROS: Review of Systems   Gastrointestinal: Negative for diarrhea, nausea and vomiting.  Endo/Heme/Allergies:       Negative for polyphagia.   PHYSICAL EXAM: Pt in no acute distress  RECENT LABS AND TESTS: BMET    Component Value Date/Time   NA 142 06/22/2018 0816   NA 138 01/28/2018 1007   NA 138 05/20/2013 0815   K 4.3 06/22/2018 0816   K 3.7 05/20/2013 0815   CL 105 06/22/2018 0816   CL 106 05/20/2013 0815   CO2 28 06/22/2018 0816   CO2 26 05/20/2013 0815   GLUCOSE 112 (H) 06/22/2018 0816   GLUCOSE 101 (H) 05/20/2013 0815   BUN 18 06/22/2018 0816   BUN 18 01/28/2018 1007   BUN 20 (H) 05/20/2013 0815   CREATININE 0.99 06/22/2018 0816   CALCIUM 9.4 06/22/2018 0816   CALCIUM 8.9 05/20/2013 0815   GFRNONAA 92 06/22/2018 0816   GFRAA 107 06/22/2018 0816   Lab Results  Component Value  Date   HGBA1C 6.3 (H) 06/22/2018   HGBA1C 6.2 (H) 01/28/2018   HGBA1C 6.7 11/07/2017   HGBA1C 6.3 05/08/2017   HGBA1C 6.5 11/06/2016   Lab Results  Component Value Date   INSULIN 11.6 05/13/2018   INSULIN 10.5 01/28/2018   CBC    Component Value Date/Time   WBC 6.2 06/22/2018 0816   RBC 4.98 06/22/2018 0816   HGB 13.9 06/22/2018 0816   HGB 14.6 01/28/2018 1007   HCT 41.1 06/22/2018 0816   HCT 43.5 01/28/2018 1007   PLT 249 06/22/2018 0816   PLT 250 06/23/2017 1104   MCV 82.5 06/22/2018 0816   MCV 84 01/28/2018 1007   MCV 84 11/05/2011 1606   MCH 27.9 06/22/2018 0816   MCHC 33.8 06/22/2018 0816   RDW 13.2 06/22/2018 0816   RDW 14.3 01/28/2018 1007   RDW 13.0 11/05/2011 1606   LYMPHSABS 1,358 06/22/2018 0816   LYMPHSABS 1.5 01/28/2018 1007   LYMPHSABS 1.8 11/05/2011 1606   MONOABS 0.9 04/12/2018 1820   MONOABS 0.6 11/05/2011 1606   EOSABS 360 06/22/2018 0816   EOSABS 0.2 01/28/2018 1007   EOSABS 0.1 11/05/2011 1606   BASOSABS 50 06/22/2018 0816   BASOSABS 0.0 01/28/2018 1007   BASOSABS 0.0 11/05/2011 1606   Iron/TIBC/Ferritin/ %Sat No results found for: IRON, TIBC, FERRITIN, IRONPCTSAT  Lipid Panel     Component Value Date/Time   CHOL 182 06/22/2018 0816   CHOL 175 05/13/2018 0845   TRIG 82 06/22/2018 0816   HDL 65 06/22/2018 0816   HDL 51 05/13/2018 0845   CHOLHDL 2.8 06/22/2018 0816   LDLCALC 100 (H) 06/22/2018 0816   Hepatic Function Panel     Component Value Date/Time   PROT 6.6 06/22/2018 0816   PROT 7.0 01/28/2018 1007   PROT 8.5 (H) 11/05/2011 1606   ALBUMIN 4.6 04/12/2018 1820   ALBUMIN 4.4 01/28/2018 1007   ALBUMIN 4.5 11/05/2011 1606   AST 17 06/22/2018 0816   AST 36 11/05/2011 1606   ALT 18 06/22/2018 0816   ALT 53 11/05/2011 1606   ALKPHOS 63 04/12/2018 1820   ALKPHOS 147 (H) 11/05/2011 1606   BILITOT 0.3 06/22/2018 0816   BILITOT 0.3 01/28/2018 1007   BILITOT 0.5 11/05/2011 1606      Component Value Date/Time   TSH 1.330 01/28/2018 1007   TSH 1.440 01/04/2016 0818   Results for Rought, Bronsyn C "NED" (MRN [id number]) as of 01/13/2019 16:05  Ref. Range 05/13/2018 08:45  Vitamin D, 25-Hydroxy Latest Ref Range: 30.0 - 100.0 ng/mL 37.8   I, Michaelene Song, am acting as Location manager for [employer], PA-C I, Abby Potash, PA-C have reviewed above note and agree with its content

## 2019-01-21 ENCOUNTER — Encounter (INDEPENDENT_AMBULATORY_CARE_PROVIDER_SITE_OTHER): Payer: Self-pay | Admitting: Physician Assistant

## 2019-01-25 NOTE — Telephone Encounter (Signed)
Please address

## 2019-01-27 DIAGNOSIS — H10413 Chronic giant papillary conjunctivitis, bilateral: Secondary | ICD-10-CM | POA: Diagnosis not present

## 2019-01-27 DIAGNOSIS — E119 Type 2 diabetes mellitus without complications: Secondary | ICD-10-CM | POA: Diagnosis not present

## 2019-01-27 DIAGNOSIS — H04123 Dry eye syndrome of bilateral lacrimal glands: Secondary | ICD-10-CM | POA: Diagnosis not present

## 2019-01-27 LAB — HM DIABETES FOOT EXAM: HM Diabetic Foot Exam: NORMAL

## 2019-01-27 LAB — HM DIABETES EYE EXAM

## 2019-01-28 ENCOUNTER — Other Ambulatory Visit: Payer: Self-pay

## 2019-01-28 ENCOUNTER — Ambulatory Visit (INDEPENDENT_AMBULATORY_CARE_PROVIDER_SITE_OTHER): Payer: 59 | Admitting: Physician Assistant

## 2019-01-28 DIAGNOSIS — E119 Type 2 diabetes mellitus without complications: Secondary | ICD-10-CM

## 2019-01-28 DIAGNOSIS — E669 Obesity, unspecified: Secondary | ICD-10-CM | POA: Diagnosis not present

## 2019-01-28 DIAGNOSIS — Z6833 Body mass index (BMI) 33.0-33.9, adult: Secondary | ICD-10-CM

## 2019-01-28 DIAGNOSIS — E559 Vitamin D deficiency, unspecified: Secondary | ICD-10-CM

## 2019-01-28 MED ORDER — METFORMIN HCL 1000 MG PO TABS: 1000.0000 mg | ORAL_TABLET | Freq: Two times a day (BID) | ORAL | 0 refills | Status: DC

## 2019-01-28 MED ORDER — VITAMIN D (ERGOCALCIFEROL) 1.25 MG (50000 UNIT) PO CAPS: 50000.0000 [IU] | ORAL_CAPSULE | ORAL | 0 refills | Status: DC

## 2019-01-30 ENCOUNTER — Other Ambulatory Visit: Payer: Self-pay | Admitting: Family Medicine

## 2019-01-30 DIAGNOSIS — M1990 Unspecified osteoarthritis, unspecified site: Secondary | ICD-10-CM

## 2019-01-30 MED FILL — DULOXETINE HCL 60 MG CPEP: 60 | 30 days supply | Qty: 30 | Fill #1 | Status: TO

## 2019-02-01 NOTE — Progress Notes (Signed)
Office: [phone]  /  Fax: [phone] TeleHealth Visit:  [name] has verbally consented to this TeleHealth visit today. The patient is located at work, the provider is located at the [employer] [facility] office. The participants in this visit include the listed provider and patient. The visit was conducted today via FaceTime.  HPI:   Chief Complaint: OBESITY [name] is here to discuss his progress with his obesity treatment plan. He is on the Category 4 plan and is following his eating plan approximately 90-95% of the time. He states he is walking 20-40 minutes 2-4 times per week. [name] reports that his weight today is 248 lbs. He reports that he sometimes undereats his calories and also reports not getting enough protein. We were unable to weigh the patient today for this TeleHealth visit. He states his weight today is 248 lbs. He has lost 0 lbs since starting treatment with [name].  Vitamin D deficiency [name] has a diagnosis of Vitamin D deficiency. He is currently taking prescription Vit D and denies nausea, vomiting or muscle weakness.  Diabetes II [name] has a diagnosis of diabetes type II and is on metformin. [name] states fasting blood sugars range between 95 and 110. Last A1c was 6.3 on 06/22/2018. He has been working on intensive lifestyle modifications including diet, exercise, and weight loss to help control his blood glucose levels. No nausea, vomiting, or diarrhea.  ASSESSMENT AND PLAN:  Vitamin D deficiency - Plan: Vitamin D, Ergocalciferol, (DRISDOL) 1.25 MG (50000 UT) CAPS capsule  Type 2 diabetes mellitus without complication, without long-term current use of insulin (HCC) - Plan: metFORMIN (GLUCOPHAGE) 1000 MG tablet  Class 1 obesity with serious comorbidity and body mass index (BMI) of 33.0 to 33.9 in adult, unspecified obesity type  PLAN:  Vitamin D Deficiency [name] was informed that low Vitamin D levels contributes to fatigue and are associated  with obesity, breast, and colon cancer. He agrees to continue to take prescription Vit D @ 50,000 IU every week #4 with 0 refills and will follow-up for routine testing of Vitamin D, at least 2-3 times per year. He was informed of the risk of over-replacement of Vitamin D and agrees to not increase his dose unless he discusses this with [name] first. [name] agrees to follow-up with our clinic in 2 weeks.  Diabetes II [name] has been given extensive diabetes education by myself today including ideal fasting and post-prandial blood glucose readings, individual ideal HgA1c goals  and hypoglycemia prevention. We discussed the importance of good blood sugar control to decrease the likelihood of diabetic complications such as nephropathy, neuropathy, limb loss, blindness, coronary artery disease, and death. We discussed the importance of intensive lifestyle modification including diet, exercise and weight loss as the first line treatment for diabetes. [name] was given a refill on his metformin #60 with 0 refills and agrees to follow-up with our clinic in 2 weeks.   Obesity [name] is currently in the action stage of change. As such, his goal is to continue with weight loss efforts. He has agreed to follow the Category 4 plan. [name] has been instructed to work up to a goal of 150 minutes of combined cardio and strengthening exercise per week for weight loss and overall health benefits. We discussed the following Behavioral Modification Strategies today: increase H20 intake, work on meal planning, easy cooking plans, and keeping healthy foods in the home.  [name] has agreed to follow-up with our clinic in 2 weeks. He was informed of  the importance of frequent follow-up visits to maximize his success with intensive lifestyle modifications for his multiple health conditions.  ALLERGIES: Allergies  Allergen Reactions  . Tomato Other (See Comments)    GI distress    MEDICATIONS: Current Outpatient Medications  on File Prior to Visit  Medication Sig Dispense Refill  . buPROPion (WELLBUTRIN XL) 150 MG 24 hr tablet Take 150 mg by mouth daily.   12  . DULoxetine (CYMBALTA) 60 MG capsule Take 60 mg by mouth daily.     [name] esomeprazole (NEXIUM) 40 MG capsule TAKE 1 CAPSULE BY MOUTH ONCE DAILY 90 capsule 3  . etodolac (LODINE) 500 MG tablet Take 1 tablet (500 mg total) by mouth 2 (two) times daily as needed (moderate pain). 60 tablet 2  . ipratropium (ATROVENT) 0.06 % nasal spray Place 2 sprays into both nostrils 3 (three) times daily as needed for rhinitis. 15 mL 3  . Multiple Vitamin (MULTIVITAMIN) capsule Take 1 capsule by mouth daily.    . valsartan-hydrochlorothiazide (DIOVAN-HCT) 160-12.5 MG tablet Take 1 tablet by mouth every morning. 90 tablet 3   No current facility-administered medications on file prior to visit.     PAST MEDICAL HISTORY: Past Medical History:  Diagnosis Date  . Chest pain   . Family history of adverse reaction to anesthesia    MOM-N/V, DAD-HAS PROBLEMS WITH NOVICAINE  . Food allergy   . GERD (gastroesophageal reflux disease)   . History of hiatal hernia   . History of kidney stones    H/O  . Insomnia   . Joint pain   . Plantar fasciitis   . Seasonal allergies   . Tendonitis of foot    left    PAST SURGICAL HISTORY: Past Surgical History:  Procedure Laterality Date  . CARPAL TUNNEL RELEASE Right 07/03/2017   Procedure: CARPAL TUNNEL RELEASE;  Surgeon: Hessie Knows, MD;  Location: [facility];  Service: Orthopedics;  Laterality: Right;  . clavide surgery  2005  . CYSTOSCOPY/URETEROSCOPY/HOLMIUM LASER/STENT PLACEMENT Right 05/22/2018   Procedure: CYSTOSCOPY/URETEROSCOPY/HOLMIUM LASER/STENT PLACEMENT;  Surgeon: Billey Co, MD;  Location: [facility] OR[facility];  Service: Urology;  Laterality: Right;  . KNEE SURGERY Right 2013  . SHOULDER SURGERY Right 2008    SOCIAL HISTORY: Social History   Tobacco Use  . Smoking status: Never Smoker  . Smokeless tobacco: Never Used   Substance Use Topics  . Alcohol use: Yes    Alcohol/week: 0.0 standard drinks    Comment: RARE  . Drug use: No    FAMILY HISTORY: Family History  Problem Relation Age of Onset  . Cancer Mother        colon/rectal  . Hyperlipidemia Mother   . Cancer Brother        hodgekins  . Cancer Maternal Grandfather        lung  . Stroke Maternal Grandfather   . Diabetes Father   . Hypertension Father   . Hyperlipidemia Father   . Heart disease Father   . Alcoholism Father    ROS: Review of Systems  Gastrointestinal: Negative for diarrhea, nausea and vomiting.  Musculoskeletal:       Negative for muscle weakness.   PHYSICAL EXAM: Pt in no acute distress  RECENT LABS AND TESTS: BMET    Component Value Date/Time   NA 142 06/22/2018 0816   NA 138 01/28/2018 1007   NA 138 05/20/2013 0815   K 4.3 06/22/2018 0816   K 3.7 05/20/2013 0815   CL 105 06/22/2018 0816  CL 106 05/20/2013 0815   CO2 28 06/22/2018 0816   CO2 26 05/20/2013 0815   GLUCOSE 112 (H) 06/22/2018 0816   GLUCOSE 101 (H) 05/20/2013 0815   BUN 18 06/22/2018 0816   BUN 18 01/28/2018 1007   BUN 20 (H) 05/20/2013 0815   CREATININE 0.99 06/22/2018 0816   CALCIUM 9.4 06/22/2018 0816   CALCIUM 8.9 05/20/2013 0815   GFRNONAA 92 06/22/2018 0816   GFRAA 107 06/22/2018 0816   Lab Results  Component Value Date   HGBA1C 6.3 (H) 06/22/2018   HGBA1C 6.2 (H) 01/28/2018   HGBA1C 6.7 11/07/2017   HGBA1C 6.3 05/08/2017   HGBA1C 6.5 11/06/2016   Lab Results  Component Value Date   INSULIN 11.6 05/13/2018   INSULIN 10.5 01/28/2018   CBC    Component Value Date/Time   WBC 6.2 06/22/2018 0816   RBC 4.98 06/22/2018 0816   HGB 13.9 06/22/2018 0816   HGB 14.6 01/28/2018 1007   HCT 41.1 06/22/2018 0816   HCT 43.5 01/28/2018 1007   PLT 249 06/22/2018 0816   PLT 250 06/23/2017 1104   MCV 82.5 06/22/2018 0816   MCV 84 01/28/2018 1007   MCV 84 11/05/2011 1606   MCH 27.9 06/22/2018 0816   MCHC 33.8 06/22/2018 0816    RDW 13.2 06/22/2018 0816   RDW 14.3 01/28/2018 1007   RDW 13.0 11/05/2011 1606   LYMPHSABS 1,358 06/22/2018 0816   LYMPHSABS 1.5 01/28/2018 1007   LYMPHSABS 1.8 11/05/2011 1606   MONOABS 0.9 04/12/2018 1820   MONOABS 0.6 11/05/2011 1606   EOSABS 360 06/22/2018 0816   EOSABS 0.2 01/28/2018 1007   EOSABS 0.1 11/05/2011 1606   BASOSABS 50 06/22/2018 0816   BASOSABS 0.0 01/28/2018 1007   BASOSABS 0.0 11/05/2011 1606   Iron/TIBC/Ferritin/ %Sat No results found for: IRON, TIBC, FERRITIN, IRONPCTSAT Lipid Panel     Component Value Date/Time   CHOL 182 06/22/2018 0816   CHOL 175 05/13/2018 0845   TRIG 82 06/22/2018 0816   HDL 65 06/22/2018 0816   HDL 51 05/13/2018 0845   CHOLHDL 2.8 06/22/2018 0816   LDLCALC 100 (H) 06/22/2018 0816   Hepatic Function Panel     Component Value Date/Time   PROT 6.6 06/22/2018 0816   PROT 7.0 01/28/2018 1007   PROT 8.5 (H) 11/05/2011 1606   ALBUMIN 4.6 04/12/2018 1820   ALBUMIN 4.4 01/28/2018 1007   ALBUMIN 4.5 11/05/2011 1606   AST 17 06/22/2018 0816   AST 36 11/05/2011 1606   ALT 18 06/22/2018 0816   ALT 53 11/05/2011 1606   ALKPHOS 63 04/12/2018 1820   ALKPHOS 147 (H) 11/05/2011 1606   BILITOT 0.3 06/22/2018 0816   BILITOT 0.3 01/28/2018 1007   BILITOT 0.5 11/05/2011 1606      Component Value Date/Time   TSH 1.330 01/28/2018 1007   TSH 1.440 01/04/2016 0818   Results for Feely, Joseh C "NED" (MRN [id number]) as of 02/01/2019 09:02  Ref. Range 05/13/2018 08:45  Vitamin D, 25-Hydroxy Latest Ref Range: 30.0 - 100.0 ng/mL 37.8    I, Michaelene Song, am acting as Location manager for [employer], PA-C I, Abby Potash, PA-C have reviewed above note and agree with its content

## 2019-02-09 DIAGNOSIS — K644 Residual hemorrhoidal skin tags: Secondary | ICD-10-CM

## 2019-02-15 ENCOUNTER — Ambulatory Visit (INDEPENDENT_AMBULATORY_CARE_PROVIDER_SITE_OTHER): Payer: 59 | Admitting: Physician Assistant

## 2019-02-15 ENCOUNTER — Other Ambulatory Visit: Payer: Self-pay

## 2019-02-15 ENCOUNTER — Encounter (INDEPENDENT_AMBULATORY_CARE_PROVIDER_SITE_OTHER): Payer: Self-pay | Admitting: Physician Assistant

## 2019-02-15 DIAGNOSIS — E119 Type 2 diabetes mellitus without complications: Secondary | ICD-10-CM | POA: Diagnosis not present

## 2019-02-15 DIAGNOSIS — Z6833 Body mass index (BMI) 33.0-33.9, adult: Secondary | ICD-10-CM

## 2019-02-15 DIAGNOSIS — E669 Obesity, unspecified: Secondary | ICD-10-CM

## 2019-02-15 NOTE — Progress Notes (Signed)
Office: [phone]  /  Fax: [phone] TeleHealth Visit:  [name] has verbally consented to this TeleHealth visit today. The patient is located at home, the provider is located at the [employer] [facility] office. The participants in this visit include the listed provider and patient. [name] was unable to use realtime audiovisual technology today and the telehealth visit was conducted via telephone.   HPI:   Chief Complaint: OBESITY Stone is here to discuss his progress with his obesity treatment plan. He is on the Category 4 plan and is following his eating plan approximately 85-90 % of the time. He states he is walking and biking for 30 minutes 3-4 times per week. [name] reports he has not been able to eat all of the food on the plan due to recent constipation. He is taking Colace and is getting enough fiber in his diet. He is also drinking a gallon of water a day. He reports his weight today is 247 lbs. We were unable to weigh the patient today for this TeleHealth visit. He feels as if he has lost 1 lb since his last visit. He has lost 0 lbs since starting treatment with [name].  Diabetes II [name] has a diagnosis of diabetes type II. [place] states fasting BGs range between 100 and 110. He is on metformin and denies nausea, vomiting, or diarrhea. Last A1c was 6.3. He denies hypoglycemia. He has been working on intensive lifestyle modifications including diet, exercise, and weight loss to help control his blood glucose levels.  ASSESSMENT AND PLAN:  Type 2 diabetes mellitus without complication, without long-term current use of insulin (HCC)  Class 1 obesity with serious comorbidity and body mass index (BMI) of 33.0 to 33.9 in adult, unspecified obesity type  PLAN:  Diabetes II [name] has been given extensive diabetes education by myself today including ideal fasting and post-prandial blood glucose readings, individual ideal Hgb A1c goals and hypoglycemia prevention. We  discussed the importance of good blood sugar control to decrease the likelihood of diabetic complications such as nephropathy, neuropathy, limb loss, blindness, coronary artery disease, and death. We discussed the importance of intensive lifestyle modification including diet, exercise and weight loss as the first line treatment for diabetes. [name] agrees to continue taking metformin and he will continue with weight loss. [name] agrees to follow up with our clinic in 2 weeks.  Obesity [name] is currently in the action stage of change. As such, his goal is to continue with weight loss efforts He has agreed to follow the Category 4 plan [name] has been instructed to work up to a goal of 150 minutes of combined cardio and strengthening exercise per week for weight loss and overall health benefits. We discussed the following Behavioral Modification Strategies today: increasing fiber rich foods and no skipping meals   [name] has agreed to follow up with our clinic in 2 weeks. He was informed of the importance of frequent follow up visits to maximize his success with intensive lifestyle modifications for his multiple health conditions.  ALLERGIES: Allergies  Allergen Reactions  . Tomato Other (See Comments)    GI distress    MEDICATIONS: Current Outpatient Medications on File Prior to Visit  Medication Sig Dispense Refill  . buPROPion (WELLBUTRIN XL) 150 MG 24 hr tablet Take 150 mg by mouth daily.   12  . DULoxetine (CYMBALTA) 60 MG capsule Take 60 mg by mouth daily.     [name] esomeprazole (NEXIUM) 40 MG capsule TAKE 1 CAPSULE BY  MOUTH ONCE DAILY 90 capsule 3  . etodolac (LODINE) 500 MG tablet TAKE 1 TABLET BY MOUTH TWICE DAILY AS NEEDED (MODERATE PAIN). 60 tablet 2  . ipratropium (ATROVENT) 0.06 % nasal spray Place 2 sprays into both nostrils 3 (three) times daily as needed for rhinitis. 15 mL 3  . metFORMIN (GLUCOPHAGE) 1000 MG tablet Take 1 tablet (1,000 mg total) by mouth 2 (two) times daily  with a meal. 60 tablet 0  . Multiple Vitamin (MULTIVITAMIN) capsule Take 1 capsule by mouth daily.    . valsartan-hydrochlorothiazide (DIOVAN-HCT) 160-12.5 MG tablet Take 1 tablet by mouth every morning. 90 tablet 3  . Vitamin D, Ergocalciferol, (DRISDOL) 1.25 MG (50000 UT) CAPS capsule Take 1 capsule (50,000 Units total) by mouth every 7 (seven) days. 4 capsule 0   No current facility-administered medications on file prior to visit.     PAST MEDICAL HISTORY: Past Medical History:  Diagnosis Date  . Chest pain   . Family history of adverse reaction to anesthesia    MOM-N/V, DAD-HAS PROBLEMS WITH NOVICAINE  . Food allergy   . GERD (gastroesophageal reflux disease)   . History of hiatal hernia   . History of kidney stones    H/O  . Insomnia   . Joint pain   . Plantar fasciitis   . Seasonal allergies   . Tendonitis of foot    left    PAST SURGICAL HISTORY: Past Surgical History:  Procedure Laterality Date  . CARPAL TUNNEL RELEASE Right 07/03/2017   Procedure: CARPAL TUNNEL RELEASE;  Surgeon: Hessie Knows, MD;  Location: [facility];  Service: Orthopedics;  Laterality: Right;  . clavide surgery  2005  . CYSTOSCOPY/URETEROSCOPY/HOLMIUM LASER/STENT PLACEMENT Right 05/22/2018   Procedure: CYSTOSCOPY/URETEROSCOPY/HOLMIUM LASER/STENT PLACEMENT;  Surgeon: Billey Co, MD;  Location: [facility] OR[facility];  Service: Urology;  Laterality: Right;  . KNEE SURGERY Right 2013  . SHOULDER SURGERY Right 2008    SOCIAL HISTORY: Social History   Tobacco Use  . Smoking status: Never Smoker  . Smokeless tobacco: Never Used  Substance Use Topics  . Alcohol use: Yes    Alcohol/week: 0.0 standard drinks    Comment: RARE  . Drug use: No    FAMILY HISTORY: Family History  Problem Relation Age of Onset  . Cancer Mother        colon/rectal  . Hyperlipidemia Mother   . Cancer Brother        hodgekins  . Cancer Maternal Grandfather        lung  . Stroke Maternal Grandfather   . Diabetes Father    . Hypertension Father   . Hyperlipidemia Father   . Heart disease Father   . Alcoholism Father     ROS: Review of Systems  Constitutional: Positive for weight loss.  Gastrointestinal: Positive for constipation. Negative for diarrhea, nausea and vomiting.  Endo/Heme/Allergies:       Negative hypoglycemia    PHYSICAL EXAM: Pt in no acute distress  RECENT LABS AND TESTS: BMET    Component Value Date/Time   NA 142 06/22/2018 0816   NA 138 01/28/2018 1007   NA 138 05/20/2013 0815   K 4.3 06/22/2018 0816   K 3.7 05/20/2013 0815   CL 105 06/22/2018 0816   CL 106 05/20/2013 0815   CO2 28 06/22/2018 0816   CO2 26 05/20/2013 0815   GLUCOSE 112 (H) 06/22/2018 0816   GLUCOSE 101 (H) 05/20/2013 0815   BUN 18 06/22/2018 0816   BUN 18 01/28/2018 1007  BUN 20 (H) 05/20/2013 0815   CREATININE 0.99 06/22/2018 0816   CALCIUM 9.4 06/22/2018 0816   CALCIUM 8.9 05/20/2013 0815   GFRNONAA 92 06/22/2018 0816   GFRAA 107 06/22/2018 0816   Lab Results  Component Value Date   HGBA1C 6.3 (H) 06/22/2018   HGBA1C 6.2 (H) 01/28/2018   HGBA1C 6.7 11/07/2017   HGBA1C 6.3 05/08/2017   HGBA1C 6.5 11/06/2016   Lab Results  Component Value Date   INSULIN 11.6 05/13/2018   INSULIN 10.5 01/28/2018   CBC    Component Value Date/Time   WBC 6.2 06/22/2018 0816   RBC 4.98 06/22/2018 0816   HGB 13.9 06/22/2018 0816   HGB 14.6 01/28/2018 1007   HCT 41.1 06/22/2018 0816   HCT 43.5 01/28/2018 1007   PLT 249 06/22/2018 0816   PLT 250 06/23/2017 1104   MCV 82.5 06/22/2018 0816   MCV 84 01/28/2018 1007   MCV 84 11/05/2011 1606   MCH 27.9 06/22/2018 0816   MCHC 33.8 06/22/2018 0816   RDW 13.2 06/22/2018 0816   RDW 14.3 01/28/2018 1007   RDW 13.0 11/05/2011 1606   LYMPHSABS 1,358 06/22/2018 0816   LYMPHSABS 1.5 01/28/2018 1007   LYMPHSABS 1.8 11/05/2011 1606   MONOABS 0.9 04/12/2018 1820   MONOABS 0.6 11/05/2011 1606   EOSABS 360 06/22/2018 0816   EOSABS 0.2 01/28/2018 1007   EOSABS 0.1  11/05/2011 1606   BASOSABS 50 06/22/2018 0816   BASOSABS 0.0 01/28/2018 1007   BASOSABS 0.0 11/05/2011 1606   Iron/TIBC/Ferritin/ %Sat No results found for: IRON, TIBC, FERRITIN, IRONPCTSAT Lipid Panel     Component Value Date/Time   CHOL 182 06/22/2018 0816   CHOL 175 05/13/2018 0845   TRIG 82 06/22/2018 0816   HDL 65 06/22/2018 0816   HDL 51 05/13/2018 0845   CHOLHDL 2.8 06/22/2018 0816   LDLCALC 100 (H) 06/22/2018 0816   Hepatic Function Panel     Component Value Date/Time   PROT 6.6 06/22/2018 0816   PROT 7.0 01/28/2018 1007   PROT 8.5 (H) 11/05/2011 1606   ALBUMIN 4.6 04/12/2018 1820   ALBUMIN 4.4 01/28/2018 1007   ALBUMIN 4.5 11/05/2011 1606   AST 17 06/22/2018 0816   AST 36 11/05/2011 1606   ALT 18 06/22/2018 0816   ALT 53 11/05/2011 1606   ALKPHOS 63 04/12/2018 1820   ALKPHOS 147 (H) 11/05/2011 1606   BILITOT 0.3 06/22/2018 0816   BILITOT 0.3 01/28/2018 1007   BILITOT 0.5 11/05/2011 1606      Component Value Date/Time   TSH 1.330 01/28/2018 1007   TSH 1.440 01/04/2016 0818      I, Trixie Dredge, am acting as transcriptionist for Abby Potash, PA-C I, Abby Potash, PA-C have reviewed above note and agree with its content

## 2019-02-16 MED ORDER — HYDROCORTISONE ACETATE 25 MG RE SUPP: 25.0000 mg | Freq: Two times a day (BID) | RECTAL | 2 refills | Status: DC | PRN

## 2019-02-16 NOTE — Addendum Note (Signed)
Addended by: Olin Hauser on: 02/16/2019 05:48 PM   Modules accepted: Orders

## 2019-02-21 ENCOUNTER — Emergency Department: Payer: 59

## 2019-02-21 ENCOUNTER — Other Ambulatory Visit: Payer: Self-pay

## 2019-02-21 ENCOUNTER — Encounter: Payer: Self-pay | Admitting: *Deleted

## 2019-02-21 ENCOUNTER — Emergency Department
Admission: EM | Admit: 2019-02-21 | Discharge: 2019-02-21 | Disposition: A | Discharge status: Home / Self Care | Payer: 59 | Attending: Emergency Medicine | Admitting: Emergency Medicine

## 2019-02-21 DIAGNOSIS — Z79899 Other long term (current) drug therapy: Secondary | ICD-10-CM | POA: Diagnosis not present

## 2019-02-21 DIAGNOSIS — M25562 Pain in left knee: Secondary | ICD-10-CM | POA: Diagnosis not present

## 2019-02-21 DIAGNOSIS — Y999 Unspecified external cause status: Secondary | ICD-10-CM | POA: Insufficient documentation

## 2019-02-21 DIAGNOSIS — E119 Type 2 diabetes mellitus without complications: Secondary | ICD-10-CM | POA: Diagnosis not present

## 2019-02-21 DIAGNOSIS — Y929 Unspecified place or not applicable: Secondary | ICD-10-CM | POA: Insufficient documentation

## 2019-02-21 DIAGNOSIS — Z7984 Long term (current) use of oral hypoglycemic drugs: Secondary | ICD-10-CM | POA: Diagnosis not present

## 2019-02-21 DIAGNOSIS — Y939 Activity, unspecified: Secondary | ICD-10-CM | POA: Diagnosis not present

## 2019-02-21 DIAGNOSIS — S8002XA Contusion of left knee, initial encounter: Secondary | ICD-10-CM | POA: Diagnosis not present

## 2019-02-21 DIAGNOSIS — S8992XA Unspecified injury of left lower leg, initial encounter: Secondary | ICD-10-CM | POA: Diagnosis present

## 2019-02-21 DIAGNOSIS — M1712 Unilateral primary osteoarthritis, left knee: Secondary | ICD-10-CM | POA: Diagnosis not present

## 2019-02-21 MED ORDER — HYDROCODONE-ACETAMINOPHEN 5-325 MG PO TABS: 1.0000 | ORAL_TABLET | Freq: Four times a day (QID) | ORAL | 0 refills | Status: DC | PRN

## 2019-02-21 NOTE — ED Notes (Signed)
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
## 2019-02-21 NOTE — Discharge Instructions (Signed)
Follow-up with your primary care provider if any continued problems.  Ice and elevation as needed for pain and for swelling.  Wear knee immobilizer and Ace wrap for added support.  You do not have to wear the knee immobilizer while sleeping.  Continue taking Lodine as directed by your doctor for inflammation.  Norco is a narcotic and should only be taken while you are at home.  Do not drive or operate machinery while taking this medication.
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
## 2019-02-26 ENCOUNTER — Other Ambulatory Visit: Payer: Self-pay | Admitting: Orthopedic Surgery

## 2019-02-26 DIAGNOSIS — M25362 Other instability, left knee: Secondary | ICD-10-CM

## 2019-02-26 DIAGNOSIS — S83232A Complex tear of medial meniscus, current injury, left knee, initial encounter: Secondary | ICD-10-CM | POA: Diagnosis not present

## 2019-03-02 ENCOUNTER — Encounter (INDEPENDENT_AMBULATORY_CARE_PROVIDER_SITE_OTHER): Payer: Self-pay | Admitting: Physician Assistant

## 2019-03-02 ENCOUNTER — Ambulatory Visit (INDEPENDENT_AMBULATORY_CARE_PROVIDER_SITE_OTHER): Payer: 59 | Admitting: Physician Assistant

## 2019-03-02 ENCOUNTER — Other Ambulatory Visit: Payer: Self-pay

## 2019-03-02 DIAGNOSIS — E119 Type 2 diabetes mellitus without complications: Secondary | ICD-10-CM | POA: Diagnosis not present

## 2019-03-02 DIAGNOSIS — E291 Testicular hypofunction: Secondary | ICD-10-CM | POA: Diagnosis not present

## 2019-03-02 DIAGNOSIS — Z6833 Body mass index (BMI) 33.0-33.9, adult: Secondary | ICD-10-CM

## 2019-03-02 DIAGNOSIS — E669 Obesity, unspecified: Secondary | ICD-10-CM

## 2019-03-02 DIAGNOSIS — E559 Vitamin D deficiency, unspecified: Secondary | ICD-10-CM

## 2019-03-03 NOTE — Progress Notes (Signed)
Office: [phone]  /  Fax: [phone] TeleHealth Visit:  [name] has verbally consented to this TeleHealth visit today. The patient is located at work, the provider is located at the [employer] [facility] office. The participants in this visit include the listed provider and patient. The visit was conducted today via FaceTime.  HPI:   Chief Complaint: OBESITY [name] is here to discuss his progress with his obesity treatment plan. He is on the Category 4 plan and is following his eating plan approximately 75-80% of the time. He states he is exercising 0 minutes 0 times per week. [name] reports his weight today to be 247 lbs. He reports that he recently tore his left knee meniscus. He states he has been eating more ice cream at night.  We were unable to weigh the patient today for this TeleHealth visit. He feels as if he has maintained his weight since his last visit. He has lost 0 lbs since starting treatment with [name].  Diabetes II [name] has a diagnosis of diabetes type II and is on metformin. [name] states his fasting blood sugars are in the range of 98 and 110.  Last A1c was 6.3 on 06/22/2018. He has been working on intensive lifestyle modifications including diet, exercise, and weight loss to help control his blood glucose levels. No nausea, vomiting, or diarrhea. No polyphagia.  Vitamin D deficiency [name] has a diagnosis of Vitamin D deficiency. He is currently taking prescription Vit D and denies nausea, vomiting or muscle weakness.  ASSESSMENT AND PLAN:  No diagnosis found.  PLAN:  Diabetes II [name] has been given extensive diabetes education by myself today including ideal fasting and post-prandial blood glucose readings, individual ideal HgA1c goals  and hypoglycemia prevention. We discussed the importance of good blood sugar control to decrease the likelihood of diabetic complications such as nephropathy, neuropathy, limb loss, blindness, coronary artery  disease, and death. We discussed the importance of intensive lifestyle modification including diet, exercise and weight loss as the first line treatment for diabetes. Isley was given a refill on his metformin #60 with 0 refills. He agrees to follow-up with our clinic in 2 weeks.  Vitamin D Deficiency [name] was informed that low Vitamin D levels contributes to fatigue and are associated with obesity, breast, and colon cancer. He agrees to continue to take prescription Vit D @ 50,000 IU every week #4 with 0 refills and will follow-up for routine testing of Vitamin D, at least 2-3 times per year. He was informed of the risk of over-replacement of Vitamin D and agrees to not increase his dose unless he discusses this with [name] first. Justice agrees to follow-up with our clinic in 2 weeks.  Obesity [name] is currently in the action stage of change. As such, his goal is to continue with weight loss efforts. He has agreed to follow the Category 4 plan. [name] has been instructed to work up to a goal of 150 minutes of combined cardio and strengthening exercise per week for weight loss and overall health benefits. We discussed the following Behavioral Modification Strategies today: work on meal planning, easy cooking plans, and better snacking choices.  [name] has agreed to follow-up with our clinic in 2 weeks. He was informed of the importance of frequent follow-up visits to maximize his success with intensive lifestyle modifications for his multiple health conditions.  ALLERGIES: Allergies  Allergen Reactions   Tomato Other (See Comments)    GI distress    MEDICATIONS: Current Outpatient  Medications on File Prior to Visit  Medication Sig Dispense Refill   buPROPion (WELLBUTRIN XL) 150 MG 24 hr tablet Take 150 mg by mouth daily.   12   DULoxetine (CYMBALTA) 60 MG capsule Take 60 mg by mouth daily.      esomeprazole (NEXIUM) 40 MG capsule TAKE 1 CAPSULE BY MOUTH ONCE DAILY 90 capsule 3    etodolac (LODINE) 500 MG tablet TAKE 1 TABLET BY MOUTH TWICE DAILY AS NEEDED (MODERATE PAIN). 60 tablet 2   HYDROcodone-acetaminophen (NORCO/VICODIN) 5-325 MG tablet Take 1 tablet by mouth every 6 (six) hours as needed for moderate pain. 15 tablet 0   hydrocortisone (ANUSOL-HC) 25 MG suppository Place 1 suppository (25 mg total) rectally 2 (two) times daily as needed for hemorrhoids or anal itching. For 7 days 12 suppository 2   ipratropium (ATROVENT) 0.06 % nasal spray Place 2 sprays into both nostrils 3 (three) times daily as needed for rhinitis. 15 mL 3   metFORMIN (GLUCOPHAGE) 1000 MG tablet Take 1 tablet (1,000 mg total) by mouth 2 (two) times daily with a meal. 60 tablet 0   Multiple Vitamin (MULTIVITAMIN) capsule Take 1 capsule by mouth daily.     valsartan-hydrochlorothiazide (DIOVAN-HCT) 160-12.5 MG tablet Take 1 tablet by mouth every morning. 90 tablet 3   Vitamin D, Ergocalciferol, (DRISDOL) 1.25 MG (50000 UT) CAPS capsule Take 1 capsule (50,000 Units total) by mouth every 7 (seven) days. 4 capsule 0   No current facility-administered medications on file prior to visit.     PAST MEDICAL HISTORY: Past Medical History:  Diagnosis Date   Chest pain    Family history of adverse reaction to anesthesia    MOM-N/V, DAD-HAS PROBLEMS WITH [place] allergy    GERD (gastroesophageal reflux disease)    History of hiatal hernia    History of kidney stones    H/O   Insomnia    Joint pain    Plantar fasciitis    Seasonal allergies    Tendonitis of foot    left    PAST SURGICAL HISTORY: Past Surgical History:  Procedure Laterality Date   CARPAL TUNNEL RELEASE Right 07/03/2017   Procedure: CARPAL TUNNEL RELEASE;  Surgeon: Hessie Knows, MD;  Location: [facility] ORS;  Service: Orthopedics;  Laterality: Right;   clavide surgery  2005   CYSTOSCOPY/URETEROSCOPY/HOLMIUM LASER/STENT PLACEMENT Right 05/22/2018   Procedure: CYSTOSCOPY/URETEROSCOPY/HOLMIUM LASER/STENT  PLACEMENT;  Surgeon: Billey Co, MD;  Location: [facility] ORS;  Service: Urology;  Laterality: Right;   KNEE SURGERY Right 2013   SHOULDER SURGERY Right 2008    SOCIAL HISTORY: Social History   Tobacco Use   Smoking status: Never Smoker   Smokeless tobacco: Never Used  Substance Use Topics   Alcohol use: Yes    Alcohol/week: 0.0 standard drinks    Comment: RARE   Drug use: No    FAMILY HISTORY: Family History  Problem Relation Age of Onset   Cancer Mother        colon/rectal   Hyperlipidemia Mother    Cancer Brother        hodgekins   Cancer Maternal Grandfather        lung   Stroke Maternal Grandfather    Diabetes Father    Hypertension Father    Hyperlipidemia Father    Heart disease Father    Alcoholism Father    ROS: Review of Systems  Gastrointestinal: Negative for diarrhea, nausea and vomiting.  Musculoskeletal:       Negative for  muscle weakness.  Endo/Heme/Allergies:       Negative for polyphagia.   PHYSICAL EXAM: Pt in no acute distress  RECENT LABS AND TESTS: BMET    Component Value Date/Time   NA 142 06/22/2018 0816   NA 138 01/28/2018 1007   NA 138 05/20/2013 0815   K 4.3 06/22/2018 0816   K 3.7 05/20/2013 0815   CL 105 06/22/2018 0816   CL 106 05/20/2013 0815   CO2 28 06/22/2018 0816   CO2 26 05/20/2013 0815   GLUCOSE 112 (H) 06/22/2018 0816   GLUCOSE 101 (H) 05/20/2013 0815   BUN 18 06/22/2018 0816   BUN 18 01/28/2018 1007   BUN 20 (H) 05/20/2013 0815   CREATININE 0.99 06/22/2018 0816   CALCIUM 9.4 06/22/2018 0816   CALCIUM 8.9 05/20/2013 0815   GFRNONAA 92 06/22/2018 0816   GFRAA 107 06/22/2018 0816   Lab Results  Component Value Date   HGBA1C 6.3 (H) 06/22/2018   HGBA1C 6.2 (H) 01/28/2018   HGBA1C 6.7 11/07/2017   HGBA1C 6.3 05/08/2017   HGBA1C 6.5 11/06/2016   Lab Results  Component Value Date   INSULIN 11.6 05/13/2018   INSULIN 10.5 01/28/2018   CBC    Component Value Date/Time   WBC 6.2  06/22/2018 0816   RBC 4.98 06/22/2018 0816   HGB 13.9 06/22/2018 0816   HGB 14.6 01/28/2018 1007   HCT 41.1 06/22/2018 0816   HCT 43.5 01/28/2018 1007   PLT 249 06/22/2018 0816   PLT 250 06/23/2017 1104   MCV 82.5 06/22/2018 0816   MCV 84 01/28/2018 1007   MCV 84 11/05/2011 1606   MCH 27.9 06/22/2018 0816   MCHC 33.8 06/22/2018 0816   RDW 13.2 06/22/2018 0816   RDW 14.3 01/28/2018 1007   RDW 13.0 11/05/2011 1606   LYMPHSABS 1,358 06/22/2018 0816   LYMPHSABS 1.5 01/28/2018 1007   LYMPHSABS 1.8 11/05/2011 1606   MONOABS 0.9 04/12/2018 1820   MONOABS 0.6 11/05/2011 1606   EOSABS 360 06/22/2018 0816   EOSABS 0.2 01/28/2018 1007   EOSABS 0.1 11/05/2011 1606   BASOSABS 50 06/22/2018 0816   BASOSABS 0.0 01/28/2018 1007   BASOSABS 0.0 11/05/2011 1606   Iron/TIBC/Ferritin/ %Sat No results found for: IRON, TIBC, FERRITIN, IRONPCTSAT Lipid Panel     Component Value Date/Time   CHOL 182 06/22/2018 0816   CHOL 175 05/13/2018 0845   TRIG 82 06/22/2018 0816   HDL 65 06/22/2018 0816   HDL 51 05/13/2018 0845   CHOLHDL 2.8 06/22/2018 0816   LDLCALC 100 (H) 06/22/2018 0816   Hepatic Function Panel     Component Value Date/Time   PROT 6.6 06/22/2018 0816   PROT 7.0 01/28/2018 1007   PROT 8.5 (H) 11/05/2011 1606   ALBUMIN 4.6 04/12/2018 1820   ALBUMIN 4.4 01/28/2018 1007   ALBUMIN 4.5 11/05/2011 1606   AST 17 06/22/2018 0816   AST 36 11/05/2011 1606   ALT 18 06/22/2018 0816   ALT 53 11/05/2011 1606   ALKPHOS 63 04/12/2018 1820   ALKPHOS 147 (H) 11/05/2011 1606   BILITOT 0.3 06/22/2018 0816   BILITOT 0.3 01/28/2018 1007   BILITOT 0.5 11/05/2011 1606      Component Value Date/Time   TSH 1.330 01/28/2018 1007   TSH 1.440 01/04/2016 0818    Results for Loberg, Shaw C "NED" (MRN [id number]) as of 03/03/2019 13:57  Ref. Range 05/13/2018 08:45  Vitamin D, 25-Hydroxy Latest Ref Range: 30.0 - 100.0 ng/mL 37.8    I,  Michaelene Song, am acting as Location manager for Abby Potash,  PA-C I, Abby Potash, PA-C have reviewed above note and agree with its conten

## 2019-03-04 ENCOUNTER — Other Ambulatory Visit: Payer: Self-pay

## 2019-03-04 ENCOUNTER — Ambulatory Visit
Admission: RE | Admit: 2019-03-04 | Discharge: 2019-03-04 | Disposition: A | Discharge status: Home / Self Care | Payer: 59 | Source: Ambulatory Visit | Attending: Orthopedic Surgery | Admitting: Orthopedic Surgery

## 2019-03-04 DIAGNOSIS — M25562 Pain in left knee: Secondary | ICD-10-CM | POA: Diagnosis not present

## 2019-03-04 DIAGNOSIS — M25362 Other instability, left knee: Secondary | ICD-10-CM | POA: Insufficient documentation

## 2019-03-04 DIAGNOSIS — S8992XA Unspecified injury of left lower leg, initial encounter: Secondary | ICD-10-CM | POA: Diagnosis not present

## 2019-03-04 MED ORDER — METFORMIN HCL 1000 MG PO TABS: 1000.0000 mg | ORAL_TABLET | Freq: Two times a day (BID) | ORAL | 0 refills | Status: DC

## 2019-03-04 MED ORDER — VITAMIN D (ERGOCALCIFEROL) 1.25 MG (50000 UNIT) PO CAPS: 50000.0000 [IU] | ORAL_CAPSULE | ORAL | 0 refills | Status: DC

## 2019-03-16 ENCOUNTER — Encounter (INDEPENDENT_AMBULATORY_CARE_PROVIDER_SITE_OTHER): Payer: Self-pay | Admitting: Physician Assistant

## 2019-03-16 ENCOUNTER — Telehealth (INDEPENDENT_AMBULATORY_CARE_PROVIDER_SITE_OTHER): Payer: 59 | Admitting: Physician Assistant

## 2019-03-16 ENCOUNTER — Other Ambulatory Visit: Payer: Self-pay

## 2019-03-16 DIAGNOSIS — E559 Vitamin D deficiency, unspecified: Secondary | ICD-10-CM

## 2019-03-16 DIAGNOSIS — Z6832 Body mass index (BMI) 32.0-32.9, adult: Secondary | ICD-10-CM | POA: Diagnosis not present

## 2019-03-16 DIAGNOSIS — E669 Obesity, unspecified: Secondary | ICD-10-CM | POA: Diagnosis not present

## 2019-03-17 NOTE — Progress Notes (Signed)
Office: [phone]  /  Fax: [phone] TeleHealth Visit:  [name] has verbally consented to this TeleHealth visit today. The patient is located at work, the provider is located at the [employer] [facility] office. The participants in this visit include the listed provider and patient. The visit was conducted today via telephone call.  HPI:   Chief Complaint: OBESITY [name] is here to discuss his progress with his obesity treatment plan. He is on the Category 4 plan and is following his eating plan approximately 60% of the time. He states he is exercising 0 minutes 0 times per week. [name] reports his most recent weight to be 247 lbs. He is eating out more due to stress at work and reports he is not getting in all of his snack calories. We were unable to weigh the patient today for this TeleHealth visit. He feels as if he has maintained his weight since his last visit. He has lost 0 lbs since starting treatment with [name].  Vitamin D deficiency [name] has a diagnosis of Vitamin D deficiency. He is currently taking weekly prescription Vit D and denies nausea, vomiting or muscle weakness.  ASSESSMENT AND PLAN:  Vitamin D deficiency  Class 1 obesity with serious comorbidity and body mass index (BMI) of 32.0 to 32.9 in adult, unspecified obesity type  PLAN:  Vitamin D Deficiency [name] was informed that low Vitamin D levels contributes to fatigue and are associated with obesity, breast, and colon cancer. He agrees to continue taking weekly prescription Vit D and will follow-up for routine testing of Vitamin D, at least 2-3 times per year. He was informed of the risk of over-replacement of Vitamin D and agrees to not increase his dose unless he discusses this with [name] first. [name] agrees to follow-up with our clinic in 2 weeks.  Obesity [name] is currently in the action stage of change. As such, his goal is to continue with weight loss efforts. He has agreed to follow the  Category 4 plan. [name] has been instructed to work up to a goal of 150 minutes of combined cardio and strengthening exercise per week for weight loss and overall health benefits. We discussed the following Behavioral Modification Strategies today: work on meal planning and easy cooking plans, and keeping healthy foods in the home.  [name] has agreed to follow-up with our clinic in 2 weeks. He was informed of the importance of frequent follow-up visits to maximize his success with intensive lifestyle modifications for his multiple health conditions.  ALLERGIES: Allergies  Allergen Reactions   Tomato Other (See Comments)    GI distress    MEDICATIONS: Current Outpatient Medications on File Prior to Visit  Medication Sig Dispense Refill   buPROPion (WELLBUTRIN XL) 150 MG 24 hr tablet Take 150 mg by mouth daily.   12   DULoxetine (CYMBALTA) 60 MG capsule Take 60 mg by mouth daily.      esomeprazole (NEXIUM) 40 MG capsule TAKE 1 CAPSULE BY MOUTH ONCE DAILY 90 capsule 3   etodolac (LODINE) 500 MG tablet TAKE 1 TABLET BY MOUTH TWICE DAILY AS NEEDED (MODERATE PAIN). 60 tablet 2   HYDROcodone-acetaminophen (NORCO/VICODIN) 5-325 MG tablet Take 1 tablet by mouth every 6 (six) hours as needed for moderate pain. 15 tablet 0   hydrocortisone (ANUSOL-HC) 25 MG suppository Place 1 suppository (25 mg total) rectally 2 (two) times daily as needed for hemorrhoids or anal itching. For 7 days 12 suppository 2   ipratropium (ATROVENT) 0.06 %  nasal spray Place 2 sprays into both nostrils 3 (three) times daily as needed for rhinitis. 15 mL 3   metFORMIN (GLUCOPHAGE) 1000 MG tablet Take 1 tablet (1,000 mg total) by mouth 2 (two) times daily with a meal. 60 tablet 0   Multiple Vitamin (MULTIVITAMIN) capsule Take 1 capsule by mouth daily.     valsartan-hydrochlorothiazide (DIOVAN-HCT) 160-12.5 MG tablet Take 1 tablet by mouth every morning. 90 tablet 3   Vitamin D, Ergocalciferol, (DRISDOL) 1.25 MG  (50000 UT) CAPS capsule Take 1 capsule (50,000 Units total) by mouth every 7 (seven) days. 4 capsule 0   No current facility-administered medications on file prior to visit.     PAST MEDICAL HISTORY: Past Medical History:  Diagnosis Date   Chest pain    Family history of adverse reaction to anesthesia    MOM-N/V, DAD-HAS PROBLEMS WITH [place] allergy    GERD (gastroesophageal reflux disease)    History of hiatal hernia    History of kidney stones    H/O   Insomnia    Joint pain    Plantar fasciitis    Seasonal allergies    Tendonitis of foot    left    PAST SURGICAL HISTORY: Past Surgical History:  Procedure Laterality Date   CARPAL TUNNEL RELEASE Right 07/03/2017   Procedure: CARPAL TUNNEL RELEASE;  Surgeon: Hessie Knows, MD;  Location: [facility];  Service: Orthopedics;  Laterality: Right;   clavide surgery  2005   CYSTOSCOPY/URETEROSCOPY/HOLMIUM LASER/STENT PLACEMENT Right 05/22/2018   Procedure: CYSTOSCOPY/URETEROSCOPY/HOLMIUM LASER/STENT PLACEMENT;  Surgeon: Billey Co, MD;  Location: [facility];  Service: Urology;  Laterality: Right;   KNEE SURGERY Right 2013   SHOULDER SURGERY Right 2008    SOCIAL HISTORY: Social History   Tobacco Use   Smoking status: Never Smoker   Smokeless tobacco: Never Used  Substance Use Topics   Alcohol use: Yes    Alcohol/week: 0.0 standard drinks    Comment: RARE   Drug use: No    FAMILY HISTORY: Family History  Problem Relation Age of Onset   Cancer Mother        colon/rectal   Hyperlipidemia Mother    Cancer Brother        hodgekins   Cancer Maternal Grandfather        lung   Stroke Maternal Grandfather    Diabetes Father    Hypertension Father    Hyperlipidemia Father    Heart disease Father    Alcoholism Father    ROS: Review of Systems  Gastrointestinal: Negative for nausea and vomiting.  Musculoskeletal:       Negative for muscle weakness.   PHYSICAL EXAM: Pt in no  acute distress  RECENT LABS AND TESTS: BMET    Component Value Date/Time   NA 142 06/22/2018 0816   NA 138 01/28/2018 1007   NA 138 05/20/2013 0815   K 4.3 06/22/2018 0816   K 3.7 05/20/2013 0815   CL 105 06/22/2018 0816   CL 106 05/20/2013 0815   CO2 28 06/22/2018 0816   CO2 26 05/20/2013 0815   GLUCOSE 112 (H) 06/22/2018 0816   GLUCOSE 101 (H) 05/20/2013 0815   BUN 18 06/22/2018 0816   BUN 18 01/28/2018 1007   BUN 20 (H) 05/20/2013 0815   CREATININE 0.99 06/22/2018 0816   CALCIUM 9.4 06/22/2018 0816   CALCIUM 8.9 05/20/2013 0815   GFRNONAA 92 06/22/2018 0816   GFRAA 107 06/22/2018 0816   Lab Results  Component  Value Date   HGBA1C 6.3 (H) 06/22/2018   HGBA1C 6.2 (H) 01/28/2018   HGBA1C 6.7 11/07/2017   HGBA1C 6.3 05/08/2017   HGBA1C 6.5 11/06/2016   Lab Results  Component Value Date   INSULIN 11.6 05/13/2018   INSULIN 10.5 01/28/2018   CBC    Component Value Date/Time   WBC 6.2 06/22/2018 0816   RBC 4.98 06/22/2018 0816   HGB 13.9 06/22/2018 0816   HGB 14.6 01/28/2018 1007   HCT 41.1 06/22/2018 0816   HCT 43.5 01/28/2018 1007   PLT 249 06/22/2018 0816   PLT 250 06/23/2017 1104   MCV 82.5 06/22/2018 0816   MCV 84 01/28/2018 1007   MCV 84 11/05/2011 1606   MCH 27.9 06/22/2018 0816   MCHC 33.8 06/22/2018 0816   RDW 13.2 06/22/2018 0816   RDW 14.3 01/28/2018 1007   RDW 13.0 11/05/2011 1606   LYMPHSABS 1,358 06/22/2018 0816   LYMPHSABS 1.5 01/28/2018 1007   LYMPHSABS 1.8 11/05/2011 1606   MONOABS 0.9 04/12/2018 1820   MONOABS 0.6 11/05/2011 1606   EOSABS 360 06/22/2018 0816   EOSABS 0.2 01/28/2018 1007   EOSABS 0.1 11/05/2011 1606   BASOSABS 50 06/22/2018 0816   BASOSABS 0.0 01/28/2018 1007   BASOSABS 0.0 11/05/2011 1606   Iron/TIBC/Ferritin/ %Sat No results found for: IRON, TIBC, FERRITIN, IRONPCTSAT Lipid Panel     Component Value Date/Time   CHOL 182 06/22/2018 0816   CHOL 175 05/13/2018 0845   TRIG 82 06/22/2018 0816   HDL 65 06/22/2018  0816   HDL 51 05/13/2018 0845   CHOLHDL 2.8 06/22/2018 0816   LDLCALC 100 (H) 06/22/2018 0816   Hepatic Function Panel     Component Value Date/Time   PROT 6.6 06/22/2018 0816   PROT 7.0 01/28/2018 1007   PROT 8.5 (H) 11/05/2011 1606   ALBUMIN 4.6 04/12/2018 1820   ALBUMIN 4.4 01/28/2018 1007   ALBUMIN 4.5 11/05/2011 1606   AST 17 06/22/2018 0816   AST 36 11/05/2011 1606   ALT 18 06/22/2018 0816   ALT 53 11/05/2011 1606   ALKPHOS 63 04/12/2018 1820   ALKPHOS 147 (H) 11/05/2011 1606   BILITOT 0.3 06/22/2018 0816   BILITOT 0.3 01/28/2018 1007   BILITOT 0.5 11/05/2011 1606      Component Value Date/Time   TSH 1.330 01/28/2018 1007   TSH 1.440 01/04/2016 0818    Results for Tallo, Miro C "NED" (MRN [id number]) as of 03/17/2019 08:13  Ref. Range 05/13/2018 08:45  Vitamin D, 25-Hydroxy Latest Ref Range: 30.0 - 100.0 ng/mL 37.8   I, Michaelene Song, am acting as Location manager for [employer], PA-C I, Abby Potash, PA-C have reviewed above note and agree with its content

## 2019-03-22 ENCOUNTER — Other Ambulatory Visit: Payer: Self-pay | Admitting: Family Medicine

## 2019-03-22 DIAGNOSIS — I10 Essential (primary) hypertension: Secondary | ICD-10-CM

## 2019-04-01 ENCOUNTER — Encounter (INDEPENDENT_AMBULATORY_CARE_PROVIDER_SITE_OTHER): Payer: Self-pay | Admitting: Physician Assistant

## 2019-04-01 ENCOUNTER — Ambulatory Visit (INDEPENDENT_AMBULATORY_CARE_PROVIDER_SITE_OTHER): Payer: 59 | Admitting: Physician Assistant

## 2019-04-01 ENCOUNTER — Other Ambulatory Visit: Payer: Self-pay

## 2019-04-01 DIAGNOSIS — Z6833 Body mass index (BMI) 33.0-33.9, adult: Secondary | ICD-10-CM

## 2019-04-01 DIAGNOSIS — E559 Vitamin D deficiency, unspecified: Secondary | ICD-10-CM

## 2019-04-01 DIAGNOSIS — E119 Type 2 diabetes mellitus without complications: Secondary | ICD-10-CM

## 2019-04-01 DIAGNOSIS — E669 Obesity, unspecified: Secondary | ICD-10-CM | POA: Diagnosis not present

## 2019-04-01 MED ORDER — METFORMIN HCL 1000 MG PO TABS: 1000.0000 mg | ORAL_TABLET | Freq: Two times a day (BID) | ORAL | 0 refills | Status: DC

## 2019-04-01 MED ORDER — VITAMIN D (ERGOCALCIFEROL) 1.25 MG (50000 UNIT) PO CAPS: 50000.0000 [IU] | ORAL_CAPSULE | ORAL | 0 refills | Status: DC

## 2019-04-06 NOTE — Progress Notes (Signed)
Office: [phone]  /  Fax: [phone] TeleHealth Visit:  [name] has verbally consented to this TeleHealth visit today. The patient is located at work, the provider is located at the [employer] [facility] office. The participants in this visit include the listed provider and patient and any and all parties involved. The visit was conducted today via telephone. [name] was unable to use realtime audiovisual technology today and the telehealth visit was conducted via telephone.  HPI:   Chief Complaint: OBESITY [name] is here to discuss his progress with his obesity treatment plan. He is on the Category 4 plan and is following his eating plan approximately 90 % of the time. He states he is walking a small amount. [name] reports that he has been very stressed with work, but he has been trying to follow the plan. He has been doing some stress eating. We were unable to weigh the patient today for this TeleHealth visit. He feels as if he has maintained weight since his last visit. He has lost 0 lbs since starting treatment with [name].  Vitamin D deficiency [name] has a diagnosis of vitamin D deficiency. He is currently taking vit D. [name] admits fatigue and denies nausea, vomiting or muscle weakness.  Diabetes II [name] has a diagnosis of diabetes type II. [name] states fasting BGs range between 100 and 120. [name] is on metformin and he denies nausea, vomiting or diarrhea. He has been working on intensive lifestyle modifications including diet, exercise, and weight loss to help control his blood glucose levels.  ASSESSMENT AND PLAN:  Class 1 obesity with serious comorbidity and body mass index (BMI) of 33.0 to 33.9 in adult, unspecified obesity type  Type 2 diabetes mellitus without complication, without long-term current use of insulin ([name]) - Plan: metFORMIN (GLUCOPHAGE) 1000 MG tablet  Vitamin D deficiency - Plan: Vitamin D, Ergocalciferol, (DRISDOL) 1.25 MG (50000 UT) CAPS  capsule  PLAN:  Vitamin D Deficiency [name] was informed that low vitamin D levels contributes to fatigue and are associated with obesity, breast, and colon cancer. He agrees to continue to take prescription Vit D @50 ,000 IU every week #4 with no refills and will follow up for routine testing of vitamin D, at least 2-3 times per year. He was informed of the risk of over-replacement of vitamin D and agrees to not increase his dose unless he discusses this with [name] first. [name] agrees to follow up with our clinic in 2 weeks.  Diabetes II [name] has been given extensive diabetes education by myself today including ideal fasting and post-prandial blood glucose readings, individual ideal Hgb A1c goals and hypoglycemia prevention. We discussed the importance of good blood sugar control to decrease the likelihood of diabetic complications such as nephropathy, neuropathy, limb loss, blindness, coronary artery disease, and death. We discussed the importance of intensive lifestyle modification including diet, exercise and weight loss as the first line treatment for diabetes. [name] agrees to continue metformin 500 mg 2 times daily with a meal #60 with no refills and follow up at the agreed upon time.  Obesity [name] is currently in the action stage of change. As such, his goal is to continue with weight loss efforts He has agreed to keep a food journal with 2000 to 2100 calories and 125 grams of protein daily and follow the Category 4 plan  [name] has been instructed to work up to a goal of 150 minutes of combined cardio and strengthening exercise per week for weight loss and  overall health benefits. We discussed the following Behavioral Modification Strategies today: keeping healthy foods in the home and work on meal planning and easy cooking plans  [name] has agreed to follow up with our clinic in 2 weeks. He was informed of the importance of frequent follow up visits to maximize his success with intensive  lifestyle modifications for his multiple health conditions.  ALLERGIES: Allergies  Allergen Reactions   Tomato Other (See Comments)    GI distress    MEDICATIONS: Current Outpatient Medications on File Prior to Visit  Medication Sig Dispense Refill   buPROPion (WELLBUTRIN XL) 150 MG 24 hr tablet Take 150 mg by mouth daily.   12   DULoxetine (CYMBALTA) 60 MG capsule Take 60 mg by mouth daily.      esomeprazole (NEXIUM) 40 MG capsule TAKE 1 CAPSULE BY MOUTH ONCE DAILY 90 capsule 3   etodolac (LODINE) 500 MG tablet TAKE 1 TABLET BY MOUTH TWICE DAILY AS NEEDED (MODERATE PAIN). 60 tablet 2   HYDROcodone-acetaminophen (NORCO/VICODIN) 5-325 MG tablet Take 1 tablet by mouth every 6 (six) hours as needed for moderate pain. 15 tablet 0   hydrocortisone (ANUSOL-HC) 25 MG suppository Place 1 suppository (25 mg total) rectally 2 (two) times daily as needed for hemorrhoids or anal itching. For 7 days 12 suppository 2   ipratropium (ATROVENT) 0.06 % nasal spray Place 2 sprays into both nostrils 3 (three) times daily as needed for rhinitis. 15 mL 3   Multiple Vitamin (MULTIVITAMIN) capsule Take 1 capsule by mouth daily.     valsartan-hydrochlorothiazide (DIOVAN-HCT) 160-12.5 MG tablet TAKE 1 TABLET BY MOUTH EVERY MORNING. 90 tablet 3   No current facility-administered medications on file prior to visit.     PAST MEDICAL HISTORY: Past Medical History:  Diagnosis Date   Chest pain    Family history of adverse reaction to anesthesia    MOM-N/V, DAD-HAS PROBLEMS WITH [place] allergy    GERD (gastroesophageal reflux disease)    History of hiatal hernia    History of kidney stones    H/O   Insomnia    Joint pain    Plantar fasciitis    Seasonal allergies    Tendonitis of foot    left    PAST SURGICAL HISTORY: Past Surgical History:  Procedure Laterality Date   CARPAL TUNNEL RELEASE Right 07/03/2017   Procedure: CARPAL TUNNEL RELEASE;  Surgeon: Hessie Knows,  MD;  Location: [facility];  Service: Orthopedics;  Laterality: Right;   clavide surgery  2005   CYSTOSCOPY/URETEROSCOPY/HOLMIUM LASER/STENT PLACEMENT Right 05/22/2018   Procedure: CYSTOSCOPY/URETEROSCOPY/HOLMIUM LASER/STENT PLACEMENT;  Surgeon: Billey Co, MD;  Location: [facility];  Service: Urology;  Laterality: Right;   KNEE SURGERY Right 2013   SHOULDER SURGERY Right 2008    SOCIAL HISTORY: Social History   Tobacco Use   Smoking status: Never Smoker   Smokeless tobacco: Never Used  Substance Use Topics   Alcohol use: Yes    Alcohol/week: 0.0 standard drinks    Comment: RARE   Drug use: No    FAMILY HISTORY: Family History  Problem Relation Age of Onset   Cancer Mother        colon/rectal   Hyperlipidemia Mother    Cancer Brother        hodgekins   Cancer Maternal Grandfather        lung   Stroke Maternal Grandfather    Diabetes Father    Hypertension Father    Hyperlipidemia Father  Heart disease Father    Alcoholism Father     ROS: Review of Systems  Constitutional: Positive for malaise/fatigue. Negative for weight loss.  Gastrointestinal: Negative for diarrhea, nausea and vomiting.  Musculoskeletal:       Negative for muscle weakness    PHYSICAL EXAM: Pt in no acute distress  RECENT LABS AND TESTS: BMET    Component Value Date/Time   NA 142 06/22/2018 0816   NA 138 01/28/2018 1007   NA 138 05/20/2013 0815   K 4.3 06/22/2018 0816   K 3.7 05/20/2013 0815   CL 105 06/22/2018 0816   CL 106 05/20/2013 0815   CO2 28 06/22/2018 0816   CO2 26 05/20/2013 0815   GLUCOSE 112 (H) 06/22/2018 0816   GLUCOSE 101 (H) 05/20/2013 0815   BUN 18 06/22/2018 0816   BUN 18 01/28/2018 1007   BUN 20 (H) 05/20/2013 0815   CREATININE 0.99 06/22/2018 0816   CALCIUM 9.4 06/22/2018 0816   CALCIUM 8.9 05/20/2013 0815   GFRNONAA 92 06/22/2018 0816   GFRAA 107 06/22/2018 0816   Lab Results  Component Value Date   HGBA1C 6.3 (H) 06/22/2018   HGBA1C  6.2 (H) 01/28/2018   HGBA1C 6.7 11/07/2017   HGBA1C 6.3 05/08/2017   HGBA1C 6.5 11/06/2016   Lab Results  Component Value Date   INSULIN 11.6 05/13/2018   INSULIN 10.5 01/28/2018   CBC    Component Value Date/Time   WBC 6.2 06/22/2018 0816   RBC 4.98 06/22/2018 0816   HGB 13.9 06/22/2018 0816   HGB 14.6 01/28/2018 1007   HCT 41.1 06/22/2018 0816   HCT 43.5 01/28/2018 1007   PLT 249 06/22/2018 0816   PLT 250 06/23/2017 1104   MCV 82.5 06/22/2018 0816   MCV 84 01/28/2018 1007   MCV 84 11/05/2011 1606   MCH 27.9 06/22/2018 0816   MCHC 33.8 06/22/2018 0816   RDW 13.2 06/22/2018 0816   RDW 14.3 01/28/2018 1007   RDW 13.0 11/05/2011 1606   LYMPHSABS 1,358 06/22/2018 0816   LYMPHSABS 1.5 01/28/2018 1007   LYMPHSABS 1.8 11/05/2011 1606   MONOABS 0.9 04/12/2018 1820   MONOABS 0.6 11/05/2011 1606   EOSABS 360 06/22/2018 0816   EOSABS 0.2 01/28/2018 1007   EOSABS 0.1 11/05/2011 1606   BASOSABS 50 06/22/2018 0816   BASOSABS 0.0 01/28/2018 1007   BASOSABS 0.0 11/05/2011 1606   Iron/TIBC/Ferritin/ %Sat No results found for: IRON, TIBC, FERRITIN, IRONPCTSAT Lipid Panel     Component Value Date/Time   CHOL 182 06/22/2018 0816   CHOL 175 05/13/2018 0845   TRIG 82 06/22/2018 0816   HDL 65 06/22/2018 0816   HDL 51 05/13/2018 0845   CHOLHDL 2.8 06/22/2018 0816   LDLCALC 100 (H) 06/22/2018 0816   Hepatic Function Panel     Component Value Date/Time   PROT 6.6 06/22/2018 0816   PROT 7.0 01/28/2018 1007   PROT 8.5 (H) 11/05/2011 1606   ALBUMIN 4.6 04/12/2018 1820   ALBUMIN 4.4 01/28/2018 1007   ALBUMIN 4.5 11/05/2011 1606   AST 17 06/22/2018 0816   AST 36 11/05/2011 1606   ALT 18 06/22/2018 0816   ALT 53 11/05/2011 1606   ALKPHOS 63 04/12/2018 1820   ALKPHOS 147 (H) 11/05/2011 1606   BILITOT 0.3 06/22/2018 0816   BILITOT 0.3 01/28/2018 1007   BILITOT 0.5 11/05/2011 1606      Component Value Date/Time   TSH 1.330 01/28/2018 1007   TSH 1.440 01/04/2016 0818  Ref. Range 05/13/2018 08:45  Vitamin D, 25-Hydroxy Latest Ref Range: 30.0 - 100.0 ng/mL 37.8    I, Doreene Nest, am acting as Location manager for Abby Potash, PA-C I, Abby Potash, PA-C have reviewed above note and agree with its content

## 2019-04-19 ENCOUNTER — Other Ambulatory Visit: Payer: Self-pay

## 2019-04-19 ENCOUNTER — Telehealth (INDEPENDENT_AMBULATORY_CARE_PROVIDER_SITE_OTHER): Payer: 59 | Admitting: Physician Assistant

## 2019-04-19 ENCOUNTER — Encounter (INDEPENDENT_AMBULATORY_CARE_PROVIDER_SITE_OTHER): Payer: Self-pay | Admitting: Physician Assistant

## 2019-04-19 DIAGNOSIS — E119 Type 2 diabetes mellitus without complications: Secondary | ICD-10-CM

## 2019-04-19 DIAGNOSIS — E559 Vitamin D deficiency, unspecified: Secondary | ICD-10-CM

## 2019-04-19 DIAGNOSIS — E669 Obesity, unspecified: Secondary | ICD-10-CM | POA: Diagnosis not present

## 2019-04-19 DIAGNOSIS — Z6833 Body mass index (BMI) 33.0-33.9, adult: Secondary | ICD-10-CM | POA: Diagnosis not present

## 2019-04-19 MED ORDER — VITAMIN D (ERGOCALCIFEROL) 1.25 MG (50000 UNIT) PO CAPS: 50000.0000 [IU] | ORAL_CAPSULE | ORAL | 0 refills | Status: DC

## 2019-04-19 NOTE — Progress Notes (Signed)
Office: [phone]  /  Fax: [phone] TeleHealth Visit:  [name] has verbally consented to this TeleHealth visit today. The patient is located at work, the provider is located at the [employer] [facility] office. The participants in this visit include the listed provider and patient. The visit was conducted today via telephone call.  HPI:   Chief Complaint: OBESITY [name] is here to discuss his progress with his obesity treatment plan. He is on the Category 4 plan and is following his eating plan approximately 75% of the time. He states he is exercising 0 minutes 0 times per week. Ac reports that he has been working extra hours, is very stressed with work and is eating when he can. He is having trouble getting in all of his food and is not meal planning.  We were unable to weigh the patient today for this TeleHealth visit. He feels as if he has gained 1-2 lbs since his last visit. He has lost 0 lbs since starting treatment with [name].  Vitamin D deficiency [name] has a diagnosis of Vitamin D deficiency. He is currently taking prescription Vit D and denies nausea, vomiting or muscle weakness.  Diabetes II [name] has a diagnosis of diabetes type II and is on metformin. [name] states fasting blood sugars range between 100 and 110. Last A1c was 6.3 on 06/22/2018. He has been working on intensive lifestyle modifications including diet, exercise, and weight loss to help control his blood glucose levels. No nausea, vomiting, diarrhea, or polyphagia. No hypoglycemia.  ASSESSMENT AND PLAN:  Type 2 diabetes mellitus without complication, without long-term current use of insulin (HCC)  Vitamin D deficiency - Plan: Vitamin D, Ergocalciferol, (DRISDOL) 1.25 MG (50000 UT) CAPS capsule  Class 1 obesity with serious comorbidity and body mass index (BMI) of 33.0 to 33.9 in adult, unspecified obesity type  PLAN:  Vitamin D Deficiency [name] was informed that low Vitamin D levels  contributes to fatigue and are associated with obesity, breast, and colon cancer. He agrees to continue to take prescription Vit D @ 50,000 IU every week #4 with 0 refills and will follow-up for routine testing of Vitamin D, at least 2-3 times per year. He was informed of the risk of over-replacement of Vitamin D and agrees to not increase his dose unless he discusses this with [name] first. [name] agrees to follow-up with our clinic in 2 weeks.  Diabetes II [name] has been given extensive diabetes education by myself today including ideal fasting and post-prandial blood glucose readings, individual ideal HgA1c goals  and hypoglycemia prevention. We discussed the importance of good blood sugar control to decrease the likelihood of diabetic complications such as nephropathy, neuropathy, limb loss, blindness, coronary artery disease, and death. We discussed the importance of intensive lifestyle modification including diet, exercise and weight loss as the first line treatment for diabetes. [name] agrees to continue his diabetes medications and will follow-up at the agreed upon time.  Obesity [name] is currently in the action stage of change. As such, his goal is to continue with weight loss efforts. He has agreed to follow the Category 4 plan. [name] has been instructed to work up to a goal of 150 minutes of combined cardio and strengthening exercise per week for weight loss and overall health benefits. We discussed the following Behavioral Modification Strategies today: work on meal planning and easy cooking plans, and keeping healthy foods in the home.  [name] has agreed to follow-up with our clinic in 2 weeks.  He was informed of the importance of frequent follow-up visits to maximize his success with intensive lifestyle modifications for his multiple health conditions.  ALLERGIES: Allergies  Allergen Reactions   Tomato Other (See Comments)    GI distress    MEDICATIONS: Current Outpatient  Medications on File Prior to Visit  Medication Sig Dispense Refill   buPROPion (WELLBUTRIN XL) 150 MG 24 hr tablet Take 150 mg by mouth daily.   12   DULoxetine (CYMBALTA) 60 MG capsule Take 60 mg by mouth daily.      esomeprazole (NEXIUM) 40 MG capsule TAKE 1 CAPSULE BY MOUTH ONCE DAILY 90 capsule 3   etodolac (LODINE) 500 MG tablet TAKE 1 TABLET BY MOUTH TWICE DAILY AS NEEDED (MODERATE PAIN). 60 tablet 2   HYDROcodone-acetaminophen (NORCO/VICODIN) 5-325 MG tablet Take 1 tablet by mouth every 6 (six) hours as needed for moderate pain. 15 tablet 0   hydrocortisone (ANUSOL-HC) 25 MG suppository Place 1 suppository (25 mg total) rectally 2 (two) times daily as needed for hemorrhoids or anal itching. For 7 days 12 suppository 2   ipratropium (ATROVENT) 0.06 % nasal spray Place 2 sprays into both nostrils 3 (three) times daily as needed for rhinitis. 15 mL 3   metFORMIN (GLUCOPHAGE) 1000 MG tablet Take 1 tablet (1,000 mg total) by mouth 2 (two) times daily with a meal. 60 tablet 0   Multiple Vitamin (MULTIVITAMIN) capsule Take 1 capsule by mouth daily.     valsartan-hydrochlorothiazide (DIOVAN-HCT) 160-12.5 MG tablet TAKE 1 TABLET BY MOUTH EVERY MORNING. 90 tablet 3   No current facility-administered medications on file prior to visit.     PAST MEDICAL HISTORY: Past Medical History:  Diagnosis Date   Chest pain    Family history of adverse reaction to anesthesia    MOM-N/V, DAD-HAS PROBLEMS WITH [place] allergy    GERD (gastroesophageal reflux disease)    History of hiatal hernia    History of kidney stones    H/O   Insomnia    Joint pain    Plantar fasciitis    Seasonal allergies    Tendonitis of foot    left    PAST SURGICAL HISTORY: Past Surgical History:  Procedure Laterality Date   CARPAL TUNNEL RELEASE Right 07/03/2017   Procedure: CARPAL TUNNEL RELEASE;  Surgeon: Hessie Knows, MD;  Location: [facility];  Service: Orthopedics;  Laterality: Right;    clavide surgery  2005   CYSTOSCOPY/URETEROSCOPY/HOLMIUM LASER/STENT PLACEMENT Right 05/22/2018   Procedure: CYSTOSCOPY/URETEROSCOPY/HOLMIUM LASER/STENT PLACEMENT;  Surgeon: Billey Co, MD;  Location: [facility] OR[facility];  Service: Urology;  Laterality: Right;   KNEE SURGERY Right 2013   SHOULDER SURGERY Right 2008    SOCIAL HISTORY: Social History   Tobacco Use   Smoking status: Never Smoker   Smokeless tobacco: Never Used  Substance Use Topics   Alcohol use: Yes    Alcohol/week: 0.0 standard drinks    Comment: RARE   Drug use: No    FAMILY HISTORY: Family History  Problem Relation Age of Onset   Cancer Mother        colon/rectal   Hyperlipidemia Mother    Cancer Brother        hodgekins   Cancer Maternal Grandfather        lung   Stroke Maternal Grandfather    Diabetes Father    Hypertension Father    Hyperlipidemia Father    Heart disease Father    Alcoholism Father    ROS: Review of  Systems  Gastrointestinal: Negative for diarrhea, nausea and vomiting.  Musculoskeletal:       Negative for muscle weakness.  Endo/Heme/Allergies:       Negative for polyphagia. Negative for hypoglycemia.   PHYSICAL EXAM: Pt in no acute distress  RECENT LABS AND TESTS: BMET    Component Value Date/Time   NA 142 06/22/2018 0816   NA 138 01/28/2018 1007   NA 138 05/20/2013 0815   K 4.3 06/22/2018 0816   K 3.7 05/20/2013 0815   CL 105 06/22/2018 0816   CL 106 05/20/2013 0815   CO2 28 06/22/2018 0816   CO2 26 05/20/2013 0815   GLUCOSE 112 (H) 06/22/2018 0816   GLUCOSE 101 (H) 05/20/2013 0815   BUN 18 06/22/2018 0816   BUN 18 01/28/2018 1007   BUN 20 (H) 05/20/2013 0815   CREATININE 0.99 06/22/2018 0816   CALCIUM 9.4 06/22/2018 0816   CALCIUM 8.9 05/20/2013 0815   GFRNONAA 92 06/22/2018 0816   GFRAA 107 06/22/2018 0816   Lab Results  Component Value Date   HGBA1C 6.3 (H) 06/22/2018   HGBA1C 6.2 (H) 01/28/2018   HGBA1C 6.7 11/07/2017   HGBA1C 6.3  05/08/2017   HGBA1C 6.5 11/06/2016   Lab Results  Component Value Date   INSULIN 11.6 05/13/2018   INSULIN 10.5 01/28/2018   CBC    Component Value Date/Time   WBC 6.2 06/22/2018 0816   RBC 4.98 06/22/2018 0816   HGB 13.9 06/22/2018 0816   HGB 14.6 01/28/2018 1007   HCT 41.1 06/22/2018 0816   HCT 43.5 01/28/2018 1007   PLT 249 06/22/2018 0816   PLT 250 06/23/2017 1104   MCV 82.5 06/22/2018 0816   MCV 84 01/28/2018 1007   MCV 84 11/05/2011 1606   MCH 27.9 06/22/2018 0816   MCHC 33.8 06/22/2018 0816   RDW 13.2 06/22/2018 0816   RDW 14.3 01/28/2018 1007   RDW 13.0 11/05/2011 1606   LYMPHSABS 1,358 06/22/2018 0816   LYMPHSABS 1.5 01/28/2018 1007   LYMPHSABS 1.8 11/05/2011 1606   MONOABS 0.9 04/12/2018 1820   MONOABS 0.6 11/05/2011 1606   EOSABS 360 06/22/2018 0816   EOSABS 0.2 01/28/2018 1007   EOSABS 0.1 11/05/2011 1606   BASOSABS 50 06/22/2018 0816   BASOSABS 0.0 01/28/2018 1007   BASOSABS 0.0 11/05/2011 1606   Iron/TIBC/Ferritin/ %Sat No results found for: IRON, TIBC, FERRITIN, IRONPCTSAT Lipid Panel     Component Value Date/Time   CHOL 182 06/22/2018 0816   CHOL 175 05/13/2018 0845   TRIG 82 06/22/2018 0816   HDL 65 06/22/2018 0816   HDL 51 05/13/2018 0845   CHOLHDL 2.8 06/22/2018 0816   LDLCALC 100 (H) 06/22/2018 0816   Hepatic Function Panel     Component Value Date/Time   PROT 6.6 06/22/2018 0816   PROT 7.0 01/28/2018 1007   PROT 8.5 (H) 11/05/2011 1606   ALBUMIN 4.6 04/12/2018 1820   ALBUMIN 4.4 01/28/2018 1007   ALBUMIN 4.5 11/05/2011 1606   AST 17 06/22/2018 0816   AST 36 11/05/2011 1606   ALT 18 06/22/2018 0816   ALT 53 11/05/2011 1606   ALKPHOS 63 04/12/2018 1820   ALKPHOS 147 (H) 11/05/2011 1606   BILITOT 0.3 06/22/2018 0816   BILITOT 0.3 01/28/2018 1007   BILITOT 0.5 11/05/2011 1606      Component Value Date/Time   TSH 1.330 01/28/2018 1007   TSH 1.440 01/04/2016 0818   Results for Matthews, Carless C "NED" (MRN [id number]) as of  04/19/2019  14:15  Ref. Range 05/13/2018 08:45  Vitamin D, 25-Hydroxy Latest Ref Range: 30.0 - 100.0 ng/mL 37.8    I, Michaelene Song, am acting as Location manager for [employer], PA-C I, Abby Potash, PA-C have reviewed above note and agree with its content

## 2019-04-27 ENCOUNTER — Other Ambulatory Visit: Payer: Self-pay

## 2019-04-27 ENCOUNTER — Encounter: Payer: Self-pay | Admitting: Family Medicine

## 2019-04-27 ENCOUNTER — Ambulatory Visit (INDEPENDENT_AMBULATORY_CARE_PROVIDER_SITE_OTHER): Payer: 59 | Admitting: Family Medicine

## 2019-04-27 DIAGNOSIS — R6889 Other general symptoms and signs: Secondary | ICD-10-CM | POA: Diagnosis not present

## 2019-04-27 DIAGNOSIS — Z20822 Contact with and (suspected) exposure to covid-19: Secondary | ICD-10-CM

## 2019-04-27 DIAGNOSIS — J019 Acute sinusitis, unspecified: Secondary | ICD-10-CM

## 2019-04-27 NOTE — Addendum Note (Signed)
Addended by: Olin Hauser on: 04/27/2019 10:26 AM   Modules accepted: Orders

## 2019-04-27 NOTE — Patient Instructions (Addendum)
When you get your result back - please contact us as well to notify me so we can review your result also.  Once you get your result, if Amy needs she can forward it to health at work, let me know if you need [name] to help.   You may have coronavirus / [place] Testing Information  I have placed an order in the [facility] system.  All you need to do is arrive at a testing site. No appointment needed.  Hours (Open 8 a.m. - 3:45 p.m.) LAST TEST completed at 3:30pm  [place]: [facility] at [facility], [address] [place], [place]: [place], [place], [place], [place] (entrance off [address])  [place]: [place]. Main [address] [place], [place] (across from [facility])  Test result may take 2-7 days to result. You will be notified by MyChart or by Phone.  Phone: [phone] [facility] contact, can inquire about status of test result)  If negative test - they will call you with result. If abnormal or positive test you will be notified as well and our office will contact you to help further with treatment plan.  May take Tylenol as needed for aches pains and fever. Prefer to avoid Ibuprofen if can help it, to avoid complication from virus.  REQUIRED self quarantine to [place] - advised to avoid all exposure with others while during treatment. Should continue to quarantine for up to 7-14 days, pending resolution of symptoms, if symptoms resolve by 7 days and is afebrile >3 days - may STOP self quarantine at that time.  If symptoms do not resolve or significantly improve OR if WORSENING - fever / cough - or worsening shortness of breath - then should contact us and seek advice on next steps in treatment at home vs where/when to seek care at Urgent Care or Hospital ED for further intervention   Please schedule a Follow-up Appointment to: Return in about 1  week (around 05/04/2019), or if symptoms worsen or fail to improve.  If you have any other questions or concerns, please feel free to call the office or send a message through [place]. You may also schedule an earlier appointment if necessary.  Additionally, you may be receiving a survey about your experience at our office within a few days to 1 week by e-mail or mail. We value your feedback.  [name], DO [place]

## 2019-04-27 NOTE — Progress Notes (Signed)
Virtual Visit via Telephone The purpose of this virtual visit is to provide medical care while limiting exposure to the novel coronavirus (COVID19) for both patient and office staff.  Consent was obtained for phone visit:  Yes.   Answered questions that patient had about telehealth interaction:  Yes.   I discussed the limitations, risks, security and privacy concerns of performing an evaluation and management service by telephone. I also discussed with the patient that there may be a patient responsible charge related to this service. The patient expressed understanding and agreed to proceed.  Patient Location: Home Provider Location: [facility])   ---------------------------------------------------------------------- Chief Complaint  Patient presents with  . Sinus Problem    nasal congestion and drainage, sore throat, cough, may ahve fever haven't checked denies SOB    S: Reviewed CMA documentation. I have called patient and gathered additional HPI as follows:  SINUSITIS / CONGESTION Reports that symptoms started yesterday, he describes some allergy type symptoms. He is concerned enough to request COVID test to be on safe side. - Tried OTC medicine as needed some minor relief  His wife is a Adult nurse. She will call Health at Work for recommendations.  Patient currently works still, he wears mask. He is self employed. Denies any high risk travel to areas of current concern for COVID19. Denies any RECENT known or suspected exposure to person with or possibly with COVID19. He says months ago known exposure.   Denies any chills, sweats, body ache,  shortness of breath, headache, abdominal pain, diarrhea  -------------------------------------------------------------------------- O: No physical exam performed due to remote telephone encounter.  -------------------------------------------------------------------------- A&P:   POSSIBLE COVID19 /  SINUSITIS  Suspected Acute Rhinosinusitis, possible for benign viral etiology vs allergic etiology vs possible COVID19 cannot rule out. - Without dyspnea - Questionable fever - No comorbid pulmonary conditions (asthma, COPD) or immunocompromise  Recommend COVID19 testing to be performed ASAP, location recommended [facility] test site [facility]), advised patient info on test site and procedures for testing, first come first serve, no orders or apt needed.  See quarantine recommendations below Out of work until test result, if negative will need work note to return OTC medications recommended for symptoms. Recommend Tylenol PRN for fever or other viral symptoms   No orders of the defined types were placed in this encounter.   REQUIRED self quarantine to [place] - advised to avoid all exposure with others while during TESTING (Pending result) and treatment. Should continue to quarantine for up to 7-14 days - pending resolution of symptoms, if TEST IS NEGATIVE and symptoms resolve by 7 days and is afebrile >3 days - may STOP self quarantine at that time. IF test is POSITIVE then will require 10 additional day quarantine after date of positive test result.   If symptoms do not resolve or significantly improve OR if WORSENING - fever / cough - or worsening shortness of breath - then should contact us and seek advice on next steps in treatment at home vs where/when to seek care at Urgent Care or Hospital ED for further intervention and possible testing if indicated.  Patient verbalizes understanding with the above medical recommendations including the limitation of remote medical advice.  Specific follow-up / call-back criteria were given for patient to follow-up or seek medical care more urgently if needed.   - Time spent in direct consultation with patient on phone: 9 minutes  [name], [facility] 04/27/2019,  9:54 AM

## 2019-04-28 ENCOUNTER — Encounter (INDEPENDENT_AMBULATORY_CARE_PROVIDER_SITE_OTHER): Payer: Self-pay

## 2019-04-28 LAB — NOVEL CORONAVIRUS, NAA: SARS-CoV-2, NAA: DETECTED — AB

## 2019-04-29 ENCOUNTER — Encounter (INDEPENDENT_AMBULATORY_CARE_PROVIDER_SITE_OTHER): Payer: Self-pay

## 2019-04-29 ENCOUNTER — Telehealth: Payer: Self-pay | Admitting: *Deleted

## 2019-04-29 NOTE — Telephone Encounter (Signed)
I called pt regarding his Mychart response for the COVID-19 monitoring.    He said he developed diarrhea. He said,  "It was my dinner that upset my stomach".    "I took some Pepto Bismal and I'm fine now".

## 2019-04-30 ENCOUNTER — Encounter (INDEPENDENT_AMBULATORY_CARE_PROVIDER_SITE_OTHER): Payer: Self-pay

## 2019-05-01 ENCOUNTER — Encounter (INDEPENDENT_AMBULATORY_CARE_PROVIDER_SITE_OTHER): Payer: Self-pay

## 2019-05-02 ENCOUNTER — Encounter (INDEPENDENT_AMBULATORY_CARE_PROVIDER_SITE_OTHER): Payer: Self-pay

## 2019-05-03 ENCOUNTER — Telehealth (INDEPENDENT_AMBULATORY_CARE_PROVIDER_SITE_OTHER): Payer: 59 | Admitting: Physician Assistant

## 2019-05-03 ENCOUNTER — Encounter (INDEPENDENT_AMBULATORY_CARE_PROVIDER_SITE_OTHER): Payer: Self-pay | Admitting: Physician Assistant

## 2019-05-03 ENCOUNTER — Encounter (INDEPENDENT_AMBULATORY_CARE_PROVIDER_SITE_OTHER): Payer: Self-pay

## 2019-05-03 ENCOUNTER — Other Ambulatory Visit: Payer: Self-pay

## 2019-05-03 DIAGNOSIS — E669 Obesity, unspecified: Secondary | ICD-10-CM | POA: Diagnosis not present

## 2019-05-03 DIAGNOSIS — Z6833 Body mass index (BMI) 33.0-33.9, adult: Secondary | ICD-10-CM | POA: Diagnosis not present

## 2019-05-03 DIAGNOSIS — E559 Vitamin D deficiency, unspecified: Secondary | ICD-10-CM

## 2019-05-03 DIAGNOSIS — E119 Type 2 diabetes mellitus without complications: Secondary | ICD-10-CM

## 2019-05-03 MED ORDER — METFORMIN HCL 1000 MG PO TABS: 1000.0000 mg | ORAL_TABLET | Freq: Two times a day (BID) | ORAL | 0 refills | Status: DC

## 2019-05-04 ENCOUNTER — Encounter (INDEPENDENT_AMBULATORY_CARE_PROVIDER_SITE_OTHER): Payer: Self-pay

## 2019-05-05 ENCOUNTER — Encounter (INDEPENDENT_AMBULATORY_CARE_PROVIDER_SITE_OTHER): Payer: Self-pay

## 2019-05-05 DIAGNOSIS — M1990 Unspecified osteoarthritis, unspecified site: Secondary | ICD-10-CM

## 2019-05-05 NOTE — Progress Notes (Signed)
Office: [phone]  /  Fax: [phone] TeleHealth Visit:  [name] has verbally consented to this TeleHealth visit today. The patient is located at home, the provider is located at the [employer] [facility] office. The participants in this visit include the listed provider and patient. [name] was unable to use realtime audiovisual technology today and the telehealth visit was conducted via telephone.   HPI:   Chief Complaint: OBESITY [name] is here to discuss his progress with his obesity treatment plan. He is on the Category 4 plan and is following his eating plan approximately 50 % of the time. He states he is exercising 0 minutes 0 times per week. [name] reports that he has maintained his weight. He was just diagnosed with COVID and is quarantining at home. His appetite has been down and he has been eating a lot of soup. We were unable to weigh the patient today for this TeleHealth visit. He feels as if he has maintained his weight since his last visit. He has lost 0 lbs since starting treatment with [name].  Diabetes II [name] has a diagnosis of diabetes type II. [name] is on metformin and denies nausea, vomiting, or diarrhea. Last A1c was 6.3. He denies hypoglycemia. He has been working on intensive lifestyle modifications including diet, exercise, and weight loss to help control his blood glucose levels.  Vitamin D Deficiency [name] has a diagnosis of vitamin D deficiency. He is currently taking prescription Vit D and denies nausea, vomiting or muscle weakness.  ASSESSMENT AND PLAN:  Type 2 diabetes mellitus without complication, without long-term current use of insulin ([name]) - Plan: metFORMIN (GLUCOPHAGE) 1000 MG tablet  Class 1 obesity with serious comorbidity and body mass index (BMI) of 33.0 to 33.9 in adult, unspecified obesity type  Vitamin D deficiency  PLAN:  Diabetes II [name] has been given extensive diabetes education by myself today including ideal  fasting and post-prandial blood glucose readings, individual ideal Hgb A1c goals and hypoglycemia prevention. We discussed the importance of good blood sugar control to decrease the likelihood of diabetic complications such as nephropathy, neuropathy, limb loss, blindness, coronary artery disease, and death. We discussed the importance of intensive lifestyle modification including diet, exercise and weight loss as the first line treatment for diabetes. [name] agrees to continue taking metformin 500 mg BID #60 and we will refill for 1 month. [name] agrees to follow up with our clinic in 2 weeks.  Vitamin D Deficiency [name] was informed that low vitamin D levels contributes to fatigue and are associated with obesity, breast, and colon cancer. [name] agrees to continue taking prescription Vit D 50,000 IU every week and will follow up for routine testing of vitamin D, at least 2-3 times per year. He was informed of the risk of over-replacement of vitamin D and agrees to not increase his dose unless he discusses this with [name] first.  Obesity [name] is currently in the action stage of change. As such, his goal is to continue with weight loss efforts He has agreed to follow the Category 4 plan [name] has been instructed to work up to a goal of 150 minutes of combined cardio and strengthening exercise per week for weight loss and overall health benefits. We discussed the following Behavioral Modification Strategies today: increasing lean protein intake and work on meal planning and easy cooking plans   [name] has agreed to follow up with our clinic in 2 weeks. He was informed of the importance of frequent follow up  visits to maximize his success with intensive lifestyle modifications for his multiple health conditions.  ALLERGIES: Allergies  Allergen Reactions  . Tomato Other (See Comments)    GI distress    MEDICATIONS: Current Outpatient Medications on File Prior to Visit  Medication Sig Dispense  Refill  . buPROPion (WELLBUTRIN XL) 150 MG 24 hr tablet Take 150 mg by mouth daily.   12  . DULoxetine (CYMBALTA) 60 MG capsule Take 60 mg by mouth daily.     [name] esomeprazole (NEXIUM) 40 MG capsule TAKE 1 CAPSULE BY MOUTH ONCE DAILY 90 capsule 3  . etodolac (LODINE) 500 MG tablet TAKE 1 TABLET BY MOUTH TWICE DAILY AS NEEDED (MODERATE PAIN). 60 tablet 2  . HYDROcodone-acetaminophen (NORCO/VICODIN) 5-325 MG tablet Take 1 tablet by mouth every 6 (six) hours as needed for moderate pain. 15 tablet 0  . hydrocortisone (ANUSOL-HC) 25 MG suppository Place 1 suppository (25 mg total) rectally 2 (two) times daily as needed for hemorrhoids or anal itching. For 7 days (Patient not taking: Reported on 04/27/2019) 12 suppository 2  . ipratropium (ATROVENT) 0.06 % nasal spray Place 2 sprays into both nostrils 3 (three) times daily as needed for rhinitis. 15 mL 3  . Multiple Vitamin (MULTIVITAMIN) capsule Take 1 capsule by mouth daily.    . valsartan-hydrochlorothiazide (DIOVAN-HCT) 160-12.5 MG tablet TAKE 1 TABLET BY MOUTH EVERY MORNING. 90 tablet 3  . Vitamin D, Ergocalciferol, (DRISDOL) 1.25 MG (50000 UT) CAPS capsule Take 1 capsule (50,000 Units total) by mouth every 7 (seven) days. 4 capsule 0   No current facility-administered medications on file prior to visit.     PAST MEDICAL HISTORY: Past Medical History:  Diagnosis Date  . Chest pain   . Family history of adverse reaction to anesthesia    MOM-N/V, DAD-HAS PROBLEMS WITH NOVICAINE  . Food allergy   . GERD (gastroesophageal reflux disease)   . History of hiatal hernia   . History of kidney stones    H/O  . Insomnia   . Joint pain   . Plantar fasciitis   . Seasonal allergies   . Tendonitis of foot    left    PAST SURGICAL HISTORY: Past Surgical History:  Procedure Laterality Date  . CARPAL TUNNEL RELEASE Right 07/03/2017   Procedure: CARPAL TUNNEL RELEASE;  Surgeon: Hessie Knows, MD;  Location: [facility];  Service: Orthopedics;   Laterality: Right;  . clavide surgery  2005  . CYSTOSCOPY/URETEROSCOPY/HOLMIUM LASER/STENT PLACEMENT Right 05/22/2018   Procedure: CYSTOSCOPY/URETEROSCOPY/HOLMIUM LASER/STENT PLACEMENT;  Surgeon: Billey Co, MD;  Location: [facility];  Service: Urology;  Laterality: Right;  . KNEE SURGERY Right 2013  . SHOULDER SURGERY Right 2008    SOCIAL HISTORY: Social History   Tobacco Use  . Smoking status: Never Smoker  . Smokeless tobacco: Never Used  Substance Use Topics  . Alcohol use: Yes    Alcohol/week: 0.0 standard drinks    Comment: RARE  . Drug use: No    FAMILY HISTORY: Family History  Problem Relation Age of Onset  . Cancer Mother        colon/rectal  . Hyperlipidemia Mother   . Cancer Brother        hodgekins  . Cancer Maternal Grandfather        lung  . Stroke Maternal Grandfather   . Diabetes Father   . Hypertension Father   . Hyperlipidemia Father   . Heart disease Father   . Alcoholism Father     ROS: Review of  Systems  Constitutional: Negative for weight loss.  Gastrointestinal: Negative for diarrhea, nausea and vomiting.  Musculoskeletal:       Negative muscle weakness    PHYSICAL EXAM: Pt in no acute distress  RECENT LABS AND TESTS: BMET    Component Value Date/Time   NA 142 06/22/2018 0816   NA 138 01/28/2018 1007   NA 138 05/20/2013 0815   K 4.3 06/22/2018 0816   K 3.7 05/20/2013 0815   CL 105 06/22/2018 0816   CL 106 05/20/2013 0815   CO2 28 06/22/2018 0816   CO2 26 05/20/2013 0815   GLUCOSE 112 (H) 06/22/2018 0816   GLUCOSE 101 (H) 05/20/2013 0815   BUN 18 06/22/2018 0816   BUN 18 01/28/2018 1007   BUN 20 (H) 05/20/2013 0815   CREATININE 0.99 06/22/2018 0816   CALCIUM 9.4 06/22/2018 0816   CALCIUM 8.9 05/20/2013 0815   GFRNONAA 92 06/22/2018 0816   GFRAA 107 06/22/2018 0816   Lab Results  Component Value Date   HGBA1C 6.3 (H) 06/22/2018   HGBA1C 6.2 (H) 01/28/2018   HGBA1C 6.7 11/07/2017   HGBA1C 6.3 05/08/2017   HGBA1C 6.5  11/06/2016   Lab Results  Component Value Date   INSULIN 11.6 05/13/2018   INSULIN 10.5 01/28/2018   CBC    Component Value Date/Time   WBC 6.2 06/22/2018 0816   RBC 4.98 06/22/2018 0816   HGB 13.9 06/22/2018 0816   HGB 14.6 01/28/2018 1007   HCT 41.1 06/22/2018 0816   HCT 43.5 01/28/2018 1007   PLT 249 06/22/2018 0816   PLT 250 06/23/2017 1104   MCV 82.5 06/22/2018 0816   MCV 84 01/28/2018 1007   MCV 84 11/05/2011 1606   MCH 27.9 06/22/2018 0816   MCHC 33.8 06/22/2018 0816   RDW 13.2 06/22/2018 0816   RDW 14.3 01/28/2018 1007   RDW 13.0 11/05/2011 1606   LYMPHSABS 1,358 06/22/2018 0816   LYMPHSABS 1.5 01/28/2018 1007   LYMPHSABS 1.8 11/05/2011 1606   MONOABS 0.9 04/12/2018 1820   MONOABS 0.6 11/05/2011 1606   EOSABS 360 06/22/2018 0816   EOSABS 0.2 01/28/2018 1007   EOSABS 0.1 11/05/2011 1606   BASOSABS 50 06/22/2018 0816   BASOSABS 0.0 01/28/2018 1007   BASOSABS 0.0 11/05/2011 1606   Iron/TIBC/Ferritin/ %Sat No results found for: IRON, TIBC, FERRITIN, IRONPCTSAT Lipid Panel     Component Value Date/Time   CHOL 182 06/22/2018 0816   CHOL 175 05/13/2018 0845   TRIG 82 06/22/2018 0816   HDL 65 06/22/2018 0816   HDL 51 05/13/2018 0845   CHOLHDL 2.8 06/22/2018 0816   LDLCALC 100 (H) 06/22/2018 0816   Hepatic Function Panel     Component Value Date/Time   PROT 6.6 06/22/2018 0816   PROT 7.0 01/28/2018 1007   PROT 8.5 (H) 11/05/2011 1606   ALBUMIN 4.6 04/12/2018 1820   ALBUMIN 4.4 01/28/2018 1007   ALBUMIN 4.5 11/05/2011 1606   AST 17 06/22/2018 0816   AST 36 11/05/2011 1606   ALT 18 06/22/2018 0816   ALT 53 11/05/2011 1606   ALKPHOS 63 04/12/2018 1820   ALKPHOS 147 (H) 11/05/2011 1606   BILITOT 0.3 06/22/2018 0816   BILITOT 0.3 01/28/2018 1007   BILITOT 0.5 11/05/2011 1606      Component Value Date/Time   TSH 1.330 01/28/2018 1007   TSH 1.440 01/04/2016 0818      I, Trixie Dredge, am acting as transcriptionist for Abby Potash, PA-C I,  Abby Potash, PA-C have  reviewed above note and agree with its content

## 2019-05-06 ENCOUNTER — Encounter (INDEPENDENT_AMBULATORY_CARE_PROVIDER_SITE_OTHER): Payer: Self-pay

## 2019-05-06 MED ORDER — ETODOLAC 500 MG PO TABS: ORAL_TABLET | ORAL | 2 refills | Status: DC

## 2019-05-08 ENCOUNTER — Encounter (INDEPENDENT_AMBULATORY_CARE_PROVIDER_SITE_OTHER): Payer: Self-pay

## 2019-05-09 ENCOUNTER — Encounter (INDEPENDENT_AMBULATORY_CARE_PROVIDER_SITE_OTHER): Payer: Self-pay

## 2019-05-10 ENCOUNTER — Encounter (INDEPENDENT_AMBULATORY_CARE_PROVIDER_SITE_OTHER): Payer: Self-pay

## 2019-05-13 ENCOUNTER — Encounter (INDEPENDENT_AMBULATORY_CARE_PROVIDER_SITE_OTHER): Payer: Self-pay

## 2019-05-17 ENCOUNTER — Telehealth (INDEPENDENT_AMBULATORY_CARE_PROVIDER_SITE_OTHER): Payer: 59 | Admitting: Physician Assistant

## 2019-05-17 ENCOUNTER — Encounter (INDEPENDENT_AMBULATORY_CARE_PROVIDER_SITE_OTHER): Payer: Self-pay | Admitting: Physician Assistant

## 2019-05-17 ENCOUNTER — Other Ambulatory Visit: Payer: Self-pay

## 2019-05-17 DIAGNOSIS — E119 Type 2 diabetes mellitus without complications: Secondary | ICD-10-CM

## 2019-05-17 DIAGNOSIS — E559 Vitamin D deficiency, unspecified: Secondary | ICD-10-CM

## 2019-05-17 DIAGNOSIS — Z6833 Body mass index (BMI) 33.0-33.9, adult: Secondary | ICD-10-CM

## 2019-05-17 DIAGNOSIS — E669 Obesity, unspecified: Secondary | ICD-10-CM | POA: Diagnosis not present

## 2019-05-17 MED ORDER — VITAMIN D (ERGOCALCIFEROL) 1.25 MG (50000 UNIT) PO CAPS: 50000.0000 [IU] | ORAL_CAPSULE | ORAL | 0 refills | Status: DC

## 2019-05-18 NOTE — Progress Notes (Signed)
Office: [phone]  /  Fax: [phone] TeleHealth Visit:  [name] has verbally consented to this TeleHealth visit today. The patient is located at work, the provider is located at the [employer] [facility] office. The participants in this visit include the listed provider and patient and any and all parties involved. The visit was conducted today via telephone. [name] was unable to use realtime audiovisual technology today and the telehealth visit was conducted via telephone.  HPI:   Chief Complaint: OBESITY [name] is here to discuss his progress with his obesity treatment plan. He is on the Category 4 plan and is following his eating plan approximately 50 % of the time. He states he is exercising 0 minutes 0 times per week. [name]'s most recent weight was at 257 pounds (05/17/19). He is recovering from [place], and he is very fatigued, and he has no sense of taste or smell. He has not been eating on plan.  We were unable to weigh the patient today for this TeleHealth visit. He feels as if he has gained weight since his last visit. He has gained 2 lbs since starting treatment with [name].  Vitamin D deficiency [name] has a diagnosis of vitamin D deficiency. He is currently taking vit D and denies nausea, vomiting or muscle weakness.  Diabetes II [name] has a diagnosis of diabetes type II. [name] has not been checking his blood sugar regularly, though this morning it was 102. [name] denies any hypoglycemic episodes. He has been working on intensive lifestyle modifications including diet, exercise, and weight loss to help control his blood glucose levels.  ASSESSMENT AND PLAN:  Type 2 diabetes mellitus without complication, without long-term current use of insulin (HCC)  Vitamin D deficiency - Plan: Vitamin D, Ergocalciferol, (DRISDOL) 1.25 MG (50000 UT) CAPS capsule  Class 1 obesity with serious comorbidity and body mass index (BMI) of 33.0 to 33.9 in adult, unspecified obesity  type  PLAN:  Vitamin D Deficiency Reason was informed that low vitamin D levels contributes to fatigue and are associated with obesity, breast, and colon cancer. He agrees to continue to take prescription Vit D @50 ,000 IU every week #4 with no refills and will follow up for routine testing of vitamin D, at least 2-3 times per year. He was informed of the risk of over-replacement of vitamin D and agrees to not increase his dose unless he discusses this with [name] first. Rhea agrees to follow up with our clinic in 2 weeks.  Diabetes II [name] has been given extensive diabetes education by myself today including ideal fasting and post-prandial blood glucose readings, individual ideal Hgb A1c goals and hypoglycemia prevention. We discussed the importance of good blood sugar control to decrease the likelihood of diabetic complications such as nephropathy, neuropathy, limb loss, blindness, coronary artery disease, and death. We discussed the importance of intensive lifestyle modification including diet, exercise and weight loss as the first line treatment for diabetes. Righteous agrees to continue his diabetes medications and weight loss, and he will follow up at the agreed upon time.  Obesity [name] is currently in the action stage of change. As such, his goal is to continue with weight loss efforts He has agreed to keep a food journal with 1800 calories and 100 grams of protein daily [name] has been instructed to work up to a goal of 150 minutes of combined cardio and strengthening exercise per week for weight loss and overall health benefits. We discussed the following Behavioral Modification Strategies today:  increasing lean protein intake and work on meal planning and easy cooking plans  [name] has agreed to follow up with our clinic in 2 weeks. He was informed of the importance of frequent follow up visits to maximize his success with intensive lifestyle modifications for his multiple health  conditions.  ALLERGIES: Allergies  Allergen Reactions   Tomato Other (See Comments)    GI distress    MEDICATIONS: Current Outpatient Medications on File Prior to Visit  Medication Sig Dispense Refill   buPROPion (WELLBUTRIN XL) 150 MG 24 hr tablet Take 150 mg by mouth daily.   12   DULoxetine (CYMBALTA) 60 MG capsule Take 60 mg by mouth daily.      esomeprazole (NEXIUM) 40 MG capsule TAKE 1 CAPSULE BY MOUTH ONCE DAILY 90 capsule 3   etodolac (LODINE) 500 MG tablet TAKE 1 TABLET BY MOUTH TWICE DAILY AS NEEDED (MODERATE PAIN). 60 tablet 2   HYDROcodone-acetaminophen (NORCO/VICODIN) 5-325 MG tablet Take 1 tablet by mouth every 6 (six) hours as needed for moderate pain. 15 tablet 0   hydrocortisone (ANUSOL-HC) 25 MG suppository Place 1 suppository (25 mg total) rectally 2 (two) times daily as needed for hemorrhoids or anal itching. For 7 days (Patient not taking: Reported on 04/27/2019) 12 suppository 2   ipratropium (ATROVENT) 0.06 % nasal spray Place 2 sprays into both nostrils 3 (three) times daily as needed for rhinitis. 15 mL 3   metFORMIN (GLUCOPHAGE) 1000 MG tablet Take 1 tablet (1,000 mg total) by mouth 2 (two) times daily with a meal. 60 tablet 0   Multiple Vitamin (MULTIVITAMIN) capsule Take 1 capsule by mouth daily.     valsartan-hydrochlorothiazide (DIOVAN-HCT) 160-12.5 MG tablet TAKE 1 TABLET BY MOUTH EVERY MORNING. 90 tablet 3   No current facility-administered medications on file prior to visit.     PAST MEDICAL HISTORY: Past Medical History:  Diagnosis Date   Chest pain    Family history of adverse reaction to anesthesia    MOM-N/V, DAD-HAS PROBLEMS WITH [place] allergy    GERD (gastroesophageal reflux disease)    History of hiatal hernia    History of kidney stones    H/O   Insomnia    Joint pain    Plantar fasciitis    Seasonal allergies    Tendonitis of foot    left    PAST SURGICAL HISTORY: Past Surgical History:  Procedure  Laterality Date   CARPAL TUNNEL RELEASE Right 07/03/2017   Procedure: CARPAL TUNNEL RELEASE;  Surgeon: Hessie Knows, MD;  Location: [facility];  Service: Orthopedics;  Laterality: Right;   clavide surgery  2005   CYSTOSCOPY/URETEROSCOPY/HOLMIUM LASER/STENT PLACEMENT Right 05/22/2018   Procedure: CYSTOSCOPY/URETEROSCOPY/HOLMIUM LASER/STENT PLACEMENT;  Surgeon: Billey Co, MD;  Location: [facility];  Service: Urology;  Laterality: Right;   KNEE SURGERY Right 2013   SHOULDER SURGERY Right 2008    SOCIAL HISTORY: Social History   Tobacco Use   Smoking status: Never Smoker   Smokeless tobacco: Never Used  Substance Use Topics   Alcohol use: Yes    Alcohol/week: 0.0 standard drinks    Comment: RARE   Drug use: No    FAMILY HISTORY: Family History  Problem Relation Age of Onset   Cancer Mother        colon/rectal   Hyperlipidemia Mother    Cancer Brother        hodgekins   Cancer Maternal Grandfather        lung   Stroke Maternal  Grandfather    Diabetes Father    Hypertension Father    Hyperlipidemia Father    Heart disease Father    Alcoholism Father     ROS: Review of Systems  Constitutional: Negative for weight loss.  Gastrointestinal: Negative for nausea and vomiting.  Musculoskeletal:       Negative for muscle weakness  Endo/Heme/Allergies:       Negative for hypoglycemia    PHYSICAL EXAM: Pt in no acute distress  RECENT LABS AND TESTS: BMET    Component Value Date/Time   NA 142 06/22/2018 0816   NA 138 01/28/2018 1007   NA 138 05/20/2013 0815   K 4.3 06/22/2018 0816   K 3.7 05/20/2013 0815   CL 105 06/22/2018 0816   CL 106 05/20/2013 0815   CO2 28 06/22/2018 0816   CO2 26 05/20/2013 0815   GLUCOSE 112 (H) 06/22/2018 0816   GLUCOSE 101 (H) 05/20/2013 0815   BUN 18 06/22/2018 0816   BUN 18 01/28/2018 1007   BUN 20 (H) 05/20/2013 0815   CREATININE 0.99 06/22/2018 0816   CALCIUM 9.4 06/22/2018 0816   CALCIUM 8.9 05/20/2013 0815    GFRNONAA 92 06/22/2018 0816   GFRAA 107 06/22/2018 0816   Lab Results  Component Value Date   HGBA1C 6.3 (H) 06/22/2018   HGBA1C 6.2 (H) 01/28/2018   HGBA1C 6.7 11/07/2017   HGBA1C 6.3 05/08/2017   HGBA1C 6.5 11/06/2016   Lab Results  Component Value Date   INSULIN 11.6 05/13/2018   INSULIN 10.5 01/28/2018   CBC    Component Value Date/Time   WBC 6.2 06/22/2018 0816   RBC 4.98 06/22/2018 0816   HGB 13.9 06/22/2018 0816   HGB 14.6 01/28/2018 1007   HCT 41.1 06/22/2018 0816   HCT 43.5 01/28/2018 1007   PLT 249 06/22/2018 0816   PLT 250 06/23/2017 1104   MCV 82.5 06/22/2018 0816   MCV 84 01/28/2018 1007   MCV 84 11/05/2011 1606   MCH 27.9 06/22/2018 0816   MCHC 33.8 06/22/2018 0816   RDW 13.2 06/22/2018 0816   RDW 14.3 01/28/2018 1007   RDW 13.0 11/05/2011 1606   LYMPHSABS 1,358 06/22/2018 0816   LYMPHSABS 1.5 01/28/2018 1007   LYMPHSABS 1.8 11/05/2011 1606   MONOABS 0.9 04/12/2018 1820   MONOABS 0.6 11/05/2011 1606   EOSABS 360 06/22/2018 0816   EOSABS 0.2 01/28/2018 1007   EOSABS 0.1 11/05/2011 1606   BASOSABS 50 06/22/2018 0816   BASOSABS 0.0 01/28/2018 1007   BASOSABS 0.0 11/05/2011 1606   Iron/TIBC/Ferritin/ %Sat No results found for: IRON, TIBC, FERRITIN, IRONPCTSAT Lipid Panel     Component Value Date/Time   CHOL 182 06/22/2018 0816   CHOL 175 05/13/2018 0845   TRIG 82 06/22/2018 0816   HDL 65 06/22/2018 0816   HDL 51 05/13/2018 0845   CHOLHDL 2.8 06/22/2018 0816   LDLCALC 100 (H) 06/22/2018 0816   Hepatic Function Panel     Component Value Date/Time   PROT 6.6 06/22/2018 0816   PROT 7.0 01/28/2018 1007   PROT 8.5 (H) 11/05/2011 1606   ALBUMIN 4.6 04/12/2018 1820   ALBUMIN 4.4 01/28/2018 1007   ALBUMIN 4.5 11/05/2011 1606   AST 17 06/22/2018 0816   AST 36 11/05/2011 1606   ALT 18 06/22/2018 0816   ALT 53 11/05/2011 1606   ALKPHOS 63 04/12/2018 1820   ALKPHOS 147 (H) 11/05/2011 1606   BILITOT 0.3 06/22/2018 0816   BILITOT 0.3  01/28/2018 1007  BILITOT 0.5 11/05/2011 1606      Component Value Date/Time   TSH 1.330 01/28/2018 1007   TSH 1.440 01/04/2016 0818     Ref. Range 05/13/2018 08:45  Vitamin D, 25-Hydroxy Latest Ref Range: 30.0 - 100.0 ng/mL 37.8    I, Doreene Nest, am acting as Location manager for Abby Potash, PA-C I, Abby Potash, PA-C have reviewed above note and agree with its content

## 2019-05-19 DIAGNOSIS — M7662 Achilles tendinitis, left leg: Secondary | ICD-10-CM | POA: Diagnosis not present

## 2019-05-19 DIAGNOSIS — M79672 Pain in left foot: Secondary | ICD-10-CM | POA: Diagnosis not present

## 2019-05-19 DIAGNOSIS — B070 Plantar wart: Secondary | ICD-10-CM | POA: Diagnosis not present

## 2019-05-19 DIAGNOSIS — G8929 Other chronic pain: Secondary | ICD-10-CM | POA: Diagnosis not present

## 2019-05-25 DIAGNOSIS — I10 Essential (primary) hypertension: Secondary | ICD-10-CM | POA: Diagnosis not present

## 2019-05-25 DIAGNOSIS — E119 Type 2 diabetes mellitus without complications: Secondary | ICD-10-CM | POA: Diagnosis not present

## 2019-05-31 ENCOUNTER — Other Ambulatory Visit: Payer: Self-pay

## 2019-05-31 ENCOUNTER — Encounter (INDEPENDENT_AMBULATORY_CARE_PROVIDER_SITE_OTHER): Payer: Self-pay | Admitting: Physician Assistant

## 2019-05-31 ENCOUNTER — Ambulatory Visit (INDEPENDENT_AMBULATORY_CARE_PROVIDER_SITE_OTHER): Payer: 59 | Admitting: Physician Assistant

## 2019-05-31 DIAGNOSIS — E669 Obesity, unspecified: Secondary | ICD-10-CM | POA: Diagnosis not present

## 2019-05-31 DIAGNOSIS — E559 Vitamin D deficiency, unspecified: Secondary | ICD-10-CM

## 2019-05-31 DIAGNOSIS — Z6833 Body mass index (BMI) 33.0-33.9, adult: Secondary | ICD-10-CM

## 2019-06-01 DIAGNOSIS — I10 Essential (primary) hypertension: Secondary | ICD-10-CM | POA: Diagnosis not present

## 2019-06-01 DIAGNOSIS — E119 Type 2 diabetes mellitus without complications: Secondary | ICD-10-CM | POA: Diagnosis not present

## 2019-06-01 NOTE — Progress Notes (Signed)
Office: [phone]  /  Fax: [phone] TeleHealth Visit:  [name] has verbally consented to this TeleHealth visit today. The patient is located at work, the provider is located at the [employer] [facility] office. The participants in this visit include the listed provider and patient and any and all parties involved. The visit was conducted today via telephone. [name] was unable to use realtime audiovisual technology today and the telehealth visit was conducted via telephone.  HPI:   Chief Complaint: OBESITY [name] is here to discuss his progress with his obesity treatment plan. He is on the Category 4 plan and is following his eating plan approximately 80 % of the time. He states he is exercising 0 minutes 0 times per week. [name]'s most recent weight was 253 pounds (05/31/19). Patient reports that he has done a better job staying on the plan. He got all of the "junk" out of his house. We were unable to weigh the patient today for this TeleHealth visit. He feels as if he has lost weight since his last visit. He has lost 0 lbs since starting treatment with [name].  Vitamin D deficiency [name] has a diagnosis of vitamin D deficiency. He is currently taking vit D and denies nausea, vomiting or muscle weakness.  ASSESSMENT AND PLAN:  Vitamin D deficiency  Class 1 obesity with serious comorbidity and body mass index (BMI) of 33.0 to 33.9 in adult, unspecified obesity type  PLAN:  Vitamin D Deficiency [name] was informed that low vitamin D levels contributes to fatigue and are associated with obesity, breast, and colon cancer. He agrees to continue to take prescription Vit D @50 ,000 IU every week and will follow up for routine testing of vitamin D, at least 2-3 times per year. He was informed of the risk of over-replacement of vitamin D and agrees to not increase his dose unless he discusses this with [name] first.  Obesity [name] is currently in the action stage of change. As  such, his goal is to continue with weight loss efforts He has agreed to follow the Category 4 plan [name] has been instructed to work up to a goal of 150 minutes of combined cardio and strengthening exercise per week for weight loss and overall health benefits. We discussed the following Behavioral Modification Strategies today: keeping healthy foods in the home and work on meal planning and easy cooking plans  [name] has agreed to follow up with our clinic in 2 weeks. He was informed of the importance of frequent follow up visits to maximize his success with intensive lifestyle modifications for his multiple health conditions.  ALLERGIES: Allergies  Allergen Reactions   Tomato Other (See Comments)    GI distress    MEDICATIONS: Current Outpatient Medications on File Prior to Visit  Medication Sig Dispense Refill   buPROPion (WELLBUTRIN XL) 150 MG 24 hr tablet Take 150 mg by mouth daily.   12   DULoxetine (CYMBALTA) 60 MG capsule Take 60 mg by mouth daily.      esomeprazole (NEXIUM) 40 MG capsule TAKE 1 CAPSULE BY MOUTH ONCE DAILY 90 capsule 3   etodolac (LODINE) 500 MG tablet TAKE 1 TABLET BY MOUTH TWICE DAILY AS NEEDED (MODERATE PAIN). 60 tablet 2   HYDROcodone-acetaminophen (NORCO/VICODIN) 5-325 MG tablet Take 1 tablet by mouth every 6 (six) hours as needed for moderate pain. 15 tablet 0   hydrocortisone (ANUSOL-HC) 25 MG suppository Place 1 suppository (25 mg total) rectally 2 (two) times daily as needed for  hemorrhoids or anal itching. For 7 days (Patient not taking: Reported on 04/27/2019) 12 suppository 2   ipratropium (ATROVENT) 0.06 % nasal spray Place 2 sprays into both nostrils 3 (three) times daily as needed for rhinitis. 15 mL 3   metFORMIN (GLUCOPHAGE) 1000 MG tablet Take 1 tablet (1,000 mg total) by mouth 2 (two) times daily with a meal. 60 tablet 0   Multiple Vitamin (MULTIVITAMIN) capsule Take 1 capsule by mouth daily.     valsartan-hydrochlorothiazide  (DIOVAN-HCT) 160-12.5 MG tablet TAKE 1 TABLET BY MOUTH EVERY MORNING. 90 tablet 3   Vitamin D, Ergocalciferol, (DRISDOL) 1.25 MG (50000 UT) CAPS capsule Take 1 capsule (50,000 Units total) by mouth every 7 (seven) days. 4 capsule 0   No current facility-administered medications on file prior to visit.     PAST MEDICAL HISTORY: Past Medical History:  Diagnosis Date   Chest pain    Family history of adverse reaction to anesthesia    MOM-N/V, DAD-HAS PROBLEMS WITH [employer] allergy    GERD (gastroesophageal reflux disease)    History of hiatal hernia    History of kidney stones    H/O   Insomnia    Joint pain    Plantar fasciitis    Seasonal allergies    Tendonitis of foot    left    PAST SURGICAL HISTORY: Past Surgical History:  Procedure Laterality Date   CARPAL TUNNEL RELEASE Right 07/03/2017   Procedure: CARPAL TUNNEL RELEASE;  Surgeon: Hessie Knows, MD;  Location: [facility];  Service: Orthopedics;  Laterality: Right;   clavide surgery  2005   CYSTOSCOPY/URETEROSCOPY/HOLMIUM LASER/STENT PLACEMENT Right 05/22/2018   Procedure: CYSTOSCOPY/URETEROSCOPY/HOLMIUM LASER/STENT PLACEMENT;  Surgeon: Billey Co, MD;  Location: [facility];  Service: Urology;  Laterality: Right;   KNEE SURGERY Right 2013   SHOULDER SURGERY Right 2008    SOCIAL HISTORY: Social History   Tobacco Use   Smoking status: Never Smoker   Smokeless tobacco: Never Used  Substance Use Topics   Alcohol use: Yes    Alcohol/week: 0.0 standard drinks    Comment: RARE   Drug use: No    FAMILY HISTORY: Family History  Problem Relation Age of Onset   Cancer Mother        colon/rectal   Hyperlipidemia Mother    Cancer Brother        hodgekins   Cancer Maternal Grandfather        lung   Stroke Maternal Grandfather    Diabetes Father    Hypertension Father    Hyperlipidemia Father    Heart disease Father    Alcoholism Father     ROS: Review of Systems    Constitutional: Positive for weight loss.  Gastrointestinal: Negative for nausea and vomiting.  Musculoskeletal:       Negative for muscle weakness    PHYSICAL EXAM: Pt in no acute distress  RECENT LABS AND TESTS: BMET    Component Value Date/Time   NA 142 06/22/2018 0816   NA 138 01/28/2018 1007   NA 138 05/20/2013 0815   K 4.3 06/22/2018 0816   K 3.7 05/20/2013 0815   CL 105 06/22/2018 0816   CL 106 05/20/2013 0815   CO2 28 06/22/2018 0816   CO2 26 05/20/2013 0815   GLUCOSE 112 (H) 06/22/2018 0816   GLUCOSE 101 (H) 05/20/2013 0815   BUN 18 06/22/2018 0816   BUN 18 01/28/2018 1007   BUN 20 (H) 05/20/2013 0815   CREATININE 0.99 06/22/2018  0816   CALCIUM 9.4 06/22/2018 0816   CALCIUM 8.9 05/20/2013 0815   GFRNONAA 92 06/22/2018 0816   GFRAA 107 06/22/2018 0816   Lab Results  Component Value Date   HGBA1C 6.3 (H) 06/22/2018   HGBA1C 6.2 (H) 01/28/2018   HGBA1C 6.7 11/07/2017   HGBA1C 6.3 05/08/2017   HGBA1C 6.5 11/06/2016   Lab Results  Component Value Date   INSULIN 11.6 05/13/2018   INSULIN 10.5 01/28/2018   CBC    Component Value Date/Time   WBC 6.2 06/22/2018 0816   RBC 4.98 06/22/2018 0816   HGB 13.9 06/22/2018 0816   HGB 14.6 01/28/2018 1007   HCT 41.1 06/22/2018 0816   HCT 43.5 01/28/2018 1007   PLT 249 06/22/2018 0816   PLT 250 06/23/2017 1104   MCV 82.5 06/22/2018 0816   MCV 84 01/28/2018 1007   MCV 84 11/05/2011 1606   MCH 27.9 06/22/2018 0816   MCHC 33.8 06/22/2018 0816   RDW 13.2 06/22/2018 0816   RDW 14.3 01/28/2018 1007   RDW 13.0 11/05/2011 1606   LYMPHSABS 1,358 06/22/2018 0816   LYMPHSABS 1.5 01/28/2018 1007   LYMPHSABS 1.8 11/05/2011 1606   MONOABS 0.9 04/12/2018 1820   MONOABS 0.6 11/05/2011 1606   EOSABS 360 06/22/2018 0816   EOSABS 0.2 01/28/2018 1007   EOSABS 0.1 11/05/2011 1606   BASOSABS 50 06/22/2018 0816   BASOSABS 0.0 01/28/2018 1007   BASOSABS 0.0 11/05/2011 1606   Iron/TIBC/Ferritin/ %Sat No results found for:  IRON, TIBC, FERRITIN, IRONPCTSAT Lipid Panel     Component Value Date/Time   CHOL 182 06/22/2018 0816   CHOL 175 05/13/2018 0845   TRIG 82 06/22/2018 0816   HDL 65 06/22/2018 0816   HDL 51 05/13/2018 0845   CHOLHDL 2.8 06/22/2018 0816   LDLCALC 100 (H) 06/22/2018 0816   Hepatic Function Panel     Component Value Date/Time   PROT 6.6 06/22/2018 0816   PROT 7.0 01/28/2018 1007   PROT 8.5 (H) 11/05/2011 1606   ALBUMIN 4.6 04/12/2018 1820   ALBUMIN 4.4 01/28/2018 1007   ALBUMIN 4.5 11/05/2011 1606   AST 17 06/22/2018 0816   AST 36 11/05/2011 1606   ALT 18 06/22/2018 0816   ALT 53 11/05/2011 1606   ALKPHOS 63 04/12/2018 1820   ALKPHOS 147 (H) 11/05/2011 1606   BILITOT 0.3 06/22/2018 0816   BILITOT 0.3 01/28/2018 1007   BILITOT 0.5 11/05/2011 1606      Component Value Date/Time   TSH 1.330 01/28/2018 1007   TSH 1.440 01/04/2016 0818     Ref. Range 05/13/2018 08:45  Vitamin D, 25-Hydroxy Latest Ref Range: 30.0 - 100.0 ng/mL 37.8    I, Doreene Nest, am acting as Location manager for Abby Potash, PA-C I, Abby Potash, PA-C have reviewed above note and agree with its content

## 2019-06-04 ENCOUNTER — Ambulatory Visit: Payer: 59 | Admitting: Urology

## 2019-06-09 ENCOUNTER — Ambulatory Visit: Payer: 59 | Admitting: Urology

## 2019-06-15 ENCOUNTER — Ambulatory Visit (INDEPENDENT_AMBULATORY_CARE_PROVIDER_SITE_OTHER): Payer: 59 | Admitting: Physician Assistant

## 2019-06-15 ENCOUNTER — Other Ambulatory Visit: Payer: Self-pay

## 2019-06-15 VITALS — BP 140/9 | HR 74 | Temp 97.9°F | Ht 73.0 in | Wt 255.0 lb

## 2019-06-15 DIAGNOSIS — R5383 Other fatigue: Secondary | ICD-10-CM | POA: Diagnosis not present

## 2019-06-15 DIAGNOSIS — E669 Obesity, unspecified: Secondary | ICD-10-CM

## 2019-06-15 DIAGNOSIS — E7849 Other hyperlipidemia: Secondary | ICD-10-CM | POA: Diagnosis not present

## 2019-06-15 DIAGNOSIS — Z6833 Body mass index (BMI) 33.0-33.9, adult: Secondary | ICD-10-CM | POA: Diagnosis not present

## 2019-06-15 DIAGNOSIS — E559 Vitamin D deficiency, unspecified: Secondary | ICD-10-CM | POA: Diagnosis not present

## 2019-06-15 DIAGNOSIS — E119 Type 2 diabetes mellitus without complications: Secondary | ICD-10-CM

## 2019-06-15 DIAGNOSIS — Z9189 Other specified personal risk factors, not elsewhere classified: Secondary | ICD-10-CM

## 2019-06-15 MED ORDER — VITAMIN D (ERGOCALCIFEROL) 1.25 MG (50000 UNIT) PO CAPS: 50000.0000 [IU] | ORAL_CAPSULE | ORAL | 0 refills | Status: DC

## 2019-06-15 NOTE — Progress Notes (Signed)
Office: [phone]  /  Fax: [phone]   HPI:   Chief Complaint: OBESITY [name] is here to discuss his progress with his obesity treatment plan. Jon is on the Category 4 plan and is following his eating plan approximately 80% of the time. Jon states Jon is walking 20-30 minutes 2 times per week. [name] reports that his appetite has been decreased and Jon has not been getting enough protein. His weight is 255 lb (115.7 kg) today and has had a weight gain of 4 lbs since his last in-office visit 11/17/2018. Jon has lost 0 lbs since starting treatment with [name].  Vitamin D deficiency [name] has a diagnosis of Vitamin D deficiency. Jon is currently taking prescription Vit D and denies nausea, vomiting or muscle weakness.  Hyperlipidemia [name] has hyperlipidemia and has been trying to improve his cholesterol levels with intensive lifestyle modification including a low saturated fat diet, exercise and weight loss. Jon is on no medications and  denies any chest pain.  At risk for cardiovascular disease [name] is at a higher than average risk for cardiovascular disease due to obesity. Jon currently denies any chest pain.  Diabetes II [name] has a diagnosis of diabetes type II and is on metformin. [name] does not report checking blood sugars. Last A1c was 6.3 on 06/22/2018. Jon has been working on intensive lifestyle modifications including diet, exercise, and weight loss to help control his blood glucose levels. No nausea, vomiting, or diarrhea. No polyphagia.  Fatigue [name] has complaint of fatigue. Jon is not on B12 supplementation.  ASSESSMENT AND PLAN:  Vitamin D deficiency - Plan: VITAMIN D 25 Hydroxy (Vit-D Deficiency, Fractures), Vitamin D, Ergocalciferol, (DRISDOL) 1.25 MG (50000 UT) CAPS capsule  Other hyperlipidemia - Plan: Lipid Panel With LDL/HDL Ratio  Other fatigue - Plan: Vitamin B12  Type 2 diabetes mellitus without complication, without long-term current use of insulin (HCC) -  Plan: Hemoglobin A1c, Insulin, random, Comprehensive metabolic panel  At risk for heart disease  Class 1 obesity with serious comorbidity and body mass index (BMI) of 33.0 to 33.9 in adult, unspecified obesity type  PLAN:  Vitamin D Deficiency [name] was informed that low Vitamin D levels contributes to fatigue and are associated with obesity, breast, and colon cancer. Jon agrees to continue to take prescription Vit D @ 50,000 IU every week #4 with 0 refills and will follow-up for routine testing of Vitamin D, at least 2-3 times per year. Jon was informed of the risk of over-replacement of Vitamin D and agrees to not increase his dose unless Jon discusses this with [name] first. [name] agrees to follow-up with our clinic in 2 weeks.  Hyperlipidemia [name] was informed of the American Heart Association Guidelines emphasizing intensive lifestyle modifications as the first line treatment for hyperlipidemia. We discussed many lifestyle modifications today in depth, and [name] will continue to work on decreasing saturated fats such as fatty red meat, butter and many fried foods. [name] will have labs checked. Jon will also increase vegetables and lean protein in his diet and continue to work on exercise and weight loss efforts.  Cardiovascular risk counseling [name] was given extended (15 minutes) coronary artery disease prevention counseling today. Jon is 43 y.o. male and has risk factors for heart disease including obesity. We discussed intensive lifestyle modifications today with an emphasis on specific weight loss instructions and strategies. Pt was also informed of the importance of increasing exercise and decreasing saturated fats to help prevent heart disease.  Diabetes II [name]  has been given extensive diabetes education by myself today including ideal fasting and post-prandial blood glucose readings, individual ideal HgA1c goals  and hypoglycemia prevention. We discussed the importance of good blood  sugar control to decrease the likelihood of diabetic complications such as nephropathy, neuropathy, limb loss, blindness, coronary artery disease, and death. We discussed the importance of intensive lifestyle modification including diet, exercise and weight loss as the first line treatment for diabetes. [name] agrees to continue his diabetes medications, weight loss, and will follow-up at the agreed upon time.  Fatigue [name] was informed that his fatigue may be related to obesity, depression or many other causes. Labs will be ordered, and in the meanwhile [name] has agreed to work on diet, exercise and weight loss to help with fatigue. Proper sleep hygiene was discussed including the need for 7-8 hours of quality sleep each night. Arrow will have B12 level checked and follow-up as directed.  Obesity [name] is currently in the action stage of change. As such, his goal is to continue with weight loss efforts. Jon has agreed to follow the Category 4 plan. [name] has been instructed to work up to a goal of 150 minutes of combined cardio and strengthening exercise per week for weight loss and overall health benefits. We discussed the following Behavioral Modification Strategies today: work on meal planning and easy cooking plans, and better snacking choices.   [name] has agreed to follow-up with our clinic in 2 weeks. Jon was informed of the importance of frequent follow-up visits to maximize his success with intensive lifestyle modifications for his multiple health conditions.  ALLERGIES: Allergies  Allergen Reactions   Tomato Other (See Comments)    GI distress    MEDICATIONS: Current Outpatient Medications on File Prior to Visit  Medication Sig Dispense Refill   buPROPion (WELLBUTRIN XL) 150 MG 24 hr tablet Take 150 mg by mouth daily.   12   DULoxetine (CYMBALTA) 60 MG capsule Take 60 mg by mouth daily.      esomeprazole (NEXIUM) 40 MG capsule TAKE 1 CAPSULE BY MOUTH ONCE DAILY 90 capsule  3   etodolac (LODINE) 500 MG tablet TAKE 1 TABLET BY MOUTH TWICE DAILY AS NEEDED (MODERATE PAIN). 60 tablet 2   hydrocortisone (ANUSOL-HC) 25 MG suppository Place 1 suppository (25 mg total) rectally 2 (two) times daily as needed for hemorrhoids or anal itching. For 7 days 12 suppository 2   ipratropium (ATROVENT) 0.06 % nasal spray Place 2 sprays into both nostrils 3 (three) times daily as needed for rhinitis. 15 mL 3   metFORMIN (GLUCOPHAGE) 1000 MG tablet Take 1 tablet (1,000 mg total) by mouth 2 (two) times daily with a meal. 60 tablet 0   Multiple Vitamin (MULTIVITAMIN) capsule Take 1 capsule by mouth daily.     valsartan-hydrochlorothiazide (DIOVAN-HCT) 160-12.5 MG tablet TAKE 1 TABLET BY MOUTH EVERY MORNING. 90 tablet 3   HYDROcodone-acetaminophen (NORCO/VICODIN) 5-325 MG tablet Take 1 tablet by mouth every 6 (six) hours as needed for moderate pain. 15 tablet 0   No current facility-administered medications on file prior to visit.     PAST MEDICAL HISTORY: Past Medical History:  Diagnosis Date   Chest pain    Family history of adverse reaction to anesthesia    MOM-N/V, DAD-HAS PROBLEMS WITH [place] allergy    GERD (gastroesophageal reflux disease)    History of hiatal hernia    History of kidney stones    H/O   Insomnia    Joint  pain    Plantar fasciitis    Seasonal allergies    Tendonitis of foot    left    PAST SURGICAL HISTORY: Past Surgical History:  Procedure Laterality Date   CARPAL TUNNEL RELEASE Right 07/03/2017   Procedure: CARPAL TUNNEL RELEASE;  Surgeon: Hessie Knows, MD;  Location: [facility];  Service: Orthopedics;  Laterality: Right;   clavide surgery  2005   CYSTOSCOPY/URETEROSCOPY/HOLMIUM LASER/STENT PLACEMENT Right 05/22/2018   Procedure: CYSTOSCOPY/URETEROSCOPY/HOLMIUM LASER/STENT PLACEMENT;  Surgeon: Billey Co, MD;  Location: [facility];  Service: Urology;  Laterality: Right;   KNEE SURGERY Right 2013   SHOULDER  SURGERY Right 2008    SOCIAL HISTORY: Social History   Tobacco Use   Smoking status: Never Smoker   Smokeless tobacco: Never Used  Substance Use Topics   Alcohol use: Yes    Alcohol/week: 0.0 standard drinks    Comment: RARE   Drug use: No    FAMILY HISTORY: Family History  Problem Relation Age of Onset   Cancer Mother        colon/rectal   Hyperlipidemia Mother    Cancer Brother        hodgekins   Cancer Maternal Grandfather        lung   Stroke Maternal Grandfather    Diabetes Father    Hypertension Father    Hyperlipidemia Father    Heart disease Father    Alcoholism Father    ROS: Review of Systems  Constitutional: Positive for malaise/fatigue.  Cardiovascular: Negative for chest pain.  Gastrointestinal: Negative for diarrhea, nausea and vomiting.  Musculoskeletal:       Negative for muscle weakness.  Endo/Heme/Allergies:       Negative for polyphagia.   PHYSICAL EXAM: Blood pressure (!) 140/9, pulse 74, temperature 97.9 F (36.6 C), height 6\' 1"  (1.854 m), weight 255 lb (115.7 kg), SpO2 98 %. Body mass index is 33.64 kg/m. Physical Exam Vitals signs reviewed.  Constitutional:      Appearance: Normal appearance. [name].  Cardiovascular:     Rate and Rhythm: Normal rate.     Pulses: Normal pulses.  Pulmonary:     Effort: Pulmonary effort is normal.     Breath sounds: Normal breath sounds.  Musculoskeletal: Normal range of motion.  Skin:    General: Skin is warm and dry.  Neurological:     Mental Status: Jon is alert and oriented to person, place, and time.  Psychiatric:        Behavior: Behavior normal.   RECENT LABS AND TESTS: BMET    Component Value Date/Time   NA 142 06/22/2018 0816   NA 138 01/28/2018 1007   NA 138 05/20/2013 0815   K 4.3 06/22/2018 0816   K 3.7 05/20/2013 0815   CL 105 06/22/2018 0816   CL 106 05/20/2013 0815   CO2 28 06/22/2018 0816   CO2 26 05/20/2013 0815   GLUCOSE 112 (H) 06/22/2018 0816    GLUCOSE 101 (H) 05/20/2013 0815   BUN 18 06/22/2018 0816   BUN 18 01/28/2018 1007   BUN 20 (H) 05/20/2013 0815   CREATININE 0.99 06/22/2018 0816   CALCIUM 9.4 06/22/2018 0816   CALCIUM 8.9 05/20/2013 0815   GFRNONAA 92 06/22/2018 0816   GFRAA 107 06/22/2018 0816   Lab Results  Component Value Date   HGBA1C 6.3 (H) 06/22/2018   HGBA1C 6.2 (H) 01/28/2018   HGBA1C 6.7 11/07/2017   HGBA1C 6.3 05/08/2017   HGBA1C 6.5 11/06/2016   Lab  Results  Component Value Date   INSULIN 11.6 05/13/2018   INSULIN 10.5 01/28/2018   CBC    Component Value Date/Time   WBC 6.2 06/22/2018 0816   RBC 4.98 06/22/2018 0816   HGB 13.9 06/22/2018 0816   HGB 14.6 01/28/2018 1007   HCT 41.1 06/22/2018 0816   HCT 43.5 01/28/2018 1007   PLT 249 06/22/2018 0816   PLT 250 06/23/2017 1104   MCV 82.5 06/22/2018 0816   MCV 84 01/28/2018 1007   MCV 84 11/05/2011 1606   MCH 27.9 06/22/2018 0816   MCHC 33.8 06/22/2018 0816   RDW 13.2 06/22/2018 0816   RDW 14.3 01/28/2018 1007   RDW 13.0 11/05/2011 1606   LYMPHSABS 1,358 06/22/2018 0816   LYMPHSABS 1.5 01/28/2018 1007   LYMPHSABS 1.8 11/05/2011 1606   MONOABS 0.9 04/12/2018 1820   MONOABS 0.6 11/05/2011 1606   EOSABS 360 06/22/2018 0816   EOSABS 0.2 01/28/2018 1007   EOSABS 0.1 11/05/2011 1606   BASOSABS 50 06/22/2018 0816   BASOSABS 0.0 01/28/2018 1007   BASOSABS 0.0 11/05/2011 1606   Iron/TIBC/Ferritin/ %Sat No results found for: IRON, TIBC, FERRITIN, IRONPCTSAT Lipid Panel     Component Value Date/Time   CHOL 182 06/22/2018 0816   CHOL 175 05/13/2018 0845   TRIG 82 06/22/2018 0816   HDL 65 06/22/2018 0816   HDL 51 05/13/2018 0845   CHOLHDL 2.8 06/22/2018 0816   LDLCALC 100 (H) 06/22/2018 0816   Hepatic Function Panel     Component Value Date/Time   PROT 6.6 06/22/2018 0816   PROT 7.0 01/28/2018 1007   PROT 8.5 (H) 11/05/2011 1606   ALBUMIN 4.6 04/12/2018 1820   ALBUMIN 4.4 01/28/2018 1007   ALBUMIN 4.5 11/05/2011 1606   AST 17  06/22/2018 0816   AST 36 11/05/2011 1606   ALT 18 06/22/2018 0816   ALT 53 11/05/2011 1606   ALKPHOS 63 04/12/2018 1820   ALKPHOS 147 (H) 11/05/2011 1606   BILITOT 0.3 06/22/2018 0816   BILITOT 0.3 01/28/2018 1007   BILITOT 0.5 11/05/2011 1606      Component Value Date/Time   TSH 1.330 01/28/2018 1007   TSH 1.440 01/04/2016 0818   Results for Knoedler, Sorin C "NED" (MRN [id number]) as of 06/15/2019 10:58  Ref. Range 05/13/2018 08:45  Vitamin D, 25-Hydroxy Latest Ref Range: 30.0 - 100.0 ng/mL 37.8   OBESITY BEHAVIORAL INTERVENTION VISIT  Today's visit was #27  Starting weight: 249 lbs Starting date: 01/28/2018 Today's weight: 255 lbs  Today's date: 06/15/2019 Total lbs lost to date: 0    06/15/2019  Height 6\' 1"  (1.854 m)  Weight 255 lb (115.7 kg)  BMI (Calculated) 33.65  BLOOD PRESSURE - SYSTOLIC [id number]  BLOOD PRESSURE - DIASTOLIC 9   Body Fat % [id number] %  Total Body Water (lbs) 119.2 lbs   ASK: We discussed the diagnosis of obesity with [name] today and [name] agreed to give [name] permission to discuss obesity behavioral modification therapy today.  ASSESS: [name] has the diagnosis of obesity and his BMI today is 33.7. [name] is in the action stage of change.   ADVISE: [name] was educated on the multiple health risks of obesity as well as the benefit of weight loss to improve his health. Jon was advised of the need for long term treatment and the importance of lifestyle modifications to improve his current health and to decrease his risk of future health problems.  AGREE: Multiple dietary modification options and treatment options were  discussed and  [name] agreed to follow the recommendations documented in the above note.  ARRANGE: [name] was educated on the importance of frequent visits to treat obesity as outlined per CMS and USPSTF guidelines and agreed to schedule his next follow up appointment today.  Migdalia Dk, am acting as transcriptionist for Abby Potash, PA-C I, Abby Potash, PA-C have reviewed above note and agree with its content

## 2019-06-16 ENCOUNTER — Ambulatory Visit
Admission: RE | Admit: 2019-06-16 | Discharge: 2019-06-16 | Disposition: A | Discharge status: Home / Self Care | Payer: 59 | Source: Ambulatory Visit | Attending: Urology | Admitting: Urology

## 2019-06-16 ENCOUNTER — Ambulatory Visit (INDEPENDENT_AMBULATORY_CARE_PROVIDER_SITE_OTHER): Payer: 59 | Admitting: Urology

## 2019-06-16 ENCOUNTER — Other Ambulatory Visit: Payer: Self-pay | Admitting: Radiology

## 2019-06-16 ENCOUNTER — Encounter: Payer: Self-pay | Admitting: Urology

## 2019-06-16 VITALS — BP 173/115 | HR 91 | Ht 73.0 in | Wt 258.0 lb

## 2019-06-16 DIAGNOSIS — N201 Calculus of ureter: Secondary | ICD-10-CM | POA: Insufficient documentation

## 2019-06-16 DIAGNOSIS — N200 Calculus of kidney: Secondary | ICD-10-CM | POA: Diagnosis not present

## 2019-06-16 NOTE — Patient Instructions (Addendum)
Return in 18 months for KUB .

## 2019-06-16 NOTE — Progress Notes (Signed)
   06/16/2019 10:54 AM   Durwin Nora 1976-08-09 [id number]  Reason for visit: Follow up nephrolithiasis  HPI: I saw Mr. Mcelhone back in urology clinic today for follow-up of nephrolithiasis.  He is a 43 year old male that has had 2 prior episodes of nephrolithiasis, most recently in September 2019 for a right 51mm distal ureteral stone that required ureteroscopy, laser lithotripsy, and stent placement.  He has been doing well over the last year and denies any new stone episodes or gross hematuria.  KUB today is personally reviewed and shows no evidence of recurrent stones.  We discussed general stone prevention strategies including adequate hydration with goal of producing 2.5 L of urine daily, increasing citric acid intake, increasing calcium intake during high oxalate meals, minimizing animal protein, and decreasing salt intake. Information about dietary recommendations given today.   RTC 18 months for KUB  A total of 10 minutes were spent face-to-face with the patient, greater than 50% was spent in patient education, counseling, and coordination of care regarding nephrolithiasis, KUB, and stone prevention.  Billey Co, [facility] [address], [place], [place] [address] [phone]

## 2019-06-22 DIAGNOSIS — E559 Vitamin D deficiency, unspecified: Secondary | ICD-10-CM | POA: Diagnosis not present

## 2019-06-22 DIAGNOSIS — E119 Type 2 diabetes mellitus without complications: Secondary | ICD-10-CM | POA: Diagnosis not present

## 2019-06-22 DIAGNOSIS — R5383 Other fatigue: Secondary | ICD-10-CM | POA: Diagnosis not present

## 2019-06-22 DIAGNOSIS — E7849 Other hyperlipidemia: Secondary | ICD-10-CM | POA: Diagnosis not present

## 2019-06-23 LAB — LIPID PANEL WITH LDL/HDL RATIO
Cholesterol, Total: 176 mg/dL (ref 100–199)
HDL: 58 mg/dL (ref >39)
LDL Chol Calc (NIH): 100 mg/dL — ABNORMAL HIGH (ref 0–99)
LDL/HDL Ratio: 1.7 ratio (ref 0.0–3.6)
Triglycerides: 100 mg/dL (ref 0–149)
VLDL Cholesterol Cal: 18 mg/dL (ref 5–40)

## 2019-06-23 LAB — COMPREHENSIVE METABOLIC PANEL
ALT: 35 IU/L (ref 0–44)
AST: 27 IU/L (ref 0–40)
Albumin/Globulin Ratio: 2.3 — ABNORMAL HIGH (ref 1.2–2.2)
Albumin: 4.8 g/dL (ref 4.0–5.0)
Alkaline Phosphatase: 75 IU/L (ref 39–117)
BUN/Creatinine Ratio: 17 (ref 9–20)
BUN: 17 mg/dL (ref 6–24)
Bilirubin Total: 0.5 mg/dL (ref 0.0–1.2)
CO2: 23 mmol/L (ref 20–29)
Calcium: 9.8 mg/dL (ref 8.7–10.2)
Chloride: 102 mmol/L (ref 96–106)
Creatinine, Ser: 1.01 mg/dL (ref 0.76–1.27)
GFR calc Af Amer: 103 mL/min/{1.73_m2} (ref >59)
GFR calc non Af Amer: 89 mL/min/{1.73_m2} (ref >59)
Globulin, Total: 2.1 g/dL (ref 1.5–4.5)
Glucose: 141 mg/dL — ABNORMAL HIGH (ref 65–99)
Potassium: 4.3 mmol/L (ref 3.5–5.2)
Sodium: 140 mmol/L (ref 134–144)
Total Protein: 6.9 g/dL (ref 6.0–8.5)

## 2019-06-23 LAB — HEMOGLOBIN A1C
Est. average glucose Bld gHb Est-mCnc: 157 mg/dL
Hgb A1c MFr Bld: 7.1 % — ABNORMAL HIGH (ref 4.8–5.6)

## 2019-06-23 LAB — INSULIN, RANDOM: INSULIN: 17.6 u[IU]/mL (ref 2.6–24.9)

## 2019-06-23 LAB — VITAMIN B12: Vitamin B-12: 254 pg/mL (ref 232–1245)

## 2019-06-23 LAB — VITAMIN D 25 HYDROXY (VIT D DEFICIENCY, FRACTURES): Vit D, 25-Hydroxy: 38.6 ng/mL (ref 30.0–100.0)

## 2019-06-28 ENCOUNTER — Other Ambulatory Visit: Payer: 59

## 2019-06-28 ENCOUNTER — Encounter (INDEPENDENT_AMBULATORY_CARE_PROVIDER_SITE_OTHER): Payer: Self-pay | Admitting: Physician Assistant

## 2019-06-28 ENCOUNTER — Other Ambulatory Visit: Payer: Self-pay

## 2019-06-28 DIAGNOSIS — K219 Gastro-esophageal reflux disease without esophagitis: Secondary | ICD-10-CM

## 2019-06-28 DIAGNOSIS — E1169 Type 2 diabetes mellitus with other specified complication: Secondary | ICD-10-CM | POA: Diagnosis not present

## 2019-06-28 DIAGNOSIS — F3342 Major depressive disorder, recurrent, in full remission: Secondary | ICD-10-CM | POA: Diagnosis not present

## 2019-06-28 DIAGNOSIS — M150 Primary generalized (osteo)arthritis: Secondary | ICD-10-CM

## 2019-06-28 DIAGNOSIS — Z0000 Encounter for general adult medical examination without abnormal findings: Secondary | ICD-10-CM

## 2019-06-28 DIAGNOSIS — M8949 Other hypertrophic osteoarthropathy, multiple sites: Secondary | ICD-10-CM | POA: Diagnosis not present

## 2019-06-28 DIAGNOSIS — I10 Essential (primary) hypertension: Secondary | ICD-10-CM | POA: Diagnosis not present

## 2019-06-28 DIAGNOSIS — E785 Hyperlipidemia, unspecified: Secondary | ICD-10-CM | POA: Diagnosis not present

## 2019-06-28 DIAGNOSIS — M159 Polyosteoarthritis, unspecified: Secondary | ICD-10-CM

## 2019-06-29 ENCOUNTER — Telehealth (INDEPENDENT_AMBULATORY_CARE_PROVIDER_SITE_OTHER): Payer: 59 | Admitting: Physician Assistant

## 2019-06-29 ENCOUNTER — Encounter (INDEPENDENT_AMBULATORY_CARE_PROVIDER_SITE_OTHER): Payer: Self-pay | Admitting: Physician Assistant

## 2019-06-29 DIAGNOSIS — E669 Obesity, unspecified: Secondary | ICD-10-CM

## 2019-06-29 DIAGNOSIS — E559 Vitamin D deficiency, unspecified: Secondary | ICD-10-CM | POA: Diagnosis not present

## 2019-06-29 DIAGNOSIS — E119 Type 2 diabetes mellitus without complications: Secondary | ICD-10-CM

## 2019-06-29 DIAGNOSIS — Z6833 Body mass index (BMI) 33.0-33.9, adult: Secondary | ICD-10-CM | POA: Diagnosis not present

## 2019-06-29 LAB — COMPLETE METABOLIC PANEL WITH GFR
AG Ratio: 1.7 (calc) (ref 1.0–2.5)
ALT: 28 U/L (ref 9–46)
AST: 22 U/L (ref 10–40)
Albumin: 4.4 g/dL (ref 3.6–5.1)
Alkaline phosphatase (APISO): 57 U/L (ref 36–130)
BUN: 20 mg/dL (ref 7–25)
CO2: 25 mmol/L (ref 20–32)
Calcium: 9.7 mg/dL (ref 8.6–10.3)
Chloride: 104 mmol/L (ref 98–110)
Creat: 1.01 mg/dL (ref 0.60–1.35)
GFR, Est African American: 104 mL/min/{1.73_m2} (ref >=60)
GFR, Est Non African American: 89 mL/min/{1.73_m2} (ref >=60)
Globulin: 2.6 g/dL (calc) (ref 1.9–3.7)
Glucose, Bld: 133 mg/dL — ABNORMAL HIGH (ref 65–99)
Potassium: 4.2 mmol/L (ref 3.5–5.3)
Sodium: 140 mmol/L (ref 135–146)
Total Bilirubin: 0.4 mg/dL (ref 0.2–1.2)
Total Protein: 7 g/dL (ref 6.1–8.1)

## 2019-06-29 LAB — CBC WITH DIFFERENTIAL/PLATELET
Absolute Monocytes: 546 cells/uL (ref 200–950)
Basophils Absolute: 59 cells/uL (ref 0–200)
Basophils Relative: 0.9 %
Eosinophils Absolute: 429 cells/uL (ref 15–500)
Eosinophils Relative: 6.6 %
HCT: 44.5 % (ref 38.5–50.0)
Hemoglobin: 14.9 g/dL (ref 13.2–17.1)
Lymphs Abs: 1528 cells/uL (ref 850–3900)
MCH: 28 pg (ref 27.0–33.0)
MCHC: 33.5 g/dL (ref 32.0–36.0)
MCV: 83.5 fL (ref 80.0–100.0)
MPV: 10.6 fL (ref 7.5–12.5)
Monocytes Relative: 8.4 %
Neutro Abs: 3939 cells/uL (ref 1500–7800)
Neutrophils Relative %: 60.6 %
Platelets: 293 10*3/uL (ref 140–400)
RBC: 5.33 10*6/uL (ref 4.20–5.80)
RDW: 13.2 % (ref 11.0–15.0)
Total Lymphocyte: 23.5 %
WBC: 6.5 10*3/uL (ref 3.8–10.8)

## 2019-06-29 LAB — HEMOGLOBIN A1C
Hgb A1c MFr Bld: 7 % of total Hgb — ABNORMAL HIGH (ref <5.7)
Mean Plasma Glucose: 154 (calc)
eAG (mmol/L): 8.5 (calc)

## 2019-06-29 LAB — LIPID PANEL
Cholesterol: 170 mg/dL (ref <200)
HDL: 61 mg/dL (ref >=40)
LDL Cholesterol (Calc): 88 mg/dL (calc)
Non-HDL Cholesterol (Calc): 109 mg/dL (calc) (ref <130)
Total CHOL/HDL Ratio: 2.8 (calc) (ref <5.0)
Triglycerides: 109 mg/dL (ref <150)

## 2019-06-29 MED ORDER — VITAMIN D (ERGOCALCIFEROL) 1.25 MG (50000 UNIT) PO CAPS: 50000.0000 [IU] | ORAL_CAPSULE | ORAL | 0 refills | Status: DC

## 2019-06-30 NOTE — Progress Notes (Signed)
Office: [phone]  /  Fax: [phone] TeleHealth Visit:  [name] has verbally consented to this TeleHealth visit today. The patient is located at home, the provider is located at the [facility] office. The participants in this visit include the listed provider and patient and any and all parties involved. The visit was conducted today via telephone. [name] was unable to use realtime audiovisual technology today and the telehealth visit was conducted via telephone.  HPI:   Chief Complaint: OBESITY [name] is here to discuss his progress with his obesity treatment plan. He is on the Category 4 plan and is following his eating plan approximately 90 % of the time. He states he is walking 15 to 20 minutes 2 to 3 times per week. [name] reports that he feels like he has been following the plan well. He is not journaling every day. We were unable to weigh the patient today for this TeleHealth visit. He feels as if he has maintained weight since his last visit (weight 258 lbs 06/28/19). He has lost 0 lbs since starting treatment with [name].  Diabetes II [name] has a diagnosis of diabetes type II. [name] is on metformin and he denies nausea, vomiting or diarrhea. His last A1c was at 7.0 He has been working on intensive lifestyle modifications including diet, exercise, and weight loss to help control his blood glucose levels.  Vitamin D deficiency [name] has a diagnosis of vitamin D deficiency. [name] is currently taking vit D and he denies nausea, vomiting or muscle weakness.  ASSESSMENT AND PLAN:  Type 2 diabetes mellitus without complication, without long-term current use of insulin (HCC)  Vitamin D deficiency - Plan: Vitamin D, Ergocalciferol, (DRISDOL) 1.25 MG (50000 UT) CAPS capsule  Class 1 obesity with serious comorbidity and body mass index (BMI) of 33.0 to 33.9 in adult, unspecified obesity type  PLAN:  Diabetes II [name] has been given extensive diabetes  education by myself today including ideal fasting and post-prandial blood glucose readings, individual ideal Hgb A1c goals and hypoglycemia prevention. We discussed the importance of good blood sugar control to decrease the likelihood of diabetic complications such as nephropathy, neuropathy, limb loss, blindness, coronary artery disease, and death. We discussed the importance of intensive lifestyle modification including diet, exercise and weight loss as the first line treatment for diabetes. [name] agrees to continue metformin and weight loss and he follow up at the agreed upon time.  Vitamin D Deficiency [name] was informed that low vitamin D levels contributes to fatigue and are associated with obesity, breast, and colon cancer. [name] agrees to continue to take prescription Vit D @50 ,000 IU twice weekly #10 with no refills and he will follow up for routine testing of vitamin D, at least 2-3 times per year. He was informed of the risk of over-replacement of vitamin D and agrees to not increase his dose unless he discusses this with [name] first. [name] agrees to follow up with our clinic in 2 weeks.  Obesity [name] is currently in the action stage of change. As such, his goal is to continue with weight loss efforts He has agreed to follow the Category 4 plan [name] has been instructed to work up to a goal of 150 minutes of combined cardio and strengthening exercise per week for weight loss and overall health benefits. We discussed the following Behavioral Modification Strategies today: keeping healthy foods in the home and work on meal planning and easy cooking plans  [name] has agreed to follow  up with our clinic in 2 weeks. He was informed of the importance of frequent follow up visits to maximize his success with intensive lifestyle modifications for his multiple health conditions.  ALLERGIES: Allergies  Allergen Reactions   Tomato Other (See Comments)    GI distress    MEDICATIONS: Current  Outpatient Medications on File Prior to Visit  Medication Sig Dispense Refill   DULoxetine (CYMBALTA) 60 MG capsule Take 60 mg by mouth daily.      etodolac (LODINE) 500 MG tablet TAKE 1 TABLET BY MOUTH TWICE DAILY AS NEEDED (MODERATE PAIN). 60 tablet 2   ipratropium (ATROVENT) 0.06 % nasal spray Place 2 sprays into both nostrils 3 (three) times daily as needed for rhinitis. 15 mL 3   metFORMIN (GLUCOPHAGE) 1000 MG tablet Take 1 tablet (1,000 mg total) by mouth 2 (two) times daily with a meal. 60 tablet 0   Multiple Vitamin (MULTIVITAMIN) capsule Take 1 capsule by mouth daily.     valsartan-hydrochlorothiazide (DIOVAN-HCT) 160-12.5 MG tablet TAKE 1 TABLET BY MOUTH EVERY MORNING. 90 tablet 3   No current facility-administered medications on file prior to visit.     PAST MEDICAL HISTORY: Past Medical History:  Diagnosis Date   Chest pain    Family history of adverse reaction to anesthesia    MOM-N/V, DAD-HAS PROBLEMS WITH [place] allergy    GERD (gastroesophageal reflux disease)    History of hiatal hernia    History of kidney stones    H/O   Insomnia    Joint pain    Plantar fasciitis    Seasonal allergies    Tendonitis of foot    left    PAST SURGICAL HISTORY: Past Surgical History:  Procedure Laterality Date   CARPAL TUNNEL RELEASE Right 07/03/2017   Procedure: CARPAL TUNNEL RELEASE;  Surgeon: Hessie Knows, MD;  Location: [facility];  Service: Orthopedics;  Laterality: Right;   clavide surgery  2005   CYSTOSCOPY/URETEROSCOPY/HOLMIUM LASER/STENT PLACEMENT Right 05/22/2018   Procedure: CYSTOSCOPY/URETEROSCOPY/HOLMIUM LASER/STENT PLACEMENT;  Surgeon: Billey Co, MD;  Location: [facility];  Service: Urology;  Laterality: Right;   KNEE SURGERY Right 2013   SHOULDER SURGERY Right 2008    SOCIAL HISTORY: Social History   Tobacco Use   Smoking status: Never Smoker   Smokeless tobacco: Never Used  Substance Use Topics   Alcohol use: Yes     Alcohol/week: 0.0 standard drinks    Comment: RARE   Drug use: No    FAMILY HISTORY: Family History  Problem Relation Age of Onset   Cancer Mother        colon/rectal   Hyperlipidemia Mother    Cancer Brother        hodgekins   Cancer Maternal Grandfather        lung   Stroke Maternal Grandfather    Diabetes Father    Hypertension Father    Hyperlipidemia Father    Heart disease Father    Alcoholism Father     ROS: Review of Systems  Constitutional: Negative for weight loss.  Gastrointestinal: Negative for diarrhea, nausea and vomiting.  Musculoskeletal:       Negative for muscle weakness    PHYSICAL EXAM: Pt in no acute distress  RECENT LABS AND TESTS: BMET    Component Value Date/Time   NA 140 06/28/2019 0818   NA 140 06/22/2019 0830   NA 138 05/20/2013 0815   K 4.2 06/28/2019 0818   K 3.7 05/20/2013 0815   CL  104 06/28/2019 0818   CL 106 05/20/2013 0815   CO2 25 06/28/2019 0818   CO2 26 05/20/2013 0815   GLUCOSE 133 (H) 06/28/2019 0818   GLUCOSE 101 (H) 05/20/2013 0815   BUN 20 06/28/2019 0818   BUN 17 06/22/2019 0830   BUN 20 (H) 05/20/2013 0815   CREATININE 1.01 06/28/2019 0818   CALCIUM 9.7 06/28/2019 0818   CALCIUM 8.9 05/20/2013 0815   GFRNONAA 89 06/28/2019 0818   GFRAA 104 06/28/2019 0818   Lab Results  Component Value Date   HGBA1C 7.0 (H) 06/28/2019   HGBA1C 7.1 (H) 06/22/2019   HGBA1C 6.3 (H) 06/22/2018   HGBA1C 6.2 (H) 01/28/2018   HGBA1C 6.7 11/07/2017   Lab Results  Component Value Date   INSULIN 17.6 06/22/2019   INSULIN 11.6 05/13/2018   INSULIN 10.5 01/28/2018   CBC    Component Value Date/Time   WBC 6.5 06/28/2019 0818   RBC 5.33 06/28/2019 0818   HGB 14.9 06/28/2019 0818   HGB 14.6 01/28/2018 1007   HCT 44.5 06/28/2019 0818   HCT 43.5 01/28/2018 1007   PLT 293 06/28/2019 0818   PLT 250 06/23/2017 1104   MCV 83.5 06/28/2019 0818   MCV 84 01/28/2018 1007   MCV 84 11/05/2011 1606   MCH 28.0 06/28/2019  0818   MCHC 33.5 06/28/2019 0818   RDW 13.2 06/28/2019 0818   RDW 14.3 01/28/2018 1007   RDW 13.0 11/05/2011 1606   LYMPHSABS 1,528 06/28/2019 0818   LYMPHSABS 1.5 01/28/2018 1007   LYMPHSABS 1.8 11/05/2011 1606   MONOABS 0.9 04/12/2018 1820   MONOABS 0.6 11/05/2011 1606   EOSABS 429 06/28/2019 0818   EOSABS 0.2 01/28/2018 1007   EOSABS 0.1 11/05/2011 1606   BASOSABS 59 06/28/2019 0818   BASOSABS 0.0 01/28/2018 1007   BASOSABS 0.0 11/05/2011 1606   Iron/TIBC/Ferritin/ %Sat No results found for: IRON, TIBC, FERRITIN, IRONPCTSAT Lipid Panel     Component Value Date/Time   CHOL 170 06/28/2019 0818   CHOL 176 06/22/2019 0830   TRIG 109 06/28/2019 0818   HDL 61 06/28/2019 0818   HDL 58 06/22/2019 0830   CHOLHDL 2.8 06/28/2019 0818   LDLCALC 88 06/28/2019 0818   Hepatic Function Panel     Component Value Date/Time   PROT 7.0 06/28/2019 0818   PROT 6.9 06/22/2019 0830   PROT 8.5 (H) 11/05/2011 1606   ALBUMIN 4.8 06/22/2019 0830   ALBUMIN 4.5 11/05/2011 1606   AST 22 06/28/2019 0818   AST 36 11/05/2011 1606   ALT 28 06/28/2019 0818   ALT 53 11/05/2011 1606   ALKPHOS 75 06/22/2019 0830   ALKPHOS 147 (H) 11/05/2011 1606   BILITOT 0.4 06/28/2019 0818   BILITOT 0.5 06/22/2019 0830   BILITOT 0.5 11/05/2011 1606      Component Value Date/Time   TSH 1.330 01/28/2018 1007   TSH 1.440 01/04/2016 0818     Ref. Range 06/22/2019 08:30  Vitamin D, 25-Hydroxy Latest Ref Range: 30.0 - 100.0 ng/mL 38.6    I, Doreene Nest, am acting as Location manager for [employer], PA-C I, Abby Potash, PA-C have reviewed above note and agree with its content

## 2019-07-05 ENCOUNTER — Encounter: Payer: 59 | Admitting: Family Medicine

## 2019-07-05 ENCOUNTER — Other Ambulatory Visit: Payer: Self-pay

## 2019-07-08 ENCOUNTER — Other Ambulatory Visit (INDEPENDENT_AMBULATORY_CARE_PROVIDER_SITE_OTHER): Payer: Self-pay | Admitting: Physician Assistant

## 2019-07-08 ENCOUNTER — Other Ambulatory Visit: Payer: Self-pay | Admitting: Family Medicine

## 2019-07-08 DIAGNOSIS — K219 Gastro-esophageal reflux disease without esophagitis: Secondary | ICD-10-CM

## 2019-07-08 DIAGNOSIS — E559 Vitamin D deficiency, unspecified: Secondary | ICD-10-CM

## 2019-07-12 ENCOUNTER — Other Ambulatory Visit: Payer: Self-pay

## 2019-07-12 ENCOUNTER — Ambulatory Visit (INDEPENDENT_AMBULATORY_CARE_PROVIDER_SITE_OTHER): Payer: 59 | Admitting: Family Medicine

## 2019-07-12 ENCOUNTER — Encounter: Payer: Self-pay | Admitting: Family Medicine

## 2019-07-12 VITALS — BP 137/89 | HR 79 | Ht 73.0 in | Wt 261.2 lb

## 2019-07-12 DIAGNOSIS — R0789 Other chest pain: Secondary | ICD-10-CM | POA: Diagnosis not present

## 2019-07-12 DIAGNOSIS — Z8249 Family history of ischemic heart disease and other diseases of the circulatory system: Secondary | ICD-10-CM | POA: Diagnosis not present

## 2019-07-12 DIAGNOSIS — I10 Essential (primary) hypertension: Secondary | ICD-10-CM

## 2019-07-12 DIAGNOSIS — F3342 Major depressive disorder, recurrent, in full remission: Secondary | ICD-10-CM | POA: Diagnosis not present

## 2019-07-12 DIAGNOSIS — E669 Obesity, unspecified: Secondary | ICD-10-CM | POA: Diagnosis not present

## 2019-07-12 DIAGNOSIS — Z0000 Encounter for general adult medical examination without abnormal findings: Secondary | ICD-10-CM

## 2019-07-12 DIAGNOSIS — E785 Hyperlipidemia, unspecified: Secondary | ICD-10-CM

## 2019-07-12 DIAGNOSIS — E1169 Type 2 diabetes mellitus with other specified complication: Secondary | ICD-10-CM | POA: Diagnosis not present

## 2019-07-12 DIAGNOSIS — E66811 Obesity, class 1: Secondary | ICD-10-CM

## 2019-07-12 DIAGNOSIS — Z23 Encounter for immunization: Secondary | ICD-10-CM

## 2019-07-12 NOTE — Patient Instructions (Addendum)
Thank you for coming to the office today.  Stay tuned for referral to Dr Tamala Julian for Cardiology  Ask them about the statin cholesterol medication. I do think it is a good recommendation.  Recent Labs    06/22/19 0830 06/28/19 0818  HGBA1C 7.1* 7.0*    Please schedule a Follow-up Appointment to: Return in about 6 months (around 01/10/2020) for 6 months DM A1c.  If you have any other questions or concerns, please feel free to call the office or send a message through [facility]. You may also schedule an earlier appointment if necessary.  Additionally, you may be receiving a survey about your experience at our office within a few days to 1 week by e-mail or mail. We value your feedback.  [name], DO [place]

## 2019-07-12 NOTE — Progress Notes (Signed)
Subjective:    Patient ID: [name], male    DOB: [birth year], 43 y.o.   MRN: [id number]  [name] is a 43 y.o. male presenting on 07/12/2019 for Annual Exam   HPI   Here for Annual Physical and Lab Review.  CHRONIC DM, Type 2: Obesity BMI >34 Followed by [facility] Endocrinology Dr [name] q 6 months. Last A1c recent labs 7, remained stable. Meds:Metformin 2000mg XR daily at bedtime Reports good compliance. Tolerating well w/o side-effects Currently on ARB Lifestyle: - Diet (Continues on improved DM diet) - Exercise (increasing exercise) Denies hypoglycemia  CHRONIC HTN: No new concerns. Current Meds - Valsartan-HCTZ 160-12.5mg  dailyin AM  Reports good compliance, took meds today. Tolerating well, w/o complaints.  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 Father had significant cardiac heart history in age early 1s - Still some active discomfort now but very mild and minimal and non exertional, was worse with exertion - patient had seen Dr [name] before at [facility] years ago, had stress testing, he is interested to return Denies diaphoresis, nausea vomiting, arm or jaw pain, nausea vomiting, dyspnea  Health Maintenance: Due for Flu Shot, will receive today    Depression screen [facility] 2/9 04/27/2019 11/17/2018 10/27/2018  Decreased Interest 0 1 0  Down, Depressed, Hopeless 0 0 1  PHQ - 2 Score 0 1 1  Altered sleeping 0 0 0  Tired, decreased energy 0 1 1  Change in appetite 0 0 1   Feeling bad or failure about yourself  0 0 0  Trouble concentrating 0 1 0  Moving slowly or fidgety/restless 0 0 0  Suicidal thoughts 0 0 0  PHQ-9 Score 0 3 3  Difficult doing work/chores Not difficult at all - -  Some recent data might be hidden    Past Medical History:  Diagnosis Date  . Chest pain   . Family history of adverse reaction to anesthesia    MOM-N/V, DAD-HAS PROBLEMS WITH NOVICAINE  . Food allergy   . GERD (gastroesophageal reflux disease)   . History of hiatal hernia   . History of kidney stones    H/O  . Insomnia   . Joint pain   . Plantar fasciitis   . Seasonal allergies   . Tendonitis of foot    left   Past Surgical History:  Procedure Laterality Date  . CARPAL TUNNEL RELEASE Right 07/03/2017   Procedure: CARPAL TUNNEL RELEASE;  Surgeon: Hessie Knows, MD;  Location: [facility] ORS;  Service: Orthopedics;  Laterality: Right;  . clavide surgery  2005  . CYSTOSCOPY/URETEROSCOPY/HOLMIUM LASER/STENT PLACEMENT Right 05/22/2018   Procedure: CYSTOSCOPY/URETEROSCOPY/HOLMIUM LASER/STENT PLACEMENT;  Surgeon: Billey Co, MD;  Location: [facility] ORS;  Service: Urology;  Laterality: Right;  . KNEE SURGERY Right 2013  . SHOULDER SURGERY Right 2008   Social History   Socioeconomic History  . Marital status: Married    Spouse name: [name]  . Number of children: 1  . Years of education: Not on file  . Highest education level: Not on file  Occupational  History  . 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 Financial resource strain: Not on file  . Food insecurity    Worry: Not on file    Inability: Not on file  . Transportation needs    Medical: Not on file    Non-medical: Not on file  Tobacco Use  . Smoking status: Never Smoker  . Smokeless tobacco: Never Used  Substance and Sexual Activity  . Alcohol use: Yes    Alcohol/week: 0.0 standard drinks    Comment: RARE  . Drug use: No  . Sexual  activity: Yes  Lifestyle  . Physical activity    Days per week: Not on file    Minutes per session: Not on file  . Stress: Not on file  Relationships  . Social Herbalist on phone: Not on file    Gets together: Not on file    Attends religious service: Not on file    Active member of club or organization: Not on file    Attends meetings of clubs or organizations: Not on file    Relationship status: Not on file  . Intimate partner violence    Fear of current or ex partner: Not on file    Emotionally abused: Not on file    Physically abused: Not on file    Forced sexual activity: Not on file  Other Topics Concern  . Not on file  Social History Narrative  . Not on file   Family History  Problem Relation Age of Onset  . Cancer Mother        colon/rectal  . Hyperlipidemia Mother   . Cancer Brother        hodgekins  . Cancer Maternal Grandfather        lung  . Stroke Maternal Grandfather   . Diabetes Father   . Hypertension Father   . Hyperlipidemia Father   . Heart disease Father 96  . Alcoholism Father    Current Outpatient Medications on File Prior to Visit  Medication Sig  . DULoxetine (CYMBALTA) 60 MG capsule Take 60 mg by mouth daily.   [name] esomeprazole (NEXIUM) 40 MG capsule TAKE 1 CAPSULE BY MOUTH ONCE DAILY  . etodolac (LODINE) 500 MG tablet TAKE 1 TABLET BY MOUTH TWICE DAILY AS NEEDED (MODERATE PAIN).  [name] ipratropium (ATROVENT) 0.06 % nasal spray Place 2 sprays into both nostrils 3 (three) times daily as needed for rhinitis.  . metFORMIN (GLUCOPHAGE) 1000 MG tablet Take 1 tablet (1,000 mg total) by mouth 2 (two) times daily with a meal.  . Multiple Vitamin (MULTIVITAMIN) capsule Take 1 capsule by mouth daily.  . valsartan-hydrochlorothiazide (DIOVAN-HCT) 160-12.5 MG tablet TAKE 1 TABLET BY MOUTH EVERY MORNING.  . Vitamin D, Ergocalciferol, (DRISDOL) 1.25 MG (50000 UT) CAPS capsule Take 1 capsule (50,000 Units total) by mouth 2 (two) times a week.   No  current facility-administered medications on file prior to visit.     Review of Systems  Constitutional: Negative for activity change, appetite change, chills, diaphoresis, fatigue and fever.  HENT: Negative for congestion and hearing loss.   Eyes: Negative for visual disturbance.  Respiratory: Positive for chest tightness (mixed atypical and typical L sided, mild present). Negative for apnea, cough, shortness of breath and wheezing.   Cardiovascular: Positive for chest pain (atypical, mild present). Negative for palpitations and leg swelling.  Gastrointestinal: Negative for abdominal pain, anal bleeding, blood  in stool, constipation, diarrhea, nausea and vomiting.  Endocrine: Negative for cold intolerance.  Genitourinary: Negative for decreased urine volume, difficulty urinating, dysuria, frequency, hematuria, testicular pain and urgency.  Musculoskeletal: Negative for arthralgias, back pain and neck pain.  Skin: Negative for rash.  Allergic/Immunologic: Negative for environmental allergies.  Neurological: Negative for dizziness, weakness, light-headedness, numbness and headaches.  Hematological: Negative for adenopathy.  Psychiatric/Behavioral: Negative for behavioral problems, dysphoric mood and sleep disturbance. The patient is not nervous/anxious.    Per HPI unless specifically indicated above      Objective:    BP 137/89 (BP Location: Right Arm, Patient Position: Sitting, Cuff Size: Large)   Pulse 79   Ht 6\' 1"  (1.854 m)   Wt 261 lb 3.2 oz (118.5 kg)   BMI 34.46 kg/m   Wt Readings from Last 3 Encounters:  07/12/19 261 lb 3.2 oz (118.5 kg)  06/16/19 258 lb (117 kg)  06/15/19 255 lb (115.7 kg)    Physical Exam Vitals signs and nursing note reviewed.  Constitutional:      General: He is not in acute distress.    Appearance: He is well-developed. He is not diaphoretic.     Comments: Well-appearing, comfortable, cooperative  HENT:     Head: Normocephalic and atraumatic.   Eyes:     General:        Right eye: No discharge.        Left eye: No discharge.     Conjunctiva/sclera: Conjunctivae normal.     Pupils: Pupils are equal, round, and reactive to light.  Neck:     Musculoskeletal: Normal range of motion and neck supple.     Thyroid: No thyromegaly.     Comments: No carotid bruits Cardiovascular:     Rate and Rhythm: Normal rate and regular rhythm.     Heart sounds: Normal heart sounds. No murmur.  Pulmonary:     Effort: Pulmonary effort is normal. No respiratory distress.     Breath sounds: Normal breath sounds. No wheezing or rales.  Chest:     Chest wall: No tenderness (not reproducible).  Abdominal:     General: Bowel sounds are normal. There is no distension.     Palpations: Abdomen is soft. There is no mass.     Tenderness: There is no abdominal tenderness.  Musculoskeletal: Normal range of motion.        General: No tenderness.     Comments: Upper / Lower Extremities: - Normal muscle tone, strength bilateral upper extremities 5/5, lower extremities 5/5  Lymphadenopathy:     Cervical: No cervical adenopathy.  Skin:    General: Skin is warm and dry.     Findings: No erythema or rash.  Neurological:     Mental Status: He is alert and oriented to person, place, and time.     Comments: Distal sensation intact to light touch all extremities  Psychiatric:        Behavior: Behavior normal.     Comments: Well groomed, good eye contact, normal speech and thoughts      EKG - performed in office today  Date: 07/12/19  Rate: HR 80  Rhythm: normal sinus rhythm  QRS Axis: normal  Intervals: normal  ST/T Wave abnormalities: normal  Conduction Disutrbances:none  Old EKG Reviewed: unchanged 01/28/18   Recent Labs    06/22/19 0830 06/28/19 0818  HGBA1C 7.1* 7.0*     Results for orders placed or performed in visit on 06/28/19  Lipid panel  Result Value  Ref Range   Cholesterol 170 <200 mg/dL   HDL 61 > OR = 40 mg/dL   Triglycerides  109 <150 mg/dL   LDL Cholesterol (Calc) 88 mg/dL (calc)   Total CHOL/HDL Ratio 2.8 <5.0 (calc)   Non-HDL Cholesterol (Calc) 109 <130 mg/dL (calc)  COMPLETE METABOLIC PANEL WITH GFR  Result Value Ref Range   Glucose, Bld 133 (H) 65 - 99 mg/dL   BUN 20 7 - 25 mg/dL   Creat 1.01 0.60 - 1.35 mg/dL   GFR, Est Non African American 89 > OR = 60 mL/min/1.30m2   GFR, Est African American 104 > OR = 60 mL/min/1.15m2   BUN/Creatinine Ratio NOT APPLICABLE 6 - 22 (calc)   Sodium 140 135 - 146 mmol/L   Potassium 4.2 3.5 - 5.3 mmol/L   Chloride 104 98 - 110 mmol/L   CO2 25 20 - 32 mmol/L   Calcium 9.7 8.6 - 10.3 mg/dL   Total Protein 7.0 6.1 - 8.1 g/dL   Albumin 4.4 3.6 - 5.1 g/dL   Globulin 2.6 1.9 - 3.7 g/dL (calc)   AG Ratio 1.7 1.0 - 2.5 (calc)   Total Bilirubin 0.4 0.2 - 1.2 mg/dL   Alkaline phosphatase (APISO) 57 36 - 130 U/L   AST 22 10 - 40 U/L   ALT 28 9 - 46 U/L  CBC with Differential/Platelet  Result Value Ref Range   WBC 6.5 3.8 - 10.8 Thousand/uL   RBC 5.33 4.20 - 5.80 Million/uL   Hemoglobin 14.9 13.2 - 17.1 g/dL   HCT 44.5 38.5 - 50.0 %   MCV 83.5 80.0 - 100.0 fL   MCH 28.0 27.0 - 33.0 pg   MCHC 33.5 32.0 - 36.0 g/dL   RDW 13.2 11.0 - 15.0 %   Platelets 293 140 - 400 Thousand/uL   MPV 10.6 7.5 - 12.5 fL   Neutro Abs 3,939 1,500 - 7,800 cells/uL   Lymphs Abs 1,528 850 - 3,900 cells/uL   Absolute Monocytes 546 200 - 950 cells/uL   Eosinophils Absolute 429 15 - 500 cells/uL   Basophils Absolute 59 0 - 200 cells/uL   Neutrophils Relative % 60.6 %   Total Lymphocyte 23.5 %   Monocytes Relative 8.4 %   Eosinophils Relative 6.6 %   Basophils Relative 0.9 %  Hemoglobin A1c  Result Value Ref Range   Hgb A1c MFr Bld 7.0 (H) <5.7 % of total Hgb   Mean Plasma Glucose 154 (calc)   eAG (mmol/L) 8.5 (calc)      Assessment & Plan:   Problem List Items Addressed This Visit    Type 2 diabetes mellitus with other specified complication ([place])    Well-controlled DM with [id number]  Complicated with HLD, HTN, GERD Followed by [facility] Endocrinology Dr [name]  Plan:  1. Continue current therapy - Metformin XR 2000mg  daily 2. Encourage improved lifestyle - low carb, low sugar diet, reduce portion size, continue improving regular exercise 3. Continue f/u Endocrinology      Hyperlipidemia associated with type 2 diabetes mellitus ([place])    Controlled cholesterol on lifestyle Last lipid panel 06/2019 Calculated ASCVD 10 yr risk score 3.6%, low risk despite DM  Plan: 1. Discussion on reducing ASCVD risk as diabetic - defer additional rx meds for now = reconsider near age 57+ - we discussed given his fam history of heart disease I am more concerned for him and would recommend Statin therapy now if he would like to start, would be low dose,  will defer this decision now to Cardiology patient will think about this option. 2. Encourage improved lifestyle - low carb/cholesterol, reduce portion size, continue improving regular exercise 3. Follow-up q 1 yr lipids      Relevant Orders   Ambulatory referral to Cardiology   Essential hypertension    Controlled HTN Home readings normal No known complications    Plan:  1. Continue current BP regimen - Valsartan-HCTZ 160-12.5mg  daily 2. Encourage improved lifestyle - low sodium diet, regular exercise 3. May continue monitor BP outside office, bring readings to next visit, if persistently >140/90 or new symptoms notify office sooner      Relevant Orders   Ambulatory referral to Cardiology   Depression, major, recurrent, in complete remission ([place])    Stable, controlled. Seems currently in remission. Suspected factor with Seasonal Affective Disorder Followed by Psychiatry Dr Toy Care in [place] OFF Wellbutrin, Fluoxetine Continue Duloxetine 60mg  daily       Other Visit Diagnoses    Annual physical exam    -  Primary   Needs flu shot       Relevant Orders   Flu Vaccine QUAD 6+ mos PF IM (Fluarix Quad PF) (Completed)   Family  history of heart disease       Relevant Orders   Ambulatory referral to Cardiology   Intermittent left-sided chest pain       Relevant Orders   Ambulatory referral to Cardiology   EKG 12-Lead   Obesity (BMI 30.0-34.9)           Updated Health Maintenance information Flu vaccine today Reviewed recent lab results with patient Encouraged improvement to lifestyle with diet and exercise - Goal of weight loss  #Chest Pain Concerning some exertional onset symptoms, seems mild and persistent with atypical features still mild active symptoms now without any other associated symptoms.  Check EKG today, normal. Reviewed compared to 2019. Referral to [place], can return to prior cardiology Dr Tamala Julian for further work-up Concern given his fam history Offer statin therapy  Orders Placed This Encounter  Procedures  . Flu Vaccine QUAD 6+ mos PF IM (Fluarix Quad PF)  . Ambulatory referral to Cardiology    Referral Priority:   Routine    Referral Type:   Consultation    Referral Reason:   Specialty Services Required    Referred to Provider:   Belva Crome, MD    Requested Specialty:   Cardiology    Number of Visits Requested:   1  . EKG 12-Lead     No orders of the defined types were placed in this encounter.   Follow up plan: Return in about 6 months (around 01/10/2020) for 6 months DM A1c.  [name], [facility] 07/12/2019, 9:31 AM

## 2019-07-13 NOTE — Assessment & Plan Note (Signed)
Controlled cholesterol on lifestyle Last lipid panel 06/2019 Calculated ASCVD 10 yr risk score 3.6%, low risk despite DM  Plan: 1. Discussion on reducing ASCVD risk as diabetic - defer additional rx meds for now = reconsider near age 45+ - we discussed given his fam history of heart disease I am more concerned for him and would recommend Statin therapy now if he would like to start, would be low dose, will defer this decision now to Cardiology patient will think about this option. 2. Encourage improved lifestyle - low carb/cholesterol, reduce portion size, continue improving regular exercise 3. Follow-up q 1 yr lipids

## 2019-07-13 NOTE — Assessment & Plan Note (Signed)
Well-controlled DM with [id number] Complicated with HLD, HTN, GERD Followed by [facility] Endocrinology Dr Gabriel Carina  Plan:  1. Continue current therapy - Metformin XR 2000mg  daily 2. Encourage improved lifestyle - low carb, low sugar diet, reduce portion size, continue improving regular exercise 3. Continue f/u Endocrinology

## 2019-07-13 NOTE — Assessment & Plan Note (Addendum)
Stable, controlled. Seems currently in remission. Suspected factor with Seasonal Affective Disorder Followed by Psychiatry Dr Toy Care in [facility] OFF Wellbutrin, Fluoxetine Continue Duloxetine 60mg  daily

## 2019-07-13 NOTE — Assessment & Plan Note (Signed)
Controlled HTN Home readings normal No known complications    Plan:  1. Continue current BP regimen - Valsartan-HCTZ 160-12.5mg  daily 2. Encourage improved lifestyle - low sodium diet, regular exercise 3. May continue monitor BP outside office, bring readings to next visit, if persistently >140/90 or new symptoms notify office sooner

## 2019-07-14 ENCOUNTER — Ambulatory Visit (INDEPENDENT_AMBULATORY_CARE_PROVIDER_SITE_OTHER): Payer: 59 | Admitting: Physician Assistant

## 2019-07-14 ENCOUNTER — Other Ambulatory Visit: Payer: Self-pay

## 2019-07-14 DIAGNOSIS — Z6833 Body mass index (BMI) 33.0-33.9, adult: Secondary | ICD-10-CM

## 2019-07-14 DIAGNOSIS — E669 Obesity, unspecified: Secondary | ICD-10-CM

## 2019-07-14 DIAGNOSIS — E559 Vitamin D deficiency, unspecified: Secondary | ICD-10-CM | POA: Diagnosis not present

## 2019-07-14 DIAGNOSIS — E119 Type 2 diabetes mellitus without complications: Secondary | ICD-10-CM | POA: Diagnosis not present

## 2019-07-14 MED ORDER — VITAMIN D (ERGOCALCIFEROL) 1.25 MG (50000 UNIT) PO CAPS: 50000.0000 [IU] | ORAL_CAPSULE | ORAL | 0 refills | Status: DC

## 2019-07-14 NOTE — Progress Notes (Signed)
Office: [phone]  /  Fax: [phone] TeleHealth Visit:  [name] has verbally consented to this TeleHealth visit today. The patient is located at work, the provider is located at the [employer] [facility] office. The participants in this visit include the listed provider and patient. [name] was unable to use realtime audiovisual technology today and the telehealth visit was conducted via telephone.  HPI:   Chief Complaint: OBESITY [name] is here to discuss his progress with his obesity treatment plan. He is on the Category 4 plan and is following his eating plan approximately 95 % of the time. He states he is walking 3 to 4 miles 5 to 6 times per week. [name] reports that he has been very stressed with work, but has been able to eat on the plan pretty well. He continues to report a great deal of fatigue after being diagnosed with COVID.  We were unable to weigh the patient today for this TeleHealth visit. He feels as if he has lost weight since his last visit. He has lost 0 lbs since starting treatment with [name].  Vitamin D Deficiency [name] has a diagnosis of vitamin D deficiency. He is currently on vit D. [name] denies nausea, vomiting, or muscle weakness.  Diabetes II [name] has a diagnosis of diabetes type II. [name] states that his fasting blood sugars are in the 90's. His last A1c was 7.0 on 06/28/19. He is taking metformin and he denies any hypoglycemic episodes. He has been working on intensive lifestyle modifications including diet, exercise, and weight loss to help control his blood glucose levels.  ASSESSMENT AND PLAN:  Vitamin D deficiency - Plan: Vitamin D, Ergocalciferol, (DRISDOL) 1.25 MG (50000 UT) CAPS capsule  Type 2 diabetes mellitus without complication, without long-term current use of insulin (HCC)  Class 1 obesity with serious comorbidity and body mass index (BMI) of 33.0 to 33.9 in adult, unspecified obesity type  PLAN:  Vitamin D Deficiency  Zebulin was informed that low vitamin D levels contribute to fatigue and are associated with obesity, breast, and colon cancer. [name] agrees to continue to take prescription Vit D @50 ,000 IU 2 times per week #10 with no refills and will follow up for routine testing of vitamin D, at least 2-3 times per year. He was informed of the risk of over-replacement of vitamin D and agrees to not increase his dose unless he discusses this with [name] first. Milez agrees to follow up in 3 weeks as directed.  Diabetes II [name] has been given extensive diabetes education by myself today including ideal fasting and post-prandial blood glucose readings, individual ideal Hgb A1c goals, and hypoglycemia prevention. We discussed the importance of good blood sugar control to decrease the likelihood of diabetic complications such as nephropathy, neuropathy, limb loss, blindness, coronary artery disease, and death. We discussed the importance of intensive lifestyle modification including diet, exercise, and weight loss as the first line treatment for diabetes. [name] agrees to continue his diabetes medications and weight loss. He will follow up at the agreed upon time.   Obesity [name] is currently in the action stage of change. As such, his goal is to continue with weight loss efforts. He has agreed to follow the Category 4 plan. [name] has been instructed to work up to a goal of 150 minutes of combined cardio and strengthening exercise per week for weight loss and overall health benefits. We discussed the following Behavioral Modification Strategies today: work on meal planning and easy cooking plans  and keeping healthy foods in the home.  [name] has agreed to follow up with our clinic in 3 weeks. He was informed of the importance of frequent follow up visits to maximize his success with intensive lifestyle modifications for his multiple health conditions.  ALLERGIES: Allergies  Allergen Reactions  . Tomato Other (See  Comments)    GI distress    MEDICATIONS: Current Outpatient Medications on File Prior to Visit  Medication Sig Dispense Refill  . DULoxetine (CYMBALTA) 60 MG capsule Take 60 mg by mouth daily.     [name] esomeprazole (NEXIUM) 40 MG capsule TAKE 1 CAPSULE BY MOUTH ONCE DAILY 90 capsule 1  . etodolac (LODINE) 500 MG tablet TAKE 1 TABLET BY MOUTH TWICE DAILY AS NEEDED (MODERATE PAIN). 60 tablet 2  . ipratropium (ATROVENT) 0.06 % nasal spray Place 2 sprays into both nostrils 3 (three) times daily as needed for rhinitis. 15 mL 3  . metFORMIN (GLUCOPHAGE) 1000 MG tablet Take 1 tablet (1,000 mg total) by mouth 2 (two) times daily with a meal. 60 tablet 0  . Multiple Vitamin (MULTIVITAMIN) capsule Take 1 capsule by mouth daily.    . valsartan-hydrochlorothiazide (DIOVAN-HCT) 160-12.5 MG tablet TAKE 1 TABLET BY MOUTH EVERY MORNING. 90 tablet 3   No current facility-administered medications on file prior to visit.     PAST MEDICAL HISTORY: Past Medical History:  Diagnosis Date  . Chest pain   . Family history of adverse reaction to anesthesia    MOM-N/V, DAD-HAS PROBLEMS WITH NOVICAINE  . Food allergy   . GERD (gastroesophageal reflux disease)   . History of hiatal hernia   . History of kidney stones    H/O  . Insomnia   . Joint pain   . Plantar fasciitis   . Seasonal allergies   . Tendonitis of foot    left    PAST SURGICAL HISTORY: Past Surgical History:  Procedure Laterality Date  . CARPAL TUNNEL RELEASE Right 07/03/2017   Procedure: CARPAL TUNNEL RELEASE;  Surgeon: Hessie Knows, MD;  Location: [facility] OR[facility];  Service: Orthopedics;  Laterality: Right;  . clavide surgery  2005  . CYSTOSCOPY/URETEROSCOPY/HOLMIUM LASER/STENT PLACEMENT Right 05/22/2018   Procedure: CYSTOSCOPY/URETEROSCOPY/HOLMIUM LASER/STENT PLACEMENT;  Surgeon: Billey Co, MD;  Location: [facility] OR[facility];  Service: Urology;  Laterality: Right;  . KNEE SURGERY Right 2013  . SHOULDER SURGERY Right 2008    SOCIAL HISTORY:  Social History   Tobacco Use  . Smoking status: Never Smoker  . Smokeless tobacco: Never Used  Substance Use Topics  . Alcohol use: Yes    Alcohol/week: 0.0 standard drinks    Comment: RARE  . Drug use: No    FAMILY HISTORY: Family History  Problem Relation Age of Onset  . Cancer Mother        colon/rectal  . Hyperlipidemia Mother   . Cancer Brother        hodgekins  . Cancer Maternal Grandfather        lung  . Stroke Maternal Grandfather   . Diabetes Father   . Hypertension Father   . Hyperlipidemia Father   . Heart disease Father 63  . Alcoholism Father     ROS: Review of Systems  Constitutional: Positive for weight loss.  Gastrointestinal: Negative for nausea and vomiting.  Musculoskeletal:       Negative for muscle weakness.  Endo/Heme/Allergies:       Negative for hypoglycemia.    PHYSICAL EXAM: Pt in no acute distress  RECENT LABS AND TESTS:  BMET    Component Value Date/Time   NA 140 06/28/2019 0818   NA 140 06/22/2019 0830   NA 138 05/20/2013 0815   K 4.2 06/28/2019 0818   K 3.7 05/20/2013 0815   CL 104 06/28/2019 0818   CL 106 05/20/2013 0815   CO2 25 06/28/2019 0818   CO2 26 05/20/2013 0815   GLUCOSE 133 (H) 06/28/2019 0818   GLUCOSE 101 (H) 05/20/2013 0815   BUN 20 06/28/2019 0818   BUN 17 06/22/2019 0830   BUN 20 (H) 05/20/2013 0815   CREATININE 1.01 06/28/2019 0818   CALCIUM 9.7 06/28/2019 0818   CALCIUM 8.9 05/20/2013 0815   GFRNONAA 89 06/28/2019 0818   GFRAA 104 06/28/2019 0818   Lab Results  Component Value Date   HGBA1C 7.0 (H) 06/28/2019   HGBA1C 7.1 (H) 06/22/2019   HGBA1C 6.3 (H) 06/22/2018   HGBA1C 6.2 (H) 01/28/2018   HGBA1C 6.7 11/07/2017   Lab Results  Component Value Date   INSULIN 17.6 06/22/2019   INSULIN 11.6 05/13/2018   INSULIN 10.5 01/28/2018   CBC    Component Value Date/Time   WBC 6.5 06/28/2019 0818   RBC 5.33 06/28/2019 0818   HGB 14.9 06/28/2019 0818   HGB 14.6 01/28/2018 1007   HCT 44.5  06/28/2019 0818   HCT 43.5 01/28/2018 1007   PLT 293 06/28/2019 0818   PLT 250 06/23/2017 1104   MCV 83.5 06/28/2019 0818   MCV 84 01/28/2018 1007   MCV 84 11/05/2011 1606   MCH 28.0 06/28/2019 0818   MCHC 33.5 06/28/2019 0818   RDW 13.2 06/28/2019 0818   RDW 14.3 01/28/2018 1007   RDW 13.0 11/05/2011 1606   LYMPHSABS 1,528 06/28/2019 0818   LYMPHSABS 1.5 01/28/2018 1007   LYMPHSABS 1.8 11/05/2011 1606   MONOABS 0.9 04/12/2018 1820   MONOABS 0.6 11/05/2011 1606   EOSABS 429 06/28/2019 0818   EOSABS 0.2 01/28/2018 1007   EOSABS 0.1 11/05/2011 1606   BASOSABS 59 06/28/2019 0818   BASOSABS 0.0 01/28/2018 1007   BASOSABS 0.0 11/05/2011 1606   Iron/TIBC/Ferritin/ %Sat No results found for: IRON, TIBC, FERRITIN, IRONPCTSAT Lipid Panel     Component Value Date/Time   CHOL 170 06/28/2019 0818   CHOL 176 06/22/2019 0830   TRIG 109 06/28/2019 0818   HDL 61 06/28/2019 0818   HDL 58 06/22/2019 0830   CHOLHDL 2.8 06/28/2019 0818   LDLCALC 88 06/28/2019 0818   Hepatic Function Panel     Component Value Date/Time   PROT 7.0 06/28/2019 0818   PROT 6.9 06/22/2019 0830   PROT 8.5 (H) 11/05/2011 1606   ALBUMIN 4.8 06/22/2019 0830   ALBUMIN 4.5 11/05/2011 1606   AST 22 06/28/2019 0818   AST 36 11/05/2011 1606   ALT 28 06/28/2019 0818   ALT 53 11/05/2011 1606   ALKPHOS 75 06/22/2019 0830   ALKPHOS 147 (H) 11/05/2011 1606   BILITOT 0.4 06/28/2019 0818   BILITOT 0.5 06/22/2019 0830   BILITOT 0.5 11/05/2011 1606      Component Value Date/Time   TSH 1.330 01/28/2018 1007   TSH 1.440 01/04/2016 0818   Results for Devereux, Sayvon C "NED" (MRN [id number]) as of 07/14/2019 12:26  Ref. Range 06/22/2019 08:30  Vitamin D, 25-Hydroxy Latest Ref Range: 30.0 - 100.0 ng/mL 38.6    I, Marcille Blanco, CMA, am acting as Location manager for Abby Potash, PA-C I, Abby Potash, PA-C have reviewed above note and agree with its content

## 2019-07-28 ENCOUNTER — Other Ambulatory Visit: Payer: Self-pay

## 2019-07-28 ENCOUNTER — Ambulatory Visit (INDEPENDENT_AMBULATORY_CARE_PROVIDER_SITE_OTHER): Payer: 59 | Admitting: Cardiology

## 2019-07-28 ENCOUNTER — Telehealth: Payer: Self-pay

## 2019-07-28 VITALS — BP 138/99 | HR 94 | Ht 73.0 in | Wt 257.2 lb

## 2019-07-28 DIAGNOSIS — I10 Essential (primary) hypertension: Secondary | ICD-10-CM | POA: Diagnosis not present

## 2019-07-28 DIAGNOSIS — Z01812 Encounter for preprocedural laboratory examination: Secondary | ICD-10-CM

## 2019-07-28 DIAGNOSIS — E785 Hyperlipidemia, unspecified: Secondary | ICD-10-CM | POA: Diagnosis not present

## 2019-07-28 DIAGNOSIS — R072 Precordial pain: Secondary | ICD-10-CM

## 2019-07-28 MED ORDER — ATORVASTATIN CALCIUM 20 MG PO TABS: 20.0000 mg | ORAL_TABLET | Freq: Every day | ORAL | 11 refills | Status: DC

## 2019-07-28 MED ORDER — METOPROLOL TARTRATE 100 MG PO TABS: ORAL_TABLET | ORAL | 0 refills | Status: DC

## 2019-07-28 MED ORDER — NITROGLYCERIN 0.4 MG SL SUBL: 0.4000 mg | SUBLINGUAL_TABLET | SUBLINGUAL | 11 refills | Status: DC | PRN

## 2019-07-28 NOTE — Patient Instructions (Addendum)
Medication Instructions:  Start: Atorvastatin 20 mg daily Take Nitroglycerin as needed as directed    *If you need a refill on your cardiac medications before your next appointment, please call your pharmacy*  Lab Work: Your physician recommends that you return for lab work in 1 week prior to procedure (BMP).  If you have labs (blood work) drawn today and your tests are completely normal, you will receive your results only by: [name] MyChart Message (if you have MyChart) OR . A paper copy in the mail If you have any lab test that is abnormal or we need to change your treatment, we will call you to review the results.  Testing/Procedures: Your physician has requested that you have an echocardiogram. Echocardiography is a painless test that uses sound waves to create images of your heart. It provides your doctor with information about the size and shape of your heart and how well your heart's chambers and valves are working. This procedure takes approximately one hour. There are no restrictions for this procedure. [place] [address]  Non-Cardiac CT Angiography (CTA), is a special type of CT scan that uses a computer to produce multi-dimensional views of major blood vessels throughout the body. In CT angiography, a contrast material is injected through an IV to help visualize the blood vessels [facility]  Follow-Up: At [facility], you and your health needs are our priority.  As part of our continuing mission to provide you with exceptional heart care, we have created designated Provider Care Teams.  These Care Teams include your primary Cardiologist (physician) and Advanced Practice Providers (APPs -  Physician Assistants and Nurse Practitioners) who all work together to provide you with the care you need, when you need it.  Your next appointment:   2 month  The format for your next appointment:   In Person  Provider:   Oswaldo Milian, MD  Your cardiac CT will  be scheduled at one of the below locations:   [facility] [address] [place], [place] [address] [phone]  If scheduled at [facility], please arrive at the [facility] main entrance of [facility] 30-45 minutes prior to test start time. Proceed to the [facility] Radiology Department (first floor) to check-in and test prep.  If scheduled at [facility], please arrive 15 mins early for check-in and test prep.  Please follow these instructions carefully (unless otherwise directed):  Hold all erectile dysfunction medications at least 3 days (72 hrs) prior to test.  On the Night Before the Test: . Be sure to Drink plenty of water. . Do not consume any caffeinated/decaffeinated beverages or chocolate 12 hours prior to your test. . Do not take any antihistamines 12 hours prior to your test.   On the Day of the Test: . Drink plenty of water. Do not drink any water within one hour of the test. . Do not eat any food 4 hours prior to the test. . You may take your regular medications prior to the test.  . Take metoprolol (Lopressor) two hours prior to test. . HOLD Furosemide/Hydrochlorothiazide morning of the test.       After the Test: . Drink plenty of water. . After receiving IV contrast, you may experience a mild flushed feeling. This is normal. . On occasion, you may experience a mild rash up to 24 hours after the test. This is not dangerous. If this occurs, you can take Benadryl 25  mg and increase your fluid intake. . If you experience trouble breathing, this can be serious. If it is severe call 911 IMMEDIATELY. If it is mild, please call our office. . If you take any of these medications: Glipizide/Metformin, Avandament, Glucavance, please do not take 48 hours after completing test unless otherwise instructed.   Once we have confirmed authorization from your insurance company, we will call you to set up a date and time  for your test.   For non-scheduling related questions, please contact the cardiac imaging nurse navigator should you have any questions/concerns: Marchia Bond, RN Navigator Cardiac Imaging [facility] [phone] Office   Nitroglycerin sublingual tablets What is this medicine? NITROGLYCERIN (nye troe GLI ser in) is a type of vasodilator. It relaxes blood vessels, increasing the blood and oxygen supply to your heart. This medicine is used to relieve chest pain caused by angina. It is also used to prevent chest pain before activities like climbing stairs, going outdoors in cold weather, or sexual activity. This medicine may be used for other purposes; ask your health care provider or pharmacist if you have questions. COMMON BRAND NAME(S): Nitroquick, Nitrostat, Nitrotab What should I tell my health care provider before I take this medicine? They need to know if you have any of these conditions:  anemia  head injury, recent stroke, or bleeding in the brain  liver disease  previous heart attack  an unusual or allergic reaction to nitroglycerin, other medicines, foods, dyes, or preservatives  pregnant or trying to get pregnant  breast-feeding How should I use this medicine? Take this medicine by mouth as needed. At the first sign of an angina attack (chest pain or tightness) place one tablet under your tongue. You can also take this medicine 5 to 10 minutes before an event likely to produce chest pain. Follow the directions on the prescription label. Let the tablet dissolve under the tongue. Do not swallow whole. Replace the dose if you accidentally swallow it. It will help if your mouth is not dry. Saliva around the tablet will help it to dissolve more quickly. Do not eat or drink, smoke or chew tobacco while a tablet is dissolving. If you are not better within 5 minutes after taking ONE dose of nitroglycerin, call 9-1-1 immediately to seek emergency medical care.  Do not take more than 3 nitroglycerin tablets over 15 minutes. If you take this medicine often to relieve symptoms of angina, your doctor or health care professional may provide you with different instructions to manage your symptoms. If symptoms do not go away after following these instructions, it is important to call 9-1-1 immediately. Do not take more than 3 nitroglycerin tablets over 15 minutes. Talk to your pediatrician regarding the use of this medicine in children. Special care may be needed. Overdosage: If you think you have taken too much of this medicine contact a poison control center or emergency room at once. NOTE: This medicine is only for you. Do not share this medicine with others. What if I miss a dose? This does not apply. This medicine is only used as needed. What may interact with this medicine? Do not take this medicine with any of the following medications:  certain migraine medicines like ergotamine and dihydroergotamine (DHE)  medicines used to treat erectile dysfunction like sildenafil, tadalafil, and vardenafil  riociguat This medicine may also interact with the following medications:  alteplase  aspirin  heparin  medicines for high blood pressure  medicines for  mental depression  other medicines used to treat angina  phenothiazines like chlorpromazine, mesoridazine, prochlorperazine, thioridazine This list may not describe all possible interactions. Give your health care provider a list of all the medicines, herbs, non-prescription drugs, or dietary supplements you use. Also tell them if you smoke, drink alcohol, or use illegal drugs. Some items may interact with your medicine. What should I watch for while using this medicine? Tell your doctor or health care professional if you feel your medicine is no longer working. Keep this medicine with you at all times. Sit or lie down when you take your medicine to prevent falling if you feel dizzy or faint after  using it. Try to remain calm. This will help you to feel better faster. If you feel dizzy, take several deep breaths and lie down with your feet propped up, or bend forward with your head resting between your knees. You may get drowsy or dizzy. Do not drive, use machinery, or do anything that needs mental alertness until you know how this drug affects you. Do not stand or sit up quickly, especially if you are an older patient. This reduces the risk of dizzy or fainting spells. Alcohol can make you more drowsy and dizzy. Avoid alcoholic drinks. Do not treat yourself for coughs, colds, or pain while you are taking this medicine without asking your doctor or health care professional for advice. Some ingredients may increase your blood pressure. What side effects may I notice from receiving this medicine? Side effects that you should report to your doctor or health care professional as soon as possible:  blurred vision  dry mouth  skin rash  sweating  the feeling of extreme pressure in the head  unusually weak or tired Side effects that usually do not require medical attention (report to your doctor or health care professional if they continue or are bothersome):  flushing of the face or neck  headache  irregular heartbeat, palpitations  nausea, vomiting This list may not describe all possible side effects. Call your doctor for medical advice about side effects. You may report side effects to FDA at 1-800-FDA-1088. Where should I keep my medicine? Keep out of the reach of children. Store at room temperature between 20 and 25 degrees C (68 and 77 degrees F). Store in [employer]. Protect from light and moisture. Keep tightly closed. Throw away any unused medicine after the expiration date. NOTE: This sheet is a summary. It may not cover all possible information. If you have questions about this medicine, talk to your doctor, pharmacist, or health care provider.  2020 [employer]vier/[employer]  Standard (2013-07-01 17:57:36)

## 2019-07-28 NOTE — Telephone Encounter (Signed)
Called pt to make sure he hold his metformin 48 hours after procedure. Left message to call back.

## 2019-07-28 NOTE — Progress Notes (Signed)
Cardiology Office Note:    Date:  07/28/2019   ID:  [name], DOB [birth year], MRN [id number]  PCP:  Olin Hauser, DO  Cardiologist:  No primary care provider on file.  Electrophysiologist:  None   Referring MD: [name] *   Chief Complaint  Patient presents with   Chest Pain    History of Present Illness:    [name] is a 43 y.o. male with a hx of type 2 diabetes, hypertension who is referred by Dr. Parks Ranger for an evaluation for chest pain.  Reports that he has been having chest pain for last couple weeks.  Has been occurring every day.  Describes left-sided chest pressure that is brought on with exertion.  States that particularly going up and down stairs or lifting objects at work he will feel the chest pain.  Radiates to left arm/jaw.  He saw Dr. Tamala Julian for chest pain around 2010 and had a exercise treadmill test that was negative.  Never smoked.  Thinks his father had an MI in his 41s.     Past Medical History:  Diagnosis Date   Chest pain    Family history of adverse reaction to anesthesia    MOM-N/V, DAD-HAS PROBLEMS WITH NOVICAINE   Food allergy    GERD (gastroesophageal reflux disease)    History of hiatal hernia    History of kidney stones    H/O   Insomnia    Joint pain    Plantar fasciitis    Seasonal allergies    Tendonitis of foot    left    Past Surgical History:  Procedure Laterality Date   CARPAL TUNNEL RELEASE Right 07/03/2017   Procedure: CARPAL TUNNEL RELEASE;  Surgeon: Hessie Knows, MD;  Location: [facility] ORS;  Service: Orthopedics;  Laterality: Right;   clavide surgery  2005   CYSTOSCOPY/URETEROSCOPY/HOLMIUM LASER/STENT PLACEMENT Right 05/22/2018   Procedure: CYSTOSCOPY/URETEROSCOPY/HOLMIUM LASER/STENT PLACEMENT;  Surgeon: Billey Co, MD;  Location: [facility] ORS;  Service: Urology;  Laterality: Right;   KNEE SURGERY Right 2013   SHOULDER SURGERY Right 2008    Current Medications: Current  Meds  Medication Sig   DULoxetine (CYMBALTA) 60 MG capsule Take 60 mg by mouth daily.    esomeprazole (NEXIUM) 40 MG capsule TAKE 1 CAPSULE BY MOUTH ONCE DAILY   etodolac (LODINE) 500 MG tablet TAKE 1 TABLET BY MOUTH TWICE DAILY AS NEEDED (MODERATE PAIN).   ipratropium (ATROVENT) 0.06 % nasal spray Place 2 sprays into both nostrils 3 (three) times daily as needed for rhinitis.   metFORMIN (GLUCOPHAGE) 1000 MG tablet Take 1 tablet (1,000 mg total) by mouth 2 (two) times daily with a meal.   Multiple Vitamin (MULTIVITAMIN) capsule Take 1 capsule by mouth daily.   valsartan-hydrochlorothiazide (DIOVAN-HCT) 160-12.5 MG tablet TAKE 1 TABLET BY MOUTH EVERY MORNING.   Vitamin D, Ergocalciferol, (DRISDOL) 1.25 MG (50000 UT) CAPS capsule Take 1 capsule (50,000 Units total) by mouth 2 (two) times a week.     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 Marine scientist)  Social Designer, fashion/clothing strain: Not on file   Food insecurity    Worry: Not  on file    Inability: Not on file   Transportation needs    Medical: Not on file    Non-medical: Not on file  Tobacco Use   Smoking status: Never Smoker   Smokeless tobacco: Never Used  Substance and Sexual Activity   Alcohol use: Yes    Alcohol/week: 0.0 standard drinks    Comment: RARE   Drug use: No   Sexual activity: Yes  Lifestyle   Physical activity    Days per week: Not on file    Minutes per session: Not on file   Stress: Not on file  Relationships   Social connections    Talks on phone: Not on file    Gets together: Not on file    Attends religious service: Not on file    Active member of club or organization: Not on file    Attends  meetings of clubs or organizations: Not on file    Relationship status: Not on file  Other Topics Concern   Not on file  Social History Narrative   Not on file     Family History: The patient's family history includes Alcoholism in his father; Cancer in his brother, maternal grandfather, and mother; Diabetes in his father; Heart disease (age of onset: 11) in his father; Hyperlipidemia in his father and mother; Hypertension in his father; Stroke in his maternal grandfather.  ROS:   Please see the history of present illness.    All other systems reviewed and are negative.  EKGs/Labs/Other Studies Reviewed:    The following studies were reviewed today:   EKG:  EKG is ordered today.  The ekg ordered today demonstrates normal sinus rhythm, rate 85, no ST/T abnormalities  Recent Labs: 06/28/2019: ALT 28; BUN 20; Creat 1.01; Hemoglobin 14.9; Platelets 293; Potassium 4.2; Sodium 140  Recent Lipid Panel    Component Value Date/Time   CHOL 170 06/28/2019 0818   CHOL 176 06/22/2019 0830   TRIG 109 06/28/2019 0818   HDL 61 06/28/2019 0818   HDL 58 06/22/2019 0830   CHOLHDL 2.8 06/28/2019 0818   LDLCALC 88 06/28/2019 0818    Physical Exam:    VS:  BP (!) 138/99    Pulse 94    Ht 6\' 1"  (1.854 m)    Wt 257 lb 3.2 oz (116.7 kg)    SpO2 99%    BMI 33.93 kg/m     Wt Readings from Last 3 Encounters:  07/28/19 257 lb 3.2 oz (116.7 kg)  07/12/19 261 lb 3.2 oz (118.5 kg)  06/16/19 258 lb (117 kg)     GEN:  Well nourished, well developed in no acute distress HEENT: Normal NECK: No JVD; No carotid bruits LYMPHATICS: No lymphadenopathy CARDIAC: RRR, no murmurs, rubs, gallops RESPIRATORY:  Clear to auscultation without rales, wheezing or rhonchi  ABDOMEN: Soft, non-tender, non-distended MUSCULOSKELETAL:  No edema; No deformity  SKIN: Warm and dry NEUROLOGIC:  Alert and oriented x 3 PSYCHIATRIC:  Normal affect   ASSESSMENT:    1. Pre-procedure lab exam   2. Precordial pain   3.  Essential hypertension   4. Hyperlipidemia, unspecified hyperlipidemia type    PLAN:    In order of problems listed above:  Chest pain: description consistent with typical angina. -Coronary CTA -TTE -PRN nitroglycerin -Given typical symptoms, discussed starting an antianginal medication such as metoprolol, but he would prefer to hold off until coronary CTA is done -Given diabetes history, meets indication for statin.  LDL 88 on 06/28/19,  will start atorvastatin 20 mg daily  Hypertension: On valsartan-HCTZ 160-12.5 mg daily  HLD: LDL 88 on 06/28/19, will start atorvastatin 20 mg daily as above  Type 2 diabetes: on metformin.  Last A1c 7.0 06/28/19   RTC in 2 months   Medication Adjustments/Labs and Tests Ordered: Current medicines are reviewed at length with the patient today.  Concerns regarding medicines are outlined above.  Orders Placed This Encounter  Procedures   CT CORONARY MORPH W/CTA COR W/SCORE W/CA W/CM &/OR WO/CM   CT CORONARY FRACTIONAL FLOW RESERVE DATA PREP   CT CORONARY FRACTIONAL FLOW RESERVE FLUID ANALYSIS   Basic metabolic panel   EKG [id number]   ECHOCARDIOGRAM COMPLETE   Meds ordered this encounter  Medications   nitroGLYCERIN (NITROSTAT) 0.4 MG SL tablet    Sig: Place 1 tablet (0.4 mg total) under the tongue every 5 (five) minutes as needed for chest pain.    Dispense:  25 tablet    Refill:  11   atorvastatin (LIPITOR) 20 MG tablet    Sig: Take 1 tablet (20 mg total) by mouth daily at 6 PM.    Dispense:  30 tablet    Refill:  11   metoprolol tartrate (LOPRESSOR) 100 MG tablet    Sig: TAKE 1 TABLET 2 HR PRIOR TO CARDIAC PROCEDURE    Dispense:  1 tablet    Refill:  0    Patient Instructions  Medication Instructions:  Start: Atorvastatin 20 mg daily Take Nitroglycerin as needed as directed    *If you need a refill on your cardiac medications before your next appointment, please call your pharmacy*  Lab Work: Your physician recommends  that you return for lab work in 1 week prior to procedure (BMP).  If you have labs (blood work) drawn today and your tests are completely normal, you will receive your results only by:  [facility] (if you have MyChart) OR  A paper copy in the mail If you have any lab test that is abnormal or we need to change your treatment, we will call you to review the results.  Testing/Procedures: Your physician has requested that you have an echocardiogram. Echocardiography is a painless test that uses sound waves to create images of your heart. It provides your doctor with information about the size and shape of your heart and how well your hearts chambers and valves are working. This procedure takes approximately one hour. There are no restrictions for this procedure. [place] [address]  Non-Cardiac CT Angiography (CTA), is a special type of CT scan that uses a computer to produce multi-dimensional views of major blood vessels throughout the body. In CT angiography, a contrast material is injected through an IV to help visualize the blood vessels [facility]  Follow-Up: At [facility], you and your health needs are our priority.  As part of our continuing mission to provide you with exceptional heart care, we have created designated Provider Care Teams.  These Care Teams include your primary Cardiologist (physician) and Advanced Practice Providers (APPs -  Physician Assistants and Nurse Practitioners) who all work together to provide you with the care you need, when you need it.  Your next appointment:   2 month  The format for your next appointment:   In Person  Provider:   Oswaldo Milian, MD  Your cardiac CT will be scheduled at one of the below locations:   [facility] [address] [place], [place] [address] [phone]  If scheduled at [facility], please arrive at the [facility] main entrance of [facility] 30-45  minutes prior to test start time. Proceed to the [facility] Radiology Department (first floor) to check-in and test prep.  If scheduled at [facility], please arrive 15 mins early for check-in and test prep.  Please follow these instructions carefully (unless otherwise directed):  Hold all erectile dysfunction medications at least 3 days (72 hrs) prior to test.  On the Night Before the Test:  Be sure to Drink plenty of water.  Do not consume any caffeinated/decaffeinated beverages or chocolate 12 hours prior to your test.  Do not take any antihistamines 12 hours prior to your test.   On the Day of the Test:  Drink plenty of water. Do not drink any water within one hour of the test.  Do not eat any food 4 hours prior to the test.  You may take your regular medications prior to the test.   Take metoprolol (Lopressor) two hours prior to test.  HOLD Furosemide/Hydrochlorothiazide morning of the test.       After the Test:  Drink plenty of water.  After receiving IV contrast, you may experience a mild flushed feeling. This is normal.  On occasion, you may experience a mild rash up to 24 hours after the test. This is not dangerous. If this occurs, you can take Benadryl 25 mg and increase your fluid intake.  If you experience trouble breathing, this can be serious. If it is severe call 911 IMMEDIATELY. If it is mild, please call our office.  If you take any of these medications: Glipizide/Metformin, Avandament, Glucavance, please do not take 48 hours after completing test unless otherwise instructed.   Once we have confirmed authorization from your insurance company, we will call you to set up a date and time for your test.   For non-scheduling related questions, please contact the cardiac imaging nurse navigator should you have any questions/concerns: Marchia Bond, RN Navigator Cardiac Imaging [facility] [phone]  Office   Nitroglycerin sublingual tablets What is this medicine? NITROGLYCERIN (nye troe GLI ser in) is a type of vasodilator. It relaxes blood vessels, increasing the blood and oxygen supply to your heart. This medicine is used to relieve chest pain caused by angina. It is also used to prevent chest pain before activities like climbing stairs, going outdoors in cold weather, or sexual activity. This medicine may be used for other purposes; ask your health care provider or pharmacist if you have questions. COMMON BRAND NAME(S): Nitroquick, Nitrostat, Nitrotab What should I tell my health care provider before I take this medicine? They need to know if you have any of these conditions:  anemia  head injury, recent stroke, or bleeding in the brain  liver disease  previous heart attack  an unusual or allergic reaction to nitroglycerin, other medicines, foods, dyes, or preservatives  pregnant or trying to get pregnant  breast-feeding How should I use this medicine? Take this medicine by mouth as needed. At the first sign of an angina attack (chest pain or tightness) place one tablet under your tongue. You can also take this medicine 5 to 10 minutes before an event likely to produce chest pain. Follow the directions on the prescription label. Let the tablet dissolve under the tongue. Do not swallow whole. Replace the dose if you accidentally swallow it. It will help if your mouth is not dry. Saliva around the  tablet will help it to dissolve more quickly. Do not eat or drink, smoke or chew tobacco while a tablet is dissolving. If you are not better within 5 minutes after taking ONE dose of nitroglycerin, call 9-1-1 immediately to seek emergency medical care. Do not take more than 3 nitroglycerin tablets over 15 minutes. If you take this medicine often to relieve symptoms of angina, your doctor or health care professional may provide you with different instructions to manage your symptoms. If  symptoms do not go away after following these instructions, it is important to call 9-1-1 immediately. Do not take more than 3 nitroglycerin tablets over 15 minutes. Talk to your pediatrician regarding the use of this medicine in children. Special care may be needed. Overdosage: If you think you have taken too much of this medicine contact a poison control center or emergency room at once. NOTE: This medicine is only for you. Do not share this medicine with others. What if I miss a dose? This does not apply. This medicine is only used as needed. What may interact with this medicine? Do not take this medicine with any of the following medications:  certain migraine medicines like ergotamine and dihydroergotamine (DHE)  medicines used to treat erectile dysfunction like sildenafil, tadalafil, and vardenafil  riociguat This medicine may also interact with the following medications:  alteplase  aspirin  heparin  medicines for high blood pressure  medicines for mental depression  other medicines used to treat angina  phenothiazines like chlorpromazine, mesoridazine, prochlorperazine, thioridazine This list may not describe all possible interactions. Give your health care provider a list of all the medicines, herbs, non-prescription drugs, or dietary supplements you use. Also tell them if you smoke, drink alcohol, or use illegal drugs. Some items may interact with your medicine. What should I watch for while using this medicine? Tell your doctor or health care professional if you feel your medicine is no longer working. Keep this medicine with you at all times. Sit or lie down when you take your medicine to prevent falling if you feel dizzy or faint after using it. Try to remain calm. This will help you to feel better faster. If you feel dizzy, take several deep breaths and lie down with your feet propped up, or bend forward with your head resting between your knees. You may get drowsy or  dizzy. Do not drive, use machinery, or do anything that needs mental alertness until you know how this drug affects you. Do not stand or sit up quickly, especially if you are an older patient. This reduces the risk of dizzy or fainting spells. Alcohol can make you more drowsy and dizzy. Avoid alcoholic drinks. Do not treat yourself for coughs, colds, or pain while you are taking this medicine without asking your doctor or health care professional for advice. Some ingredients may increase your blood pressure. What side effects may I notice from receiving this medicine? Side effects that you should report to your doctor or health care professional as soon as possible:  blurred vision  dry mouth  skin rash  sweating  the feeling of extreme pressure in the head  unusually weak or tired Side effects that usually do not require medical attention (report to your doctor or health care professional if they continue or are bothersome):  flushing of the face or neck  headache  irregular heartbeat, palpitations  nausea, vomiting This list may not describe all possible side effects. Call your doctor for medical advice about side  effects. You may report side effects to FDA at 1-800-FDA-1088. Where should I keep my medicine? Keep out of the reach of children. Store at room temperature between 20 and 25 degrees C (68 and 77 degrees F). Store in [employer]. Protect from light and moisture. Keep tightly closed. Throw away any unused medicine after the expiration date. NOTE: This sheet is a summary. It may not cover all possible information. If you have questions about this medicine, talk to your doctor, pharmacist, or health care provider.  2020 Elsevier/Gold Standard (2013-07-01 17:57:36)        Signed, Donato Heinz, MD  07/28/2019 2:32 PM    [facility]

## 2019-08-02 ENCOUNTER — Other Ambulatory Visit: Payer: Self-pay

## 2019-08-02 ENCOUNTER — Telehealth (INDEPENDENT_AMBULATORY_CARE_PROVIDER_SITE_OTHER): Payer: 59 | Admitting: Physician Assistant

## 2019-08-02 DIAGNOSIS — Z6830 Body mass index (BMI) 30.0-30.9, adult: Secondary | ICD-10-CM | POA: Diagnosis not present

## 2019-08-02 DIAGNOSIS — E66811 Obesity, class 1: Secondary | ICD-10-CM

## 2019-08-02 DIAGNOSIS — E559 Vitamin D deficiency, unspecified: Secondary | ICD-10-CM

## 2019-08-02 DIAGNOSIS — E669 Obesity, unspecified: Secondary | ICD-10-CM | POA: Diagnosis not present

## 2019-08-02 MED ORDER — VITAMIN D (ERGOCALCIFEROL) 1.25 MG (50000 UNIT) PO CAPS: 50000.0000 [IU] | ORAL_CAPSULE | ORAL | 0 refills | Status: DC

## 2019-08-04 NOTE — Progress Notes (Signed)
Office: [phone]  /  Fax: [phone] TeleHealth Visit:  [name] has verbally consented to this TeleHealth visit today. The patient is located at work, the provider is located at the [employer] [facility] office. The participants in this visit include the listed provider and patient and any and all parties involved. The visit was conducted today via telephone. [name] was unable to use realtime audiovisual technology today and the telehealth visit was conducted via telephone (time spent on call was 18    minutes).  HPI:   Chief Complaint: OBESITY [name] is here to discuss his progress with his obesity treatment plan. He is on the Category 4 plan and is following his eating plan approximately 50 % of the time. He states he is exercising 0 minutes 0 times per week. [name]'s most recent weight is at 255 pounds (08/02/19). He reports that he has been very stressed with work and has not been as hungry. He is traveling out of town next weekend.  We were unable to weigh the patient today for this TeleHealth visit. He feels as if he has lost weight since his last visit. He has lost 0 lbs since starting treatment with [name].  Vitamin D deficiency [name] has a diagnosis of vitamin D deficiency. [name] is currently taking vit D and he denies nausea, vomiting or muscle weakness.  ASSESSMENT AND PLAN:  Vitamin D deficiency - Plan: Vitamin D, Ergocalciferol, (DRISDOL) 1.25 MG (50000 UT) CAPS capsule  Class 1 obesity with serious comorbidity and body mass index (BMI) of 30.0 to 30.9 in adult, unspecified obesity type  PLAN:  Vitamin D Deficiency [name] was informed that low vitamin D levels contributes to fatigue and are associated with obesity, breast, and colon cancer. He agrees to continue to take prescription Vit D @50 ,000 IU 2 times per week #10 with no refills and he will follow up for routine testing of vitamin D, at least 2-3 times per year. He was informed of the risk of  over-replacement of vitamin D and agrees to not increase his dose unless he discusses this with [name] first.  Obesity [name] is currently in the action stage of change. As such, his goal is to continue with weight loss efforts He has agreed to follow the Category 4 plan [name] has been instructed to work up to a goal of 150 minutes of combined cardio and strengthening exercise per week for weight loss and overall health benefits. We discussed the following Behavioral Modification Strategies today: increasing lean protein intake, work on meal planning and easy cooking plans and holiday eating strategies   [name] has agreed to follow up with our clinic in 3 weeks. He was informed of the importance of frequent follow up visits to maximize his success with intensive lifestyle modifications for his multiple health conditions.  ALLERGIES: Allergies  Allergen Reactions   Tomato Other (See Comments)    GI distress    MEDICATIONS: Current Outpatient Medications on File Prior to Visit  Medication Sig Dispense Refill   atorvastatin (LIPITOR) 20 MG tablet Take 1 tablet (20 mg total) by mouth daily at 6 PM. 30 tablet 11   DULoxetine (CYMBALTA) 60 MG capsule Take 60 mg by mouth daily.      esomeprazole (NEXIUM) 40 MG capsule TAKE 1 CAPSULE BY MOUTH ONCE DAILY 90 capsule 1   etodolac (LODINE) 500 MG tablet TAKE 1 TABLET BY MOUTH TWICE DAILY AS NEEDED (MODERATE PAIN). 60 tablet 2   ipratropium (ATROVENT) 0.06 % nasal  spray Place 2 sprays into both nostrils 3 (three) times daily as needed for rhinitis. 15 mL 3   metFORMIN (GLUCOPHAGE) 1000 MG tablet Take 1 tablet (1,000 mg total) by mouth 2 (two) times daily with a meal. 60 tablet 0   metoprolol tartrate (LOPRESSOR) 100 MG tablet TAKE 1 TABLET 2 HR PRIOR TO CARDIAC PROCEDURE 1 tablet 0   Multiple Vitamin (MULTIVITAMIN) capsule Take 1 capsule by mouth daily.     nitroGLYCERIN (NITROSTAT) 0.4 MG SL tablet Place 1 tablet (0.4 mg total) under the  tongue every 5 (five) minutes as needed for chest pain. 25 tablet 11   valsartan-hydrochlorothiazide (DIOVAN-HCT) 160-12.5 MG tablet TAKE 1 TABLET BY MOUTH EVERY MORNING. 90 tablet 3   No current facility-administered medications on file prior to visit.     PAST MEDICAL HISTORY: Past Medical History:  Diagnosis Date   Chest pain    Family history of adverse reaction to anesthesia    MOM-N/V, DAD-HAS PROBLEMS WITH [place] allergy    GERD (gastroesophageal reflux disease)    History of hiatal hernia    History of kidney stones    H/O   Insomnia    Joint pain    Plantar fasciitis    Seasonal allergies    Tendonitis of foot    left    PAST SURGICAL HISTORY: Past Surgical History:  Procedure Laterality Date   CARPAL TUNNEL RELEASE Right 07/03/2017   Procedure: CARPAL TUNNEL RELEASE;  Surgeon: Hessie Knows, MD;  Location: [facility];  Service: Orthopedics;  Laterality: Right;   clavide surgery  2005   CYSTOSCOPY/URETEROSCOPY/HOLMIUM LASER/STENT PLACEMENT Right 05/22/2018   Procedure: CYSTOSCOPY/URETEROSCOPY/HOLMIUM LASER/STENT PLACEMENT;  Surgeon: Billey Co, MD;  Location: [facility];  Service: Urology;  Laterality: Right;   KNEE SURGERY Right 2013   SHOULDER SURGERY Right 2008    SOCIAL HISTORY: Social History   Tobacco Use   Smoking status: Never Smoker   Smokeless tobacco: Never Used  Substance Use Topics   Alcohol use: Yes    Alcohol/week: 0.0 standard drinks    Comment: RARE   Drug use: No    FAMILY HISTORY: Family History  Problem Relation Age of Onset   Cancer Mother        colon/rectal   Hyperlipidemia Mother    Cancer Brother        hodgekins   Cancer Maternal Grandfather        lung   Stroke Maternal Grandfather    Diabetes Father    Hypertension Father    Hyperlipidemia Father    Heart disease Father 53   Alcoholism Father     ROS: Review of Systems  Constitutional: Positive for weight loss.    Gastrointestinal: Negative for nausea and vomiting.  Musculoskeletal:       Negative for muscle weakness    PHYSICAL EXAM: Pt in no acute distress  RECENT LABS AND TESTS: BMET    Component Value Date/Time   NA 140 06/28/2019 0818   NA 140 06/22/2019 0830   NA 138 05/20/2013 0815   K 4.2 06/28/2019 0818   K 3.7 05/20/2013 0815   CL 104 06/28/2019 0818   CL 106 05/20/2013 0815   CO2 25 06/28/2019 0818   CO2 26 05/20/2013 0815   GLUCOSE 133 (H) 06/28/2019 0818   GLUCOSE 101 (H) 05/20/2013 0815   BUN 20 06/28/2019 0818   BUN 17 06/22/2019 0830   BUN 20 (H) 05/20/2013 0815   CREATININE 1.01 06/28/2019 0818  CALCIUM 9.7 06/28/2019 0818   CALCIUM 8.9 05/20/2013 0815   GFRNONAA 89 06/28/2019 0818   GFRAA 104 06/28/2019 0818   Lab Results  Component Value Date   HGBA1C 7.0 (H) 06/28/2019   HGBA1C 7.1 (H) 06/22/2019   HGBA1C 6.3 (H) 06/22/2018   HGBA1C 6.2 (H) 01/28/2018   HGBA1C 6.7 11/07/2017   Lab Results  Component Value Date   INSULIN 17.6 06/22/2019   INSULIN 11.6 05/13/2018   INSULIN 10.5 01/28/2018   CBC    Component Value Date/Time   WBC 6.5 06/28/2019 0818   RBC 5.33 06/28/2019 0818   HGB 14.9 06/28/2019 0818   HGB 14.6 01/28/2018 1007   HCT 44.5 06/28/2019 0818   HCT 43.5 01/28/2018 1007   PLT 293 06/28/2019 0818   PLT 250 06/23/2017 1104   MCV 83.5 06/28/2019 0818   MCV 84 01/28/2018 1007   MCV 84 11/05/2011 1606   MCH 28.0 06/28/2019 0818   MCHC 33.5 06/28/2019 0818   RDW 13.2 06/28/2019 0818   RDW 14.3 01/28/2018 1007   RDW 13.0 11/05/2011 1606   LYMPHSABS 1,528 06/28/2019 0818   LYMPHSABS 1.5 01/28/2018 1007   LYMPHSABS 1.8 11/05/2011 1606   MONOABS 0.9 04/12/2018 1820   MONOABS 0.6 11/05/2011 1606   EOSABS 429 06/28/2019 0818   EOSABS 0.2 01/28/2018 1007   EOSABS 0.1 11/05/2011 1606   BASOSABS 59 06/28/2019 0818   BASOSABS 0.0 01/28/2018 1007   BASOSABS 0.0 11/05/2011 1606   Iron/TIBC/Ferritin/ %Sat No results found for: IRON,  TIBC, FERRITIN, IRONPCTSAT Lipid Panel     Component Value Date/Time   CHOL 170 06/28/2019 0818   CHOL 176 06/22/2019 0830   TRIG 109 06/28/2019 0818   HDL 61 06/28/2019 0818   HDL 58 06/22/2019 0830   CHOLHDL 2.8 06/28/2019 0818   LDLCALC 88 06/28/2019 0818   Hepatic Function Panel     Component Value Date/Time   PROT 7.0 06/28/2019 0818   PROT 6.9 06/22/2019 0830   PROT 8.5 (H) 11/05/2011 1606   ALBUMIN 4.8 06/22/2019 0830   ALBUMIN 4.5 11/05/2011 1606   AST 22 06/28/2019 0818   AST 36 11/05/2011 1606   ALT 28 06/28/2019 0818   ALT 53 11/05/2011 1606   ALKPHOS 75 06/22/2019 0830   ALKPHOS 147 (H) 11/05/2011 1606   BILITOT 0.4 06/28/2019 0818   BILITOT 0.5 06/22/2019 0830   BILITOT 0.5 11/05/2011 1606      Component Value Date/Time   TSH 1.330 01/28/2018 1007   TSH 1.440 01/04/2016 0818     Ref. Range 06/22/2019 08:30  Vitamin D, 25-Hydroxy Latest Ref Range: 30.0 - 100.0 ng/mL 38.6    I, Doreene Nest, am acting as Location manager for [employer], PA-C I, Abby Potash, PA-C have reviewed above note and agree with its content

## 2019-08-09 ENCOUNTER — Ambulatory Visit
Admission: EM | Admit: 2019-08-09 | Discharge: 2019-08-09 | Disposition: A | Discharge status: Home / Self Care | Payer: 59 | Attending: Family Medicine | Admitting: Family Medicine

## 2019-08-09 ENCOUNTER — Other Ambulatory Visit: Payer: Self-pay

## 2019-08-09 ENCOUNTER — Other Ambulatory Visit: Payer: Self-pay | Admitting: Family Medicine

## 2019-08-09 ENCOUNTER — Encounter: Payer: Self-pay | Admitting: Emergency Medicine

## 2019-08-09 ENCOUNTER — Ambulatory Visit (HOSPITAL_COMMUNITY): Payer: 59 | Attending: Cardiovascular Disease

## 2019-08-09 DIAGNOSIS — R072 Precordial pain: Secondary | ICD-10-CM | POA: Insufficient documentation

## 2019-08-09 DIAGNOSIS — M1990 Unspecified osteoarthritis, unspecified site: Secondary | ICD-10-CM

## 2019-08-09 DIAGNOSIS — S60459A Superficial foreign body of unspecified finger, initial encounter: Secondary | ICD-10-CM | POA: Diagnosis not present

## 2019-08-09 DIAGNOSIS — Z1833 Retained wood fragments: Secondary | ICD-10-CM

## 2019-08-09 MED ORDER — MUPIROCIN 2 % EX OINT: TOPICAL_OINTMENT | CUTANEOUS | 0 refills | Status: DC

## 2019-08-09 MED ORDER — CEPHALEXIN 500 MG PO CAPS: 500.0000 mg | ORAL_CAPSULE | Freq: Three times a day (TID) | ORAL | 0 refills | Status: AC

## 2019-08-09 NOTE — Telephone Encounter (Signed)
Pt updated and verbalized understanding.  

## 2019-08-09 NOTE — ED Provider Notes (Signed)
[facility] ____________________________________________  Time seen: Approximately 2:14 PM  I have reviewed the triage vital signs and the nursing notes.   HISTORY  Chief Complaint splinter under finger nail   HPI [name] is a 43 y.o. male presenting for evaluation of splinter to right index finger underneath the nail that occurred just prior to arrival.  Patient reports he went to pick up a wooden pallet and accidentally obtained the splinter.  States area is throbbing aching.  Denies pain radiation, paresthesias or other injury.  Denies alleviating measures prior to arrival.  Reports last tetanus immunization is within the last 10 years.  Reports otherwise doing well.  Olin Hauser, DO : PCP   Past Medical History:  Diagnosis Date  . Chest pain   . Family history of adverse reaction to anesthesia    MOM-N/V, DAD-HAS PROBLEMS WITH [place]  . Food allergy   . GERD (gastroesophageal reflux disease)   . History of hiatal hernia   . History of kidney stones    H/O  . Insomnia   . Joint pain   . Plantar fasciitis   . Seasonal allergies   . Tendonitis of foot    left    Patient Active Problem List   Diagnosis Date Noted  . Ureteral calculus 04/17/2018  . Renal colic 123456  . Other fatigue 01/28/2018  . Shortness of breath on exertion 01/28/2018  . Seasonal affective disorder ([place]) 12/23/2017  . Seasonal allergic rhinitis 06/24/2017  . Hyperlipidemia associated with type 2 diabetes mellitus ([place]) 03/12/2017  . Osteoarthritis of multiple joints 10/08/2016  . Pain of right thumb 05/10/2016  . Plantar fasciitis 02/25/2016  . Achilles tendinitis 02/25/2016  . Type 2 diabetes mellitus with other specified complication ([place]) [id number]  . GERD (gastroesophageal reflux disease) 01/02/2016  . Essential hypertension 06/27/2015  . Depression, major, recurrent, in complete remission ([place]) 06/27/2015    Past Surgical History:  Procedure  Laterality Date  . CARPAL TUNNEL RELEASE Right 07/03/2017   Procedure: CARPAL TUNNEL RELEASE;  Surgeon: Hessie Knows, MD;  Location: [facility];  Service: Orthopedics;  Laterality: Right;  . clavide surgery  2005  . CYSTOSCOPY/URETEROSCOPY/HOLMIUM LASER/STENT PLACEMENT Right 05/22/2018   Procedure: CYSTOSCOPY/URETEROSCOPY/HOLMIUM LASER/STENT PLACEMENT;  Surgeon: Billey Co, MD;  Location: [facility];  Service: Urology;  Laterality: Right;  . KNEE SURGERY Right 2013  . SHOULDER SURGERY Right 2008     No current facility-administered medications for this encounter.   Current Outpatient Medications:  .  atorvastatin (LIPITOR) 20 MG tablet, Take 1 tablet (20 mg total) by mouth daily at 6 PM., Disp: 30 tablet, Rfl: 11 .  DULoxetine (CYMBALTA) 60 MG capsule, Take 60 mg by mouth daily. , Disp: , Rfl:  .  esomeprazole (NEXIUM) 40 MG capsule, TAKE 1 CAPSULE BY MOUTH ONCE DAILY, Disp: 90 capsule, Rfl: 1 .  etodolac (LODINE) 500 MG tablet, TAKE 1 TABLET BY MOUTH TWICE DAILY AS NEEDED (MODERATE PAIN)., Disp: 60 tablet, Rfl: 2 .  ipratropium (ATROVENT) 0.06 % nasal spray, Place 2 sprays into both nostrils 3 (three) times daily as needed for rhinitis., Disp: 15 mL, Rfl: 3 .  metFORMIN (GLUCOPHAGE) 1000 MG tablet, Take 1 tablet (1,000 mg total) by mouth 2 (two) times daily with a meal., Disp: 60 tablet, Rfl: 0 .  metoprolol tartrate (LOPRESSOR) 100 MG tablet, TAKE 1 TABLET 2 HR PRIOR TO CARDIAC PROCEDURE, Disp: 1 tablet, Rfl: 0 .  Multiple Vitamin (MULTIVITAMIN) capsule, Take 1 capsule by mouth daily.,  Disp: , Rfl:  .  nitroGLYCERIN (NITROSTAT) 0.4 MG SL tablet, Place 1 tablet (0.4 mg total) under the tongue every 5 (five) minutes as needed for chest pain., Disp: 25 tablet, Rfl: 11 .  valsartan-hydrochlorothiazide (DIOVAN-HCT) 160-12.5 MG tablet, TAKE 1 TABLET BY MOUTH EVERY MORNING., Disp: 90 tablet, Rfl: 3 .  Vitamin D, Ergocalciferol, (DRISDOL) 1.25 MG (50000 UT) CAPS capsule, Take 1 capsule (50,000  Units total) by mouth 2 (two) times a week., Disp: 10 capsule, Rfl: 0 .  cephALEXin (KEFLEX) 500 MG capsule, Take 1 capsule (500 mg total) by mouth 3 (three) times daily for 5 days., Disp: 15 capsule, Rfl: 0 .  mupirocin ointment (BACTROBAN) 2 %, Apply two times a day for 5 days., Disp: 22 g, Rfl: 0  Allergies Tomato  Family History  Problem Relation Age of Onset  . Cancer Mother        colon/rectal  . Hyperlipidemia Mother   . Cancer Brother        hodgekins  . Cancer Maternal Grandfather        lung  . Stroke Maternal Grandfather   . Diabetes Father   . Hypertension Father   . Hyperlipidemia Father   . Heart disease Father 90  . Alcoholism Father     Social History Social History   Tobacco Use  . Smoking status: Never Smoker  . Smokeless tobacco: Never Used  Substance Use Topics  . Alcohol use: Yes    Alcohol/week: 0.0 standard drinks    Comment: RARE  . Drug use: No    Review of Systems Constitutional: No fever Cardiovascular: Denies chest pain. Respiratory: Denies shortness of breath. Musculoskeletal: Right index finger pain. Skin: Splinter to index finger.   ____________________________________________   PHYSICAL EXAM:  VITAL SIGNS: ED Triage Vitals  Enc Vitals Group     BP 08/09/19 1236 (!) 153/117     Pulse Rate 08/09/19 1236 85     Resp 08/09/19 1236 18     Temp 08/09/19 1236 98.2 F (36.8 C)     Temp Source 08/09/19 1236 Oral     SpO2 08/09/19 1236 100 %     Weight 08/09/19 1233 258 lb (117 kg)     Height 08/09/19 1233 6\' 1"  (1.854 m)     Head Circumference --      Peak Flow --      Pain Score 08/09/19 1233 5     Pain Loc --      Pain Edu? --      Excl. in [place]? --     Constitutional: Alert and oriented. Well appearing and in no acute distress. Eyes: Conjunctivae are normal.  ENT      Head: Normocephalic and atraumatic. Cardiovascular:  Good peripheral circulation. Respiratory: Normal respiratory effort without tachypnea nor  retractions. Musculoskeletal: Steady gait. Except: Right index finger medial aspect beneath the fingernail visible splinter consistent with a wood grain approximately 2 mm x 1 cm in size, tenderness to direct palpation, no bony tenderness, good resisted distal flexion and extension, normal distal sensation and index finger. Neurologic:  Normal speech and language.Speech is normal. No gait instability.  Skin:  Skin is warm, dry  Psychiatric: Mood and affect are normal. Speech and behavior are normal. Patient exhibits appropriate insight and judgment   ___________________________________________   LABS (all labs ordered are listed, but only abnormal results are displayed)  Labs Reviewed - No data to display   PROCEDURES Procedures   Foreign body removal Procedure explained  and verbal consent obtained. Consent: Verbal consent obtained. Written consent not obtained. Risks and benefits: risks, benefits and alternatives were discussed including retained product or infection. Patient identity confirmed: verbally with patient and hospital-assigned identification number  Consent given by: patient   For body removal Location: Right index finger Area cleaned and prepped with Betadine. Anesthesia: None Sterile alligator forceps utilized to grasp foreign body and removed with traction.  A foreign body appeared to be intact with removal.  No visible retained foreign body noted.  Area cleaned, dressing applied.  Patient tolerated well.  Wound care instructions provided.  Observe for any signs of infection or other problems.      INITIAL IMPRESSION / ASSESSMENT AND PLAN / ED COURSE  Pertinent labs & imaging results that were available during my care of the patient were reviewed by me and considered in my medical decision making (see chart for details).  Well-appearing patient.  Right index finger splinter.  Cleaned and removed.  Tetanus immunization is up-to-date per patient.  Will empirically  treat with Keflex and topical Bactroban.  Encourage warm soapy water soaks, monitoring keeping clean.Discussed indication, risks and benefits of medications with patient.   Discussed follow up with Primary care physician this week as needed. Discussed follow up and return parameters including no resolution or any worsening concerns. Patient verbalized understanding and agreed to plan.   ____________________________________________   FINAL CLINICAL IMPRESSION(S) / ED DIAGNOSES  Final diagnoses:  Splinter of finger     ED Discharge Orders         Ordered    cephALEXin (KEFLEX) 500 MG capsule  3 times daily     08/09/19 1304    mupirocin ointment (BACTROBAN) 2 %     08/09/19 1304           Note: This dictation was prepared with Dragon dictation along with smaller phrase technology. Any transcriptional errors that result from this process are unintentional.         Marylene Land, NP 08/09/19 1420

## 2019-08-09 NOTE — Discharge Instructions (Addendum)
Take medication as prescribed. Warm soapy soaks. Keep clean. Monitor.   Follow up with your primary care physician this week as needed. Return to Urgent care for new or worsening concerns.

## 2019-08-09 NOTE — Progress Notes (Signed)
Patient's blood pressure reading was 147/105. I consulted with the doctor of the day, Dr. Caryl Comes. Dr.Klein sent a message to the PCP, Dr. [name], and the patient was discharged.

## 2019-08-09 NOTE — ED Triage Notes (Signed)
Pt has a splinter under his right index finger. This occurred today about an hour ago.

## 2019-08-10 DIAGNOSIS — Z01812 Encounter for preprocedural laboratory examination: Secondary | ICD-10-CM | POA: Diagnosis not present

## 2019-08-11 LAB — BASIC METABOLIC PANEL
BUN/Creatinine Ratio: 18 (ref 9–20)
BUN: 18 mg/dL (ref 6–24)
CO2: 21 mmol/L (ref 20–29)
Calcium: 9.8 mg/dL (ref 8.7–10.2)
Chloride: 101 mmol/L (ref 96–106)
Creatinine, Ser: 1 mg/dL (ref 0.76–1.27)
GFR calc Af Amer: 105 mL/min/{1.73_m2} (ref >59)
GFR calc non Af Amer: 90 mL/min/{1.73_m2} (ref >59)
Glucose: 151 mg/dL — ABNORMAL HIGH (ref 65–99)
Potassium: 4.4 mmol/L (ref 3.5–5.2)
Sodium: 138 mmol/L (ref 134–144)

## 2019-08-13 ENCOUNTER — Telehealth (HOSPITAL_COMMUNITY): Payer: Self-pay | Admitting: Emergency Medicine

## 2019-08-13 NOTE — Telephone Encounter (Signed)
Reaching out to patient to offer assistance regarding upcoming cardiac imaging study; pt verbalizes understanding of appt date/time, parking situation and where to check in, pre-test NPO status and medications ordered, and verified current allergies; name and call back number provided for further questions should they arise Cleburne Savini RN Navigator Cardiac Imaging [facility] [phone] office [phone] cell 

## 2019-08-16 ENCOUNTER — Ambulatory Visit (HOSPITAL_COMMUNITY)
Admission: RE | Admit: 2019-08-16 | Discharge: 2019-08-16 | Disposition: A | Discharge status: Home / Self Care | Payer: 59 | Source: Ambulatory Visit | Attending: Cardiology | Admitting: Cardiology

## 2019-08-16 ENCOUNTER — Other Ambulatory Visit: Payer: Self-pay

## 2019-08-16 DIAGNOSIS — R072 Precordial pain: Secondary | ICD-10-CM

## 2019-08-16 MED ORDER — NITROGLYCERIN 0.4 MG SL SUBL
0.8000 mg | SUBLINGUAL_TABLET | Freq: Once | SUBLINGUAL | Status: AC
  Administered 2019-08-16: 0.8 mg via SUBLINGUAL

## 2019-08-16 MED ORDER — IOHEXOL 350 MG/ML SOLN
90.0000 mL | Freq: Once | INTRAVENOUS | Status: AC | PRN
  Administered 2019-08-16: 90 mL via INTRAVENOUS

## 2019-08-16 MED ORDER — NITROGLYCERIN 0.4 MG SL SUBL
SUBLINGUAL_TABLET | SUBLINGUAL | Status: AC
  Filled 2019-08-16: qty 2

## 2019-08-17 ENCOUNTER — Ambulatory Visit (HOSPITAL_COMMUNITY)
Admission: RE | Admit: 2019-08-17 | Discharge: 2019-08-17 | Disposition: A | Discharge status: Home / Self Care | Payer: 59 | Source: Ambulatory Visit | Attending: Cardiology | Admitting: Cardiology

## 2019-08-17 DIAGNOSIS — R072 Precordial pain: Secondary | ICD-10-CM | POA: Diagnosis not present

## 2019-08-18 ENCOUNTER — Telehealth: Payer: Self-pay | Admitting: Cardiology

## 2019-08-18 NOTE — Telephone Encounter (Signed)
Lm to call back ./cy 

## 2019-08-18 NOTE — Telephone Encounter (Signed)
Pt's wife aware due to COVID  Will not be allowed to come to husbands appt ./cy

## 2019-08-18 NOTE — Telephone Encounter (Signed)
No msg needed.

## 2019-08-18 NOTE — Telephone Encounter (Signed)
New message:     Patient wife would like to come to appt with him, because he do not ask questions and she would like for some one to call.

## 2019-08-20 ENCOUNTER — Encounter (INDEPENDENT_AMBULATORY_CARE_PROVIDER_SITE_OTHER): Payer: Self-pay | Admitting: Physician Assistant

## 2019-08-24 ENCOUNTER — Ambulatory Visit (INDEPENDENT_AMBULATORY_CARE_PROVIDER_SITE_OTHER): Payer: 59 | Admitting: Family Medicine

## 2019-08-24 NOTE — Progress Notes (Signed)
Cardiology Office Note:    Date:  08/25/2019   ID:  [name], DOB 1976/01/26, MRN [id number]  PCP:  Olin Hauser, DO  Cardiologist:  No primary care provider on file.  Electrophysiologist:  None   Referring MD: [name] *   Chief Complaint  Patient presents with  . Follow-up    DISCUSS CT    History of Present Illness:    [name] is a 43 y.o. male with a hx of type 2 diabetes, hypertension, nonobstructive CAD who presents for follow-up.   He saw Dr. Tamala Julian for chest pain around 2010 and had a exercise treadmill test that was negative.  He was initially seen on 07/28/2019 for exertional chest pain chest pain.  Coronary CTA was done on 08/17/2019, which showed nonobstructive CAD (noncalcified plaque in the proximal LAD causing 25 to 49% stenosis, CT FFR 0.9; calcium score 0).  TTE 08/09/2019 showed normal systolic function.  He reports that he continues to get chest pain when he overexerts himself.  States that BP has been up to 140s over 110s when he checks at home.   Past Medical History:  Diagnosis Date  . Chest pain   . Family history of adverse reaction to anesthesia    MOM-N/V, DAD-HAS PROBLEMS WITH NOVICAINE  . Food allergy   . GERD (gastroesophageal reflux disease)   . History of hiatal hernia   . History of kidney stones    H/O  . Insomnia   . Joint pain   . Plantar fasciitis   . Seasonal allergies   . Tendonitis of foot    left    Past Surgical History:  Procedure Laterality Date  . CARPAL TUNNEL RELEASE Right 07/03/2017   Procedure: CARPAL TUNNEL RELEASE;  Surgeon: Hessie Knows, MD;  Location: [facility] ORS;  Service: Orthopedics;  Laterality: Right;  . clavide surgery  2005  . CYSTOSCOPY/URETEROSCOPY/HOLMIUM LASER/STENT PLACEMENT Right 05/22/2018   Procedure: CYSTOSCOPY/URETEROSCOPY/HOLMIUM LASER/STENT PLACEMENT;  Surgeon: Billey Co, MD;  Location: [facility] ORS;  Service: Urology;  Laterality: Right;  . KNEE SURGERY Right  2013  . SHOULDER SURGERY Right 2008    Current Medications: No outpatient medications have been marked as taking for the 08/25/19 encounter (Office Visit) with Donato Heinz, MD.     Allergies:   Tomato   Social History   Socioeconomic History  . Marital status: Married    Spouse name: Amy Delon  . Number of children: 1  . Years of education: Not on file  . Highest education level: Not on file  Occupational History  . 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 Financial resource strain: Not on file  . Food insecurity    Worry: Not on file    Inability: Not on file  . Transportation needs    Medical: Not on file    Non-medical: Not on file  Tobacco Use  . Smoking status: Never Smoker  . Smokeless tobacco: Never Used  Substance and Sexual Activity  . Alcohol use: Yes    Alcohol/week: 0.0 standard drinks    Comment: RARE  . Drug use: No  . Sexual activity: Yes  Lifestyle  . Physical activity    Days per week: Not on file    Minutes per session: Not on file  . Stress: Not on file  Relationships  . Social connections    Talks on phone: Not on file  Gets together: Not on file    Attends religious service: Not on file    Active member of club or organization: Not on file    Attends meetings of clubs or organizations: Not on file    Relationship status: Not on file  Other Topics Concern  . Not on file  Social History Narrative  . Not on file     Family History: The patient's family history includes Alcoholism in his father; Cancer in his brother, maternal grandfather, and mother; Diabetes in his father; Heart disease (age of onset: 24) in his father; Hyperlipidemia in his father and mother; Hypertension in his father; Stroke in his maternal grandfather.  ROS:   Please see the history of present illness.    All other systems reviewed and are negative.  EKGs/Labs/Other Studies Reviewed:     The following studies were reviewed today:   EKG:  EKG is ordered today.  The ekg ordered today demonstrates normal sinus rhythm, rate 85, no ST/T abnormalities  Coronary CTA 08/17/19: 1. Coronary calcium score of 0. This was 0 percentile for age and sex matched control. 2.  Normal coronary origin with right dominance. 3. Nonobstructive CAD, with noncalcified plaque causing mild stenosis (25-49%) in the proximal LAD 4. There is a step artifact in the RPLB that precludes interpretation of a portion of the vessel CAD-RADS 2. Mild non-obstructive CAD (25-49%). Consider non-atherosclerotic causes of chest pain. Consider preventive therapy and risk factor modification.  CTFFR 08/17/19: 1. CT-FFR across lesion in proximal LAD is 0.9, suggesting lesion is not functionally significant  TTE 08/09/19:  1. Left ventricular ejection fraction, by visual estimation, is 55 to 60%. The left ventricle has normal function. Left ventricular septal wall thickness was mildly increased. Mildly increased left ventricular posterior wall thickness. There is mildly  increased left ventricular hypertrophy.  2. Left ventricular diastolic parameters are consistent with Grade I diastolic dysfunction (impaired relaxation).  3. Global right ventricle has normal systolic function.The right ventricular size is normal. No increase in right ventricular wall thickness.  4. Left atrial size was normal.  5. Right atrial size was normal.  6. The mitral valve is normal in structure. No evidence of mitral valve regurgitation. No evidence of mitral stenosis.  7. The tricuspid valve is normal in structure. Tricuspid valve regurgitation is trivial.  8. The aortic valve is tricuspid. Aortic valve regurgitation is not visualized. No evidence of aortic valve sclerosis or stenosis.  9. The pulmonic valve was normal in structure. Pulmonic valve regurgitation is not visualized. 10. Normal pulmonary artery systolic pressure. 11. The  inferior vena cava is normal in size with greater than 50% respiratory variability, suggesting right atrial pressure of 3 mmHg.  Recent Labs: 06/28/2019: ALT 28; Hemoglobin 14.9; Platelets 293 08/10/2019: BUN 18; Creatinine, Ser 1.00; Potassium 4.4; Sodium 138  Recent Lipid Panel    Component Value Date/Time   CHOL 170 06/28/2019 0818   CHOL 176 06/22/2019 0830   TRIG 109 06/28/2019 0818   HDL 61 06/28/2019 0818   HDL 58 06/22/2019 0830   CHOLHDL 2.8 06/28/2019 0818   LDLCALC 88 06/28/2019 0818    Physical Exam:    VS:  BP (!) 140/93 (BP Location: Left Arm, Patient Position: Sitting, Cuff Size: Large)   Pulse (!) 101   Ht 6\' 1"  (1.854 m)   Wt 263 lb (119.3 kg)   BMI 34.70 kg/m     Wt Readings from Last 3 Encounters:  08/25/19 263 lb (119.3 kg)  08/09/19  258 lb (117 kg)  07/28/19 257 lb 3.2 oz (116.7 kg)     GEN:  Well nourished, well developed in no acute distress HEENT: Normal NECK: No JVD; No carotid bruits LYMPHATICS: No lymphadenopathy CARDIAC: RRR, no murmurs, rubs, gallops RESPIRATORY:  Clear to auscultation without rales, wheezing or rhonchi  ABDOMEN: Soft, non-tender, non-distended MUSCULOSKELETAL:  No edema; No deformity  SKIN: Warm and dry NEUROLOGIC:  Alert and oriented x 3 PSYCHIATRIC:  Normal affect   ASSESSMENT:    1. Coronary artery disease involving native coronary artery of native heart, angina presence unspecified   2. Essential hypertension   3. Hyperlipidemia, unspecified hyperlipidemia type    PLAN:    In order of problems listed above:  Nonobstructive CAD: Coronary CTA 08/17/2019 shows noncalcified plaque in the proximal LAD causing mild (25 to 49%) stenosis.  CT FFR 0.90.  Calcium score 0.  Normal LV systolic function.  No obstructive epicardial coronary artery disease, but given typical chest pain in diabetic with nonobstructive CAD, chest pain could be due to microvascular disease.  No proven therapies for microvascular disease, but there  is evidence that exercise and risk factor modification are beneficial.  Encourage patient to exercise regularly.  Will titrate up antihypertensive as below -Given diabetes history, meets indication for statin.  LDL 88 on 06/28/19, started on atorvastatin 20 mg daily  Hypertension: On valsartan-HCTZ 160-12.5 mg daily.  BP elevated.  Will increase dose to 320-25 mg daily.  HLD: LDL 88 on 06/28/19, will start atorvastatin 20 mg daily as above  Type 2 diabetes: on metformin.  Last A1c 7.0 06/28/19   RTC in 6 months   Medication Adjustments/Labs and Tests Ordered: Current medicines are reviewed at length with the patient today.  Concerns regarding medicines are outlined above.  Orders Placed This Encounter  Procedures  . BMET   Meds ordered this encounter  Medications  . valsartan-hydrochlorothiazide (DIOVAN-HCT) 320-25 MG tablet    Sig: Take 1 tablet by mouth daily.    Dispense:  30 tablet    Refill:  6    Patient Instructions  Medication Instructions:  INCREASE VALSARTAN-HCTZ 320/25MG  DAILY If you need a refill on your cardiac medications before your next appointment, please call your pharmacy.  Labwork: BMET IN 1 WEEK (09-01-2019) HERE IN OUR OFFICE AT [facility]    You will NOT need to fast   If you have labs (blood work) drawn today and your tests are completely normal, you will receive your results only by: [name] MyChart Message (if you have MyChart) OR . A paper copy in the mail If you have any lab test that is abnormal or we need to change your treatment, we will call you to review the results.  Special Instructions: TAKE AND LOG YOUR BLOOD PRESSURE DAILY-CALL us WITH NUMBER IN 2 WEEKS AND BRING LOG WITH YOU TO YOUR APPOINTMENTS  Follow-Up: IN 6 months Please call our office 2 months in advance, [facility] 2021 to schedule this June 2021 appointment. In [place].    At [facility], you and your health needs are our priority.  As part of our continuing mission to  provide you with exceptional heart care, we have created designated Provider Care Teams.  These Care Teams include your primary Cardiologist (physician) and Advanced Practice Providers (APPs -  Physician Assistants and Nurse Practitioners) who all work together to provide you with the care you need, when you need it.  Thank you for choosing [facility] at [facility]!!  Happy Holidays!!        Signed, Donato Heinz, MD  08/25/2019 3:23 PM    [place]

## 2019-08-25 ENCOUNTER — Other Ambulatory Visit: Payer: Self-pay

## 2019-08-25 ENCOUNTER — Ambulatory Visit (INDEPENDENT_AMBULATORY_CARE_PROVIDER_SITE_OTHER): Payer: 59 | Admitting: Cardiology

## 2019-08-25 VITALS — BP 140/93 | HR 101 | Ht 73.0 in | Wt 263.0 lb

## 2019-08-25 DIAGNOSIS — E785 Hyperlipidemia, unspecified: Secondary | ICD-10-CM | POA: Diagnosis not present

## 2019-08-25 DIAGNOSIS — I2510 Atherosclerotic heart disease of native coronary artery without angina pectoris: Secondary | ICD-10-CM

## 2019-08-25 DIAGNOSIS — I10 Essential (primary) hypertension: Secondary | ICD-10-CM

## 2019-08-25 MED ORDER — VALSARTAN-HYDROCHLOROTHIAZIDE 320-25 MG PO TABS: 1.0000 | ORAL_TABLET | Freq: Every day | ORAL | 6 refills | Status: DC

## 2019-08-25 NOTE — Patient Instructions (Signed)
Medication Instructions:  INCREASE VALSARTAN-HCTZ 320/25MG  DAILY If you need a refill on your cardiac medications before your next appointment, please call your pharmacy.  Labwork: BMET IN 1 WEEK (09-01-2019) HERE IN OUR OFFICE AT [facility]    You will NOT need to fast   If you have labs (blood work) drawn today and your tests are completely normal, you will receive your results only by: [name] MyChart Message (if you have MyChart) OR . A paper copy in the mail If you have any lab test that is abnormal or we need to change your treatment, we will call you to review the results.  Special Instructions: TAKE AND LOG YOUR BLOOD PRESSURE DAILY-CALL us WITH NUMBER IN 2 WEEKS AND BRING LOG WITH YOU TO YOUR APPOINTMENTS  Follow-Up: IN 6 months Please call our office 2 months in advance, [facility] 2021 to schedule this June 2021 appointment. In [place].    At [facility], you and your health needs are our priority.  As part of our continuing mission to provide you with exceptional heart care, we have created designated Provider Care Teams.  These Care Teams include your primary Cardiologist (physician) and Advanced Practice Providers (APPs -  Physician Assistants and Nurse Practitioners) who all work together to provide you with the care you need, when you need it.  Thank you for choosing [facility] at [facility]!!             Happy Holidays!!

## 2019-09-02 ENCOUNTER — Ambulatory Visit (INDEPENDENT_AMBULATORY_CARE_PROVIDER_SITE_OTHER): Payer: 59 | Admitting: Physician Assistant

## 2019-09-02 ENCOUNTER — Other Ambulatory Visit: Payer: Self-pay

## 2019-09-02 ENCOUNTER — Encounter (INDEPENDENT_AMBULATORY_CARE_PROVIDER_SITE_OTHER): Payer: Self-pay | Admitting: Physician Assistant

## 2019-09-02 VITALS — BP 117/84 | HR 91 | Temp 98.4°F | Ht 73.0 in | Wt 254.0 lb

## 2019-09-02 DIAGNOSIS — Z6833 Body mass index (BMI) 33.0-33.9, adult: Secondary | ICD-10-CM

## 2019-09-02 DIAGNOSIS — E559 Vitamin D deficiency, unspecified: Secondary | ICD-10-CM | POA: Diagnosis not present

## 2019-09-02 DIAGNOSIS — E669 Obesity, unspecified: Secondary | ICD-10-CM

## 2019-09-02 DIAGNOSIS — E119 Type 2 diabetes mellitus without complications: Secondary | ICD-10-CM | POA: Diagnosis not present

## 2019-09-07 NOTE — Progress Notes (Signed)
Office: [phone]  /  Fax: [phone]   HPI:  Chief Complaint: OBESITY Raheel is here to discuss his progress with his obesity treatment plan. He is on the Category 4 plan and states he is following his eating plan approximately 60 % of the time. He states he is walking 20 to 30 minutes 2 times per week.  Ferlin reports that he is having trouble getting all of his food in during the day with his busy work schedule. He is not getting all of his protein at dinner.  Diabetes II Raphel has a diagnosis of diabetes type II. He is on metformin. Tam has no nausea, vomiting or diarrhea. [place] states fasting BGs range between 100 and 110. He denies hypoglycemia or polyphagia.  Vitamin D deficiency Timoth has a diagnosis of vitamin D deficiency. He is on vitamin D currently. Christpher has no nausea, vomiting or muscle weakness. [place] walks outside two times per week.  Today's visit was # 31  Starting weight: 249 lbs Starting date: 01/28/2018 Today's weight : 254 lbs Today's date: 09/02/2019 Total lbs lost to date: 0 Total lbs lost since last in-office visit: 1  ASSESSMENT AND PLAN:  Type 2 diabetes mellitus without complication, without long-term current use of insulin (HCC)  Vitamin D deficiency  Class 1 obesity with serious comorbidity and body mass index (BMI) of 33.0 to 33.9 in adult, unspecified obesity type  PLAN:  Diabetes II Halim has been given diabetes education by myself today. Good blood sugar control is important to decrease the likelihood of diabetic complications such as nephropathy, neuropathy, limb loss, blindness, coronary artery disease, and death. Intensive lifestyle modification including diet, exercise and weight loss were discussed as the first line treatment for diabetes. Elgin will continue with metformin and weight loss and he will follow up as directed.  Vitamin D Deficiency Low vitamin D level contributes to fatigue and are associated with obesity,  breast, and colon cancer. Callum will continue to take prescription Vit D @50 ,000 IU twice weekly and he will follow up for routine testing of vitamin D, at least 2-3 times per year to avoid over-replacement.  Obesity Antwand is currently in the action stage of change. As such, his goal is to continue with weight loss efforts He has agreed to follow the Category 4 plan Boen has been instructed to work up to a goal of 150 minutes of combined cardio and strengthening exercise per week for weight loss and overall health benefits. We discussed the following Behavioral Modification Strategies today: keeping healthy foods in the home and work on meal planning and easy cooking plans  Quavon has agreed to follow up with our clinic in 3 weeks. He was informed of the importance of frequent follow up visits to maximize his success with intensive lifestyle modifications for his multiple health conditions.  I spent > than 50% of the 28 minute visit on counseling as documented in the note.    ALLERGIES: Allergies  Allergen Reactions  . Tomato Other (See Comments)    GI distress    MEDICATIONS: Current Outpatient Medications on File Prior to Visit  Medication Sig Dispense Refill  . atorvastatin (LIPITOR) 20 MG tablet Take 1 tablet (20 mg total) by mouth daily at 6 PM. 30 tablet 11  . DULoxetine (CYMBALTA) 60 MG capsule Take 60 mg by mouth daily.     [name] esomeprazole (NEXIUM) 40 MG capsule TAKE 1 CAPSULE BY MOUTH ONCE DAILY 90 capsule 1  . etodolac (LODINE) 500 MG  tablet TAKE 1 TABLET BY MOUTH TWICE DAILY AS NEEDED (MODERATE PAIN). 60 tablet 2  . ipratropium (ATROVENT) 0.06 % nasal spray Place 2 sprays into both nostrils 3 (three) times daily as needed for rhinitis. 15 mL 3  . metFORMIN (GLUCOPHAGE) 1000 MG tablet Take 1 tablet (1,000 mg total) by mouth 2 (two) times daily with a meal. 60 tablet 0  . Multiple Vitamin (MULTIVITAMIN) capsule Take 1 capsule by mouth daily.    . nitroGLYCERIN (NITROSTAT)  0.4 MG SL tablet Place 1 tablet (0.4 mg total) under the tongue every 5 (five) minutes as needed for chest pain. 25 tablet 11  . valsartan-hydrochlorothiazide (DIOVAN-HCT) 320-25 MG tablet Take 1 tablet by mouth daily. 30 tablet 6  . Vitamin D, Ergocalciferol, (DRISDOL) 1.25 MG (50000 UT) CAPS capsule Take 1 capsule (50,000 Units total) by mouth 2 (two) times a week. 10 capsule 0   No current facility-administered medications on file prior to visit.    PAST MEDICAL HISTORY: Past Medical History:  Diagnosis Date  . Chest pain   . Family history of adverse reaction to anesthesia    MOM-N/V, DAD-HAS PROBLEMS WITH NOVICAINE  . Food allergy   . GERD (gastroesophageal reflux disease)   . History of hiatal hernia   . History of kidney stones    H/O  . Insomnia   . Joint pain   . Plantar fasciitis   . Seasonal allergies   . Tendonitis of foot    left    PAST SURGICAL HISTORY: Past Surgical History:  Procedure Laterality Date  . CARPAL TUNNEL RELEASE Right 07/03/2017   Procedure: CARPAL TUNNEL RELEASE;  Surgeon: Hessie Knows, MD;  Location: [facility] ORS;  Service: Orthopedics;  Laterality: Right;  . clavide surgery  2005  . CYSTOSCOPY/URETEROSCOPY/HOLMIUM LASER/STENT PLACEMENT Right 05/22/2018   Procedure: CYSTOSCOPY/URETEROSCOPY/HOLMIUM LASER/STENT PLACEMENT;  Surgeon: Billey Co, MD;  Location: [facility] ORS;  Service: Urology;  Laterality: Right;  . KNEE SURGERY Right 2013  . SHOULDER SURGERY Right 2008    SOCIAL HISTORY: Social History   Tobacco Use  . Smoking status: Never Smoker  . Smokeless tobacco: Never Used  Substance Use Topics  . Alcohol use: Yes    Alcohol/week: 0.0 standard drinks    Comment: RARE  . Drug use: No    FAMILY HISTORY: Family History  Problem Relation Age of Onset  . Cancer Mother        colon/rectal  . Hyperlipidemia Mother   . Cancer Brother        h[place]kins  . Cancer Maternal Grandfather        lung  . Stroke Maternal Grandfather   .  Diabetes Father   . Hypertension Father   . Hyperlipidemia Father   . Heart disease Father 55  . Alcoholism Father     ROS: Review of Systems  Constitutional: Positive for weight loss.  Gastrointestinal: Negative for diarrhea, nausea and vomiting.  Musculoskeletal:       Negative for muscle weakness  Endo/Heme/Allergies:       Negative for hypoglyemia Negative for polyphagia    PHYSICAL EXAM: Blood pressure 117/84, pulse 91, temperature 98.4 F (36.9 C), temperature source Oral, height 6\' 1"  (1.854 m), weight 254 lb (115.2 kg), SpO2 98 %. Body mass index is 33.51 kg/m. Physical Exam Vitals reviewed.  Constitutional:      General: He is not in acute distress.    Appearance: Normal appearance. He is well-developed. He is obese.  Cardiovascular:  Rate and Rhythm: Normal rate.  Pulmonary:     Effort: Pulmonary effort is normal.  Musculoskeletal:        General: Normal range of motion.  Skin:    General: Skin is warm and dry.  Neurological:     Mental Status: He is alert and oriented to person, place, and time.  Psychiatric:        Mood and Affect: Mood normal.        Behavior: Behavior normal.     RECENT LABS AND TESTS: BMET    Component Value Date/Time   NA 138 08/10/2019 0939   NA 138 05/20/2013 0815   K 4.4 08/10/2019 0939   K 3.7 05/20/2013 0815   CL 101 08/10/2019 0939   CL 106 05/20/2013 0815   CO2 21 08/10/2019 0939   CO2 26 05/20/2013 0815   GLUCOSE 151 (H) 08/10/2019 0939   GLUCOSE 133 (H) 06/28/2019 0818   GLUCOSE 101 (H) 05/20/2013 0815   BUN 18 08/10/2019 0939   BUN 20 (H) 05/20/2013 0815   CREATININE 1.00 08/10/2019 0939   CREATININE 1.01 06/28/2019 0818   CALCIUM 9.8 08/10/2019 0939   CALCIUM 8.9 05/20/2013 0815   GFRNONAA 90 08/10/2019 0939   GFRNONAA 89 06/28/2019 0818   GFRAA 105 08/10/2019 0939   GFRAA 104 06/28/2019 0818   Lab Results  Component Value Date   HGBA1C 7.0 (H) 06/28/2019   HGBA1C 7.1 (H) 06/22/2019   HGBA1C 6.3  (H) 06/22/2018   HGBA1C 6.2 (H) 01/28/2018   HGBA1C 6.7 11/07/2017   Lab Results  Component Value Date   INSULIN 17.6 06/22/2019   INSULIN 11.6 05/13/2018   INSULIN 10.5 01/28/2018   CBC    Component Value Date/Time   WBC 6.5 06/28/2019 0818   RBC 5.33 06/28/2019 0818   HGB 14.9 06/28/2019 0818   HGB 14.6 01/28/2018 1007   HCT 44.5 06/28/2019 0818   HCT 43.5 01/28/2018 1007   PLT 293 06/28/2019 0818   PLT 250 06/23/2017 1104   MCV 83.5 06/28/2019 0818   MCV 84 01/28/2018 1007   MCV 84 11/05/2011 1606   MCH 28.0 06/28/2019 0818   MCHC 33.5 06/28/2019 0818   RDW 13.2 06/28/2019 0818   RDW 14.3 01/28/2018 1007   RDW 13.0 11/05/2011 1606   LYMPHSABS 1,528 06/28/2019 0818   LYMPHSABS 1.5 01/28/2018 1007   LYMPHSABS 1.8 11/05/2011 1606   MONOABS 0.9 04/12/2018 1820   MONOABS 0.6 11/05/2011 1606   EOSABS 429 06/28/2019 0818   EOSABS 0.2 01/28/2018 1007   EOSABS 0.1 11/05/2011 1606   BASOSABS 59 06/28/2019 0818   BASOSABS 0.0 01/28/2018 1007   BASOSABS 0.0 11/05/2011 1606   Iron/TIBC/Ferritin/ %Sat No results found for: IRON, TIBC, FERRITIN, IRONPCTSAT Lipid Panel     Component Value Date/Time   CHOL 170 06/28/2019 0818   CHOL 176 06/22/2019 0830   TRIG 109 06/28/2019 0818   HDL 61 06/28/2019 0818   HDL 58 06/22/2019 0830   CHOLHDL 2.8 06/28/2019 0818   LDLCALC 88 06/28/2019 0818   Hepatic Function Panel     Component Value Date/Time   PROT 7.0 06/28/2019 0818   PROT 6.9 06/22/2019 0830   PROT 8.5 (H) 11/05/2011 1606   ALBUMIN 4.8 06/22/2019 0830   ALBUMIN 4.5 11/05/2011 1606   AST 22 06/28/2019 0818   AST 36 11/05/2011 1606   ALT 28 06/28/2019 0818   ALT 53 11/05/2011 1606   ALKPHOS 75 06/22/2019 0830   ALKPHOS 147 (  H) 11/05/2011 1606   BILITOT 0.4 06/28/2019 0818   BILITOT 0.5 06/22/2019 0830   BILITOT 0.5 11/05/2011 1606      Component Value Date/Time   TSH 1.330 01/28/2018 1007   TSH 1.440 01/04/2016 0818     Ref. Range 06/22/2019 08:30    Vitamin D, 25-Hydroxy Latest Ref Range: 30.0 - 100.0 ng/mL 38.6    I, Doreene Nest, am acting as Location manager for [employer], PA-C I, Abby Potash, PA-C have reviewed above note and agree with its content

## 2019-09-27 ENCOUNTER — Telehealth (INDEPENDENT_AMBULATORY_CARE_PROVIDER_SITE_OTHER): Payer: 59 | Admitting: Physician Assistant

## 2019-09-27 ENCOUNTER — Encounter (INDEPENDENT_AMBULATORY_CARE_PROVIDER_SITE_OTHER): Payer: Self-pay | Admitting: Physician Assistant

## 2019-09-27 ENCOUNTER — Other Ambulatory Visit: Payer: Self-pay

## 2019-09-27 ENCOUNTER — Ambulatory Visit: Payer: 59 | Admitting: Cardiology

## 2019-09-27 DIAGNOSIS — E669 Obesity, unspecified: Secondary | ICD-10-CM

## 2019-09-27 DIAGNOSIS — Z6833 Body mass index (BMI) 33.0-33.9, adult: Secondary | ICD-10-CM

## 2019-09-27 DIAGNOSIS — E559 Vitamin D deficiency, unspecified: Secondary | ICD-10-CM

## 2019-09-27 MED ORDER — VITAMIN D (ERGOCALCIFEROL) 1.25 MG (50000 UNIT) PO CAPS: 50000.0000 [IU] | ORAL_CAPSULE | ORAL | 0 refills | Status: DC

## 2019-09-29 NOTE — Progress Notes (Signed)
TeleHealth Visit:  Due to the COVID-19 pandemic, this visit was completed with telemedicine (audio/video) technology to reduce patient and provider exposure as well as to preserve personal protective equipment.   LAGREGORY HORI has verbally consented to this TeleHealth visit. The patient is located at work, the provider is located at the [employer] [facility] office. The participants in this visit include the listed provider and patient and any and all parties involved. The visit was conducted today via telephone.  Colbi was unable to use realtime audiovisual technology today and the telehealth visit was conducted via telephone.  Chief Complaint: OBESITY NED C Folson is here to discuss his progress with his obesity treatment plan along with follow-up of his obesity related diagnoses. Poseidon is on the Category 4 Plan and states he is following his eating plan approximately 85% of the time. Youssif states he is walking 30 minutes 3 times per week.  Today's visit was #: 49 Starting weight: 249 lbs Starting date: 01/28/2018  Interim History: Ngoc's most recent weight is at 251 pounds (09/25/19). He states that he has done a better job eating all his protein at dinner and he is making sure to eat his lunch.  Subjective:   Vitamin D deficiency Melchizedek is on vitamin D twice weekly. His last vitamin D level was 38.6 (06/22/19). He denies nausea, vomiting or muscle weakness.  Assessment/Plan:   Vitamin D deficiency  Low Vitamin D level contributes to fatigue and are associated with obesity, breast, and colon cancer. He agrees to continue to take prescription Vitamin D @50 ,000 IU two times a week #10 with no refills and he will follow-up for routine testing of vitamin D, at least 2-3 times per year to avoid over-replacement.  Obesity Logun is currently in the action stage of change. As such, his goal is to continue with weight loss efforts. He has agreed to on the Category 4 Plan.   We  discussed the following exercise goals today: For substantial health benefits, adults should do at least 150 minutes (2 hours and 30 minutes) a week of moderate-intensity, or 75 minutes (1 hour and 15 minutes) a week of vigorous-intensity aerobic physical activity, or an equivalent combination of moderate- and vigorous-intensity aerobic activity. Aerobic activity should be performed in episodes of at least 10 minutes, and preferably, it should be spread throughout the week. Adults should also include muscle-strengthening activities that involve all major muscle groups on 2 or more days a week.  We discussed the following behavioral modification strategies today: meal planning and cooking strategies and planning for success.  NED C Kuk has agreed to follow-up with our clinic in 2 weeks. He was informed of the importance of frequent follow-up visits to maximize his success with intensive lifestyle modifications for his multiple health conditions.  Objective:   VITALS: Per patient if applicable, see vitals. GENERAL: Alert and in no acute distress. CARDIOPULMONARY: No increased WOB. Speaking in clear sentences.  PSYCH: Pleasant and cooperative. Speech normal rate and rhythm. Affect is appropriate. Insight and judgement are appropriate. Attention is focused, linear, and appropriate.  NEURO: Oriented as arrived to appointment on time with no prompting.   Lab Results  Component Value Date   CREATININE 1.00 08/10/2019   BUN 18 08/10/2019   NA 138 08/10/2019   K 4.4 08/10/2019   CL 101 08/10/2019   CO2 21 08/10/2019   Lab Results  Component Value Date   ALT 28 06/28/2019   AST 22 06/28/2019  ALKPHOS 75 06/22/2019   BILITOT 0.4 06/28/2019   Lab Results  Component Value Date   HGBA1C 7.0 (H) 06/28/2019   HGBA1C 7.1 (H) 06/22/2019   HGBA1C 6.3 (H) 06/22/2018   HGBA1C 6.2 (H) 01/28/2018   HGBA1C 6.7 11/07/2017   Lab Results  Component Value Date   INSULIN 17.6 06/22/2019   INSULIN  11.6 05/13/2018   INSULIN 10.5 01/28/2018   Lab Results  Component Value Date   TSH 1.330 01/28/2018   Lab Results  Component Value Date   CHOL 170 06/28/2019   HDL 61 06/28/2019   LDLCALC 88 06/28/2019   TRIG 109 06/28/2019   CHOLHDL 2.8 06/28/2019   Lab Results  Component Value Date   WBC 6.5 06/28/2019   HGB 14.9 06/28/2019   HCT 44.5 06/28/2019   MCV 83.5 06/28/2019   PLT 293 06/28/2019   No results found for: IRON, TIBC, FERRITIN    Ref. Range 06/22/2019 08:30  Vitamin D, 25-Hydroxy Latest Ref Range: 30.0 - 100.0 ng/mL 38.6    Attestation Statements:   Reviewed by clinician on day of visit: allergies, medications, problem list, medical history, surgical history, family history, social history, and previous encounter notes.  Time spent on visit including pre-visit chart review and post-visit care was 15 minutes.   Corey Skains, am acting as Location manager for [employer], PA-C.  I have reviewed the above documentation for accuracy and completeness, and I agree with the above. Abby Potash, PA-C

## 2019-10-11 ENCOUNTER — Telehealth (INDEPENDENT_AMBULATORY_CARE_PROVIDER_SITE_OTHER): Payer: 59 | Admitting: Physician Assistant

## 2019-10-11 ENCOUNTER — Other Ambulatory Visit: Payer: Self-pay

## 2019-10-11 ENCOUNTER — Encounter (INDEPENDENT_AMBULATORY_CARE_PROVIDER_SITE_OTHER): Payer: Self-pay | Admitting: Physician Assistant

## 2019-10-11 DIAGNOSIS — E669 Obesity, unspecified: Secondary | ICD-10-CM

## 2019-10-11 DIAGNOSIS — Z6833 Body mass index (BMI) 33.0-33.9, adult: Secondary | ICD-10-CM | POA: Diagnosis not present

## 2019-10-11 DIAGNOSIS — E119 Type 2 diabetes mellitus without complications: Secondary | ICD-10-CM

## 2019-10-11 NOTE — Progress Notes (Signed)
TeleHealth Visit:  Due to the COVID-19 pandemic, this visit was completed with telemedicine (audio/video) technology to reduce patient and provider exposure as well as to preserve personal protective equipment.   Makyle has verbally consented to this TeleHealth visit. The patient is located at home, the provider is located at the [employer] [facility] office. The participants in this visit include the listed provider and patient. The visit was conducted today via telephone.  Sameul was unable to use realtime audiovisual technology today and the telehealth visit was conducted via telephone.  Chief Complaint: OBESITY Kameel is here to discuss his progress with his obesity treatment plan along with follow-up of his obesity related diagnoses. Rayfus is on the Category 4 Plan and states he is following his eating plan approximately 60% of the time. Talan states he is exercising for 0 minutes 0 times per week.  Today's visit was #: 81 Starting weight: 249 lbs Starting date: 01/28/2018  Interim History: Kyndal's most recent weight was 256 pounds. He reports that he has been crazy with work and has been overeating or eating junk food at work.  Subjective:   1. Type 2 diabetes mellitus without complication, without long-term current use of insulin (HCC) Taige's fasting blood sugars have been ranging between 110 and 123.  He is taking metformin.  No nausea, vomiting, or diarrea.   Lab Results  Component Value Date   HGBA1C 7.0 (H) 06/28/2019   Assessment/Plan:   1. Type 2 diabetes mellitus without complication, without long-term current use of insulin (HCC) Continue metformin and weight loss.  2. Class 1 obesity with serious comorbidity and body mass index (BMI) of 33.0 to 33.9 in adult, unspecified obesity type Nareg is currently in the action stage of change. As such, his goal is to continue with weight loss efforts. He has agreed to the Category 4 Plan.   Exercise goals: For  substantial health benefits, adults should do at least 150 minutes (2 hours and 30 minutes) a week of moderate-intensity, or 75 minutes (1 hour and 15 minutes) a week of vigorous-intensity aerobic physical activity, or an equivalent combination of moderate- and vigorous-intensity aerobic activity. Aerobic activity should be performed in episodes of at least 10 minutes, and preferably, it should be spread throughout the week. Adults should also include muscle-strengthening activities that involve all major muscle groups on 2 or more days a week.  Behavioral modification strategies: increasing lean protein intake and meal planning and cooking strategies.  He should also increase his protein intake at breakfast.  Metin has agreed to follow-up with our clinic in 2-3 weeks. He was informed of the importance of frequent follow-up visits to maximize his success with intensive lifestyle modifications for his multiple health conditions.  Objective:   VITALS: Per patient if applicable, see vitals. GENERAL: Alert and in no acute distress. CARDIOPULMONARY: No increased WOB. Speaking in clear sentences.  PSYCH: Pleasant and cooperative. Speech normal rate and rhythm. Affect is appropriate. Insight and judgement are appropriate. Attention is focused, linear, and appropriate.  NEURO: Oriented as arrived to appointment on time with no prompting.   Lab Results  Component Value Date   CREATININE 1.00 08/10/2019   BUN 18 08/10/2019   NA 138 08/10/2019   K 4.4 08/10/2019   CL 101 08/10/2019   CO2 21 08/10/2019   Lab Results  Component Value Date   ALT 28 06/28/2019   AST 22 06/28/2019   ALKPHOS 75 06/22/2019   BILITOT 0.4 06/28/2019  Lab Results  Component Value Date   HGBA1C 7.0 (H) 06/28/2019   HGBA1C 7.1 (H) 06/22/2019   HGBA1C 6.3 (H) 06/22/2018   HGBA1C 6.2 (H) 01/28/2018   HGBA1C 6.7 11/07/2017   Lab Results  Component Value Date   INSULIN 17.6 06/22/2019   INSULIN 11.6 05/13/2018    INSULIN 10.5 01/28/2018   Lab Results  Component Value Date   TSH 1.330 01/28/2018   Lab Results  Component Value Date   CHOL 170 06/28/2019   HDL 61 06/28/2019   LDLCALC 88 06/28/2019   TRIG 109 06/28/2019   CHOLHDL 2.8 06/28/2019   Lab Results  Component Value Date   WBC 6.5 06/28/2019   HGB 14.9 06/28/2019   HCT 44.5 06/28/2019   MCV 83.5 06/28/2019   PLT 293 06/28/2019   Attestation Statements:   Reviewed by clinician on day of visit: allergies, medications, problem list, medical history, surgical history, family history, social history, and previous encounter notes.  Time spent on visit including pre-visit chart review and post-visit care was 20 minutes.   I, Water quality scientist, CMA, am acting as Location manager for [employer], PA-C.  I have reviewed the above documentation for accuracy and completeness, and I agree with the above. Abby Potash, PA-C

## 2019-10-25 ENCOUNTER — Telehealth (INDEPENDENT_AMBULATORY_CARE_PROVIDER_SITE_OTHER): Payer: 59 | Admitting: Physician Assistant

## 2019-10-25 ENCOUNTER — Encounter (INDEPENDENT_AMBULATORY_CARE_PROVIDER_SITE_OTHER): Payer: Self-pay | Admitting: Physician Assistant

## 2019-10-25 ENCOUNTER — Other Ambulatory Visit: Payer: Self-pay

## 2019-10-25 DIAGNOSIS — E7849 Other hyperlipidemia: Secondary | ICD-10-CM

## 2019-10-25 DIAGNOSIS — Z6833 Body mass index (BMI) 33.0-33.9, adult: Secondary | ICD-10-CM

## 2019-10-25 DIAGNOSIS — E669 Obesity, unspecified: Secondary | ICD-10-CM | POA: Diagnosis not present

## 2019-10-25 NOTE — Progress Notes (Signed)
TeleHealth Visit:  Due to the COVID-19 pandemic, this visit was completed with telemedicine (audio/video) technology to reduce patient and provider exposure as well as to preserve personal protective equipment.   AMAL PALMATIER has verbally consented to this TeleHealth visit. The patient is located at work, the provider is located at the [employer] [facility] office. The participants in this visit include the listed provider and patient. The visit was conducted today via telephone call.  Monterrio was unable to use realtime audiovisual technology today and the telehealth visit was conducted via telephone.  Chief Complaint: OBESITY Kamden is here to discuss his progress with his obesity treatment plan along with follow-up of his obesity related diagnoses. Huzaifa is on the Category 4 Plan and states he is following his eating plan approximately 60-70% of the time. Aurora states he is walking 20 minutes 3 times per week.  Today's visit was #: 95 Starting weight: 249 lbs Starting date: 01/28/2018  Interim History: Kaizer's most recent weight was 256 lbs. He reports that his work schedule is very busy and he is sometimes missing lunch.  Subjective:   Other hyperlipidemia. Benford is on atorvastatin. No chest pain. No myalgias.   Lab Results  Component Value Date   CHOL 170 06/28/2019   HDL 61 06/28/2019   LDLCALC 88 06/28/2019   TRIG 109 06/28/2019   CHOLHDL 2.8 06/28/2019   Lab Results  Component Value Date   ALT 28 06/28/2019   AST 22 06/28/2019   ALKPHOS 75 06/22/2019   BILITOT 0.4 06/28/2019   The ASCVD Risk score Mikey Bussing DC Jr., et al., 2013) failed to calculate for the following reasons:   Unable to determine if patient is Non-Hispanic African American  Assessment/Plan:   Other hyperlipidemia. Cardiovascular risk and specific lipid/LDL goals reviewed.  We discussed several lifestyle modifications today and Issack will continue to work on diet, exercise and weight loss  efforts. Orders and follow up as documented in patient record. He will continue his medications as prescribed.  Counseling Intensive lifestyle modifications are the first line treatment for this issue.  Dietary changes: Increase soluble fiber. Decrease simple carbohydrates.  Exercise changes: Moderate to vigorous-intensity aerobic activity 150 minutes per week if tolerated.  Lipid-lowering medications: see documented in medical record.  Class 1 obesity with serious comorbidity and body mass index (BMI) of 33.0 to 33.9 in adult, unspecified obesity type.  Buchanan is currently in the action stage of change. As such, his goal is to continue with weight loss efforts. He has agreed to the Category 4 Plan.   Exercise goals: For substantial health benefits, adults should do at least 150 minutes (2 hours and 30 minutes) a week of moderate-intensity, or 75 minutes (1 hour and 15 minutes) a week of vigorous-intensity aerobic physical activity, or an equivalent combination of moderate- and vigorous-intensity aerobic activity. Aerobic activity should be performed in episodes of at least 10 minutes, and preferably, it should be spread throughout the week.  Behavioral modification strategies: increasing lean protein intake and no skipping meals.  Ellwood has agreed to follow-up with our clinic in 2 weeks. He was informed of the importance of frequent follow-up visits to maximize his success with intensive lifestyle modifications for his multiple health conditions.  Objective:   VITALS: Per patient if applicable, see vitals. GENERAL: Alert and in no acute distress. CARDIOPULMONARY: No increased WOB. Speaking in clear sentences.  PSYCH: Pleasant and cooperative. Speech normal rate and rhythm. Affect is appropriate. Insight and judgement  are appropriate. Attention is focused, linear, and appropriate.  NEURO: Oriented as arrived to appointment on time with no prompting.   Lab Results  Component Value Date     CREATININE 1.00 08/10/2019   BUN 18 08/10/2019   NA 138 08/10/2019   K 4.4 08/10/2019   CL 101 08/10/2019   CO2 21 08/10/2019   Lab Results  Component Value Date   ALT 28 06/28/2019   AST 22 06/28/2019   ALKPHOS 75 06/22/2019   BILITOT 0.4 06/28/2019   Lab Results  Component Value Date   HGBA1C 7.0 (H) 06/28/2019   HGBA1C 7.1 (H) 06/22/2019   HGBA1C 6.3 (H) 06/22/2018   HGBA1C 6.2 (H) 01/28/2018   HGBA1C 6.7 11/07/2017   Lab Results  Component Value Date   INSULIN 17.6 06/22/2019   INSULIN 11.6 05/13/2018   INSULIN 10.5 01/28/2018   Lab Results  Component Value Date   TSH 1.330 01/28/2018   Lab Results  Component Value Date   CHOL 170 06/28/2019   HDL 61 06/28/2019   LDLCALC 88 06/28/2019   TRIG 109 06/28/2019   CHOLHDL 2.8 06/28/2019   Lab Results  Component Value Date   WBC 6.5 06/28/2019   HGB 14.9 06/28/2019   HCT 44.5 06/28/2019   MCV 83.5 06/28/2019   PLT 293 06/28/2019   No results found for: IRON, TIBC, FERRITIN  Attestation Statements:   Reviewed by clinician on day of visit: allergies, medications, problem list, medical history, surgical history, family history, social history, and previous encounter notes.  IMichaelene Song, am acting as transcriptionist for Abby Potash, PA-C   I have reviewed the above documentation for accuracy and completeness, and I agree with the above. Abby Potash, PA-C

## 2019-11-02 ENCOUNTER — Encounter: Payer: Self-pay | Admitting: Family Medicine

## 2019-11-02 ENCOUNTER — Ambulatory Visit (INDEPENDENT_AMBULATORY_CARE_PROVIDER_SITE_OTHER): Payer: 59 | Admitting: Family Medicine

## 2019-11-02 ENCOUNTER — Other Ambulatory Visit: Payer: Self-pay

## 2019-11-02 DIAGNOSIS — J3089 Other allergic rhinitis: Secondary | ICD-10-CM

## 2019-11-02 DIAGNOSIS — J0110 Acute frontal sinusitis, unspecified: Secondary | ICD-10-CM | POA: Diagnosis not present

## 2019-11-02 MED ORDER — AMOXICILLIN-POT CLAVULANATE 875-125 MG PO TABS: 1.0000 | ORAL_TABLET | Freq: Two times a day (BID) | ORAL | 0 refills | Status: DC

## 2019-11-02 MED ORDER — IPRATROPIUM BROMIDE 0.06 % NA SOLN: 2.0000 | Freq: Three times a day (TID) | NASAL | 3 refills | Status: DC | PRN

## 2019-11-02 NOTE — Progress Notes (Signed)
Virtual Visit via Telephone The purpose of this virtual visit is to provide medical care while limiting exposure to the novel coronavirus (COVID19) for both patient and office staff.  Consent was obtained for phone visit:  Yes.   Answered questions that patient had about telehealth interaction:  Yes.   I discussed the limitations, risks, security and privacy concerns of performing an evaluation and management service by telephone. I also discussed with the patient that there may be a patient responsible charge related to this service. The patient expressed understanding and agreed to proceed.  Patient Location: Home Provider Location: [facility])   ---------------------------------------------------------------------- Chief Complaint  Patient presents with  . Sinusitis    onset 6 days, fever on and off, mucus, cough    S: Reviewed CMA documentation. I have called patient and gathered additional HPI as follows:  SINUSITIS Reports that symptoms started about 6 days ago with sinus symptoms congestion, pressure and sinus pain, describe low grade temp off and on and using Tylenol Cold / Sinus and Alka Seltzer PRN, thicker mucus and sinus drainage changed to more yellow thicker drainage. - History prior positive COVID 04/2019, and this episode feels very different. - Previously improved on Azithromycin zpak, and atrovent nasal  Denies any high risk travel to areas of current concern for COVID19. Denies any known or suspected exposure to person with or possibly with COVID19.  Denies any chills, sweats, body ache, cough, shortness of breath, abdominal pain, diarrhea  -------------------------------------------------------------------------- O: No physical exam performed due to remote telephone encounter.  -------------------------------------------------------------------------- A&P:   Suspected Acute Sinusitis, possible for benign viral etiology at onset - now  concern with progression of symptoms, consider 2nd sickening and cannot rule out bacterial infection. - Reassuring without high risk symptoms - without dyspnea - No comorbid pulmonary conditions (asthma, COPD) or immunocompromise  Known prior COVID19 Positive infection 04/2019, resolved.  1. Start Augmentin BID x 10 days 2. Start Atrovent nasal spray decongestant 2 sprays in each nostril up to 4 times daily for 7 days 3. OTC sinus treatments as advised, add mucinex  No orders of the defined types were placed in this encounter.   OPTIONAL RECOMMENDED self quarantine for patient safety for PREVENTION ONLY. It is not required based on current clinical symptoms. If they were to develop fever or worsening shortness of breath, then emphasis on REQUIRED quarantine for up to 7-14 days that could be resolved if fever free >3 days AND if symptoms improving after 7 days.   If symptoms do not resolve or significantly improve OR if WORSENING - fever / cough - or worsening shortness of breath - then should contact us and seek advice on next steps in treatment at home vs where/when to seek care at Urgent Care or Hospital ED for further intervention and possible testing if indicated.  Patient verbalizes understanding with the above medical recommendations including the limitation of remote medical advice.  Specific follow-up / call-back criteria were given for patient to follow-up or seek medical care more urgently if needed.   - Time spent in direct consultation with patient on phone: 8 minutes  [name], [facility] 11/02/2019, 4:09 PM

## 2019-11-02 NOTE — Patient Instructions (Addendum)
.   It sounds like you have a Sinusitis (Bacterial Infection) - this most likely started as an Upper Respiratory Virus that has settled into an infection. Allergies can also cause this. - Start Augmentin 1 pill twice daily (breakfast and dinner, with food and plenty of water) for 10 days, complete entire course, do not stop early even if feeling better - Start Atrovent nasal spray decongestant 2 sprays in each nostril up to 4 times daily for 7 days  - Recommend to start using Nasal Saline spray multiple times a day to help flush out congestion and clear sinuses - Improve hydration by drinking plenty of clear fluids (water, gatorade) to reduce secretions and thin congestion - Congestion draining down throat can cause irritation. May try warm herbal tea with honey, cough drops - Can take Tylenol or Ibuprofen as needed for fevers - May continue over the counter cold medicine as you are, I would not use any decongestant or mucinex longer than 7 days.  If you develop persistent fever >101F for at least 3 consecutive days, headaches with sinus pain or pressure or persistent earache, please schedule a follow-up evaluation within next few days to week.   Please schedule a Follow-up Appointment to: Return in about 2 weeks (around 11/16/2019), or if symptoms worsen or fail to improve, for sinusitis.  If you have any other questions or concerns, please feel free to call the office or send a message through [facility]. You may also schedule an earlier appointment if necessary.  Additionally, you may be receiving a survey about your experience at our office within a few days to 1 week by e-mail or mail. We value your feedback.  [name], DO [facility]

## 2019-11-08 ENCOUNTER — Telehealth (INDEPENDENT_AMBULATORY_CARE_PROVIDER_SITE_OTHER): Payer: 59 | Admitting: Physician Assistant

## 2019-11-08 ENCOUNTER — Encounter (INDEPENDENT_AMBULATORY_CARE_PROVIDER_SITE_OTHER): Payer: Self-pay | Admitting: Physician Assistant

## 2019-11-08 ENCOUNTER — Other Ambulatory Visit: Payer: Self-pay

## 2019-11-08 DIAGNOSIS — E559 Vitamin D deficiency, unspecified: Secondary | ICD-10-CM

## 2019-11-08 DIAGNOSIS — Z6833 Body mass index (BMI) 33.0-33.9, adult: Secondary | ICD-10-CM | POA: Diagnosis not present

## 2019-11-08 DIAGNOSIS — E669 Obesity, unspecified: Secondary | ICD-10-CM | POA: Diagnosis not present

## 2019-11-08 DIAGNOSIS — E119 Type 2 diabetes mellitus without complications: Secondary | ICD-10-CM

## 2019-11-08 MED ORDER — VITAMIN D (ERGOCALCIFEROL) 1.25 MG (50000 UNIT) PO CAPS: 50000.0000 [IU] | ORAL_CAPSULE | ORAL | 0 refills | Status: DC

## 2019-11-08 NOTE — Progress Notes (Signed)
TeleHealth Visit:  Due to the COVID-19 pandemic, this visit was completed with telemedicine (audio/video) technology to reduce patient and provider exposure as well as to preserve personal protective equipment.   [name] has verbally consented to this TeleHealth visit. The patient is located at work, the provider is located at the [facility] office. The participants in this visit include the listed provider and patient. The visit was conducted today via telephone call.  [place] was unable to use realtime audiovisual technology today and the telehealth visit was conducted via telephone.  Chief Complaint: OBESITY [name] is here to discuss his progress with his obesity treatment plan along with follow-up of his obesity related diagnoses. [name] is on the Category 4 Plan and states he is following his eating plan approximately 90% of the time. [name] states he is walking for 20-30 minutes 2-3 times per week.  Today's visit was #: 10 Starting weight: 249 lbs Starting date: 01/28/2018  Interim History: [name] reports that he has done a better job getting in more protein during breakfast and eating lunch more regularly.  Subjective:   1. Vitamin D deficiency [name]'s Vitamin D level was 38.6 on 06/22/2019. He is currently taking vit D. He denies nausea, vomiting or muscle weakness.  2. Type 2 diabetes mellitus without complication, without long-term current use of insulin (HCC) Fasting blood sugar is between 85-100.  He is taking metformin 1000 mg twice daily.  No nausea, vomiting, or diarrhea.  No hypoglycemia.  Lab Results  Component Value Date   HGBA1C 7.0 (H) 06/28/2019   HGBA1C 7.1 (H) 06/22/2019   HGBA1C 6.3 (H) 06/22/2018   Lab Results  Component Value Date   LDLCALC 88 06/28/2019   CREATININE 1.00 08/10/2019   Lab Results  Component Value Date   INSULIN 17.6 06/22/2019   INSULIN 11.6 05/13/2018   INSULIN 10.5 01/28/2018   Assessment/Plan:   1. Vitamin D  deficiency Low Vitamin D level contributes to fatigue and are associated with obesity, breast, and colon cancer. He agrees to continue to take prescription Vitamin D @50 ,000 IU every week and will follow-up for routine testing of Vitamin D, at least 2-3 times per year to avoid over-replacement. - Vitamin D, Ergocalciferol, (DRISDOL) 1.25 MG (50000 UNIT) CAPS capsule; Take 1 capsule (50,000 Units total) by mouth 2 (two) times a week.  Dispense: 10 capsule; Refill: 0  2. Type 2 diabetes mellitus without complication, without long-term current use of insulin (HCC) Good blood sugar control is important to decrease the likelihood of diabetic complications such as nephropathy, neuropathy, limb loss, blindness, coronary artery disease, and death. Intensive lifestyle modification including diet, exercise and weight loss are the first line of treatment for diabetes.   3. Class 1 obesity with serious comorbidity and body mass index (BMI) of 33.0 to 33.9 in adult, unspecified obesity type [name] is currently in the action stage of change. As such, his goal is to continue with weight loss efforts. He has agreed to the Category 4 Plan.   Exercise goals: As is.  Behavioral modification strategies: meal planning and cooking strategies and keeping healthy foods in the home.  [name] has agreed to follow-up with our clinic in 3 weeks. He was informed of the importance of frequent follow-up visits to maximize his success with intensive lifestyle modifications for his multiple health conditions.  Objective:   VITALS: Per patient if applicable, see vitals. GENERAL: Alert and in no acute distress. CARDIOPULMONARY: No increased WOB. Speaking in clear sentences.  PSYCH: Pleasant and cooperative. Speech normal rate and rhythm. Affect is appropriate. Insight and judgement are appropriate. Attention is focused, linear, and appropriate.  NEURO: Oriented as arrived to appointment on time with no prompting.   Lab Results    Component Value Date   CREATININE 1.00 08/10/2019   BUN 18 08/10/2019   NA 138 08/10/2019   K 4.4 08/10/2019   CL 101 08/10/2019   CO2 21 08/10/2019   Lab Results  Component Value Date   ALT 28 06/28/2019   AST 22 06/28/2019   ALKPHOS 75 06/22/2019   BILITOT 0.4 06/28/2019   Lab Results  Component Value Date   HGBA1C 7.0 (H) 06/28/2019   HGBA1C 7.1 (H) 06/22/2019   HGBA1C 6.3 (H) 06/22/2018   HGBA1C 6.2 (H) 01/28/2018   HGBA1C 6.7 11/07/2017   Lab Results  Component Value Date   INSULIN 17.6 06/22/2019   INSULIN 11.6 05/13/2018   INSULIN 10.5 01/28/2018   Lab Results  Component Value Date   TSH 1.330 01/28/2018   Lab Results  Component Value Date   CHOL 170 06/28/2019   HDL 61 06/28/2019   LDLCALC 88 06/28/2019   TRIG 109 06/28/2019   CHOLHDL 2.8 06/28/2019   Lab Results  Component Value Date   WBC 6.5 06/28/2019   HGB 14.9 06/28/2019   HCT 44.5 06/28/2019   MCV 83.5 06/28/2019   PLT 293 06/28/2019   Attestation Statements:   Reviewed by clinician on day of visit: allergies, medications, problem list, medical history, surgical history, family history, social history, and previous encounter notes.  I, Water quality scientist, CMA, am acting as Location manager for [employer], PA-C.  I have reviewed the above documentation for accuracy and completeness, and I agree with the above. Abby Potash, PA-C

## 2019-11-09 ENCOUNTER — Other Ambulatory Visit: Payer: Self-pay | Admitting: Family Medicine

## 2019-11-09 DIAGNOSIS — M1990 Unspecified osteoarthritis, unspecified site: Secondary | ICD-10-CM

## 2019-11-22 ENCOUNTER — Telehealth (INDEPENDENT_AMBULATORY_CARE_PROVIDER_SITE_OTHER): Payer: 59 | Admitting: Physician Assistant

## 2019-11-24 DIAGNOSIS — E119 Type 2 diabetes mellitus without complications: Secondary | ICD-10-CM | POA: Diagnosis not present

## 2019-11-24 DIAGNOSIS — I10 Essential (primary) hypertension: Secondary | ICD-10-CM | POA: Diagnosis not present

## 2019-11-25 ENCOUNTER — Encounter (INDEPENDENT_AMBULATORY_CARE_PROVIDER_SITE_OTHER): Payer: Self-pay | Admitting: Physician Assistant

## 2019-11-25 ENCOUNTER — Telehealth (INDEPENDENT_AMBULATORY_CARE_PROVIDER_SITE_OTHER): Payer: 59 | Admitting: Physician Assistant

## 2019-11-25 ENCOUNTER — Encounter (INDEPENDENT_AMBULATORY_CARE_PROVIDER_SITE_OTHER): Payer: Self-pay

## 2019-11-25 ENCOUNTER — Other Ambulatory Visit: Payer: Self-pay

## 2019-11-25 DIAGNOSIS — E669 Obesity, unspecified: Secondary | ICD-10-CM | POA: Diagnosis not present

## 2019-11-25 DIAGNOSIS — Z6833 Body mass index (BMI) 33.0-33.9, adult: Secondary | ICD-10-CM

## 2019-11-25 DIAGNOSIS — E119 Type 2 diabetes mellitus without complications: Secondary | ICD-10-CM

## 2019-11-25 NOTE — Progress Notes (Signed)
TeleHealth Visit:  Due to the COVID-19 pandemic, this visit was completed with telemedicine (audio/video) technology to reduce patient and provider exposure as well as to preserve personal protective equipment.   Guinness has verbally consented to this TeleHealth visit. The patient is located at home, the provider is located at the [facility] office. The participants in this visit include the listed provider and patient. The visit was conducted today via telephone call.  [place] was unable to use realtime audiovisual technology today and the telehealth visit was conducted via telephone.  Chief Complaint: OBESITY Dylin is here to discuss his progress with his obesity treatment plan along with follow-up of his obesity related diagnoses. Genie is on the Category 4 Plan and states he is following his eating plan approximately 80% of the time. Marsha states he is exercising for 0 minutes 0 times per week.  Today's visit was #: 5 Starting weight: 249 lbs Starting date: 01/28/2018  Interim History: Terrial states he is not getting enough protein in.  He reports that his work schedule is crazy and his appetite is decreased.  Subjective:   1. Type 2 diabetes mellitus without complication, without long-term current use of insulin (HCC) Truman is taking metformin. No nausea, vomiting, or diarrhea. Fasting blood sugar averaging 100-110.  Lab Results  Component Value Date   HGBA1C 7.0 (H) 06/28/2019   HGBA1C 7.1 (H) 06/22/2019   HGBA1C 6.3 (H) 06/22/2018   Lab Results  Component Value Date   LDLCALC 88 06/28/2019   CREATININE 1.00 08/10/2019   Lab Results  Component Value Date   INSULIN 17.6 06/22/2019   INSULIN 11.6 05/13/2018   INSULIN 10.5 01/28/2018   Assessment/Plan:   1. Type 2 diabetes mellitus without complication, without long-term current use of insulin (HCC) Good blood sugar control is important to decrease the likelihood of diabetic complications such as  nephropathy, neuropathy, limb loss, blindness, coronary artery disease, and death. Intensive lifestyle modification including diet, exercise and weight loss are the first line of treatment for diabetes.   2. Class 1 obesity with serious comorbidity and body mass index (BMI) of 33.0 to 33.9 in adult, unspecified obesity type Sudais is currently in the action stage of change. As such, his goal is to continue with weight loss efforts. He has agreed to following a lower carbohydrate, vegetable and lean protein rich diet plan.   Exercise goals: For substantial health benefits, adults should do at least 150 minutes (2 hours and 30 minutes) a week of moderate-intensity, or 75 minutes (1 hour and 15 minutes) a week of vigorous-intensity aerobic physical activity, or an equivalent combination of moderate- and vigorous-intensity aerobic activity. Aerobic activity should be performed in episodes of at least 10 minutes, and preferably, it should be spread throughout the week.  Behavioral modification strategies: meal planning and cooking strategies.  Adham has agreed to follow-up with our clinic in 3 weeks. He was informed of the importance of frequent follow-up visits to maximize his success with intensive lifestyle modifications for his multiple health conditions.  Objective:   VITALS: Per patient if applicable, see vitals. GENERAL: Alert and in no acute distress. CARDIOPULMONARY: No increased WOB. Speaking in clear sentences.  PSYCH: Pleasant and cooperative. Speech normal rate and rhythm. Affect is appropriate. Insight and judgement are appropriate. Attention is focused, linear, and appropriate.  NEURO: Oriented as arrived to appointment on time with no prompting.   Lab Results  Component Value Date   CREATININE 1.00 08/10/2019  BUN 18 08/10/2019   NA 138 08/10/2019   K 4.4 08/10/2019   CL 101 08/10/2019   CO2 21 08/10/2019   Lab Results  Component Value Date   ALT 28 06/28/2019   AST 22  06/28/2019   ALKPHOS 75 06/22/2019   BILITOT 0.4 06/28/2019   Lab Results  Component Value Date   HGBA1C 7.0 (H) 06/28/2019   HGBA1C 7.1 (H) 06/22/2019   HGBA1C 6.3 (H) 06/22/2018   HGBA1C 6.2 (H) 01/28/2018   HGBA1C 6.7 11/07/2017   Lab Results  Component Value Date   INSULIN 17.6 06/22/2019   INSULIN 11.6 05/13/2018   INSULIN 10.5 01/28/2018   Lab Results  Component Value Date   TSH 1.330 01/28/2018   Lab Results  Component Value Date   CHOL 170 06/28/2019   HDL 61 06/28/2019   LDLCALC 88 06/28/2019   TRIG 109 06/28/2019   CHOLHDL 2.8 06/28/2019   Lab Results  Component Value Date   WBC 6.5 06/28/2019   HGB 14.9 06/28/2019   HCT 44.5 06/28/2019   MCV 83.5 06/28/2019   PLT 293 06/28/2019   Attestation Statements:   Reviewed by clinician on day of visit: allergies, medications, problem list, medical history, surgical history, family history, social history, and previous encounter notes.  I, Water quality scientist, CMA, am acting as Location manager for [employer], PA-C.  I have reviewed the above documentation for accuracy and completeness, and I agree with the above. Abby Potash, PA-C

## 2019-12-01 DIAGNOSIS — E119 Type 2 diabetes mellitus without complications: Secondary | ICD-10-CM | POA: Diagnosis not present

## 2019-12-01 DIAGNOSIS — I10 Essential (primary) hypertension: Secondary | ICD-10-CM | POA: Diagnosis not present

## 2019-12-15 ENCOUNTER — Other Ambulatory Visit: Payer: Self-pay

## 2019-12-15 ENCOUNTER — Encounter (INDEPENDENT_AMBULATORY_CARE_PROVIDER_SITE_OTHER): Payer: Self-pay | Admitting: Physician Assistant

## 2019-12-15 ENCOUNTER — Ambulatory Visit (INDEPENDENT_AMBULATORY_CARE_PROVIDER_SITE_OTHER): Payer: 59 | Admitting: Physician Assistant

## 2019-12-15 VITALS — BP 139/95 | HR 78 | Temp 98.0°F | Ht 73.0 in | Wt 249.0 lb

## 2019-12-15 DIAGNOSIS — E7849 Other hyperlipidemia: Secondary | ICD-10-CM | POA: Diagnosis not present

## 2019-12-15 DIAGNOSIS — T733XXA Exhaustion due to excessive exertion, initial encounter: Secondary | ICD-10-CM | POA: Diagnosis not present

## 2019-12-15 DIAGNOSIS — Z6832 Body mass index (BMI) 32.0-32.9, adult: Secondary | ICD-10-CM

## 2019-12-15 DIAGNOSIS — E669 Obesity, unspecified: Secondary | ICD-10-CM | POA: Diagnosis not present

## 2019-12-15 DIAGNOSIS — E559 Vitamin D deficiency, unspecified: Secondary | ICD-10-CM

## 2019-12-15 DIAGNOSIS — E119 Type 2 diabetes mellitus without complications: Secondary | ICD-10-CM

## 2019-12-15 DIAGNOSIS — Z9189 Other specified personal risk factors, not elsewhere classified: Secondary | ICD-10-CM | POA: Diagnosis not present

## 2019-12-15 MED ORDER — VITAMIN D (ERGOCALCIFEROL) 1.25 MG (50000 UNIT) PO CAPS: 50000.0000 [IU] | ORAL_CAPSULE | ORAL | 0 refills | Status: DC

## 2019-12-15 NOTE — Progress Notes (Signed)
Chief Complaint:   OBESITY [name] is here to discuss his progress with his obesity treatment plan along with follow-up of his obesity related diagnoses. [name] is on the Category 4 Plan and states he is following his eating plan approximately 60-70% of the time. [name] states he is walking for 30 minutes 2 times per week.  Today's visit was #: 68 Starting weight: 249 lbs Starting date: 01/28/2018 Today's weight: 249 lbs Today's date: 12/15/2019 Total lbs lost to date: 0 Total lbs lost since last in-office visit: 5 lbs  Interim History: [name] states that he has been very stressed recently with home life and work.  He has been following the plan better within the last few weeks.  Subjective:   1. Type 2 diabetes mellitus without complication, without long-term current use of insulin (HCC) Medications reviewed. He is taking [place] and metformin.  No nausea, vomiting, or diarrhea.  Fasting blood sugars range between 120-150.  Post prandial 160-220.  He is due for labs.  No hypoglycemia.  Lab Results  Component Value Date   HGBA1C 7.0 (H) 06/28/2019   HGBA1C 7.1 (H) 06/22/2019   HGBA1C 6.3 (H) 06/22/2018   Lab Results  Component Value Date   LDLCALC 88 06/28/2019   CREATININE 1.00 08/10/2019   Lab Results  Component Value Date   INSULIN 17.6 06/22/2019   INSULIN 11.6 05/13/2018   INSULIN 10.5 01/28/2018   2. Other hyperlipidemia [name] has hyperlipidemia and has been trying to improve his cholesterol levels with intensive lifestyle modification including a low saturated fat diet, exercise and weight loss. He denies any chest pain, claudication or myalgias.  He is taking atorvastatin.  His endocrinologist recently checked his labs and his triglycerides were elevated.  LDL at goal.  Lab Results  Component Value Date   ALT 28 06/28/2019   AST 22 06/28/2019   ALKPHOS 75 06/22/2019   BILITOT 0.4 06/28/2019   Lab Results  Component Value Date   CHOL 170 06/28/2019   HDL  61 06/28/2019   LDLCALC 88 06/28/2019   TRIG 109 06/28/2019   CHOLHDL 2.8 06/28/2019   3. Vitamin D deficiency [name]'s Vitamin D level was 38.6 on 06/22/2019. He is currently taking prescription vitamin D 50,000 IU each week. He denies nausea, vomiting or muscle weakness.  4. Fatigue due to excessive exertion, initial encounter He also notes some hair loss.  He endorses being tired throughout the day.  5. At risk for heart disease [name] is at a higher than average risk for cardiovascular disease due to obesity.   Assessment/Plan:   1. Type 2 diabetes mellitus without complication, without long-term current use of insulin (HCC) Good blood sugar control is important to decrease the likelihood of diabetic complications such as nephropathy, neuropathy, limb loss, blindness, coronary artery disease, and death. Intensive lifestyle modification including diet, exercise and weight loss are the first line of treatment for diabetes.  Follow-up with Endocrinology in the next 2 weeks. - Hemoglobin A1c - Insulin, random  2. Other hyperlipidemia Cardiovascular risk and specific lipid/LDL goals reviewed.  We discussed several lifestyle modifications today and [name] will continue to work on diet, exercise and weight loss efforts. Orders and follow up as documented in patient record.   Counseling Intensive lifestyle modifications are the first line treatment for this issue. . Dietary changes: Increase soluble fiber. Decrease simple carbohydrates. . Exercise changes: Moderate to vigorous-intensity aerobic activity 150 minutes per week if tolerated. . Lipid-lowering medications: see documented in medical  record. - Comprehensive metabolic panel  3. Vitamin D deficiency Low Vitamin D level contributes to fatigue and are associated with obesity, breast, and colon cancer. He agrees to continue to take prescription Vitamin D @50 ,000 IU every week and will follow-up for routine testing of Vitamin D, at  least 2-3 times per year to avoid over-replacement. - VITAMIN D 25 Hydroxy (Vit-D Deficiency, Fractures) - Vitamin D, Ergocalciferol, (DRISDOL) 1.25 MG (50000 UNIT) CAPS capsule; Take 1 capsule (50,000 Units total) by mouth 2 (two) times a week.  Dispense: 10 capsule; Refill: 0  4. Fatigue due to excessive exertion, initial encounter Will check thyroid panel today, as below. - T3 - T4, free - TSH  5. At risk for heart disease [name] was given approximately 15 minutes of coronary artery disease prevention counseling today. He is 44 y.o. male and has risk factors for heart disease including obesity. We discussed intensive lifestyle modifications today with an emphasis on specific weight loss instructions and strategies.   Repetitive spaced learning was employed today to elicit superior memory formation and behavioral change.  6. Class 1 obesity with serious comorbidity and body mass index (BMI) of 32.0 to 32.9 in adult, unspecified obesity type [name] is currently in the action stage of change. As such, his goal is to continue with weight loss efforts. He has agreed to following a lower carbohydrate, vegetable and lean protein rich diet plan.   Exercise goals: As is.  Behavioral modification strategies: meal planning and cooking strategies and keeping healthy foods in the home.  [name] has agreed to follow-up with our clinic in 2-3 weeks. He was informed of the importance of frequent follow-up visits to maximize his success with intensive lifestyle modifications for his multiple health conditions.   [name] was informed we would discuss his lab results at his next visit unless there is a critical issue that needs to be addressed sooner. [name] agreed to keep his next visit at the agreed upon time to discuss these results.  Objective:   Blood pressure (!) 139/95, pulse 78, temperature 98 F (36.7 C), temperature source Oral, height 6\' 1"  (1.854 m), weight 249 lb (112.9 kg), SpO2 99 %. Body  mass index is 32.85 kg/m.  General: Cooperative, alert, well developed, in no acute distress. HEENT: Conjunctivae and lids unremarkable. Cardiovascular: Regular rhythm.  Lungs: Normal work of breathing. Neurologic: No focal deficits.   Lab Results  Component Value Date   CREATININE 1.00 08/10/2019   BUN 18 08/10/2019   NA 138 08/10/2019   K 4.4 08/10/2019   CL 101 08/10/2019   CO2 21 08/10/2019   Lab Results  Component Value Date   ALT 28 06/28/2019   AST 22 06/28/2019   ALKPHOS 75 06/22/2019   BILITOT 0.4 06/28/2019   Lab Results  Component Value Date   HGBA1C 7.0 (H) 06/28/2019   HGBA1C 7.1 (H) 06/22/2019   HGBA1C 6.3 (H) 06/22/2018   HGBA1C 6.2 (H) 01/28/2018   HGBA1C 6.7 11/07/2017   Lab Results  Component Value Date   INSULIN 17.6 06/22/2019   INSULIN 11.6 05/13/2018   INSULIN 10.5 01/28/2018   Lab Results  Component Value Date   TSH 1.330 01/28/2018   Lab Results  Component Value Date   CHOL 170 06/28/2019   HDL 61 06/28/2019   LDLCALC 88 06/28/2019   TRIG 109 06/28/2019   CHOLHDL 2.8 06/28/2019   Lab Results  Component Value Date   WBC 6.5 06/28/2019   HGB 14.9 06/28/2019  HCT 44.5 06/28/2019   MCV 83.5 06/28/2019   PLT 293 06/28/2019   Attestation Statements:   Reviewed by clinician on day of visit: allergies, medications, problem list, medical history, surgical history, family history, social history, and previous encounter notes.  I, Water quality scientist, CMA, am acting as Location manager for [employer], PA-C.  I have reviewed the above documentation for accuracy and completeness, and I agree with the above. Abby Potash, PA-C

## 2019-12-23 DIAGNOSIS — E1165 Type 2 diabetes mellitus with hyperglycemia: Secondary | ICD-10-CM | POA: Diagnosis not present

## 2019-12-23 DIAGNOSIS — E119 Type 2 diabetes mellitus without complications: Secondary | ICD-10-CM | POA: Diagnosis not present

## 2019-12-24 LAB — INSULIN, RANDOM

## 2019-12-24 LAB — COMPREHENSIVE METABOLIC PANEL

## 2019-12-24 LAB — T4, FREE

## 2019-12-24 LAB — HEMOGLOBIN A1C
Est. average glucose Bld gHb Est-mCnc: 243 mg/dL
Hgb A1c MFr Bld: 10.1 % — ABNORMAL HIGH (ref 4.8–5.6)

## 2019-12-24 LAB — TSH

## 2019-12-24 LAB — VITAMIN D 25 HYDROXY (VIT D DEFICIENCY, FRACTURES)

## 2019-12-24 LAB — T3

## 2019-12-27 ENCOUNTER — Encounter (INDEPENDENT_AMBULATORY_CARE_PROVIDER_SITE_OTHER): Payer: Self-pay | Admitting: Physician Assistant

## 2019-12-27 DIAGNOSIS — E559 Vitamin D deficiency, unspecified: Secondary | ICD-10-CM | POA: Diagnosis not present

## 2019-12-27 DIAGNOSIS — E119 Type 2 diabetes mellitus without complications: Secondary | ICD-10-CM | POA: Diagnosis not present

## 2019-12-28 LAB — COMPREHENSIVE METABOLIC PANEL
ALT: 37 IU/L (ref 0–44)
AST: 25 IU/L (ref 0–40)
Albumin/Globulin Ratio: 1.9 (ref 1.2–2.2)
Albumin: 4.7 g/dL (ref 4.0–5.0)
Alkaline Phosphatase: 79 IU/L (ref 39–117)
BUN/Creatinine Ratio: 19 (ref 9–20)
BUN: 20 mg/dL (ref 6–24)
Bilirubin Total: 0.4 mg/dL (ref 0.0–1.2)
CO2: 21 mmol/L (ref 20–29)
Calcium: 10 mg/dL (ref 8.7–10.2)
Chloride: 102 mmol/L (ref 96–106)
Creatinine, Ser: 1.06 mg/dL (ref 0.76–1.27)
GFR calc Af Amer: 97 mL/min/{1.73_m2} (ref >59)
GFR calc non Af Amer: 84 mL/min/{1.73_m2} (ref >59)
Globulin, Total: 2.5 g/dL (ref 1.5–4.5)
Glucose: 136 mg/dL — ABNORMAL HIGH (ref 65–99)
Potassium: 3.9 mmol/L (ref 3.5–5.2)
Sodium: 141 mmol/L (ref 134–144)
Total Protein: 7.2 g/dL (ref 6.0–8.5)

## 2019-12-28 LAB — LIPID PANEL WITH LDL/HDL RATIO
Cholesterol, Total: 139 mg/dL (ref 100–199)
HDL: 46 mg/dL (ref >39)
LDL Chol Calc (NIH): 73 mg/dL (ref 0–99)
LDL/HDL Ratio: 1.6 ratio (ref 0.0–3.6)
Triglycerides: 110 mg/dL (ref 0–149)
VLDL Cholesterol Cal: 20 mg/dL (ref 5–40)

## 2019-12-28 LAB — TSH: TSH: 0.894 u[IU]/mL (ref 0.450–4.500)

## 2019-12-28 LAB — INSULIN, RANDOM: INSULIN: 27.6 u[IU]/mL — ABNORMAL HIGH (ref 2.6–24.9)

## 2019-12-28 LAB — VITAMIN D 25 HYDROXY (VIT D DEFICIENCY, FRACTURES): Vit D, 25-Hydroxy: 46 ng/mL (ref 30.0–100.0)

## 2019-12-28 LAB — T4, FREE: Free T4: 1.37 ng/dL (ref 0.82–1.77)

## 2019-12-28 LAB — T3: T3, Total: 133 ng/dL (ref 71–180)

## 2019-12-29 ENCOUNTER — Ambulatory Visit (INDEPENDENT_AMBULATORY_CARE_PROVIDER_SITE_OTHER): Payer: 59 | Admitting: Physician Assistant

## 2019-12-30 ENCOUNTER — Ambulatory Visit (INDEPENDENT_AMBULATORY_CARE_PROVIDER_SITE_OTHER): Payer: 59 | Admitting: Physician Assistant

## 2019-12-30 ENCOUNTER — Other Ambulatory Visit: Payer: Self-pay

## 2019-12-30 ENCOUNTER — Encounter (INDEPENDENT_AMBULATORY_CARE_PROVIDER_SITE_OTHER): Payer: Self-pay | Admitting: Physician Assistant

## 2019-12-30 VITALS — BP 116/83 | HR 94 | Temp 98.4°F | Ht 73.0 in | Wt 246.0 lb

## 2019-12-30 DIAGNOSIS — E119 Type 2 diabetes mellitus without complications: Secondary | ICD-10-CM | POA: Diagnosis not present

## 2019-12-30 DIAGNOSIS — E669 Obesity, unspecified: Secondary | ICD-10-CM | POA: Diagnosis not present

## 2019-12-30 DIAGNOSIS — Z6832 Body mass index (BMI) 32.0-32.9, adult: Secondary | ICD-10-CM

## 2019-12-30 DIAGNOSIS — E66811 Obesity, class 1: Secondary | ICD-10-CM

## 2019-12-30 NOTE — Progress Notes (Signed)
Chief Complaint:   OBESITY [name] is here to discuss his progress with his obesity treatment plan along with follow-up of his obesity related diagnoses. [name] is on following a lower carbohydrate, vegetable and lean protein rich diet plan and states he is following his eating plan approximately 50% of the time. [name] states he is walking for 30 minutes 2 times per week.  Today's visit was #: 40 Starting weight: 249 lbs Starting date: 01/28/2018 Today's weight: 246 lbs Today's date: 12/30/2019 Total lbs lost to date: 3 lbs Total lbs lost since last in-office visit: 3 lbs  Interim History: [name] is very stressed with work and home life.  He is considering changing jobs and moving close to [place].  He has been seeing Endocrinology regularly due to elevated A1c.  He states that he has been doing some stress eating.  Subjective:   1. Type 2 diabetes mellitus without complication, without long-term current use of insulin (HCC) Fasting blood sugars range from 120-150.  Blood sugars after lunch range from 250-300.  After dinner, 180-200.  He is taking [place], glipizide, and metformin.  No hypoglycemia.  Lab Results  Component Value Date   HGBA1C 10.1 (H) 12/15/2019   HGBA1C 7.0 (H) 06/28/2019   HGBA1C 7.1 (H) 06/22/2019   Lab Results  Component Value Date   LDLCALC 73 12/27/2019   CREATININE 1.06 12/27/2019   Lab Results  Component Value Date   INSULIN 27.6 (H) 12/27/2019   INSULIN CANCELED 12/15/2019   INSULIN 17.6 06/22/2019   INSULIN 11.6 05/13/2018   INSULIN 10.5 01/28/2018   Assessment/Plan:   1. Type 2 diabetes mellitus without complication, without long-term current use of insulin (HCC) Good blood sugar control is important to decrease the likelihood of diabetic complications such as nephropathy, neuropathy, limb loss, blindness, coronary artery disease, and death. Intensive lifestyle modification including diet, exercise and weight loss are the first line of  treatment for diabetes.   2. Class 1 obesity with serious comorbidity and body mass index (BMI) of 32.0 to 32.9 in adult, unspecified obesity type [name] is currently in the action stage of change. As such, his goal is to continue with weight loss efforts. He has agreed to following a lower carbohydrate, vegetable and lean protein rich diet plan.   Exercise goals: As is.  Behavioral modification strategies: meal planning and cooking strategies, keeping healthy foods in the home and decreasing junk food.  Werner has agreed to follow-up with our clinic in 2 weeks. He was informed of the importance of frequent follow-up visits to maximize his success with intensive lifestyle modifications for his multiple health conditions.   Objective:   Blood pressure 116/83, pulse 94, temperature 98.4 F (36.9 C), temperature source Oral, height 6\' 1"  (1.854 m), weight 246 lb (111.6 kg), SpO2 99 %. Body mass index is 32.46 kg/m.  General: Cooperative, alert, well developed, in no acute distress. HEENT: Conjunctivae and lids unremarkable. Cardiovascular: Regular rhythm.  Lungs: Normal work of breathing. Neurologic: No focal deficits.   Lab Results  Component Value Date   CREATININE 1.06 12/27/2019   BUN 20 12/27/2019   NA 141 12/27/2019   K 3.9 12/27/2019   CL 102 12/27/2019   CO2 21 12/27/2019   Lab Results  Component Value Date   ALT 37 12/27/2019   AST 25 12/27/2019   ALKPHOS 79 12/27/2019   BILITOT 0.4 12/27/2019   Lab Results  Component Value Date   HGBA1C 10.1 (H) 12/15/2019   HGBA1C  7.0 (H) 06/28/2019   HGBA1C 7.1 (H) 06/22/2019   HGBA1C 6.3 (H) 06/22/2018   HGBA1C 6.2 (H) 01/28/2018   Lab Results  Component Value Date   INSULIN 27.6 (H) 12/27/2019   INSULIN CANCELED 12/15/2019   INSULIN 17.6 06/22/2019   INSULIN 11.6 05/13/2018   INSULIN 10.5 01/28/2018   Lab Results  Component Value Date   TSH 0.894 12/27/2019   Lab Results  Component Value Date   CHOL 139  12/27/2019   HDL 46 12/27/2019   LDLCALC 73 12/27/2019   TRIG 110 12/27/2019   CHOLHDL 2.8 06/28/2019   Lab Results  Component Value Date   WBC 6.5 06/28/2019   HGB 14.9 06/28/2019   HCT 44.5 06/28/2019   MCV 83.5 06/28/2019   PLT 293 06/28/2019   Attestation Statements:   Reviewed by clinician on day of visit: allergies, medications, problem list, medical history, surgical history, family history, social history, and previous encounter notes.  Time spent on visit including pre-visit chart review and post-visit care and charting was 26 minutes.   I, Water quality scientist, CMA, am acting as Location manager for [employer], PA-C.  I have reviewed the above documentation for accuracy and completeness, and I agree with the above. Abby Potash, PA-C

## 2020-01-05 ENCOUNTER — Other Ambulatory Visit (INDEPENDENT_AMBULATORY_CARE_PROVIDER_SITE_OTHER): Payer: Self-pay | Admitting: Physician Assistant

## 2020-01-05 ENCOUNTER — Other Ambulatory Visit: Payer: Self-pay | Admitting: Family Medicine

## 2020-01-05 DIAGNOSIS — E119 Type 2 diabetes mellitus without complications: Secondary | ICD-10-CM

## 2020-01-05 DIAGNOSIS — K219 Gastro-esophageal reflux disease without esophagitis: Secondary | ICD-10-CM

## 2020-01-05 NOTE — Telephone Encounter (Signed)
Requested Prescriptions  Pending Prescriptions Disp Refills  . esomeprazole (NEXIUM) 40 MG capsule [Pharmacy Med Name: ESOMEPRAZOLE MAG DR 40 MG C 40 Capsule] 90 capsule 1    Sig: TAKE 1 CAPSULE BY MOUTH ONCE DAILY     Gastroenterology: Proton Pump Inhibitors Passed - 01/05/2020  7:25 AM      Passed - Valid encounter within last 12 months    Recent Outpatient Visits          2 months ago Acute non-recurrent frontal sinusitis   [facility], DO   5 months ago Annual physical exam   [facility] Olin Hauser, DO   8 months ago Suspected Covid-19 Virus Infection   [facility] Olin Hauser, DO   1 year ago Annual physical exam   [facility] Olin Hauser, DO   2 years ago Type 2 diabetes mellitus with other specified complication, without long-term current use of insulin ([place])   [facility] Parks Ranger, Devonne Doughty, DO      Future Appointments            In 5 days Parks Ranger, Devonne Doughty, DO [facility], [place]   In 11 months Diamantina [place], Herbert Seta, [facility]

## 2020-01-10 ENCOUNTER — Ambulatory Visit (INDEPENDENT_AMBULATORY_CARE_PROVIDER_SITE_OTHER): Payer: 59 | Admitting: Family Medicine

## 2020-01-10 ENCOUNTER — Encounter: Payer: Self-pay | Admitting: Family Medicine

## 2020-01-10 ENCOUNTER — Other Ambulatory Visit: Payer: Self-pay

## 2020-01-10 ENCOUNTER — Other Ambulatory Visit: Payer: Self-pay | Admitting: Family Medicine

## 2020-01-10 VITALS — BP 132/88 | HR 83 | Temp 97.7°F | Resp 16 | Ht 73.0 in | Wt 253.0 lb

## 2020-01-10 DIAGNOSIS — F338 Other recurrent depressive disorders: Secondary | ICD-10-CM | POA: Diagnosis not present

## 2020-01-10 DIAGNOSIS — K644 Residual hemorrhoidal skin tags: Secondary | ICD-10-CM | POA: Diagnosis not present

## 2020-01-10 DIAGNOSIS — I10 Essential (primary) hypertension: Secondary | ICD-10-CM

## 2020-01-10 DIAGNOSIS — E669 Obesity, unspecified: Secondary | ICD-10-CM | POA: Diagnosis not present

## 2020-01-10 DIAGNOSIS — E1169 Type 2 diabetes mellitus with other specified complication: Secondary | ICD-10-CM

## 2020-01-10 DIAGNOSIS — E785 Hyperlipidemia, unspecified: Secondary | ICD-10-CM

## 2020-01-10 DIAGNOSIS — Z125 Encounter for screening for malignant neoplasm of prostate: Secondary | ICD-10-CM

## 2020-01-10 DIAGNOSIS — Z0000 Encounter for general adult medical examination without abnormal findings: Secondary | ICD-10-CM

## 2020-01-10 DIAGNOSIS — F3342 Major depressive disorder, recurrent, in full remission: Secondary | ICD-10-CM

## 2020-01-10 DIAGNOSIS — E1165 Type 2 diabetes mellitus with hyperglycemia: Secondary | ICD-10-CM | POA: Diagnosis not present

## 2020-01-10 MED ORDER — HYDROCORTISONE ACETATE 25 MG RE SUPP: 25.0000 mg | Freq: Two times a day (BID) | RECTAL | 3 refills | Status: AC | PRN

## 2020-01-10 NOTE — Patient Instructions (Addendum)
Thank you for coming to the office today.  Recent Labs    06/22/19 0830 06/28/19 0818 12/15/19 1331  HGBA1C 7.1* 7.0* 10.1*   Keep track of BP and Blood Sugar.  Let me know if issues.  Caution hypoglycemia with Glipizide, - if recurrent issues, check in sooner w Dr Gabriel Carina  DUE for FASTING BLOOD WORK (no food or drink after midnight before the lab appointment, only water or coffee without cream/sugar on the morning of)  SCHEDULE "Lab Only" visit in the morning at the clinic for lab draw in 6 MONTHS   - Make sure Lab Only appointment is at about 1 week before your next appointment, so that results will be available  For Lab Results, once available within 2-3 days of blood draw, you can can log in to MyChart online to view your results and a brief explanation. Also, we can discuss results at next follow-up visit.   Please schedule a Follow-up Appointment to: Return in about 6 months (around 07/11/2020) for Annual Physical.  If you have any other questions or concerns, please feel free to call the office or send a message through [place]. You may also schedule an earlier appointment if necessary.  Additionally, you may be receiving a survey about your experience at our office within a few days to 1 week by e-mail or mail. We value your feedback.  [name], DO [place]

## 2020-01-10 NOTE — Progress Notes (Signed)
Subjective:    Patient ID: [name], male    DOB: 1975/09/22, 44 y.o.   MRN: [id number]  [name] is a 44 y.o. male presenting on 01/10/2020 for Diabetes (6 month follow up)   HPI   CHRONIC DM, Type 2: Obesity BMI >33 Last visit 12/23/19 [facility] Endocrinology Dr Gabriel Carina who is managing his diabetes, every 6 month. Recent A1c 10.1 (12/15/19), he was started on Farxiga. Freestyle Libre (no stick CBG) CBG avg fasting 100, overall avg < 150, high >200 Meds:Metformin 2000mg XR daily at bedtime, Farxiga 10mg  daily, Glipizide XL 10mg  daily Reports good compliance. Tolerating well w/o side-effects Currently on ARB Lifestyle: - Diet (Continues onimproved DM diet) - Exercise (increasing exercise) WIll see Dr Katy Fitch in 01/27/2020 and update eye exam Admits hypoglycemia 63 (Friday AM, felt symptomatic) Denies hypoglycemia  Hemorrhoids, External Recent increased driving, had mild hemorrhoid flare, request re order anusol suppository. Recent flare.  CHRONIC HTN: No new concerns. Admits some stress contributing. Home readings are improved 120-140 range. Current Meds - Valsartan-HCTZ 320-25mg  dailyin AM (previously increased) Reports good compliance, took meds today. Tolerating well, w/o complaints.  History of Depression, in remission/PossibleSeasonal Affective Disorder - Reports chronic problem has episodic flaresusually related to life stressors and winter months - Followed by [facility]- on Duloxetine 60mg  daily, Trintellix 20mg  daily. - Reports he is doing well overall, currently depression is controlled, he feels like it is in remission overall  Health Maintenance: UTD COVID19 vaccine  Depression screen [facility] 2/9 01/10/2020 04/27/2019 11/17/2018  Decreased Interest 0 0 1  Down, Depressed, Hopeless 0 0 0  PHQ - 2 Score 0 0 1  Altered sleeping 0 0 0  Tired, decreased energy 0 0 1  Change in appetite 0 0 0  Feeling bad or failure about yourself  0 0 0  Trouble  concentrating 0 0 1  Moving slowly or fidgety/restless 0 0 0  Suicidal thoughts 0 0 0  PHQ-9 Score 0 0 3  Difficult doing work/chores Not difficult at all Not difficult at all -  Some recent data might be hidden    Social History   Tobacco Use  . Smoking status: Never Smoker  . Smokeless tobacco: Never Used  Substance Use Topics  . Alcohol use: Yes    Alcohol/week: 0.0 standard drinks    Comment: RARE  . Drug use: No    Review of Systems Per HPI unless specifically indicated above     Objective:    BP 132/88 (BP Location: Left Arm, Cuff Size: Normal)   Pulse 83   Temp 97.7 F (36.5 C) (Temporal)   Resp 16   Ht 6\' 1"  (1.854 m)   Wt 253 lb (114.8 kg)   BMI 33.38 kg/m   Wt Readings from Last 3 Encounters:  01/10/20 253 lb (114.8 kg)  12/30/19 246 lb (111.6 kg)  12/15/19 249 lb (112.9 kg)    Physical Exam Vitals and nursing note reviewed.  Constitutional:      General: He is not in acute distress.    Appearance: He is well-developed. He is not diaphoretic.     Comments: Well-appearing, comfortable, cooperative  HENT:     Head: Normocephalic and atraumatic.  Eyes:     General:        Right eye: No discharge.        Left eye: No discharge.     Conjunctiva/sclera: Conjunctivae normal.  Cardiovascular:     Rate and Rhythm: Normal rate.  Pulmonary:  Effort: Pulmonary effort is normal.  Skin:    General: Skin is warm and dry.     Findings: No erythema or rash.  Neurological:     Mental Status: He is alert and oriented to person, place, and time.  Psychiatric:        Behavior: Behavior normal.     Comments: Well groomed, good eye contact, normal speech and thoughts      Diabetic Foot Exam - Simple   Simple Foot Form Diabetic Foot exam was performed with the following findings: Yes 01/10/2020  9:03 AM  Visual Inspection No deformities, no ulcerations, no other skin breakdown bilaterally: Yes Sensation Testing Intact to touch and monofilament testing  bilaterally: Yes Pulse Check Posterior Tibialis and Dorsalis pulse intact bilaterally: Yes Comments    Recent Labs    06/22/19 0830 06/28/19 0818 12/15/19 1331  HGBA1C 7.1* 7.0* 10.1*     Results for orders placed or performed in visit on 12/15/19  Comprehensive metabolic panel  Result Value Ref Range   Glucose CANCELED mg/dL   BUN CANCELED    Creatinine, Ser CANCELED    Sodium CANCELED    Potassium CANCELED    Chloride CANCELED    CO2 CANCELED    Calcium CANCELED    Total Protein CANCELED    Albumin CANCELED    Bilirubin Total CANCELED    Alkaline Phosphatase CANCELED    AST CANCELED    ALT CANCELED   Hemoglobin A1c  Result Value Ref Range   Hgb A1c MFr Bld 10.1 (H) 4.8 - 5.6 %   Est. average glucose Bld gHb Est-mCnc 243 mg/dL  Insulin, random  Result Value Ref Range   INSULIN CANCELED uIU/mL  VITAMIN D 25 Hydroxy (Vit-D Deficiency, Fractures)  Result Value Ref Range   Vit D, 25-Hydroxy CANCELED ng/mL  T3  Result Value Ref Range   T3, Total CANCELED ng/dL  T4, free  Result Value Ref Range   Free T4 CANCELED ng/dL  TSH  Result Value Ref Range   TSH CANCELED uIU/mL      Assessment & Plan:   Problem List Items Addressed This Visit    Type 2 diabetes mellitus with other specified complication ([place]) - Primary    Previously controlled A1c, last reading up to 10.1 (11/3019) - now CBGs improved Complicated with HLD, HTN, GERD Followed by [facility] Endocrinology Dr Gabriel Carina  Plan:  1. Continue current therapy - Metformin XR 2000mg  daily, Farxiga 10mg  daily 2. Encourage improved lifestyle - low carb, low sugar diet, reduce portion size, continue improving regular exercise 3. Continue f/u Endocrinology as scheduled      Relevant Medications   metFORMIN (GLUCOPHAGE XR) 500 MG 24 hr tablet   Seasonal affective disorder ([place])    See A&P for depression Suspected factor contributing to his mood as well Continue treatment and Psych      Relevant Medications    TRINTELLIX 20 MG TABS tablet   Obesity (BMI 30.0-34.9)    Encourage diet exercise wt loss      Essential hypertension    Mild elevated BP, recheck is improved. Cardiology had adjusted blood pressure med previously Home readings normal No known complications    Plan:  1. Continue current BP regimen - Valsartan-HCTZ 320-25mg  daily 2. Encourage improved lifestyle - low sodium diet, regular exercise 3. May continue monitor BP outside office, bring readings to next visit, if persistently >140/90 or new symptoms notify office sooner      Depression, major, recurrent, in complete  remission (HCC)    Stable, controlled. Seems currently in remission. Suspected factor with Seasonal Affective Disorder Followed by Psychiatry Dr [facility] in [place] Continue Duloxetine 60mg  + Trintellix 20mg  daily Off Wellbutrin, Fluoxetine      Relevant Medications   TRINTELLIX 20 MG TABS tablet    Other Visit Diagnoses    Hemorrhoids, external       Relevant Medications   hydrocortisone (ANUSOL-HC) 25 MG suppository    Acute on chronic ext hemorrhoid flare Treat with re order rx Anusol suppository   Meds ordered this encounter  Medications  . hydrocortisone (ANUSOL-HC) 25 MG suppository    Sig: Place 1 suppository (25 mg total) rectally 2 (two) times daily as needed for hemorrhoids or anal itching. For 7 days    Dispense:  14 suppository    Refill:  3      Follow up plan: Return in about 6 months (around 07/11/2020) for Annual Physical.  Future labs ordered for 07/06/20   [name], [facility] 01/10/2020, 8:18 AM

## 2020-01-10 NOTE — Assessment & Plan Note (Signed)
See A&P for depression Suspected factor contributing to his mood as well Continue treatment and Psych

## 2020-01-10 NOTE — Assessment & Plan Note (Signed)
Mild elevated BP, recheck is improved. Cardiology had adjusted blood pressure med previously Home readings normal No known complications    Plan:  1. Continue current BP regimen - Valsartan-HCTZ 320-25mg  daily 2. Encourage improved lifestyle - low sodium diet, regular exercise 3. May continue monitor BP outside office, bring readings to next visit, if persistently >140/90 or new symptoms notify office sooner

## 2020-01-10 NOTE — Assessment & Plan Note (Signed)
Previously controlled A1c, last reading up to 10.1 (11/3019) - now CBGs improved Complicated with HLD, HTN, GERD Followed by [facility] Endocrinology Dr Gabriel Carina  Plan:  1. Continue current therapy - Metformin XR 2000mg  daily, Farxiga 10mg  daily 2. Encourage improved lifestyle - low carb, low sugar diet, reduce portion size, continue improving regular exercise 3. Continue f/u Endocrinology as scheduled

## 2020-01-10 NOTE — Assessment & Plan Note (Signed)
Encourage diet exercise wt loss

## 2020-01-10 NOTE — Assessment & Plan Note (Signed)
Stable, controlled. Seems currently in remission. Suspected factor with Seasonal Affective Disorder Followed by Psychiatry Dr Kaur in [place] Continue Duloxetine 60mg + Trintellix 20mg daily Off Wellbutrin, Fluoxetine 

## 2020-01-18 ENCOUNTER — Ambulatory Visit (INDEPENDENT_AMBULATORY_CARE_PROVIDER_SITE_OTHER): Payer: 59 | Admitting: Physician Assistant

## 2020-01-18 ENCOUNTER — Other Ambulatory Visit: Payer: Self-pay

## 2020-01-18 ENCOUNTER — Encounter (INDEPENDENT_AMBULATORY_CARE_PROVIDER_SITE_OTHER): Payer: Self-pay | Admitting: Physician Assistant

## 2020-01-18 VITALS — BP 116/83 | HR 78 | Temp 97.9°F | Ht 73.0 in | Wt 244.0 lb

## 2020-01-18 DIAGNOSIS — I10 Essential (primary) hypertension: Secondary | ICD-10-CM | POA: Diagnosis not present

## 2020-01-18 DIAGNOSIS — Z9189 Other specified personal risk factors, not elsewhere classified: Secondary | ICD-10-CM

## 2020-01-18 DIAGNOSIS — E669 Obesity, unspecified: Secondary | ICD-10-CM | POA: Diagnosis not present

## 2020-01-18 DIAGNOSIS — Z6832 Body mass index (BMI) 32.0-32.9, adult: Secondary | ICD-10-CM

## 2020-01-18 DIAGNOSIS — E559 Vitamin D deficiency, unspecified: Secondary | ICD-10-CM | POA: Diagnosis not present

## 2020-01-18 DIAGNOSIS — E1159 Type 2 diabetes mellitus with other circulatory complications: Secondary | ICD-10-CM

## 2020-01-18 MED ORDER — VITAMIN D (ERGOCALCIFEROL) 1.25 MG (50000 UNIT) PO CAPS: 50000.0000 [IU] | ORAL_CAPSULE | ORAL | 0 refills | Status: DC

## 2020-01-18 NOTE — Progress Notes (Signed)
Chief Complaint:   OBESITY NED C Urenda is here to discuss his progress with his obesity treatment plan along with follow-up of his obesity related diagnoses. Izaha is on a modified Category 4 Plan and states he is following his eating plan approximately 75% of the time. Elijsha states he is walking 20-30 minutes 2-3 times per week.  Today's visit was #: 35 Starting weight: 249 lbs Starting date: 01/28/2018 Today's weight: 244 lbs Today's date: 01/18/2020 Total lbs lost to date: 5 Total lbs lost since last in-office visit: 2  Interim History: Tilmon states that he has modified his low carb plan to include fruit and root veggies. He is doing better overall and his stress level has decreased.  Subjective:   Hypertension associated with type 2 diabetes mellitus ([place]). Fasting blood sugars 90-120. His endocrinologist recently started him on Ozempic and discontinued glipizide.  Lab Results  Component Value Date   HGBA1C 10.1 (H) 12/15/2019   HGBA1C 7.0 (H) 06/28/2019   HGBA1C 7.1 (H) 06/22/2019   Lab Results  Component Value Date   LDLCALC 73 12/27/2019   CREATININE 1.06 12/27/2019   Lab Results  Component Value Date   INSULIN 27.6 (H) 12/27/2019   INSULIN CANCELED 12/15/2019   INSULIN 17.6 06/22/2019   INSULIN 11.6 05/13/2018   INSULIN 10.5 01/28/2018   Vitamin D deficiency. No nausea, vomiting, or muscle weakness on Vitamin D twice weekly. Last Vitamin D 46.0 on 12/27/2019.  At risk for heart disease. Arav is at a higher than average risk for cardiovascular disease due to obesity.   Assessment/Plan:   Hypertension associated with type 2 diabetes mellitus ([place]). Good blood sugar control is important to decrease the likelihood of diabetic complications such as nephropathy, neuropathy, limb loss, blindness, coronary artery disease, and death. Intensive lifestyle modification including diet, exercise and weight loss are the first line of treatment for diabetes. Wilfredo  will continue his medication as directed.  Vitamin D deficiency. Low Vitamin D level contributes to fatigue and are associated with obesity, breast, and colon cancer. He was given a refill on his Vitamin D, Ergocalciferol, (DRISDOL) 1.25 MG (50000 UNIT) CAPS capsule 2 times a week #10 with 0 refills and will follow-up for routine testing of Vitamin D, at least 2-3 times per year to avoid over-replacement.     At risk for heart disease. Yavier was given approximately 15 minutes of coronary artery disease prevention counseling today. He is 44 y.o. male and has risk factors for heart disease including obesity. We discussed intensive lifestyle modifications today with an emphasis on specific weight loss instructions and strategies.   Repetitive spaced learning was employed today to elicit superior memory formation and behavioral change.  Class 1 obesity with serious comorbidity and body mass index (BMI) of 32.0 to 32.9 in adult, unspecified obesity type.  Jaymin is currently in the action stage of change. As such, his goal is to continue with weight loss efforts. He has agreed to following the [employer].  Exercise goals: For substantial health benefits, adults should do at least 150 minutes (2 hours and 30 minutes) a week of moderate-intensity, or 75 minutes (1 hour and 15 minutes) a week of vigorous-intensity aerobic physical activity, or an equivalent combination of moderate- and vigorous-intensity aerobic activity. Aerobic activity should be performed in episodes of at least 10 minutes, and preferably, it should be spread throughout the week.  Behavioral modification strategies: meal planning and cooking strategies and keeping healthy foods in the home.  Hilaire has agreed to follow-up with our clinic in 2-3 weeks. He was informed of the importance of frequent follow-up visits to maximize his success with intensive lifestyle modifications for his multiple health conditions.   Objective:   Blood  pressure 116/83, pulse 78, temperature 97.9 F (36.6 C), temperature source Oral, height 6\' 1"  (1.854 m), weight 244 lb (110.7 kg), SpO2 100 %. Body mass index is 32.19 kg/m.  General: Cooperative, alert, well developed, in no acute distress. HEENT: Conjunctivae and lids unremarkable. Cardiovascular: Regular rhythm.  Lungs: Normal work of breathing. Neurologic: No focal deficits.   Lab Results  Component Value Date   CREATININE 1.06 12/27/2019   BUN 20 12/27/2019   NA 141 12/27/2019   K 3.9 12/27/2019   CL 102 12/27/2019   CO2 21 12/27/2019   Lab Results  Component Value Date   ALT 37 12/27/2019   AST 25 12/27/2019   ALKPHOS 79 12/27/2019   BILITOT 0.4 12/27/2019   Lab Results  Component Value Date   HGBA1C 10.1 (H) 12/15/2019   HGBA1C 7.0 (H) 06/28/2019   HGBA1C 7.1 (H) 06/22/2019   HGBA1C 6.3 (H) 06/22/2018   HGBA1C 6.2 (H) 01/28/2018   Lab Results  Component Value Date   INSULIN 27.6 (H) 12/27/2019   INSULIN CANCELED 12/15/2019   INSULIN 17.6 06/22/2019   INSULIN 11.6 05/13/2018   INSULIN 10.5 01/28/2018   Lab Results  Component Value Date   TSH 0.894 12/27/2019   Lab Results  Component Value Date   CHOL 139 12/27/2019   HDL 46 12/27/2019   LDLCALC 73 12/27/2019   TRIG 110 12/27/2019   CHOLHDL 2.8 06/28/2019   Lab Results  Component Value Date   WBC 6.5 06/28/2019   HGB 14.9 06/28/2019   HCT 44.5 06/28/2019   MCV 83.5 06/28/2019   PLT 293 06/28/2019   No results found for: IRON, TIBC, FERRITIN  Attestation Statements:   Reviewed by clinician on day of visit: allergies, medications, problem list, medical history, surgical history, family history, social history, and previous encounter notes.  IMichaelene Song, am acting as transcriptionist for Abby Potash, PA-C   I have reviewed the above documentation for accuracy and completeness, and I agree with the above. Abby Potash, PA-C

## 2020-01-27 ENCOUNTER — Encounter: Payer: Self-pay | Admitting: Family Medicine

## 2020-01-27 DIAGNOSIS — E119 Type 2 diabetes mellitus without complications: Secondary | ICD-10-CM | POA: Diagnosis not present

## 2020-01-27 DIAGNOSIS — H04123 Dry eye syndrome of bilateral lacrimal glands: Secondary | ICD-10-CM | POA: Diagnosis not present

## 2020-01-27 DIAGNOSIS — H10413 Chronic giant papillary conjunctivitis, bilateral: Secondary | ICD-10-CM | POA: Diagnosis not present

## 2020-01-27 LAB — HM DIABETES EYE EXAM

## 2020-01-31 ENCOUNTER — Other Ambulatory Visit: Payer: Self-pay

## 2020-02-01 MED ORDER — ATORVASTATIN CALCIUM 20 MG PO TABS: 20.0000 mg | ORAL_TABLET | Freq: Every day | ORAL | 3 refills | Status: DC

## 2020-02-09 ENCOUNTER — Other Ambulatory Visit: Payer: Self-pay | Admitting: Family Medicine

## 2020-02-09 ENCOUNTER — Other Ambulatory Visit: Payer: Self-pay

## 2020-02-09 ENCOUNTER — Ambulatory Visit (INDEPENDENT_AMBULATORY_CARE_PROVIDER_SITE_OTHER): Payer: 59 | Admitting: Family Medicine

## 2020-02-09 ENCOUNTER — Encounter (INDEPENDENT_AMBULATORY_CARE_PROVIDER_SITE_OTHER): Payer: Self-pay | Admitting: Family Medicine

## 2020-02-09 VITALS — BP 106/76 | HR 86 | Temp 98.0°F | Ht 73.0 in | Wt 241.0 lb

## 2020-02-09 DIAGNOSIS — E1165 Type 2 diabetes mellitus with hyperglycemia: Secondary | ICD-10-CM

## 2020-02-09 DIAGNOSIS — M1990 Unspecified osteoarthritis, unspecified site: Secondary | ICD-10-CM

## 2020-02-09 DIAGNOSIS — Z6831 Body mass index (BMI) 31.0-31.9, adult: Secondary | ICD-10-CM | POA: Diagnosis not present

## 2020-02-09 DIAGNOSIS — E669 Obesity, unspecified: Secondary | ICD-10-CM

## 2020-02-09 NOTE — Progress Notes (Signed)
Chief Complaint:   OBESITY [name] is here to discuss his progress with his obesity treatment plan along with follow-up of his obesity related diagnoses. [name] is on the Category 4 plan and states he is following his eating plan approximately 80% of the time. [name] states he is walking for 45 minutes 2-3 times per week.  Today's visit was #: 24 Starting weight: 249 lbs Starting date: 01/28/2018 Today's weight: 241 lbs Today's date: 02/09/2020 Total lbs lost to date: 8 Total lbs lost since last in-office visit: 3  Interim History: [name] is following the Category 4 meal plan, but he notes it's a "low carbohydrate version". The lower carbohydrates has spurred weight loss. He has been off the plan until the last month. He says she was dealing with work stress by overeating. He often goes out to eat for lunch. He works very long hours and has a stressful job.   He reports that hunger is controlled.  Subjective:   1. Type 2 diabetes mellitus without complication, without long-term current use of insulin ([name]) [name]'s last A1c was 10.1 on 12/15/2019 which was quite an increase from 7.0 on 06/28/19.[name] DM is managed by Dr. Gabriel Carina. He is on Ozempic and metformin.  His fasting BGs range between 80 and 120. He does not check his 2 hour post prandial CBGs..  Lab Results  Component Value Date   HGBA1C 10.1 (H) 12/15/2019   HGBA1C 7.0 (H) 06/28/2019   HGBA1C 7.1 (H) 06/22/2019   Lab Results  Component Value Date   LDLCALC 73 12/27/2019   CREATININE 1.06 12/27/2019   Lab Results  Component Value Date   INSULIN 27.6 (H) 12/27/2019   INSULIN CANCELED 12/15/2019   INSULIN 17.6 06/22/2019   INSULIN 11.6 05/13/2018   INSULIN 10.5 01/28/2018   Assessment/Plan:   1. Type 2 diabetes mellitus without complication, without long-term current use of insulin (HCC) Good blood sugar control is important to decrease the likelihood of diabetic complications such as nephropathy, neuropathy, limb loss,  blindness, coronary artery disease, and death. Intensive lifestyle modification including diet, exercise and weight loss are the first line of treatment for diabetes. [name] will continue all of his medications, and will continue to follow up with Dr. Gabriel Carina.  2. Class 1 obesity with serious comorbidity and body mass index (BMI) of 31.0 to 31.9 in adult, unspecified obesity type [name] is currently in the action stage of change. As such, his goal is to continue with weight loss efforts. He has agreed to the Category 4 Plan.   Exercise goals: [name] will increase cardio to 150 minutes per week.  Behavioral modification strategies: increasing lean protein intake, decreasing simple carbohydrates and meal planning and cooking strategies.  [name] has agreed to follow-up with our clinic in 2 to 3 weeks with Abby Potash, PA-C. He was informed of the importance of frequent follow-up visits to maximize his success with intensive lifestyle modifications for his multiple health conditions.   Objective:   Blood pressure 106/76, pulse 86, temperature 98 F (36.7 C), temperature source Oral, height 6\' 1"  (1.854 m), weight 241 lb (109.3 kg), SpO2 99 %. Body mass index is 31.8 kg/m.  General: Cooperative, alert, well developed, in no acute distress. HEENT: Conjunctivae and lids unremarkable. Cardiovascular: Regular rhythm.  Lungs: Normal work of breathing. Neurologic: No focal deficits.   Lab Results  Component Value Date   CREATININE 1.06 12/27/2019   BUN 20 12/27/2019   NA 141 12/27/2019   K 3.9 12/27/2019  CL 102 12/27/2019   CO2 21 12/27/2019   Lab Results  Component Value Date   ALT 37 12/27/2019   AST 25 12/27/2019   ALKPHOS 79 12/27/2019   BILITOT 0.4 12/27/2019   Lab Results  Component Value Date   HGBA1C 10.1 (H) 12/15/2019   HGBA1C 7.0 (H) 06/28/2019   HGBA1C 7.1 (H) 06/22/2019   HGBA1C 6.3 (H) 06/22/2018   HGBA1C 6.2 (H) 01/28/2018   Lab Results  Component Value Date    INSULIN 27.6 (H) 12/27/2019   INSULIN CANCELED 12/15/2019   INSULIN 17.6 06/22/2019   INSULIN 11.6 05/13/2018   INSULIN 10.5 01/28/2018   Lab Results  Component Value Date   TSH 0.894 12/27/2019   Lab Results  Component Value Date   CHOL 139 12/27/2019   HDL 46 12/27/2019   LDLCALC 73 12/27/2019   TRIG 110 12/27/2019   CHOLHDL 2.8 06/28/2019   Lab Results  Component Value Date   WBC 6.5 06/28/2019   HGB 14.9 06/28/2019   HCT 44.5 06/28/2019   MCV 83.5 06/28/2019   PLT 293 06/28/2019   No results found for: IRON, TIBC, FERRITIN  Attestation Statements:   Reviewed by clinician on day of visit: allergies, medications, problem list, medical history, surgical history, family history, social history, and previous encounter notes.   Wilhemena Durie, am acting as Location manager for [employer], FNP-C.  I have reviewed the above documentation for accuracy and completeness, and I agree with the above. -  Georgianne Fick, FNP

## 2020-02-21 NOTE — Progress Notes (Signed)
Cardiology Office Note:    Date:  02/22/2020   ID:  [name], DOB 1976/05/09, MRN [id number]  PCP:  Olin Hauser, DO  Cardiologist:  No primary care provider on file.  Electrophysiologist:  None   Referring MD: [name] *   Chief Complaint  Patient presents with  . Coronary Artery Disease    History of Present Illness:    [name] is a 44 y.o. male with a hx of type 2 diabetes, hypertension, nonobstructive CAD who presents for follow-up.   He saw Dr. Tamala Julian for chest pain around 2010 and had a exercise treadmill test that was negative.  He was initially seen on 07/28/2019 for exertional chest pain.  Coronary CTA was done on 08/17/2019, which showed nonobstructive CAD (noncalcified plaque in the proximal LAD causing 25 to 49% stenosis, CT FFR 0.9; calcium score 0).  TTE 08/09/2019 showed normal systolic function.    Since last clinic visit, he reports that he has been doing well.  Denies any chest pain.  Walks 3-4 times per week, for 30-45 minutes.  No chest pain or dyspnea.  Denies any lightheadedness, syncope, or palpitations.  Has not been checking BP.  Recently found to have significant increase in A1c (7.0 ->10.1), was started on ozempic and states that he his glucose is under much better control.    Past Medical History:  Diagnosis Date  . Chest pain   . Family history of adverse reaction to anesthesia    MOM-N/V, DAD-HAS PROBLEMS WITH NOVICAINE  . Food allergy   . GERD (gastroesophageal reflux disease)   . History of hiatal hernia   . History of kidney stones    H/O  . Insomnia   . Joint pain   . Plantar fasciitis   . Seasonal allergies   . Tendonitis of foot    left    Past Surgical History:  Procedure Laterality Date  . CARPAL TUNNEL RELEASE Right 07/03/2017   Procedure: CARPAL TUNNEL RELEASE;  Surgeon: Hessie Knows, MD;  Location: [facility] ORS;  Service: Orthopedics;  Laterality: Right;  . clavide surgery  2005  .  CYSTOSCOPY/URETEROSCOPY/HOLMIUM LASER/STENT PLACEMENT Right 05/22/2018   Procedure: CYSTOSCOPY/URETEROSCOPY/HOLMIUM LASER/STENT PLACEMENT;  Surgeon: Billey Co, MD;  Location: [facility] ORS;  Service: Urology;  Laterality: Right;  . KNEE SURGERY Right 2013  . SHOULDER SURGERY Right 2008    Current Medications: Current Meds  Medication Sig  . atorvastatin (LIPITOR) 20 MG tablet Take 1 tablet (20 mg total) by mouth daily at 6 PM. Over due for appt  Needs follow up for future refills./cy  . DULoxetine (CYMBALTA) 60 MG capsule Take 60 mg by mouth daily.   [name] esomeprazole (NEXIUM) 40 MG capsule TAKE 1 CAPSULE BY MOUTH ONCE DAILY  . etodolac (LODINE) 500 MG tablet TAKE 1 TABLET BY MOUTH TWICE DAILY AS NEEDED (MODERATE PAIN).  . hydrocortisone (ANUSOL-HC) 25 MG suppository Place 1 suppository (25 mg total) rectally 2 (two) times daily as needed for hemorrhoids or anal itching. For 7 days  . ipratropium (ATROVENT) 0.06 % nasal spray Place 2 sprays into both nostrils 3 (three) times daily as needed for rhinitis.  . metFORMIN (GLUCOPHAGE XR) 500 MG 24 hr tablet Take 4 tablets (2,000 mg total) by mouth daily with breakfast.  . Multiple Vitamin (MULTIVITAMIN) capsule Take 1 capsule by mouth daily.  . Semaglutide, 1 MG/DOSE, (OZEMPIC, 1 MG/DOSE,) 2 MG/1.5ML SOPN Inject 0.25 mg into the skin once a week.  . TRINTELLIX 20 MG  TABS tablet Take 20 mg by mouth daily.  . valsartan-hydrochlorothiazide (DIOVAN-HCT) 320-25 MG tablet Take 1 tablet by mouth daily.  . Vitamin D, Ergocalciferol, (DRISDOL) 1.25 MG (50000 UNIT) CAPS capsule Take 1 capsule (50,000 Units total) by mouth 2 (two) times a week.     Allergies:   Tomato   Social History   Socioeconomic History  . Marital status: Married    Spouse name: [name]  . Number of children: 1  . Years of education: Not on file  . Highest education level: Not on file  Occupational History  . 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 Smoking status: Never Smoker  . Smokeless tobacco: Never Used  Substance and Sexual Activity  . Alcohol use: Yes    Alcohol/week: 0.0 standard drinks    Comment: RARE  . Drug use: No  . Sexual activity: Yes  Other Topics Concern  . Not on file  Social History Narrative  . Not on file   Social Determinants of Health   Financial Resource Strain:   . Difficulty of Paying Living Expenses:   Food Insecurity:   . Worried About Charity fundraiser in the Last Year:   . Arboriculturist in the Last Year:   Transportation Needs:   . Film/video editor (Medical):   [name] Lack of Transportation (Non-Medical):   Physical Activity:   . Days of Exercise per Week:   . Minutes of Exercise per Session:   Stress:   . Feeling of Stress :   Social Connections:   . Frequency of Communication with Friends and Family:   . Frequency of Social Gatherings with Friends and Family:   . Attends Religious Services:   . Active Member of Clubs or Organizations:   . Attends Archivist Meetings:   [name] Marital Status:      Family History: The patient's family history includes Alcoholism in his father; Cancer in his brother, maternal grandfather, and mother; Diabetes in his father; Heart disease (age of onset: 32) in his father; Hyperlipidemia in his father and mother; Hypertension in his father; Stroke in his maternal grandfather.  ROS:   Please see the history of present illness.    All other systems reviewed and are negative.  EKGs/Labs/Other Studies Reviewed:    The following studies were reviewed today:   EKG:  EKG is ordered today.  The ekg ordered today demonstrates normal sinus rhythm, rate 88, nonspecific T wave flattening  Coronary CTA 08/17/19: 1. Coronary calcium score of 0. This was 0 percentile for age and sex matched control. 2.  Normal coronary origin with right dominance. 3. Nonobstructive CAD, with noncalcified plaque causing  mild stenosis (25-49%) in the proximal LAD 4. There is a step artifact in the RPLB that precludes interpretation of a portion of the vessel CAD-RADS 2. Mild non-obstructive CAD (25-49%). Consider non-atherosclerotic causes of chest pain. Consider preventive therapy and risk factor modification.  CTFFR 08/17/19: 1. CT-FFR across lesion in proximal LAD is 0.9, suggesting lesion is not functionally significant  TTE 08/09/19:  1. Left ventricular ejection fraction, by visual estimation, is 55 to 60%. The left ventricle has normal function. Left ventricular septal wall thickness was mildly increased. Mildly increased left ventricular posterior wall thickness. There is mildly  increased left ventricular hypertrophy.  2. Left ventricular diastolic parameters are consistent with Grade I diastolic dysfunction (impaired relaxation).  3. Global  right ventricle has normal systolic function.The right ventricular size is normal. No increase in right ventricular wall thickness.  4. Left atrial size was normal.  5. Right atrial size was normal.  6. The mitral valve is normal in structure. No evidence of mitral valve regurgitation. No evidence of mitral stenosis.  7. The tricuspid valve is normal in structure. Tricuspid valve regurgitation is trivial.  8. The aortic valve is tricuspid. Aortic valve regurgitation is not visualized. No evidence of aortic valve sclerosis or stenosis.  9. The pulmonic valve was normal in structure. Pulmonic valve regurgitation is not visualized. 10. Normal pulmonary artery systolic pressure. 11. The inferior vena cava is normal in size with greater than 50% respiratory variability, suggesting right atrial pressure of 3 mmHg.  Recent Labs: 06/28/2019: Hemoglobin 14.9; Platelets 293 12/27/2019: ALT 37; BUN 20; Creatinine, Ser 1.06; Potassium 3.9; Sodium 141; TSH 0.894  Recent Lipid Panel    Component Value Date/Time   CHOL 139 12/27/2019 1008   TRIG 110 12/27/2019 1008    HDL 46 12/27/2019 1008   CHOLHDL 2.8 06/28/2019 0818   LDLCALC 73 12/27/2019 1008   LDLCALC 88 06/28/2019 0818    Physical Exam:    VS:  BP 118/82   Pulse 88   Temp 98.3 F (36.8 C)   Ht 6\' 1"  (1.854 m)   Wt 240 lb (108.9 kg)   SpO2 96%   BMI 31.66 kg/m     Wt Readings from Last 3 Encounters:  02/22/20 240 lb (108.9 kg)  02/09/20 241 lb (109.3 kg)  01/18/20 244 lb (110.7 kg)     GEN:  Well nourished, well developed in no acute distress HEENT: Normal NECK: No JVD; No carotid bruits CARDIAC: RRR, no murmurs, rubs, gallops RESPIRATORY:  Clear to auscultation without rales, wheezing or rhonchi  ABDOMEN: Soft, non-tender, non-distended MUSCULOSKELETAL:  No edema; No deformity  SKIN: Warm and dry NEUROLOGIC:  Alert and oriented x 3 PSYCHIATRIC:  Normal affect   ASSESSMENT:    1. CAD in native artery   2. Essential hypertension   3. Hyperlipidemia, unspecified hyperlipidemia type    PLAN:     Nonobstructive CAD: Coronary CTA 08/17/2019 shows noncalcified plaque in the proximal LAD causing mild (25 to 49%) stenosis.  CT FFR 0.90.  Calcium score 0.  Normal LV systolic function.  No obstructive epicardial coronary artery disease, but given typical chest pain in diabetic with nonobstructive CAD, chest pain could be due to microvascular disease.  No proven therapies for microvascular disease, but there is evidence that exercise and risk factor modification are beneficial.  Encourage patient to exercise regularly.  Currently denies any anginal symptoms. -Continue atorvastatin 20 mg daily  Hypertension: On valsartan-HCTZ 320-25 mg daily.  Appears well controlled  HLD: LDL 88 on 06/28/19, started on atorvastatin 20 mg daily.  LDL 73 on 12/27/2019.  LDL was 50 when checked by his endocrinologist in March.  Discussed that we can increase his atorvastatin to 40 mg daily to target LDL less than 70, but he would prefer to hold off at this time and continue to work on lifestyle  changes  Type 2 diabetes: on metformin and Ozempic.  Last A1c 10.1 on 12/15/2019.  Increased from 7.0 on 06/28/2019, was started on Ozempic and reports much better glucose control recently   RTC  In 6 months   Medication Adjustments/Labs and Tests Ordered: Current medicines are reviewed at length with the patient today.  Concerns regarding medicines are outlined above.  Orders  Placed This Encounter  Procedures  . EKG 12-Lead   No orders of the defined types were placed in this encounter.   Patient Instructions  Medication Instructions:  Your physician recommends that you continue on your current medications as directed. Please refer to the Current Medication list given to you today.  *If you need a refill on your cardiac medications before your next appointment, please call your pharmacy*   Follow-Up: At [facility], you and your health needs are our priority.  As part of our continuing mission to provide you with exceptional heart care, we have created designated Provider Care Teams.  These Care Teams include your primary Cardiologist (physician) and Advanced Practice Providers (APPs -  Physician Assistants and Nurse Practitioners) who all work together to provide you with the care you need, when you need it.  We recommend signing up for the patient portal called "MyChart".  Sign up information is provided on this After Visit Summary.  MyChart is used to connect with patients for Virtual Visits (Telemedicine).  Patients are able to view lab/test results, encounter notes, upcoming appointments, etc.  Non-urgent messages can be sent to your provider as well.   To learn more about what you can do with MyChart, go to [email or web address].    Your next appointment:   6 month(s)  The format for your next appointment:   In Person  Provider:   Oswaldo Milian, MD        Signed, Donato Heinz, MD  02/22/2020 8:34 AM    [place]

## 2020-02-22 ENCOUNTER — Encounter: Payer: Self-pay | Admitting: Cardiology

## 2020-02-22 ENCOUNTER — Ambulatory Visit (INDEPENDENT_AMBULATORY_CARE_PROVIDER_SITE_OTHER): Payer: 59 | Admitting: Cardiology

## 2020-02-22 ENCOUNTER — Other Ambulatory Visit: Payer: Self-pay

## 2020-02-22 VITALS — BP 118/82 | HR 88 | Temp 98.3°F | Ht 73.0 in | Wt 240.0 lb

## 2020-02-22 DIAGNOSIS — E785 Hyperlipidemia, unspecified: Secondary | ICD-10-CM

## 2020-02-22 DIAGNOSIS — I10 Essential (primary) hypertension: Secondary | ICD-10-CM | POA: Diagnosis not present

## 2020-02-22 DIAGNOSIS — I2510 Atherosclerotic heart disease of native coronary artery without angina pectoris: Secondary | ICD-10-CM

## 2020-02-22 NOTE — Patient Instructions (Signed)

## 2020-02-24 ENCOUNTER — Ambulatory Visit (INDEPENDENT_AMBULATORY_CARE_PROVIDER_SITE_OTHER): Payer: 59 | Admitting: Physician Assistant

## 2020-02-24 ENCOUNTER — Other Ambulatory Visit: Payer: Self-pay

## 2020-02-24 VITALS — BP 106/73 | HR 95 | Temp 98.5°F | Ht 73.0 in | Wt 237.0 lb

## 2020-02-24 DIAGNOSIS — E119 Type 2 diabetes mellitus without complications: Secondary | ICD-10-CM

## 2020-02-24 DIAGNOSIS — E669 Obesity, unspecified: Secondary | ICD-10-CM | POA: Diagnosis not present

## 2020-02-24 DIAGNOSIS — Z6831 Body mass index (BMI) 31.0-31.9, adult: Secondary | ICD-10-CM

## 2020-02-24 DIAGNOSIS — E7849 Other hyperlipidemia: Secondary | ICD-10-CM

## 2020-02-25 ENCOUNTER — Other Ambulatory Visit: Payer: Self-pay | Admitting: Internal Medicine

## 2020-02-28 NOTE — Progress Notes (Signed)
Chief Complaint:   OBESITY NED C Knezevic is here to discuss his progress with his obesity treatment plan along with follow-up of his obesity related diagnoses. Zalmen is on the Category 4 Plan (modified) and states he is following his eating plan approximately 50% of the time. Jordy states he is walking 30-40 minutes 2 times per week.  Today's visit was #: 13 Starting weight: 249 lbs Starting date: 01/28/2018 Today's weight: 237 lbs Today's date: 02/24/2020 Total lbs lost to date: 12 Total lbs lost since last in-office visit: 4  Interim History: Leib states that Ozempic has been making him nauseous and he has had multiple episodes of vomiting. He is following up with his endocrinologist.  Subjective:   Type 2 diabetes mellitus without complication, without long-term current use of insulin ([place]). Younis is on Ozempic, metformin, and Wilder Glade and is managed by his endocrinologist. Fasting blood sugars range between 100 and 128. No hypoglycemia.  Lab Results  Component Value Date   HGBA1C 10.1 (H) 12/15/2019   HGBA1C 7.0 (H) 06/28/2019   HGBA1C 7.1 (H) 06/22/2019   Lab Results  Component Value Date   LDLCALC 73 12/27/2019   CREATININE 1.06 12/27/2019   Lab Results  Component Value Date   INSULIN 27.6 (H) 12/27/2019   INSULIN CANCELED 12/15/2019   INSULIN 17.6 06/22/2019   INSULIN 11.6 05/13/2018   INSULIN 10.5 01/28/2018   Other hyperlipidemia. Johntavious is on Lipitor. No chest pain or myalgias.   Lab Results  Component Value Date   CHOL 139 12/27/2019   HDL 46 12/27/2019   LDLCALC 73 12/27/2019   TRIG 110 12/27/2019   CHOLHDL 2.8 06/28/2019   Lab Results  Component Value Date   ALT 37 12/27/2019   AST 25 12/27/2019   ALKPHOS 79 12/27/2019   BILITOT 0.4 12/27/2019   The ASCVD Risk score Mikey Bussing DC Jr., et al., 2013) failed to calculate for the following reasons:   Unable to determine if patient is Non-Hispanic African American  Assessment/Plan:   Type 2  diabetes mellitus without complication, without long-term current use of insulin ([place]). Good blood sugar control is important to decrease the likelihood of diabetic complications such as nephropathy, neuropathy, limb loss, blindness, coronary artery disease, and death. Intensive lifestyle modification including diet, exercise and weight loss are the first line of treatment for diabetes. Issai will continue his medications as directed.  Other hyperlipidemia. Cardiovascular risk and specific lipid/LDL goals reviewed.  We discussed several lifestyle modifications today and Raymond will continue to work on diet, exercise and weight loss efforts. Orders and follow up as documented in patient record. Gale will continue his medication as directed.  Counseling Intensive lifestyle modifications are the first line treatment for this issue.  Dietary changes: Increase soluble fiber. Decrease simple carbohydrates.  Exercise changes: Moderate to vigorous-intensity aerobic activity 150 minutes per week if tolerated.  Lipid-lowering medications: see documented in medical record.  Class 1 obesity with serious comorbidity and body mass index (BMI) of 31.0 to 31.9 in adult, unspecified obesity type.  Ashwath is currently in the action stage of change. As such, his goal is to continue with weight loss efforts. He has agreed to the Category 4 Plan (modified).   Exercise goals: For substantial health benefits, adults should do at least 150 minutes (2 hours and 30 minutes) a week of moderate-intensity, or 75 minutes (1 hour and 15 minutes) a week of vigorous-intensity aerobic physical activity, or an equivalent combination of moderate- and vigorous-intensity aerobic  activity. Aerobic activity should be performed in episodes of at least 10 minutes, and preferably, it should be spread throughout the week.  Behavioral modification strategies: meal planning and cooking strategies and keeping healthy foods in the  home.  Ayo has agreed to follow-up with our clinic in 2-3 weeks. He was informed of the importance of frequent follow-up visits to maximize his success with intensive lifestyle modifications for his multiple health conditions.   Objective:   Blood pressure 106/73, pulse 95, temperature 98.5 F (36.9 C), temperature source Oral, height 6\' 1"  (1.854 m), weight 237 lb (107.5 kg), SpO2 98 %. Body mass index is 31.27 kg/m.  General: Cooperative, alert, well developed, in no acute distress. HEENT: Conjunctivae and lids unremarkable. Cardiovascular: Regular rhythm.  Lungs: Normal work of breathing. Neurologic: No focal deficits.   Lab Results  Component Value Date   CREATININE 1.06 12/27/2019   BUN 20 12/27/2019   NA 141 12/27/2019   K 3.9 12/27/2019   CL 102 12/27/2019   CO2 21 12/27/2019   Lab Results  Component Value Date   ALT 37 12/27/2019   AST 25 12/27/2019   ALKPHOS 79 12/27/2019   BILITOT 0.4 12/27/2019   Lab Results  Component Value Date   HGBA1C 10.1 (H) 12/15/2019   HGBA1C 7.0 (H) 06/28/2019   HGBA1C 7.1 (H) 06/22/2019   HGBA1C 6.3 (H) 06/22/2018   HGBA1C 6.2 (H) 01/28/2018   Lab Results  Component Value Date   INSULIN 27.6 (H) 12/27/2019   INSULIN CANCELED 12/15/2019   INSULIN 17.6 06/22/2019   INSULIN 11.6 05/13/2018   INSULIN 10.5 01/28/2018   Lab Results  Component Value Date   TSH 0.894 12/27/2019   Lab Results  Component Value Date   CHOL 139 12/27/2019   HDL 46 12/27/2019   LDLCALC 73 12/27/2019   TRIG 110 12/27/2019   CHOLHDL 2.8 06/28/2019   Lab Results  Component Value Date   WBC 6.5 06/28/2019   HGB 14.9 06/28/2019   HCT 44.5 06/28/2019   MCV 83.5 06/28/2019   PLT 293 06/28/2019   No results found for: IRON, TIBC, FERRITIN  Attestation Statements:   Reviewed by clinician on day of visit: allergies, medications, problem list, medical history, surgical history, family history, social history, and previous encounter  notes.  Time spent on visit including pre-visit chart review and post-visit charting and care was 30 minutes.   IMichaelene Song, am acting as transcriptionist for Abby Potash, PA-C   I have reviewed the above documentation for accuracy and completeness, and I agree with the above. Abby Potash, PA-C

## 2020-03-03 IMAGING — CT CT ABD-PELV W/ CM
2 of 5 series · 15 of 46 positions shown, 17 images · IV contrast (iopamidol)
Comparison: None.

CLINICAL DATA: Right lower quadrant pain since [REDACTED], nausea,
diarrhea, vomiting

EXAM:
CT ABDOMEN AND PELVIS WITH CONTRAST
TECHNIQUE: Multidetector CT imaging of the abdomen and pelvis was performed
using the standard protocol following bolus administration of
intravenous contrast.
CONTRAST:  100mL 154BDC-FYY IOPAMIDOL (154BDC-FYY) INJECTION 61%

[Series 2: routine abd/pel with · axial · 0.77mm/px · z∈[-1054,-604]mm · 12 of 103 slices shown, 14 images]
[im 7/103  soft-tissue]
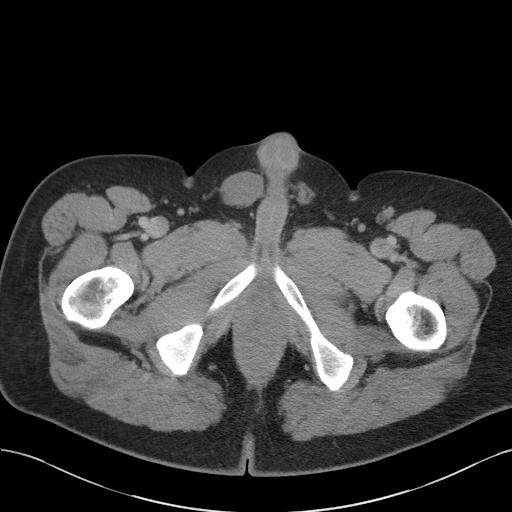
[im 7/103  bone]
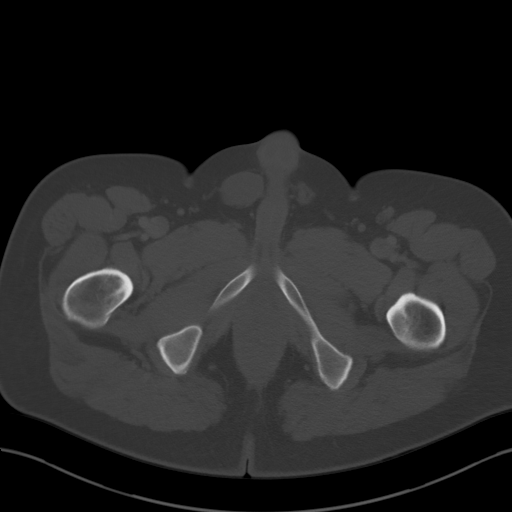
[im 19/103  soft-tissue]
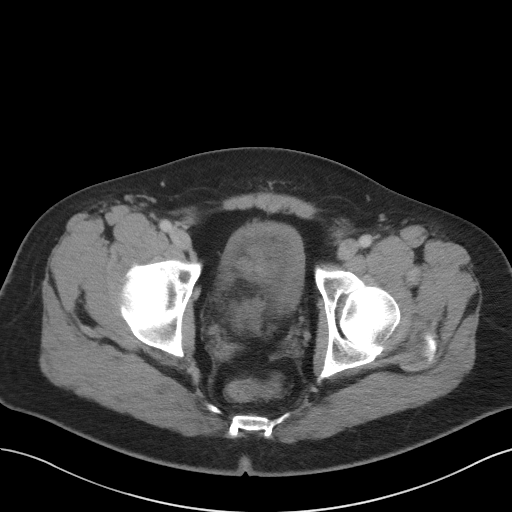
[im 25/103  soft-tissue]
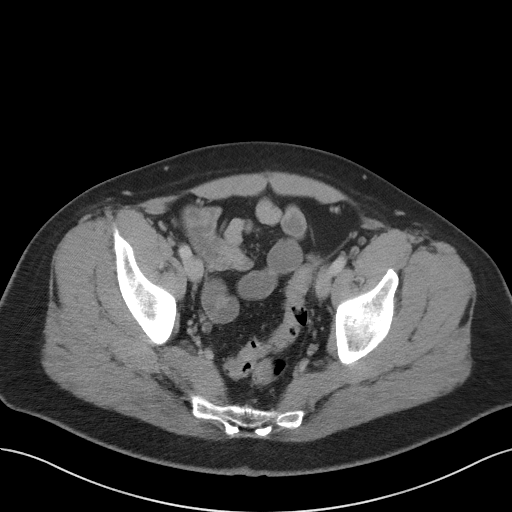
[im 31/103  soft-tissue]
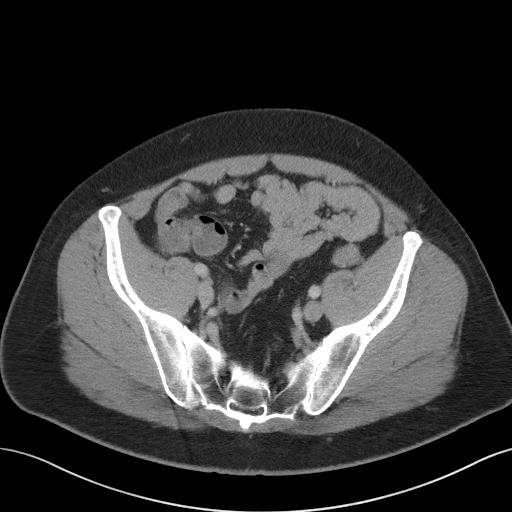
[im 43/103  soft-tissue]
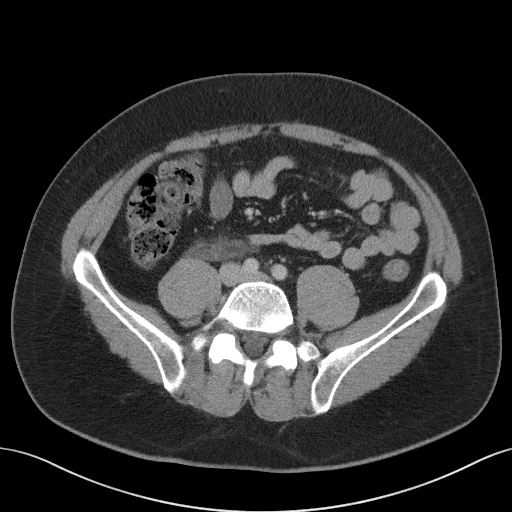
[im 49/103  soft-tissue]
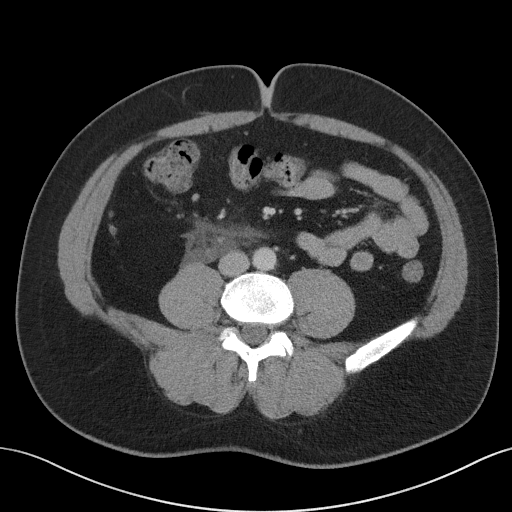
[im 55/103  soft-tissue]
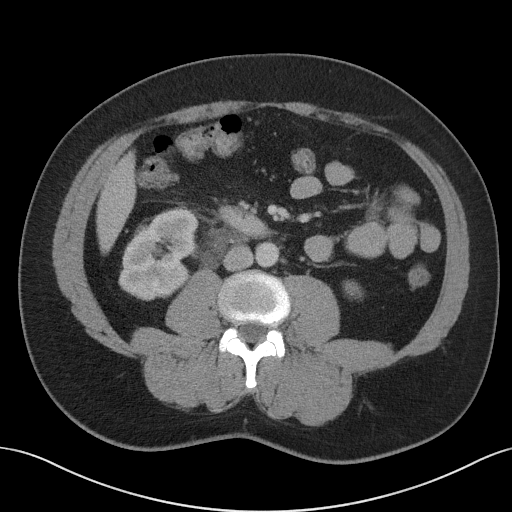
[im 67/103  soft-tissue]
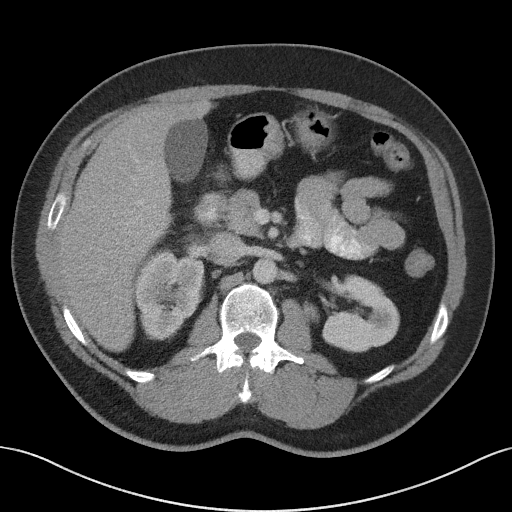
[im 73/103  soft-tissue]
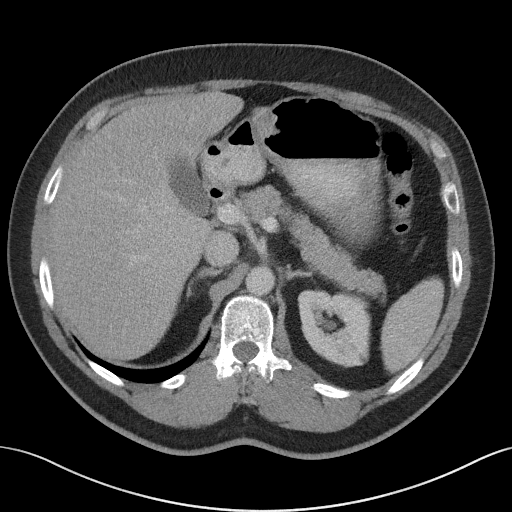
[im 73/103  bone]
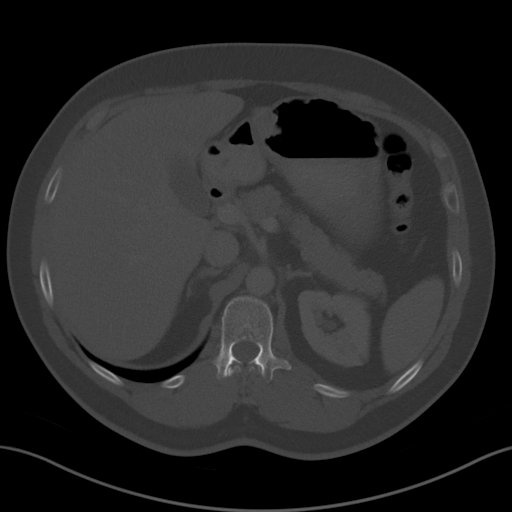
[im 79/103  soft-tissue]
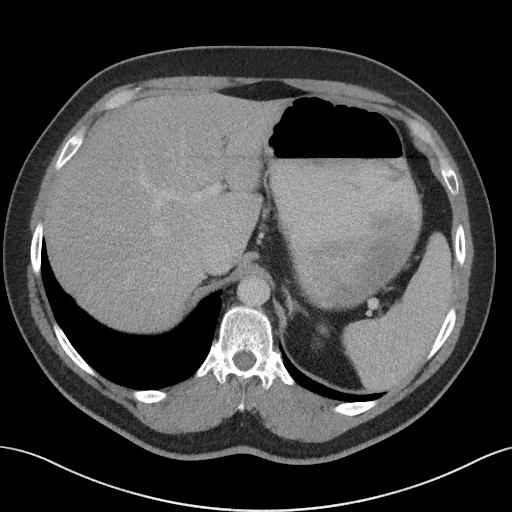
[im 91/103  soft-tissue]
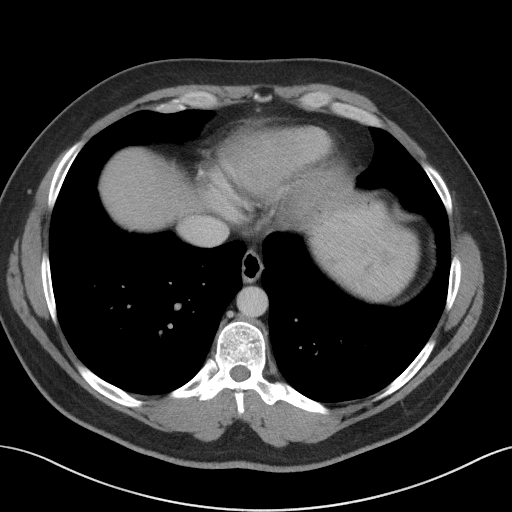
[im 97/103  soft-tissue]
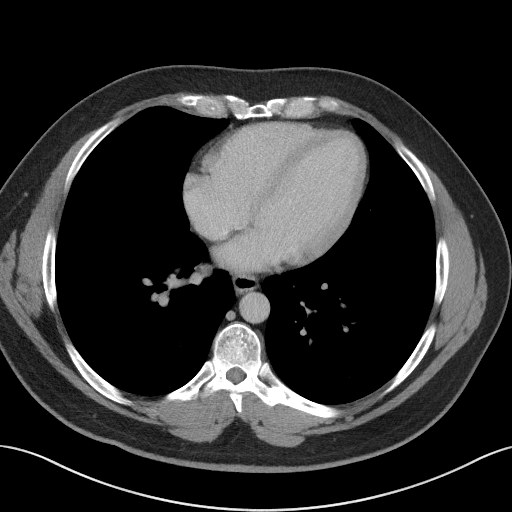

[Series 5: coronal st · coronal · 0.76mm/px · 3 of 108 slices shown]
[im 36/108  soft-tissue]
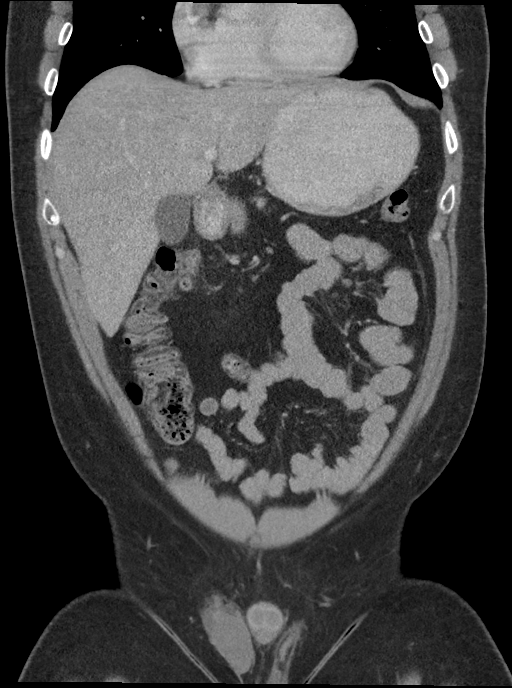
[im 48/108  soft-tissue]
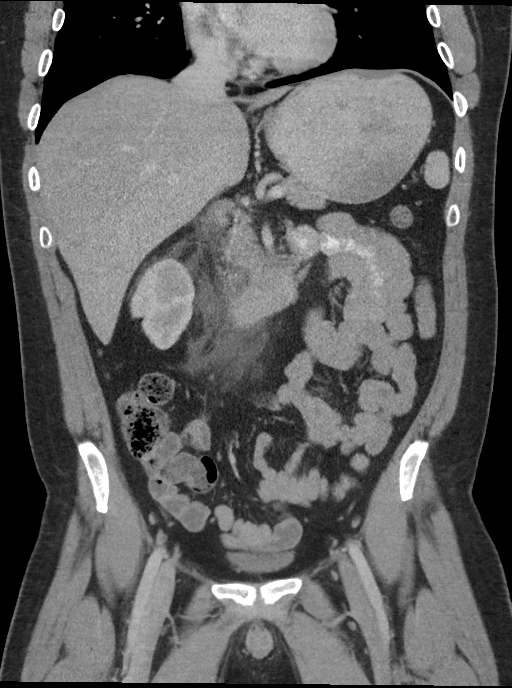
[im 60/108  soft-tissue]
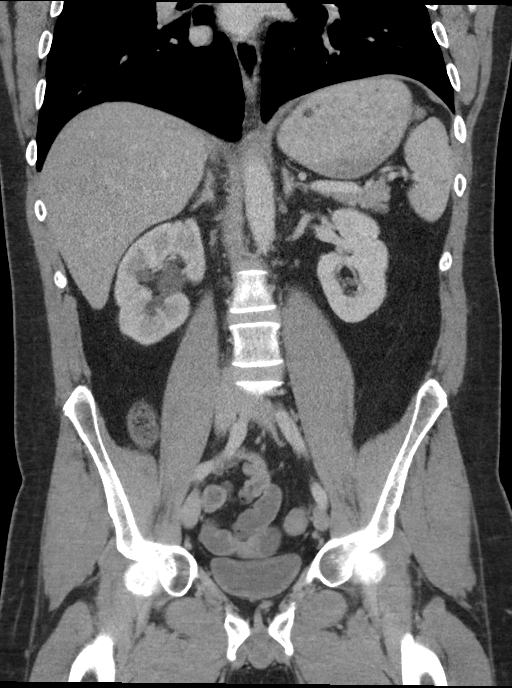

[15 of 46 positions shown; findings below may reference images not displayed]

FINDINGS: Lower chest: 3 mm triangular subpleural nodule in the left lower
lobe (series 4/image 7).

Hepatobiliary: Liver is within normal limits.

Gallbladder is unremarkable. No intrahepatic or extrahepatic ductal
dilatation.

Pancreas: Within normal limits.

Spleen: Within normal limits.

Adrenals/Urinary Tract: Adrenal glands are within normal limits.

2.5 cm left lower pole renal cyst (series 2/image 39). Mild right
hydroureteronephrosis. Right perinephric/periureteral
fluid/stranding. Associated 3 mm distal right ureteral calculus just
above the UVJ (coronal image 74; axial image 86).

Additional 3-4 mm calcification is favored to be just posterior to
the distal right ureter (series 2/image 84), favoring a calcified
phlebolith, although an additional distal ureteral calculus is
possible.

Bladder is underdistended but unremarkable.

Stomach/Bowel: Stomach is mildly distended but grossly unremarkable.

No evidence of bowel obstruction.

Normal appendix (series 2/image 56).

Vascular/Lymphatic: No evidence of abdominal aortic aneurysm.

No suspicious abdominopelvic lymphadenopathy.

Reproductive: Prostate is unremarkable.

Other: No abdominopelvic ascites.

Musculoskeletal: Visualized osseous structures are within normal
limits.
IMPRESSION: 3 mm distal right ureteral calculus just above the UVJ. Associated
mild right hydroureteronephrosis.

3 mm triangular subpleural nodule in the left lower lobe, likely
reflecting a benign subpleural lymph node. No follow-up needed if
patient is low-risk. Non-contrast chest CT can be considered in 12
months if patient is high-risk. This recommendation follows the
consensus statement: Guidelines for Management of Incidental
Pulmonary Nodules Detected on CT Images: From the [HOSPITAL]

Additional ancillary findings as above.

## 2020-03-16 ENCOUNTER — Ambulatory Visit (INDEPENDENT_AMBULATORY_CARE_PROVIDER_SITE_OTHER): Payer: 59 | Admitting: Physician Assistant

## 2020-04-04 ENCOUNTER — Ambulatory Visit (INDEPENDENT_AMBULATORY_CARE_PROVIDER_SITE_OTHER): Payer: 59 | Admitting: Physician Assistant

## 2020-04-04 ENCOUNTER — Other Ambulatory Visit: Payer: Self-pay

## 2020-04-04 ENCOUNTER — Encounter (INDEPENDENT_AMBULATORY_CARE_PROVIDER_SITE_OTHER): Payer: Self-pay | Admitting: Physician Assistant

## 2020-04-04 VITALS — BP 93/60 | HR 77 | Temp 97.9°F | Ht 73.0 in | Wt 237.0 lb

## 2020-04-04 DIAGNOSIS — E1165 Type 2 diabetes mellitus with hyperglycemia: Secondary | ICD-10-CM | POA: Diagnosis not present

## 2020-04-04 DIAGNOSIS — E669 Obesity, unspecified: Secondary | ICD-10-CM

## 2020-04-04 DIAGNOSIS — E559 Vitamin D deficiency, unspecified: Secondary | ICD-10-CM | POA: Diagnosis not present

## 2020-04-04 DIAGNOSIS — Z6831 Body mass index (BMI) 31.0-31.9, adult: Secondary | ICD-10-CM

## 2020-04-04 DIAGNOSIS — Z9189 Other specified personal risk factors, not elsewhere classified: Secondary | ICD-10-CM | POA: Diagnosis not present

## 2020-04-04 DIAGNOSIS — E1169 Type 2 diabetes mellitus with other specified complication: Secondary | ICD-10-CM | POA: Diagnosis not present

## 2020-04-04 MED ORDER — VITAMIN D (ERGOCALCIFEROL) 1.25 MG (50000 UNIT) PO CAPS: 50000.0000 [IU] | ORAL_CAPSULE | ORAL | 0 refills | Status: DC

## 2020-04-05 LAB — VITAMIN D 25 HYDROXY (VIT D DEFICIENCY, FRACTURES): Vit D, 25-Hydroxy: 36.1 ng/mL (ref 30.0–100.0)

## 2020-04-05 NOTE — Progress Notes (Signed)
Chief Complaint:   OBESITY NED C [name] is here to discuss his progress with his obesity treatment plan along with follow-up of his obesity related diagnoses. [name] is on the Category 4 Plan and states he is following his eating plan approximately 80-90% of the time. [name] states he is walking 30-40 minutes 2 times per week.  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  Subjective:   Type 2 diabetes mellitus with other specified complication, without long-term current use of insulin ([place]). A1c was reported to be 7.2 on endocrinologist's office today. [name] is on Ozempic. No nausea, vomiting, or diarrhea.    Lab Results  Component Value Date   HGBA1C 10.1 (H) 12/15/2019   HGBA1C 7.0 (H) 06/28/2019   HGBA1C 7.1 (H) 06/22/2019   Lab Results  Component Value Date   LDLCALC 73 12/27/2019   CREATININE 1.06 12/27/2019   Lab Results  Component Value Date   INSULIN 27.6 (H) 12/27/2019   INSULIN CANCELED 12/15/2019   INSULIN 17.6 06/22/2019   INSULIN 11.6 05/13/2018   INSULIN 10.5 01/28/2018   Vitamin D deficiency. [name] is on prescription Vitamin D supplementation. No nausea, vomiting, or muscle weakness.    Ref. Range 12/27/2019 10:08  Vitamin D, 25-Hydroxy Latest Ref Range: 30.0 - 100.0 ng/mL 46.0   At risk for heart disease. [name] is at a higher than average risk for cardiovascular disease due to obesity.   Assessment/Plan:   Type 2 diabetes mellitus with other specified complication, without long-term current use of insulin ([place]). Good blood sugar control is important to decrease the likelihood of diabetic complications such as nephropathy, neuropathy,  limb loss, blindness, coronary artery disease, and death. Intensive lifestyle modification including diet, exercise and weight loss are the first line of treatment for diabetes. [name] will continue his medication as directed.   Vitamin D deficiency. Low Vitamin D level contributes to fatigue and are associated with obesity, breast, and colon cancer. He was given a refill on his Vitamin D, Ergocalciferol, (DRISDOL) 1.25 MG (50000 UNIT) CAPS capsule 2 times a week #10 with 0 refills and VITAMIN D 25 Hydroxy (Vit-D Deficiency, Fractures) level will be checked today.  At risk for heart disease. [name] was given approximately 15 minutes of coronary artery disease prevention counseling today. He is 44 y.o. male and has risk factors for heart disease including obesity. We discussed intensive lifestyle modifications today with an emphasis on specific weight loss instructions and strategies.   Repetitive spaced learning was employed today to elicit superior memory formation and behavioral change.  Class 1 obesity with serious comorbidity and body mass index (BMI) of 31.0 to 31.9 in adult, unspecified obesity type.  [name] is currently in the action stage of change. As such, his goal is to continue with weight loss efforts. He has agreed to the Category 4 Plan.   Exercise goals: For substantial health benefits, adults should do at least 150 minutes (2 hours and 30 minutes) a week of moderate-intensity, or 75 minutes (1 hour and 15 minutes) a week of vigorous-intensity aerobic physical activity, or an equivalent combination of moderate- and vigorous-intensity aerobic activity. Aerobic activity should be performed in episodes of  at least 10 minutes, and preferably, it should be spread throughout the week.  Behavioral modification strategies: no skipping meals and keeping healthy foods in the home.  [name] has agreed to follow-up with our clinic in 3 weeks. He was informed of the importance of frequent follow-up  visits to maximize his success with intensive lifestyle modifications for his multiple health conditions.   [name] was informed we would discuss his lab results at his next visit unless there is a critical issue that needs to be addressed sooner. [name] agreed to keep his next visit at the agreed upon time to discuss these results.  Objective:   Blood pressure 93/60, pulse 77, temperature 97.9 F (36.6 C), temperature source Oral, height 6\' 1"  (1.854 m), weight 237 lb (107.5 kg), SpO2 99 %. Body mass index is 31.27 kg/m.  General: Cooperative, alert, well developed, in no acute distress. HEENT: Conjunctivae and lids unremarkable. Cardiovascular: Regular rhythm.  Lungs: Normal work of breathing. Neurologic: No focal deficits.   Lab Results  Component Value Date   CREATININE 1.06 12/27/2019   BUN 20 12/27/2019   NA 141 12/27/2019   K 3.9 12/27/2019   CL 102 12/27/2019   CO2 21 12/27/2019   Lab Results  Component Value Date   ALT 37 12/27/2019   AST 25 12/27/2019   ALKPHOS 79 12/27/2019   BILITOT 0.4 12/27/2019   Lab Results  Component Value Date   HGBA1C 10.1 (H) 12/15/2019   HGBA1C 7.0 (H) 06/28/2019   HGBA1C 7.1 (H) 06/22/2019   HGBA1C 6.3 (H) 06/22/2018   HGBA1C 6.2 (H) 01/28/2018   Lab Results  Component Value Date   INSULIN 27.6 (H) 12/27/2019   INSULIN CANCELED 12/15/2019   INSULIN 17.6 06/22/2019   INSULIN 11.6 05/13/2018   INSULIN 10.5 01/28/2018   Lab Results  Component Value Date   TSH 0.894 12/27/2019   Lab Results  Component Value Date   CHOL 139 12/27/2019   HDL 46 12/27/2019   LDLCALC 73 12/27/2019   TRIG 110 12/27/2019   CHOLHDL 2.8 06/28/2019   Lab Results  Component Value Date   WBC 6.5 06/28/2019   HGB 14.9 06/28/2019   HCT 44.5 06/28/2019   MCV 83.5 06/28/2019   PLT 293 06/28/2019   No results found for: IRON, TIBC, FERRITIN  Attestation Statements:   Reviewed by clinician on day of visit: allergies, medications, problem list,  medical history, surgical history, family history, social history, and previous encounter notes.  IMichaelene Song, am acting as transcriptionist for Abby Potash, PA-C   I have reviewed the above documentation for accuracy and completeness, and I agree with the above. Abby Potash, PA-C

## 2020-04-11 DIAGNOSIS — I10 Essential (primary) hypertension: Secondary | ICD-10-CM | POA: Diagnosis not present

## 2020-04-11 DIAGNOSIS — E119 Type 2 diabetes mellitus without complications: Secondary | ICD-10-CM | POA: Diagnosis not present

## 2020-04-17 ENCOUNTER — Ambulatory Visit (INDEPENDENT_AMBULATORY_CARE_PROVIDER_SITE_OTHER): Payer: 59 | Admitting: Dermatology

## 2020-04-17 ENCOUNTER — Other Ambulatory Visit: Payer: Self-pay | Admitting: Cardiology

## 2020-04-17 ENCOUNTER — Other Ambulatory Visit: Payer: Self-pay

## 2020-04-17 DIAGNOSIS — D492 Neoplasm of unspecified behavior of bone, soft tissue, and skin: Secondary | ICD-10-CM

## 2020-04-17 DIAGNOSIS — L578 Other skin changes due to chronic exposure to nonionizing radiation: Secondary | ICD-10-CM

## 2020-04-17 DIAGNOSIS — D1800 Hemangioma unspecified site: Secondary | ICD-10-CM | POA: Diagnosis not present

## 2020-04-17 DIAGNOSIS — L821 Other seborrheic keratosis: Secondary | ICD-10-CM

## 2020-04-17 DIAGNOSIS — D225 Melanocytic nevi of trunk: Secondary | ICD-10-CM | POA: Diagnosis not present

## 2020-04-17 DIAGNOSIS — L820 Inflamed seborrheic keratosis: Secondary | ICD-10-CM

## 2020-04-17 DIAGNOSIS — L720 Epidermal cyst: Secondary | ICD-10-CM | POA: Diagnosis not present

## 2020-04-17 DIAGNOSIS — L814 Other melanin hyperpigmentation: Secondary | ICD-10-CM | POA: Diagnosis not present

## 2020-04-17 DIAGNOSIS — D485 Neoplasm of uncertain behavior of skin: Secondary | ICD-10-CM

## 2020-04-17 DIAGNOSIS — Z1283 Encounter for screening for malignant neoplasm of skin: Secondary | ICD-10-CM

## 2020-04-17 NOTE — Patient Instructions (Signed)
Pre-Operative Instructions  You are scheduled for a surgical procedure at [facility]. We recommend you read the following instructions. If you have any questions or concerns, please call the office at [phone].  1. Shower and wash the entire body with soap and water the day of your surgery paying special attention to cleansing at and around the planned surgery site.  2. Avoid aspirin or aspirin containing products at least fourteen (14) days prior to your surgical procedure and for at least one week (7 Days) after your surgical procedure. If you take aspirin on a regular basis for heart disease or history of stroke or for any other reason, we may recommend you continue taking aspirin but please notify us if you take this on a regular basis. Aspirin can cause more bleeding to occur during surgery as well as prolonged bleeding and bruising after surgery.   3. Avoid other nonsteroidal pain medications at least one week prior to surgery and at least one week prior to your surgery. These include medications such as Ibuprofen (Motrin, Advil and Nuprin), Naprosyn, Voltaren, Relafen, etc. If medications are used for therapeutic reasons, please inform us as they can cause increased bleeding or prolonged bleeding during and bruising after surgical procedures.   4. Please advice [name] if you are taking any "blood thinner" medications such as Coumadin or Dipyridamole or Plavix or similar medications. These cause increased bleeding and prolonged bleeding during and bruising after surgical procedures. We may have to consider discontinuing these medications briefly prior to and shortly after your surgery, if safe to do so.   5. Please inform us of all medications you are currently taking. All medications that are taken regularly should be taken the day of surgery as you always do. Nevertheless, we need to be informed of what medications you are taking prior to surgery to whether they will affect the  procedure or cause any complications.   6. Please inform us of any medication allergies. Also inform us of whether you have allergies to Latex or rubber products or whether you have had any adverse reaction to Lidocaine or Epinephrine.  7. Please inform us of any prosthetic or artificial body parts such as artificial heart valve, joint replacements, etc., or similar condition that might require preoperative antibiotics.   8. We recommend avoidance of alcohol at least two weeks prior to surgery and continued avoidence for at least two weeks after surgery.   9. We recommend discontinuation of tobacco smoking at least two weeks prior to surgery and continued abstinence for at least two weeks after surgery.  10. Do not plan strenuous exercise, strenuous work or strenuous lifting for approximately four weeks after your surgery.   11. We request if you are unable to make your scheduled surgical appointment, please call us at least a week in advance or as soon as you are aware of a problem sot aht we can cancel or reschedule you.   12. You MAKE TAKE TYLENOL (acetaminophen) for pain as it is not a blood thinner.   13. PLEASE PLAN TO BE IN TOWN FOR TWO WEEKS FOLLOWING SURGERY, THIS IS IMPORTANT SO YOU CAN BE CHECKED FOR DRESSING CHANGES, SUTURE REMOVAL AND TO MONITOR FOR POSSIBLE COMPLICATIONS.  Wound Care Instructions  Cleanse wound gently with soap and water once a day then pat dry with clean gauze. Apply a thing coat of Petrolatum (petroleum jelly, "Vaseline") over the wound (unless you have an allergy to this). We recommend that you use a new,  sterile tube of Vaseline. Do not pick or remove scabs. Do not remove the yellow or white "healing tissue" from the base of the wound.  Cover the wound with fresh, clean, nonstick gauze and secure with paper tape. You may use Band-Aids in place of gauze and tape if the would is small enough, but would recommend trimming much of the tape off as there is often too  much. Sometimes Band-Aids can irritate the skin.  You should call the office for your biopsy report after 1 week if you have not already been contacted.  If you experience any problems, such as abnormal amounts of bleeding, swelling, significant bruising, significant pain, or evidence of infection, please call the office immediately.  FOR ADULT SURGERY PATIENTS: If you need something for pain relief you may take 1 extra strength Tylenol (acetaminophen) AND 2 Ibuprofen (200mg  each) together every 4 hours as needed for pain. (do not take these if you are allergic to them or if you have a reason you should not take them.) Typically, you may only need pain medication for 1 to 3 days.   14.  15.

## 2020-04-17 NOTE — Progress Notes (Signed)
   Follow-Up Visit   Subjective  [name] is a 44 y.o. male who presents for the following: skin tags (R neck, L neck, L axilla) and Cyst (R scalp, ~16yrs, growing). The patient presents for Upper Body Skin Exam (UBSE) for skin cancer screening and mole check.  The following portions of the chart were reviewed this encounter and updated as appropriate:  Tobacco  Allergies  Meds  Problems  Med Hx  Surg Hx  Fam Hx     Review of Systems:  No other skin or systemic complaints except as noted in HPI or Assessment and Plan.  Objective  Well appearing patient in no apparent distress; mood and affect are within normal limits.  A focused examination was performed including scalp, neck, axilla. Relevant physical exam findings are noted in the Assessment and Plan.  Objective  L low back lat above waistline/flank: 1.2 x 0.5cm irregular brown macule  Objective  R mastoid: 2.0cm cystic pap  Objective  R neck x 1, L chest x 1 (2): Erythematous keratotic or waxy stuck-on papule or plaque.    Assessment & Plan   Seborrheic Keratoses - Stuck-on, waxy, tan-brown papules and plaques  - Discussed benign etiology and prognosis. - Observe - Call for any changes  Hemangiomas - Red papules - Discussed benign nature - Observe - Call for any changes  Actinic Damage - diffuse scaly erythematous macules with underlying dyspigmentation - Recommend daily broad spectrum sunscreen SPF 30+ to sun-exposed areas, reapply every 2 hours as needed.  - Call for new or changing lesions.  Lentigines - Scattered tan macules - Discussed due to sun exposure - Benign, observe - Call for any changes  Neoplasm of skin L low back lat above waistline/flank  Epidermal / dermal shaving  Lesion diameter (cm):  1.2 Informed consent: discussed and consent obtained   Timeout: patient name, date of birth, surgical site, and procedure verified   Procedure prep:  Patient was prepped and draped in usual  sterile fashion Prep type:  Isopropyl alcohol Anesthesia: the lesion was anesthetized in a standard fashion   Anesthetic:  1% lidocaine w/ epinephrine 1-100,000 buffered w/ 8.4% NaHCO3 Instrument used: flexible razor blade   Hemostasis achieved with: pressure, aluminum chloride and electrodesiccation   Outcome: patient tolerated procedure well   Post-procedure details: sterile dressing applied and wound care instructions given   Dressing type: bandage and petrolatum    Specimen 1 - Surgical pathology Differential Diagnosis: D48.5 Nevus vs Dysplastic Nevus Check Margins: No 1.2 x 0.5cm irregular brown macule  Epidermal cyst R mastoid  Discussed excising, pt will schedule  Inflamed seborrheic keratosis (2) R neck x 1, L chest x 1  Destruction of lesion - R neck x 1, L chest x 1 Complexity: simple   Destruction method: cryotherapy   Informed consent: discussed and consent obtained   Timeout:  patient name, date of birth, surgical site, and procedure verified Lesion destroyed using liquid nitrogen: Yes   Region frozen until ice ball extended beyond lesion: Yes   Outcome: patient tolerated procedure well with no complications   Post-procedure details: wound care instructions given    Skin cancer screening  Return for to be scheduled for exc of cyst R mastoid.  I, Othelia Pulling, RMA, am acting as scribe for Sarina Ser, MD .  Documentation: I have reviewed the above documentation for accuracy and completeness, and I agree with the above.  Sarina Ser, MD

## 2020-04-18 ENCOUNTER — Encounter: Payer: Self-pay | Admitting: Dermatology

## 2020-04-18 ENCOUNTER — Other Ambulatory Visit: Payer: Self-pay | Admitting: Cardiology

## 2020-04-24 ENCOUNTER — Telehealth: Payer: Self-pay

## 2020-04-24 NOTE — Telephone Encounter (Signed)
Patient informed of results and surgery scheduled.  

## 2020-04-24 NOTE — Telephone Encounter (Signed)
-----   Message from Ralene Bathe, MD sent at 04/22/2020 12:58 PM EDT ----- Skin , left low back lat above waistline/flank DYSPLASTIC COMPOUND NEVUS WITH SEVERE ATYPIA, CLOSE TO MARGIN, SEE DESCRIPTION  Severe dysplastic Schedule surgery

## 2020-04-26 ENCOUNTER — Ambulatory Visit (INDEPENDENT_AMBULATORY_CARE_PROVIDER_SITE_OTHER): Payer: 59 | Admitting: Physician Assistant

## 2020-05-08 ENCOUNTER — Other Ambulatory Visit: Payer: Self-pay

## 2020-05-08 ENCOUNTER — Encounter (INDEPENDENT_AMBULATORY_CARE_PROVIDER_SITE_OTHER): Payer: Self-pay | Admitting: Physician Assistant

## 2020-05-08 ENCOUNTER — Ambulatory Visit (INDEPENDENT_AMBULATORY_CARE_PROVIDER_SITE_OTHER): Payer: 59 | Admitting: Physician Assistant

## 2020-05-08 VITALS — BP 108/74 | HR 78 | Temp 98.0°F | Ht 73.0 in | Wt 230.0 lb

## 2020-05-08 DIAGNOSIS — Z6830 Body mass index (BMI) 30.0-30.9, adult: Secondary | ICD-10-CM | POA: Diagnosis not present

## 2020-05-08 DIAGNOSIS — E669 Obesity, unspecified: Secondary | ICD-10-CM | POA: Diagnosis not present

## 2020-05-08 DIAGNOSIS — E559 Vitamin D deficiency, unspecified: Secondary | ICD-10-CM | POA: Diagnosis not present

## 2020-05-08 DIAGNOSIS — E1169 Type 2 diabetes mellitus with other specified complication: Secondary | ICD-10-CM | POA: Diagnosis not present

## 2020-05-08 DIAGNOSIS — E785 Hyperlipidemia, unspecified: Secondary | ICD-10-CM | POA: Diagnosis not present

## 2020-05-08 NOTE — Progress Notes (Signed)
Chief Complaint:   OBESITY [name] is here to discuss his progress with his obesity treatment plan along with follow-up of his obesity related diagnoses. [name] is on the Category 4 Plan and states he is following his eating plan approximately 80% of the time. [name] states he is walking 3.3 miles at work 5 times per week.  Today's visit was #: 63 Starting weight: 249 lbs Starting date: 01/28/2018 Today's weight: 230 lbs Today's date: 05/08/2020 Total lbs lost to date: 19 Total lbs lost since last in-office visit: 7  Interim History: [name] reports that he has changed jobs, which greatly decreased his stress. 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 He thinks he has been getting all of his protein in most days. He has had to eat out more often due to being on the road more.  Subjective:   Hyperlipidemia associated with type 2 diabetes mellitus ([place]). Last A1c was 7.2 with Endocrinology. [name] is on Ozempic, which continues to cause nausea, and is also on metformin.  Lab Results  Component Value Date   HGBA1C 10.1 (H) 12/15/2019   HGBA1C 7.0 (H) 06/28/2019   HGBA1C 7.1 (H) 06/22/2019   Lab Results  Component Value Date   LDLCALC 73 12/27/2019   CREATININE 1.06 12/27/2019   Lab Results  Component Value Date   INSULIN 27.6 (H) 12/27/2019   INSULIN CANCELED 12/15/2019   INSULIN 17.6 06/22/2019   INSULIN 11.6 05/13/2018   INSULIN 10.5 01/28/2018   Vitamin D deficiency. [name] is on Vitamin D supplementation. Last Vitamin D level was not at goal.   Ref. Range 04/04/2020 16:58  Vitamin D, 25-Hydroxy Latest Ref Range: 30.0 - 100.0 ng/mL 36.1   Assessment/Plan:   Hyperlipidemia associated with type 2 diabetes mellitus ([place]). Good blood sugar control is important to decrease the likelihood of diabetic complications such as nephropathy, neuropathy, limb loss, blindness, coronary artery disease, and death. Intensive lifestyle modification including diet, exercise and weight loss are  the first line of treatment for diabetes. [name] will follow-up with Endocrinology as scheduled.  Vitamin D deficiency. Low Vitamin D level contributes to fatigue and are associated with obesity, breast, and colon cancer. He agrees to continue to take Vitamin D as directed and will follow-up for routine testing of Vitamin D, at least 2-3 times per year to avoid over-replacement.  Class 1 obesity with serious comorbidity and body mass index (BMI) of 30.0 to 30.9 in adult, unspecified obesity type.  [name] is currently in the action stage of change. As such, his goal is to continue with weight loss efforts. He has agreed to the Category 4 Plan.   Handout was provided on Eating Out.  Exercise goals: For substantial health benefits, adults should do at least 150 minutes (2 hours and 30 minutes) a week of moderate-intensity, or 75 minutes (1 hour and 15 minutes) a week of vigorous-intensity aerobic physical activity, or an equivalent combination of moderate- and vigorous-intensity aerobic activity. Aerobic activity should be performed in episodes of at least 10 minutes, and preferably, it should be spread throughout the week.  Behavioral modification strategies: meal planning and cooking strategies and keeping healthy foods in the home.  [name] has agreed to follow-up with our clinic in 3 weeks. He was informed of the importance of frequent follow-up visits to maximize his success with intensive lifestyle modifications for his multiple health conditions.   Objective:   Blood pressure 108/74, pulse 78, temperature 98 F (36.7 C), temperature source Oral, height 6'  1" (1.854 m), weight 230 lb (104.3 kg), SpO2 98 %. Body mass index is 30.34 kg/m.  General: Cooperative, alert, well developed, in no acute distress. HEENT: Conjunctivae and lids unremarkable. Cardiovascular: Regular rhythm.  Lungs: Normal work of breathing. Neurologic: No focal deficits.   Lab Results  Component Value Date    CREATININE 1.06 12/27/2019   BUN 20 12/27/2019   NA 141 12/27/2019   K 3.9 12/27/2019   CL 102 12/27/2019   CO2 21 12/27/2019   Lab Results  Component Value Date   ALT 37 12/27/2019   AST 25 12/27/2019   ALKPHOS 79 12/27/2019   BILITOT 0.4 12/27/2019   Lab Results  Component Value Date   HGBA1C 10.1 (H) 12/15/2019   HGBA1C 7.0 (H) 06/28/2019   HGBA1C 7.1 (H) 06/22/2019   HGBA1C 6.3 (H) 06/22/2018   HGBA1C 6.2 (H) 01/28/2018   Lab Results  Component Value Date   INSULIN 27.6 (H) 12/27/2019   INSULIN CANCELED 12/15/2019   INSULIN 17.6 06/22/2019   INSULIN 11.6 05/13/2018   INSULIN 10.5 01/28/2018   Lab Results  Component Value Date   TSH 0.894 12/27/2019   Lab Results  Component Value Date   CHOL 139 12/27/2019   HDL 46 12/27/2019   LDLCALC 73 12/27/2019   TRIG 110 12/27/2019   CHOLHDL 2.8 06/28/2019   Lab Results  Component Value Date   WBC 6.5 06/28/2019   HGB 14.9 06/28/2019   HCT 44.5 06/28/2019   MCV 83.5 06/28/2019   PLT 293 06/28/2019   No results found for: IRON, TIBC, FERRITIN  Attestation Statements:   Reviewed by clinician on day of visit: allergies, medications, problem list, medical history, surgical history, family history, social history, and previous encounter notes.  Time spent on visit including pre-visit chart review and post-visit charting and care was 31 minutes.   IMichaelene Song, am acting as transcriptionist for Abby Potash, PA-C   I have reviewed the above documentation for accuracy and completeness, and I agree with the above. Abby Potash, PA-C

## 2020-05-09 ENCOUNTER — Ambulatory Visit (INDEPENDENT_AMBULATORY_CARE_PROVIDER_SITE_OTHER): Payer: 59 | Admitting: Dermatology

## 2020-05-09 ENCOUNTER — Telehealth: Payer: Self-pay

## 2020-05-09 ENCOUNTER — Encounter: Payer: Self-pay | Admitting: Dermatology

## 2020-05-09 DIAGNOSIS — D235 Other benign neoplasm of skin of trunk: Secondary | ICD-10-CM | POA: Diagnosis not present

## 2020-05-09 DIAGNOSIS — L988 Other specified disorders of the skin and subcutaneous tissue: Secondary | ICD-10-CM | POA: Diagnosis not present

## 2020-05-09 DIAGNOSIS — D239 Other benign neoplasm of skin, unspecified: Secondary | ICD-10-CM

## 2020-05-09 MED ORDER — MUPIROCIN 2 % EX OINT: 1.0000 "application " | TOPICAL_OINTMENT | Freq: Every day | CUTANEOUS | 0 refills | Status: AC

## 2020-05-09 NOTE — Progress Notes (Signed)
   Follow-Up Visit   Subjective  [name] is a 44 y.o. male who presents for the following: Bx proven severe dysplastic nevus (left low back lat above waistline/flank, pt presents for excision).  The following portions of the chart were reviewed this encounter and updated as appropriate:  Tobacco  Allergies  Meds  Problems  Med Hx  Surg Hx  Fam Hx     Review of Systems:  No other skin or systemic complaints except as noted in HPI or Assessment and Plan.  Objective  Well appearing patient in no apparent distress; mood and affect are within normal limits.  A focused examination was performed including the back. Relevant physical exam findings are noted in the Assessment and Plan.  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  Start Doryx 200mg  1/2 po bid with food and drink for 7 days, samples given, Lot [id number] exp 12/2021  Skin excision - left low back lat above waistline/flank  Lesion length (cm):  2 Lesion width (cm):  1 Margin per side (cm):  0.2 Total excision diameter (cm):  2.4 Informed consent: discussed and consent obtained   Timeout: patient name, date of birth, surgical site, and procedure verified   Procedure prep:  Patient was prepped and draped in usual sterile fashion Prep type:  Isopropyl alcohol and povidone-iodine Anesthesia: the lesion was anesthetized in a standard fashion   Anesthesia comment:  11cc Anesthetic:  1% lidocaine w/ epinephrine 1-100,000 buffered w/ 8.4% NaHCO3 Instrument used: #15 blade   Hemostasis achieved with: pressure   Hemostasis achieved with comment:  Electrocautery Outcome: patient tolerated procedure well with no complications   Post-procedure details: sterile dressing applied and wound care instructions given   Dressing type:  bandage and pressure dressing (Mupirocin)    Skin repair - left low back lat above waistline/flank Complexity:  Complex 3 cm closure Reason for type of repair: reduce tension to allow closure, reduce the risk of dehiscence, infection, and necrosis, reduce subcutaneous dead space and avoid a hematoma, allow closure of the large defect, preserve normal anatomy, preserve normal anatomical and functional relationships and enhance both functionality and cosmetic results   Undermining: area extensively undermined   Undermining comment:  Undermining Defect 1.5cc Subcutaneous layers (deep stitches):  Suture size:  2-0 Suture type: Vicryl (polyglactin 910)   Subcutaneous suture technique: Inverted Dermal. Fine/surface layer approximation (top stitches):  Suture size:  3-0 Suture type: nylon   Stitches: simple running   Stitches comment:  Nylon Suture removal (days):  7 Hemostasis achieved with: suture and pressure Outcome: patient tolerated procedure well with no complications   Post-procedure details: sterile dressing applied and wound care instructions given   Dressing type: bandage and pressure dressing (Mupirocin)    mupirocin ointment (BACTROBAN) 2 % - left low back lat above waistline/flank  Specimen 1 - Surgical pathology Differential Diagnosis: D48.5 Severe Dysplastic Nevus Check Margins: Yes Pink bx site 2.0 x 1.0 cm  [id number]  Return in about 1 week (around 05/16/2020) for suture removal.  I, Rudell Cobb, CMA, am acting as scribe for Sarina Ser, MD .  Documentation: I have reviewed the above documentation for accuracy and completeness, and I agree with the above.  Sarina Ser, MD

## 2020-05-09 NOTE — Telephone Encounter (Signed)
Patient doing well after surgery. I advised patient to contact our office should any problems arise.

## 2020-05-09 NOTE — Patient Instructions (Signed)

## 2020-05-11 ENCOUNTER — Other Ambulatory Visit: Payer: Self-pay | Admitting: Family Medicine

## 2020-05-11 DIAGNOSIS — M1990 Unspecified osteoarthritis, unspecified site: Secondary | ICD-10-CM

## 2020-05-16 ENCOUNTER — Other Ambulatory Visit: Payer: Self-pay

## 2020-05-16 ENCOUNTER — Encounter: Payer: Self-pay | Admitting: Dermatology

## 2020-05-16 ENCOUNTER — Ambulatory Visit (INDEPENDENT_AMBULATORY_CARE_PROVIDER_SITE_OTHER): Payer: 59 | Admitting: Dermatology

## 2020-05-16 DIAGNOSIS — Z4802 Encounter for removal of sutures: Secondary | ICD-10-CM

## 2020-05-16 DIAGNOSIS — L905 Scar conditions and fibrosis of skin: Secondary | ICD-10-CM

## 2020-05-16 DIAGNOSIS — L720 Epidermal cyst: Secondary | ICD-10-CM

## 2020-05-16 NOTE — Patient Instructions (Signed)

## 2020-05-16 NOTE — Progress Notes (Signed)
   Follow-Up Visit   Subjective  NED C Terrance is a 44 y.o. male who presents for the following: Suture / Staple Removal (left lower back, scattered atypical melanocytes, margins free).  The following portions of the chart were reviewed this encounter and updated as appropriate: Tobacco  Allergies  Meds  Problems  Med Hx  Surg Hx  Fam Hx     Review of Systems: No other skin or systemic complaints except as noted in HPI or Assessment and Plan.   Objective  Well appearing patient in no apparent distress; mood and affect are within normal limits.  A focused examination was performed including back. Relevant physical exam findings are noted in the Assessment and Plan.  Objective  Left Lower Back: scattered atypical melanocytes, margins free  Objective  R mastoid: 2.0 cystic pap  Assessment & Plan  Severe dysplastic nevus - margins clear Left Lower Back  Encounter for Removal of Sutures - Incision site at the left lower back is clean, dry and intact - Wound cleansed, sutures removed, wound cleansed and steri strips applied.  - Discussed pathology results showing scattered atypical melanocytes, margins free  - Patient advised to keep steri-strips dry until they fall off. - Scars remodel for a full year. - Once steri-strips fall off, patient can apply over-the-counter silicone scar cream each night to help with scar remodeling if desired. - Patient advised to call with any concerns or if they notice any new or changing lesions.    Epidermal cyst R mastoid Patient is scheduled for cyst excision on 06/06/20.   Discussed doing a total body exam at this visit.   Return in 3 weeks (on 06/06/2020) for cyst excision and TBSE.   IHarriett Sine, CMA, am acting as scribe for Sarina Ser, MD.  Documentation: I have reviewed the above documentation for accuracy and completeness, and I agree with the above.  Sarina Ser, MD

## 2020-05-20 ENCOUNTER — Encounter: Payer: Self-pay | Admitting: Dermatology

## 2020-05-22 ENCOUNTER — Other Ambulatory Visit (INDEPENDENT_AMBULATORY_CARE_PROVIDER_SITE_OTHER): Payer: Self-pay | Admitting: Physician Assistant

## 2020-05-22 DIAGNOSIS — E559 Vitamin D deficiency, unspecified: Secondary | ICD-10-CM

## 2020-05-23 ENCOUNTER — Other Ambulatory Visit (INDEPENDENT_AMBULATORY_CARE_PROVIDER_SITE_OTHER): Payer: Self-pay

## 2020-05-23 MED ORDER — VITAMIN D (ERGOCALCIFEROL) 1.25 MG (50000 UNIT) PO CAPS: 50000.0000 [IU] | ORAL_CAPSULE | ORAL | 0 refills | Status: AC

## 2020-05-23 NOTE — Progress Notes (Signed)
Rx refill approved per Dr.Beasley.

## 2020-05-29 ENCOUNTER — Other Ambulatory Visit: Payer: Self-pay

## 2020-05-29 ENCOUNTER — Ambulatory Visit (INDEPENDENT_AMBULATORY_CARE_PROVIDER_SITE_OTHER): Payer: 59 | Admitting: Physician Assistant

## 2020-05-29 ENCOUNTER — Encounter (INDEPENDENT_AMBULATORY_CARE_PROVIDER_SITE_OTHER): Payer: Self-pay | Admitting: Physician Assistant

## 2020-05-29 VITALS — BP 103/68 | HR 74 | Temp 98.0°F | Ht 73.0 in | Wt 225.0 lb

## 2020-05-29 DIAGNOSIS — E785 Hyperlipidemia, unspecified: Secondary | ICD-10-CM | POA: Diagnosis not present

## 2020-05-29 DIAGNOSIS — E559 Vitamin D deficiency, unspecified: Secondary | ICD-10-CM

## 2020-05-29 DIAGNOSIS — Z6830 Body mass index (BMI) 30.0-30.9, adult: Secondary | ICD-10-CM | POA: Diagnosis not present

## 2020-05-29 DIAGNOSIS — E669 Obesity, unspecified: Secondary | ICD-10-CM | POA: Diagnosis not present

## 2020-05-29 DIAGNOSIS — E1169 Type 2 diabetes mellitus with other specified complication: Secondary | ICD-10-CM

## 2020-05-29 NOTE — Progress Notes (Signed)
Chief Complaint:   OBESITY [name] is here to discuss his progress with his obesity treatment plan along with follow-up of his obesity related diagnoses. [name] is on the Category 4 Plan and states he is following his eating plan approximately 80-85% of the time. [name] states he is walking for 45-60 minutes 3-4 times per week.  Today's visit was #: 59 Starting weight: 249 lbs Starting date: 01/28/2018 Today's weight: 225 lbs Today's date: 05/29/2020 Total lbs lost to date: 24 Total lbs lost since last in-office visit: 5  Interim History: [name] has been packing his lunch and taking it with him on the road when working. His hunger is controlled. He notes having some nausea with vomiting recently and is unsure whether or not it is due to a GI virus or his Ozempic.  Subjective:   1. Vitamin D deficiency [name] is on Vit D twice weekly. Last Vit D level was not at goal.  2. Hyperlipidemia associated with type 2 diabetes mellitus (HCC) [name] sees Dr. Gabriel Carina, Endocrinologist. Her fasting BGs range between 84 and 100. She denies hypoglycemia.  Assessment/Plan:   1. Vitamin D deficiency Low Vitamin D level contributes to fatigue and are associated with obesity, breast, and colon cancer. [name] agreed to continue taking prescription Vitamin D 50,000 IU twice weekly and will add Vit D lab order for his primary care physician at his next visit. He will follow-up for routine testing of Vitamin D, at least 2-3 times per year to avoid over-replacement.  2. Hyperlipidemia associated with type 2 diabetes mellitus ([place]) Cardiovascular risk and specific lipid/LDL goals reviewed. We discussed several lifestyle modifications today and [name] will continue to work on diet, exercise and weight loss efforts. [name] will continue his medications and will continue to follow up with Endocrinology. Orders and follow up as documented in patient record.   Counseling Intensive lifestyle modifications are the  first line treatment for this issue.  Dietary changes: Increase soluble fiber. Decrease simple carbohydrates.  Exercise changes: Moderate to vigorous-intensity aerobic activity 150 minutes per week if tolerated.  Lipid-lowering medications: see documented in medical record.  3. Class 1 obesity with serious comorbidity and body mass index (BMI) of 30.0 to 30.9 in adult, unspecified obesity type [name] is currently in the action stage of change. As such, his goal is to continue with weight loss efforts. He has agreed to the Category 4 Plan.   Exercise goals: As is.  Behavioral modification strategies: travel eating strategies and avoiding temptations.  [name] has agreed to follow-up with our clinic in 3 weeks. He was informed of the importance of frequent follow-up visits to maximize his success with intensive lifestyle modifications for his multiple health conditions.   Objective:   Blood pressure 103/68, pulse 74, temperature 98 F (36.7 C), temperature source Oral, height 6\' 1"  (1.854 m), weight 225 lb (102.1 kg), SpO2 99 %. Body mass index is 29.69 kg/m.  General: Cooperative, alert, well developed, in no acute distress. HEENT: Conjunctivae and lids unremarkable. Cardiovascular: Regular rhythm.  Lungs: [name] work of breathing. Neurologic: No focal deficits.   Lab Results  Component Value Date   CREATININE 1.06 12/27/2019   BUN 20 12/27/2019   NA 141 12/27/2019   K 3.9 12/27/2019   CL 102 12/27/2019   CO2 21 12/27/2019   Lab Results  Component Value Date   ALT 37 12/27/2019   AST 25 12/27/2019   ALKPHOS 79 12/27/2019   BILITOT 0.4 12/27/2019   Lab Results  Component Value Date   HGBA1C 10.1 (H) 12/15/2019   HGBA1C 7.0 (H) 06/28/2019   HGBA1C 7.1 (H) 06/22/2019   HGBA1C 6.3 (H) 06/22/2018   HGBA1C 6.2 (H) 01/28/2018   Lab Results  Component Value Date   INSULIN 27.6 (H) 12/27/2019   INSULIN CANCELED 12/15/2019   INSULIN 17.6 06/22/2019   INSULIN 11.6  05/13/2018   INSULIN 10.5 01/28/2018   Lab Results  Component Value Date   TSH 0.894 12/27/2019   Lab Results  Component Value Date   CHOL 139 12/27/2019   HDL 46 12/27/2019   LDLCALC 73 12/27/2019   TRIG 110 12/27/2019   CHOLHDL 2.8 06/28/2019   Lab Results  Component Value Date   WBC 6.5 06/28/2019   HGB 14.9 06/28/2019   HCT 44.5 06/28/2019   MCV 83.5 06/28/2019   PLT 293 06/28/2019   No results found for: IRON, TIBC, FERRITIN  Attestation Statements:   Reviewed by clinician on day of visit: allergies, medications, problem list, medical history, surgical history, family history, social history, and previous encounter notes.  Time spent on visit including pre-visit chart review and post-visit care and charting was 30 minutes.    Wilhemena Durie, am acting as transcriptionist for [employer], PA-C.  I have reviewed the above documentation for accuracy and completeness, and I agree with the above. Abby Potash, PA-C

## 2020-06-06 ENCOUNTER — Other Ambulatory Visit: Payer: Self-pay

## 2020-06-06 ENCOUNTER — Ambulatory Visit (INDEPENDENT_AMBULATORY_CARE_PROVIDER_SITE_OTHER): Payer: 59 | Admitting: Dermatology

## 2020-06-06 DIAGNOSIS — L720 Epidermal cyst: Secondary | ICD-10-CM

## 2020-06-06 NOTE — Patient Instructions (Signed)

## 2020-06-06 NOTE — Progress Notes (Signed)
   Follow-Up Visit   Subjective  [name] is a 44 y.o. male who presents for the following: Cyst (vs other of right mastoid - [facility] today).  The following portions of the chart were reviewed this encounter and updated as appropriate:  Tobacco  Allergies  Meds  Problems  Med Hx  Surg Hx  Fam Hx     Review of Systems:  No other skin or systemic complaints except as noted in HPI or Assessment and Plan.  Objective  Well appearing patient in no apparent distress; mood and affect are within normal limits.  A focused examination was performed including scalp. Relevant physical exam findings are noted in the Assessment and Plan.  Objective  Right mastoid: 2.3 cm Subcutaneous nodule.    Assessment & Plan  Epidermal inclusion cyst Right mastoid  Mupirocin ointment qd with dressing changes - patient has  Skin excision - Right mastoid  Lesion length (cm):  2.3 Lesion width (cm):  2.3 Margin per side (cm):  0.1 Total excision diameter (cm):  2.5 Informed consent: discussed and consent obtained   Timeout: patient name, date of birth, surgical site, and procedure verified   Procedure prep:  Patient was prepped and draped in usual sterile fashion Prep type:  Isopropyl alcohol and povidone-iodine Anesthesia: the lesion was anesthetized in a standard fashion   Anesthetic:  1% lidocaine w/ epinephrine 1-100,000 buffered w/ 8.4% NaHCO3 Instrument used: #15 blade   Hemostasis achieved with: pressure   Hemostasis achieved with comment:  Electrocautery Outcome: patient tolerated procedure well with no complications   Post-procedure details: sterile dressing applied and wound care instructions given   Dressing type: bandage and pressure dressing (mupirocin)    Skin repair - Right mastoid Complexity:  Complex Final length (cm):  2.1 Reason for type of repair: reduce tension to allow closure, reduce the risk of dehiscence, infection, and necrosis, reduce subcutaneous dead space and  avoid a hematoma, allow closure of the large defect, preserve normal anatomy, preserve normal anatomical and functional relationships and enhance both functionality and cosmetic results   Undermining: area extensively undermined   Undermining comment:  Undermining defect  Subcutaneous layers (deep stitches):  Suture size:  4-0 Suture type: Vicryl (polyglactin 910)   Subcutaneous suture technique: inverted dermal. Fine/surface layer approximation (top stitches):  Suture size:  3-0 Suture type: nylon   Stitches: simple running   Suture removal (days):  7 Hemostasis achieved with: suture and pressure Outcome: patient tolerated procedure well with no complications   Post-procedure details: sterile dressing applied and wound care instructions given   Dressing type: bandage and pressure dressing (mupirocin)    Specimen 1 - Surgical pathology Differential Diagnosis: Cyst vs other  Check Margins: No 2.3 cm Subcutaneous nodule.  Return in about 1 week (around 06/13/2020) for suture removal.  I, Ashok Cordia, CMA, am acting as scribe for Sarina Ser, MD .  Documentation: I have reviewed the above documentation for accuracy and completeness, and I agree with the above.  Sarina Ser, MD

## 2020-06-07 ENCOUNTER — Encounter: Payer: Self-pay | Admitting: Dermatology

## 2020-06-13 ENCOUNTER — Ambulatory Visit (INDEPENDENT_AMBULATORY_CARE_PROVIDER_SITE_OTHER): Payer: 59 | Admitting: Dermatology

## 2020-06-13 ENCOUNTER — Other Ambulatory Visit: Payer: Self-pay

## 2020-06-13 ENCOUNTER — Encounter: Payer: Self-pay | Admitting: Dermatology

## 2020-06-13 DIAGNOSIS — Z4802 Encounter for removal of sutures: Secondary | ICD-10-CM

## 2020-06-13 DIAGNOSIS — L720 Epidermal cyst: Secondary | ICD-10-CM

## 2020-06-13 NOTE — Patient Instructions (Signed)
Wound Care Instructions  1. Cleanse wound gently with soap and water once a day then pat dry with clean gauze. Apply a thing coat of Mupirocin (Bactroban) over the wound (unless you have an allergy to this). We recommend that you use a new, sterile tube of Mupirocin. Do not pick or remove scabs. Do not remove the yellow or white "healing tissue" from the base of the wound.  2. Cover the wound with fresh, clean, nonstick gauze and secure with paper tape. You may use Band-Aids in place of gauze and tape if the would is small enough, but would recommend trimming much of the tape off as there is often too much. Sometimes Band-Aids can irritate the skin.  3. You should call the office for your biopsy report after 1 week if you have not already been contacted.  4. If you experience any problems, such as abnormal amounts of bleeding, swelling, significant bruising, significant pain, or evidence of infection, please call the office immediately.  5. FOR ADULT SURGERY PATIENTS: If you need something for pain relief you may take 1 extra strength Tylenol (acetaminophen) AND 2 Ibuprofen (200mg each) together every 4 hours as needed for pain. (do not take these if you are allergic to them or if you have a reason you should not take them.) Typically, you may only need pain medication for 1 to 3 days.   

## 2020-06-13 NOTE — Progress Notes (Signed)
   Follow-Up Visit   Subjective  [name] is a 44 y.o. male who presents for the following: Suture / Staple Removal (1 wk suture removal, R mastoid, path pending).  The following portions of the chart were reviewed this encounter and updated as appropriate: Tobacco  Allergies  Meds  Problems  Med Hx  Surg Hx  Fam Hx     Review of Systems: No other skin or systemic complaints except as noted in HPI or Assessment and Plan.  Objective  Well appearing patient in no apparent distress; mood and affect are within normal limits.  A focused examination was performed including right mastoid. Relevant physical exam findings are noted in the Assessment and Plan.  Objective  Right mastoid: Suture removal for excision of a 2.3 cm Subcutaneous nodule. Pathology report pending.   Assessment & Plan  Epidermal inclusion cyst - path pending Right mastoid  Encounter for Removal of Sutures - Incision site at the right mastoid is clean, dry and intact - Wound cleansed, sutures removed, wound cleansed and Mupirocin applied.  - Discussed pathology results showing pathology report pending.  - Scars remodel for a full year. - Patient advised to call with any concerns or if they notice any new or changing lesions.  Will call with pathology results.  Pt will schedule appt for mole check.   IHarriett Sine, CMA, am acting as scribe for Sarina Ser, MD.  Documentation: I have reviewed the above documentation for accuracy and completeness, and I agree with the above.  Sarina Ser, MD

## 2020-06-14 ENCOUNTER — Encounter (INDEPENDENT_AMBULATORY_CARE_PROVIDER_SITE_OTHER): Payer: Self-pay

## 2020-06-15 ENCOUNTER — Telehealth: Payer: Self-pay

## 2020-06-15 NOTE — Telephone Encounter (Signed)
-----   Message from Ralene Bathe, MD sent at 06/13/2020  6:09 PM EDT ----- Skin (M), right mastoid EPIDERMAL INCLUSION CYST  Benign cyst

## 2020-06-15 NOTE — Telephone Encounter (Signed)
Patient informed of pathology results 

## 2020-06-19 ENCOUNTER — Ambulatory Visit (INDEPENDENT_AMBULATORY_CARE_PROVIDER_SITE_OTHER): Payer: 59 | Admitting: Physician Assistant

## 2020-06-22 ENCOUNTER — Encounter (INDEPENDENT_AMBULATORY_CARE_PROVIDER_SITE_OTHER): Payer: Self-pay

## 2020-06-26 ENCOUNTER — Ambulatory Visit (INDEPENDENT_AMBULATORY_CARE_PROVIDER_SITE_OTHER): Payer: 59 | Admitting: Physician Assistant

## 2020-06-27 ENCOUNTER — Ambulatory Visit (INDEPENDENT_AMBULATORY_CARE_PROVIDER_SITE_OTHER): Payer: 59 | Admitting: Physician Assistant

## 2020-06-27 ENCOUNTER — Encounter (INDEPENDENT_AMBULATORY_CARE_PROVIDER_SITE_OTHER): Payer: Self-pay

## 2020-07-03 ENCOUNTER — Other Ambulatory Visit: Payer: Self-pay | Admitting: Family Medicine

## 2020-07-03 DIAGNOSIS — K219 Gastro-esophageal reflux disease without esophagitis: Secondary | ICD-10-CM

## 2020-07-03 NOTE — Telephone Encounter (Signed)
Requested Prescriptions  Pending Prescriptions Disp Refills   esomeprazole (NEXIUM) 40 MG capsule [Pharmacy Med Name: ESOMEPRAZOLE MAG DR 40 MG C 40 Capsule] 90 capsule 1    Sig: TAKE 1 CAPSULE BY MOUTH ONCE DAILY     Gastroenterology: Proton Pump Inhibitors Passed - 07/03/2020  8:17 AM      Passed - Valid encounter within last 12 months    Recent Outpatient Visits          5 months ago Type 2 diabetes mellitus with other specified complication, without long-term current use of insulin [facility])   [place], DO   8 months ago Acute non-recurrent frontal sinusitis   [facility] Olin Hauser, DO   11 months ago Annual physical exam   [place], DO   1 year ago Suspected Covid-19 Virus Infection   [facility] Olin Hauser, DO   2 years ago Annual physical exam   [facility] Olin Hauser, DO      Future Appointments            In 1 week [place], Devonne Doughty, [facility], [place]   In 1 month Donato Heinz, MD [place], [place]   In 1 month Ralene Bathe, MD [place]   In 5 months Diamantina [place], Herbert Seta, [facility]

## 2020-07-05 ENCOUNTER — Other Ambulatory Visit: Payer: Self-pay | Admitting: *Deleted

## 2020-07-05 DIAGNOSIS — Z0000 Encounter for general adult medical examination without abnormal findings: Secondary | ICD-10-CM

## 2020-07-05 DIAGNOSIS — E669 Obesity, unspecified: Secondary | ICD-10-CM

## 2020-07-05 DIAGNOSIS — F338 Other recurrent depressive disorders: Secondary | ICD-10-CM

## 2020-07-05 DIAGNOSIS — I10 Essential (primary) hypertension: Secondary | ICD-10-CM

## 2020-07-05 DIAGNOSIS — Z125 Encounter for screening for malignant neoplasm of prostate: Secondary | ICD-10-CM

## 2020-07-05 DIAGNOSIS — F3342 Major depressive disorder, recurrent, in full remission: Secondary | ICD-10-CM

## 2020-07-05 DIAGNOSIS — E785 Hyperlipidemia, unspecified: Secondary | ICD-10-CM

## 2020-07-05 DIAGNOSIS — E1169 Type 2 diabetes mellitus with other specified complication: Secondary | ICD-10-CM

## 2020-07-06 ENCOUNTER — Other Ambulatory Visit: Payer: 59

## 2020-07-10 DIAGNOSIS — F3342 Major depressive disorder, recurrent, in full remission: Secondary | ICD-10-CM | POA: Diagnosis not present

## 2020-07-10 DIAGNOSIS — E785 Hyperlipidemia, unspecified: Secondary | ICD-10-CM | POA: Diagnosis not present

## 2020-07-10 DIAGNOSIS — F338 Other recurrent depressive disorders: Secondary | ICD-10-CM | POA: Diagnosis not present

## 2020-07-10 DIAGNOSIS — E119 Type 2 diabetes mellitus without complications: Secondary | ICD-10-CM | POA: Diagnosis not present

## 2020-07-10 DIAGNOSIS — E1169 Type 2 diabetes mellitus with other specified complication: Secondary | ICD-10-CM | POA: Diagnosis not present

## 2020-07-10 DIAGNOSIS — I10 Essential (primary) hypertension: Secondary | ICD-10-CM | POA: Diagnosis not present

## 2020-07-10 DIAGNOSIS — Z0000 Encounter for general adult medical examination without abnormal findings: Secondary | ICD-10-CM | POA: Diagnosis not present

## 2020-07-10 DIAGNOSIS — Z125 Encounter for screening for malignant neoplasm of prostate: Secondary | ICD-10-CM | POA: Diagnosis not present

## 2020-07-10 DIAGNOSIS — E669 Obesity, unspecified: Secondary | ICD-10-CM | POA: Diagnosis not present

## 2020-07-11 LAB — COMPLETE METABOLIC PANEL WITH GFR
AG Ratio: 2 (calc) (ref 1.0–2.5)
ALT: 16 U/L (ref 9–46)
AST: 18 U/L (ref 10–40)
Albumin: 4.6 g/dL (ref 3.6–5.1)
Alkaline phosphatase (APISO): 60 U/L (ref 36–130)
BUN: 22 mg/dL (ref 7–25)
CO2: 24 mmol/L (ref 20–32)
Calcium: 9.6 mg/dL (ref 8.6–10.3)
Chloride: 104 mmol/L (ref 98–110)
Creat: 1.11 mg/dL (ref 0.60–1.35)
GFR, Est African American: 92 mL/min/{1.73_m2} (ref >=60)
GFR, Est Non African American: 79 mL/min/{1.73_m2} (ref >=60)
Globulin: 2.3 g/dL (calc) (ref 1.9–3.7)
Glucose, Bld: 96 mg/dL (ref 65–99)
Potassium: 3.8 mmol/L (ref 3.5–5.3)
Sodium: 139 mmol/L (ref 135–146)
Total Bilirubin: 0.6 mg/dL (ref 0.2–1.2)
Total Protein: 6.9 g/dL (ref 6.1–8.1)

## 2020-07-11 LAB — CBC WITH DIFFERENTIAL/PLATELET
Absolute Monocytes: 502 cells/uL (ref 200–950)
Basophils Absolute: 47 cells/uL (ref 0–200)
Basophils Relative: 0.8 %
Eosinophils Absolute: 171 cells/uL (ref 15–500)
Eosinophils Relative: 2.9 %
HCT: 44.9 % (ref 38.5–50.0)
Hemoglobin: 15.4 g/dL (ref 13.2–17.1)
Lymphs Abs: 1398 cells/uL (ref 850–3900)
MCH: 29.8 pg (ref 27.0–33.0)
MCHC: 34.3 g/dL (ref 32.0–36.0)
MCV: 86.8 fL (ref 80.0–100.0)
MPV: 10.6 fL (ref 7.5–12.5)
Monocytes Relative: 8.5 %
Neutro Abs: 3782 cells/uL (ref 1500–7800)
Neutrophils Relative %: 64.1 %
Platelets: 301 10*3/uL (ref 140–400)
RBC: 5.17 10*6/uL (ref 4.20–5.80)
RDW: 13.1 % (ref 11.0–15.0)
Total Lymphocyte: 23.7 %
WBC: 5.9 10*3/uL (ref 3.8–10.8)

## 2020-07-11 LAB — LIPID PANEL
Cholesterol: 112 mg/dL (ref <200)
HDL: 56 mg/dL (ref >=40)
LDL Cholesterol (Calc): 41 mg/dL (calc)
Non-HDL Cholesterol (Calc): 56 mg/dL (calc) (ref <130)
Total CHOL/HDL Ratio: 2 (calc) (ref <5.0)
Triglycerides: 71 mg/dL (ref <150)

## 2020-07-11 LAB — HEMOGLOBIN A1C
Hgb A1c MFr Bld: 5.9 % of total Hgb — ABNORMAL HIGH (ref <5.7)
Mean Plasma Glucose: 123 (calc)
eAG (mmol/L): 6.8 (calc)

## 2020-07-11 LAB — PSA: PSA: 0.67 ng/mL (ref <=4.0)

## 2020-07-13 ENCOUNTER — Ambulatory Visit (INDEPENDENT_AMBULATORY_CARE_PROVIDER_SITE_OTHER): Payer: 59 | Admitting: Family Medicine

## 2020-07-13 ENCOUNTER — Encounter: Payer: Self-pay | Admitting: Family Medicine

## 2020-07-13 ENCOUNTER — Other Ambulatory Visit: Payer: Self-pay

## 2020-07-13 VITALS — BP 122/81 | HR 72 | Temp 97.5°F | Resp 16 | Ht 73.0 in | Wt 230.6 lb

## 2020-07-13 DIAGNOSIS — E1169 Type 2 diabetes mellitus with other specified complication: Secondary | ICD-10-CM

## 2020-07-13 DIAGNOSIS — R400 Somnolence: Secondary | ICD-10-CM

## 2020-07-13 DIAGNOSIS — F338 Other recurrent depressive disorders: Secondary | ICD-10-CM | POA: Diagnosis not present

## 2020-07-13 DIAGNOSIS — F3342 Major depressive disorder, recurrent, in full remission: Secondary | ICD-10-CM

## 2020-07-13 DIAGNOSIS — Z0000 Encounter for general adult medical examination without abnormal findings: Secondary | ICD-10-CM | POA: Diagnosis not present

## 2020-07-13 DIAGNOSIS — E669 Obesity, unspecified: Secondary | ICD-10-CM

## 2020-07-13 DIAGNOSIS — E1165 Type 2 diabetes mellitus with hyperglycemia: Secondary | ICD-10-CM

## 2020-07-13 DIAGNOSIS — Z23 Encounter for immunization: Secondary | ICD-10-CM

## 2020-07-13 DIAGNOSIS — E785 Hyperlipidemia, unspecified: Secondary | ICD-10-CM

## 2020-07-13 DIAGNOSIS — I10 Essential (primary) hypertension: Secondary | ICD-10-CM

## 2020-07-13 NOTE — Assessment & Plan Note (Addendum)
Controlled cholesterol on statin +  lifestyle Last lipid panel 06/2020 Calculated ASCVD 10 yr risk score < 5% low risk despite DM  Plan: 1. Continue Atorvastatin 20mg  2. Encourage improved lifestyle - low carb/cholesterol, reduce portion size, continue improving regular exercise 3. Follow-up q 1 yr lipids

## 2020-07-13 NOTE — Patient Instructions (Addendum)
Thank you for coming to the office today.  COVID Vaccine Information  You can get booster dose at [facility], [facility] - check their websites or call to schedule in advance. Appointment is required.  [place] Vaccine Information  [email or web address]  Appointments are required. To register for your free vaccination appointment, click the link on the date on the calendar located on the website or call [phone].  NOTICE ON BOOSTERS Chief Operating Officer Now Available to Eligible Populations [place] is now offering at all [employer] vaccination clinics a Pfizer booster dose to the following eligible populations, six months after receiving the second dose of the Pfizer vaccine:  People ages 64 and older and residents in long-term care settings should receive it. People ages 52 to 14 with underlying medical conditions should receive it. People ages 53 to 24 with underlying medical conditions may receive it, based on individual benefits and risks People ages 5 to 4 who are at increased risk of occupational exposure and transmission, such as health care workers, may receive it, based on individual benefits and risks Appointments are required. To register, click the link on the date in the calendar above or call [phone], Monday-Friday, 7 a.m.-7 p.m.    Consider Melatonin supplement more regularly, 1 to 5 mg at low to moderate dose, high dose is max 10mg   Sleep Hygiene Recommendations to promote healthy sleep in all patients, especially if symptoms of insomnia are worsening. Due to the nature of sleep rhythms, if your body gets "out of rhythm", it may take some time before your sleep cycle can be "reset".  Please try to follow as many of the following tips as you can, usually there are only a few of these are the primary cause of the problem.  ?To reset your sleep rhythm, go to bed and get up at the same time every  day ?Sleep only long enough to feel rested and then get out of bed ?Do not try to force yourself to sleep. If you can't sleep, get out of bed and try again later. ?Avoid naps during the day, unless excessively tired. The more sleeping during the day, then the less sleep your body needs at night.  ?Have coffee, tea, and other foods that have caffeine only in the morning ?Exercise several days a week, but not right before bed ?If you drink alcohol, prefer to have appropriate drink with one meal, but prefer to avoid alcohol in the evening, and bedtime ?If you smoke, avoid smoking, especially in the evening  ?Avoid watching TV or looking at phones, computers, or reading devices ("e-books") that give off light at least 30 minutes before bed. This artificial light sends "awake signals" to your brain and can make it harder to fall asleep. ?Make your bedroom a comfortable place where it is easy to fall asleep: ? Put up shades or special blackout curtains to block light from outside. ? Use a white noise machine to block noise. ? Keep the temperature cool. ?Try your best to solve or at least address your problems before you go to bed ?Use relaxation techniques to manage stress. Ask your health care provider to suggest some techniques that may work well for you. These may include: ? Breathing exercises. ? Routines to release muscle tension. ? Visualizing peaceful scenes.     Please schedule a Follow-up Appointment to: Return in about 6 months (around 01/11/2021) for 6 month follow-up DM A1c, HTN, Mood PHQ, Tiredness/Sleepy.  If you have any other questions  or concerns, please feel free to call the office or send a message through [place]. You may also schedule an earlier appointment if necessary.  Additionally, you may be receiving a survey about your experience at our office within a few days to 1 week by e-mail or mail. We value your feedback.  [name], DO [place]

## 2020-07-13 NOTE — Progress Notes (Signed)
Subjective:    Patient ID: [name], male    DOB: 11-21-1975, 44 y.o.   MRN: [id number]  [name] is a 44 y.o. male presenting on 07/13/2020 for Annual Exam   HPI  Here for Annual Physical and Lab Review.  CHRONIC DM, Type 2:Obesity BMI >33 Followed by [facility] Endocrinology Dr Gabriel Carina who is managing his diabetes, every 6 month. Recent A1c has dramatically improved from 10 down to 5.9 Freestyle Libre (no stick CBG) CBG avg fasting 100, overall avg < 150, high >200 Meds:Metformin 2000mg XR daily at bedtime, Farxiga 10mg  daily, Ozempic 0.5mg  weekly - OFF Glipizide XL 10mg  daily Reports good compliance. Tolerating well w/o side-effects Currently on ARB Lifestyle: Weight down 25 lbs in 5-6 months. - Major improved stress at this point big part of his success in lowering A1c - Diet (Continues onimproved DM diet) - Exercise (increasing exercise) WIll see Dr Katy Fitch in 01/27/2020 and update eye exam Admits hypoglycemia 63 (Friday AM, felt symptomatic) Denies hypoglycemia  Hemorrhoids, External Recent increased driving, had mild hemorrhoid flare, request re order anusol suppository. Recent flare.  CHRONIC HTN: No new concerns. Admits some stress contributing. Home readings are improved 120-140 range. Current Meds - Valsartan-HCTZ 320-25mg  dailyin AM(previously increased) Reports good compliance, took meds today. Tolerating well, w/o complaints.  History of Depression, in remission/PossibleSeasonal Affective Disorder - Reports chronic problem has episodic flaresusually related to life stressors and winter months - Followed by [facility]- on Duloxetine 60mg  daily, Trintellix 20mg  daily. - Reports he is doing well overall, currently depression is controlled, he feels like it is in remission overall   Additional concerns  Fatigue / Tiredness Admits some poor sleep.  Health Maintenance: Due for Flu Shot, will receive today     Depression screen [facility]  2/9 07/13/2020 01/10/2020 04/27/2019  Decreased Interest 1 0 0  Down, Depressed, Hopeless 0 0 0  PHQ - 2 Score 1 0 0  Altered sleeping 0 0 0  Tired, decreased energy 2 0 0  Change in appetite 0 0 0  Feeling bad or failure about yourself  0 0 0  Trouble concentrating 0 0 0  Moving slowly or fidgety/restless 0 0 0  Suicidal thoughts 0 0 0  PHQ-9 Score 3 0 0  Difficult doing work/chores Not difficult at all Not difficult at all Not difficult at all  Some recent data might be hidden   GAD 7 : Generalized Anxiety Score 07/13/2020 04/27/2019 11/17/2018 10/27/2018  Nervous, Anxious, on Edge 0 0 0 1  Control/stop worrying 0 0 0 0  Worry too much - different things 0 0 1 0  Trouble relaxing 0 0 0 1  Restless 0 0 0 0  Easily annoyed or irritable 0 0 1 0  Afraid - awful might happen 0 0 0 0  Total GAD 7 Score 0 0 2 2  Anxiety Difficulty Not difficult at all Not difficult at all Not difficult at all Not difficult at all     Past Medical History:  Diagnosis Date  . Chest pain   . Dysplastic nevus 05/09/2020 excised   left low back lat above waistline/flank, severe  . Family history of adverse reaction to anesthesia    MOM-N/V, DAD-HAS PROBLEMS WITH NOVICAINE  . Food allergy   . GERD (gastroesophageal reflux disease)   . History of hiatal hernia   . History of kidney stones    H/O  . Insomnia   . Joint pain   . Plantar fasciitis   .  Seasonal allergies   . Tendonitis of foot    left   Past Surgical History:  Procedure Laterality Date  . CARPAL TUNNEL RELEASE Right 07/03/2017   Procedure: CARPAL TUNNEL RELEASE;  Surgeon: Hessie Knows, MD;  Location: [facility] ORS;  Service: Orthopedics;  Laterality: Right;  . clavide surgery  2005  . CYSTOSCOPY/URETEROSCOPY/HOLMIUM LASER/STENT PLACEMENT Right 05/22/2018   Procedure: CYSTOSCOPY/URETEROSCOPY/HOLMIUM LASER/STENT PLACEMENT;  Surgeon: Billey Co, MD;  Location: [facility] ORS;  Service: Urology;  Laterality: Right;  . KNEE SURGERY Right 2013  .  SHOULDER SURGERY Right 2008   Social History   Socioeconomic History  . Marital status: Married    Spouse name: [name]  . Number of children: 1  . Years of education: Not on file  . Highest education level: Not on file  Occupational History  . 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 Smoking status: Never Smoker  . Smokeless tobacco: Never Used  Vaping Use  . Vaping Use: Never used  Substance and Sexual Activity  . Alcohol use: Yes    Alcohol/week: 0.0 standard drinks    Comment: RARE  . Drug use: No  . Sexual activity: Yes  Other Topics Concern  . Not on file  Social History Narrative  . Not on file   Social Determinants of Health   Financial Resource Strain:   . Difficulty of Paying Living Expenses: Not on file  Food Insecurity:   . Worried About Charity fundraiser in the Last Year: Not on file  . Ran Out of Food in the Last Year: Not on file  Transportation Needs:   . Lack of Transportation (Medical): Not on file  . Lack of Transportation (Non-Medical): Not on file  Physical Activity:   . Days of Exercise per Week: Not on file  . Minutes of Exercise per Session: Not on file  Stress:   . Feeling of Stress : Not on file  Social Connections:   . Frequency of Communication with Friends and Family: Not on file  . Frequency of Social Gatherings with Friends and Family: Not on file  . Attends Religious Services: Not on file  . Active Member of Clubs or Organizations: Not on file  . Attends Archivist Meetings: Not on file  . Marital Status: Not on file  Intimate Partner Violence:   . Fear of Current or Ex-Partner: Not on file  . Emotionally Abused: Not on file  . Physically Abused: Not on file  . Sexually Abused: Not on file   Family History  Problem Relation Age of Onset  . Cancer Mother        colon/rectal  . Hyperlipidemia Mother   . Cancer Brother        h[place]kins  . Cancer  Maternal Grandfather        lung  . Stroke Maternal Grandfather   . Diabetes Father   . Hypertension Father   . Hyperlipidemia Father   . Heart disease Father 53  . Alcoholism Father    Current Outpatient Medications on File Prior to Visit  Medication Sig  . DULoxetine (CYMBALTA) 60 MG capsule Take 60 mg by mouth daily.   [name] esomeprazole (NEXIUM) 40 MG capsule TAKE 1 CAPSULE BY MOUTH ONCE DAILY  . etodolac (LODINE) 500 MG tablet TAKE 1 TABLET BY MOUTH TWICE DAILY AS NEEDED (MODERATE PAIN).  [name] FARXIGA 10 MG TABS tablet Take 10 mg  by mouth daily.  . hydrocortisone (ANUSOL-HC) 25 MG suppository Place 1 suppository (25 mg total) rectally 2 (two) times daily as needed for hemorrhoids or anal itching. For 7 days  . ipratropium (ATROVENT) 0.06 % nasal spray Place 2 sprays into both nostrils 3 (three) times daily as needed for rhinitis.  . metFORMIN (GLUCOPHAGE XR) 500 MG 24 hr tablet Take 4 tablets (2,000 mg total) by mouth daily with breakfast.  . Multiple Vitamin (MULTIVITAMIN) capsule Take 1 capsule by mouth daily.  . mupirocin ointment (BACTROBAN) 2 % Apply 1 application topically daily.  . nitroGLYCERIN (NITROSTAT) 0.4 MG SL tablet Place under the tongue.  . Semaglutide, 1 MG/DOSE, (OZEMPIC, 1 MG/DOSE,) 2 MG/1.5ML SOPN Inject 0.25 mg into the skin once a week.  . TRINTELLIX 20 MG TABS tablet Take 20 mg by mouth daily.  . valsartan-hydrochlorothiazide (DIOVAN-HCT) 320-25 MG tablet TAKE 1 TABLET BY MOUTH DAILY.  [name] Vitamin D, Ergocalciferol, (DRISDOL) 1.25 MG (50000 UNIT) CAPS capsule Take 1 capsule (50,000 Units total) by mouth 2 (two) times a week.  [name] atorvastatin (LIPITOR) 20 MG tablet Take 1 tablet (20 mg total) by mouth daily at 6 PM. Over due for appt  Needs follow up for future refills./cy   No current facility-administered medications on file prior to visit.    Review of Systems  Constitutional: Negative for activity change, appetite change, chills, diaphoresis, fatigue and fever.    HENT: Negative for congestion and hearing loss.   Eyes: Negative for visual disturbance.  Respiratory: Negative for apnea, cough, chest tightness, shortness of breath and wheezing.   Cardiovascular: Negative for chest pain, palpitations and leg swelling.  Gastrointestinal: Negative for abdominal pain, constipation, diarrhea, nausea and vomiting.  Endocrine: Negative for cold intolerance.  Genitourinary: Negative for difficulty urinating, dysuria, frequency and hematuria.  Musculoskeletal: Negative for arthralgias, back pain and neck pain.  Skin: Negative for rash.  Allergic/Immunologic: Negative for environmental allergies.  Neurological: Negative for dizziness, weakness, light-headedness, numbness and headaches.  Hematological: Negative for adenopathy.  Psychiatric/Behavioral: Negative for behavioral problems, dysphoric mood and sleep disturbance. The patient is not nervous/anxious.    Per HPI unless specifically indicated above      Objective:    BP 122/81   Pulse 72   Temp (!) 97.5 F (36.4 C) (Temporal)   Resp 16   Ht 6\' 1"  (1.854 m)   Wt 230 lb 9.6 oz (104.6 kg)   SpO2 100%   BMI 30.42 kg/m   Wt Readings from Last 3 Encounters:  07/13/20 230 lb 9.6 oz (104.6 kg)  05/29/20 225 lb (102.1 kg)  05/08/20 230 lb (104.3 kg)    Physical Exam Vitals and nursing note reviewed.  Constitutional:      General: He is not in acute distress.    Appearance: He is well-developed. He is not diaphoretic.     Comments: Well-appearing, comfortable, cooperative  HENT:     Head: Normocephalic and atraumatic.  Eyes:     General:        Right eye: No discharge.        Left eye: No discharge.     Conjunctiva/sclera: Conjunctivae normal.     Pupils: Pupils are equal, round, and reactive to light.  Neck:     Thyroid: No thyromegaly.  Cardiovascular:     Rate and Rhythm: Normal rate and regular rhythm.     Heart sounds: Normal heart sounds. No murmur heard.   Pulmonary:     Effort:  Pulmonary effort is  normal. No respiratory distress.     Breath sounds: Normal breath sounds. No wheezing or rales.  Abdominal:     General: Bowel sounds are normal. There is no distension.     Palpations: Abdomen is soft. There is no mass.     Tenderness: There is no abdominal tenderness.  Musculoskeletal:        General: No tenderness. Normal range of motion.     Cervical back: Normal range of motion and neck supple.     Comments: Upper / Lower Extremities: - Normal muscle tone, strength bilateral upper extremities 5/5, lower extremities 5/5  Lymphadenopathy:     Cervical: No cervical adenopathy.  Skin:    General: Skin is warm and dry.     Findings: No erythema or rash.  Neurological:     Mental Status: He is alert and oriented to person, place, and time.     Comments: Distal sensation intact to light touch all extremities  Psychiatric:        Behavior: Behavior normal.     Comments: Well groomed, good eye contact, normal speech and thoughts        Results for orders placed or performed in visit on 07/05/20  PSA  Result Value Ref Range   PSA 0.67 < OR = 4.0 ng/mL  Lipid panel  Result Value Ref Range   Cholesterol 112 <200 mg/dL   HDL 56 > OR = 40 mg/dL   Triglycerides 71 <150 mg/dL   LDL Cholesterol (Calc) 41 mg/dL (calc)   Total CHOL/HDL Ratio 2.0 <5.0 (calc)   Non-HDL Cholesterol (Calc) 56 <130 mg/dL (calc)  COMPLETE METABOLIC PANEL WITH GFR  Result Value Ref Range   Glucose, Bld 96 65 - 99 mg/dL   BUN 22 7 - 25 mg/dL   Creat 1.11 0.60 - 1.35 mg/dL   GFR, Est Non African American 79 > OR = 60 mL/min/1.48m2   GFR, Est African American 92 > OR = 60 mL/min/1.52m2   BUN/Creatinine Ratio NOT APPLICABLE 6 - 22 (calc)   Sodium 139 135 - 146 mmol/L   Potassium 3.8 3.5 - 5.3 mmol/L   Chloride 104 98 - 110 mmol/L   CO2 24 20 - 32 mmol/L   Calcium 9.6 8.6 - 10.3 mg/dL   Total Protein 6.9 6.1 - 8.1 g/dL   Albumin 4.6 3.6 - 5.1 g/dL   Globulin 2.3 1.9 - 3.7 g/dL (calc)     AG Ratio 2.0 1.0 - 2.5 (calc)   Total Bilirubin 0.6 0.2 - 1.2 mg/dL   Alkaline phosphatase (APISO) 60 36 - 130 U/L   AST 18 10 - 40 U/L   ALT 16 9 - 46 U/L  CBC with Differential/Platelet  Result Value Ref Range   WBC 5.9 3.8 - 10.8 Thousand/uL   RBC 5.17 4.20 - 5.80 Million/uL   Hemoglobin 15.4 13.2 - 17.1 g/dL   HCT 44.9 38 - 50 %   MCV 86.8 80.0 - 100.0 fL   MCH 29.8 27.0 - 33.0 pg   MCHC 34.3 32.0 - 36.0 g/dL   RDW 13.1 11.0 - 15.0 %   Platelets 301 140 - 400 Thousand/uL   MPV 10.6 7.5 - 12.5 fL   Neutro Abs 3,782 1,500 - 7,800 cells/uL   Lymphs Abs 1,398 850 - 3,900 cells/uL   Absolute Monocytes 502 200 - 950 cells/uL   Eosinophils Absolute 171 15.0 - 500.0 cells/uL   Basophils Absolute 47 0.0 - 200.0 cells/uL   Neutrophils Relative %  64.1 %   Total Lymphocyte 23.7 %   Monocytes Relative 8.5 %   Eosinophils Relative 2.9 %   Basophils Relative 0.8 %  Hemoglobin A1c  Result Value Ref Range   Hgb A1c MFr Bld 5.9 (H) <5.7 % of total Hgb   Mean Plasma Glucose 123 (calc)   eAG (mmol/L) 6.8 (calc)      Assessment & Plan:   Problem List Items Addressed This Visit    Type 2 diabetes mellitus with hyperglycemia, without long-term current use of insulin (HCC)    Previously controlled A1c, last reading up to 10.1 (11/3019) - now CBGs improved Complicated with HLD, HTN, GERD Followed by [facility] Endocrinology Dr Gabriel Carina  Plan:  1. Continue current therapy - Metformin XR 2000mg  daily, Farxiga 10mg  daily, Ozempic 0.5mg  weekly 2. Encourage improved lifestyle - low carb, low sugar diet, reduce portion size, continue improving regular exercise 3. Continue f/u Endocrinology as scheduled      Relevant Medications   FARXIGA 10 MG TABS tablet   Seasonal affective disorder ([place])   Hyperlipidemia associated with type 2 diabetes mellitus ([place])    Controlled cholesterol on statin +  lifestyle Last lipid panel 06/2020 Calculated ASCVD 10 yr risk score < 5% low risk despite DM  Plan: 1.  Continue Atorvastatin 20mg  2. Encourage improved lifestyle - low carb/cholesterol, reduce portion size, continue improving regular exercise 3. Follow-up q 1 yr lipids      Relevant Medications   FARXIGA 10 MG TABS tablet   Essential hypertension    Controlled BP Home readings normal No known complications    Plan:  1. Continue current BP regimen - Valsartan-HCTZ 320-25mg  daily 2. Encourage improved lifestyle - low sodium diet, regular exercise 3. May continue monitor BP outside office, bring readings to next visit, if persistently >140/90 or new symptoms notify office sooner      Relevant Medications   nitroGLYCERIN (NITROSTAT) 0.4 MG SL tablet   Depression, major, recurrent, in complete remission ([place])    Stable, controlled. Seems currently in remission. Suspected factor with Seasonal Affective Disorder Followed by Psychiatry Dr Toy Care in [place] Continue Duloxetine 60mg  + Trintellix 20mg  daily Off Wellbutrin, Fluoxetine       Other Visit Diagnoses    Annual physical exam    -  Primary   Obesity (BMI 30.0-34.9)       Type 2 diabetes mellitus with other specified complication, without long-term current use of insulin (HCC)       Relevant Medications   FARXIGA 10 MG TABS tablet   Needs flu shot       Relevant Orders   Flu Vaccine QUAD 36+ mos IM (Completed)   Daytime sleepiness          No orders of the defined types were placed in this encounter.  Updated Health Maintenance information - Flu Shot today. Reviewed recent lab results with patient Encouraged improvement to lifestyle with diet and exercise - Goal of weight loss  #Sleepiness / Tired DIscussion additionally today about this concern, reviewed possible mental health or stress factors and change of scenery with move and difficulty sleeping, may have other sleep concern suspect poor sleep can cause daytime sleepiness or tired at end of day. - No sign on lab or other medical history for other organic  cause Follow-up  Follow up plan: Return in about 6 months (around 01/11/2021) for 6 month follow-up DM A1c, HTN, Mood PHQ, Tiredness/Sleepy.    [name], [facility] 07/13/2020, 8:44 AM

## 2020-07-13 NOTE — Assessment & Plan Note (Signed)
Stable, controlled. Seems currently in remission. Suspected factor with Seasonal Affective Disorder Followed by Psychiatry Dr Toy Care in [facility] Continue Duloxetine 60mg  + Trintellix 20mg  daily Off Wellbutrin, Fluoxetine

## 2020-07-13 NOTE — Assessment & Plan Note (Signed)
Previously controlled A1c, last reading up to 10.1 (11/3019) - now CBGs improved Complicated with HLD, HTN, GERD Followed by [facility] Endocrinology Dr Gabriel Carina  Plan:  1. Continue current therapy - Metformin XR 2000mg  daily, Farxiga 10mg  daily, Ozempic 0.5mg  weekly 2. Encourage improved lifestyle - low carb, low sugar diet, reduce portion size, continue improving regular exercise 3. Continue f/u Endocrinology as scheduled

## 2020-07-13 NOTE — Assessment & Plan Note (Signed)
Controlled BP Home readings normal No known complications    Plan:  1. Continue current BP regimen - Valsartan-HCTZ 320-25mg  daily 2. Encourage improved lifestyle - low sodium diet, regular exercise 3. May continue monitor BP outside office, bring readings to next visit, if persistently >140/90 or new symptoms notify office sooner

## 2020-07-17 DIAGNOSIS — E119 Type 2 diabetes mellitus without complications: Secondary | ICD-10-CM | POA: Diagnosis not present

## 2020-08-07 ENCOUNTER — Other Ambulatory Visit: Payer: Self-pay | Admitting: Family Medicine

## 2020-08-07 ENCOUNTER — Ambulatory Visit: Payer: 59 | Attending: Internal Medicine

## 2020-08-07 DIAGNOSIS — Z23 Encounter for immunization: Secondary | ICD-10-CM

## 2020-08-07 DIAGNOSIS — M1990 Unspecified osteoarthritis, unspecified site: Secondary | ICD-10-CM

## 2020-08-07 NOTE — Progress Notes (Signed)
   Covid-19 Vaccination Clinic  Name:  [name]    MRN: [id number] DOB: 04-25-1976  08/07/2020  [name] was observed post Covid-19 immunization for 15 minutes without incident. He was provided with Vaccine Information Sheet and instruction to access the V-Safe system.   [name] was instructed to call 911 with any severe reactions post vaccine: [name] Difficulty breathing  . Swelling of face and throat  . A fast heartbeat  . A bad rash all over body  . Dizziness and weakness   Immunizations Administered    Name Date Dose VIS Date Route   Pfizer COVID-19 Vaccine 08/07/2020  3:24 PM 0.3 mL 07/05/2020 Intramuscular   Manufacturer: [place]   Lot: [id number]   [place]: [address]-5

## 2020-08-15 ENCOUNTER — Other Ambulatory Visit: Payer: Self-pay | Admitting: Internal Medicine

## 2020-08-22 NOTE — Progress Notes (Signed)
Cardiology Office Note:    Date:  08/23/2020   ID:  MOUA RASMUSSON, DOB 01/23/1976, MRN [id number]  PCP:  Olin Hauser, DO  Cardiologist:  No primary care provider on file.  Electrophysiologist:  None   Referring MD: [name] *   No chief complaint on file.   History of Present Illness:    [name] is a 44 y.o. male with a hx of type 2 diabetes, hypertension, nonobstructive CAD who presents for follow-up.   He saw Dr. Tamala Julian for chest pain around 2010 and had a exercise treadmill test that was negative.  He was initially seen on 07/28/2019 for exertional chest pain.  Coronary CTA was done on 08/17/2019, which showed nonobstructive CAD (noncalcified plaque in the proximal LAD causing 25 to 49% stenosis, CT FFR 0.9; calcium score 0).  TTE 08/09/2019 showed normal systolic function.    Since last clinic visit, he reports that he has been doing well.  He had an episode of chest pain when riding his mountain bike a couple months ago.  Other than that denies any chest pain.  States that he has been walking or using the elliptical for 30 minutes 3-4 times per week.  Denies any exertional chest pain or dyspnea.  Denies any lightheadedness, syncope, lower extremity edema, or palpitations.   Wt Readings from Last 3 Encounters:  08/23/20 227 lb (103 kg)  07/13/20 230 lb 9.6 oz (104.6 kg)  05/29/20 225 lb (102.1 kg)     Past Medical History:  Diagnosis Date  . Chest pain   . Dysplastic nevus 05/09/2020 excised   left low back lat above waistline/flank, severe  . Family history of adverse reaction to anesthesia    MOM-N/V, DAD-HAS PROBLEMS WITH NOVICAINE  . Food allergy   . GERD (gastroesophageal reflux disease)   . History of hiatal hernia   . History of kidney stones    H/O  . Insomnia   . Joint pain   . Plantar fasciitis   . Seasonal allergies   . Tendonitis of foot    left    Past Surgical History:  Procedure Laterality Date  . CARPAL TUNNEL  RELEASE Right 07/03/2017   Procedure: CARPAL TUNNEL RELEASE;  Surgeon: Hessie Knows, MD;  Location: [facility] ORS;  Service: Orthopedics;  Laterality: Right;  . clavide surgery  2005  . CYSTOSCOPY/URETEROSCOPY/HOLMIUM LASER/STENT PLACEMENT Right 05/22/2018   Procedure: CYSTOSCOPY/URETEROSCOPY/HOLMIUM LASER/STENT PLACEMENT;  Surgeon: Billey Co, MD;  Location: [facility] ORS;  Service: Urology;  Laterality: Right;  . KNEE SURGERY Right 2013  . SHOULDER SURGERY Right 2008    Current Medications: Current Meds  Medication Sig  . atorvastatin (LIPITOR) 20 MG tablet Take 1 tablet (20 mg total) by mouth daily at 6 PM. Over due for appt  Needs follow up for future refills./cy  . DULoxetine (CYMBALTA) 60 MG capsule Take 60 mg by mouth daily.   [name] esomeprazole (NEXIUM) 40 MG capsule TAKE 1 CAPSULE BY MOUTH ONCE DAILY  . etodolac (LODINE) 500 MG tablet TAKE 1 TABLET BY MOUTH TWICE DAILY AS NEEDED (MODERATE PAIN).  [name] FARXIGA 10 MG TABS tablet Take 10 mg by mouth daily.  . hydrocortisone (ANUSOL-HC) 25 MG suppository Place 1 suppository (25 mg total) rectally 2 (two) times daily as needed for hemorrhoids or anal itching. For 7 days  . ipratropium (ATROVENT) 0.06 % nasal spray Place 2 sprays into both nostrils 3 (three) times daily as needed for rhinitis.  . metFORMIN (GLUCOPHAGE XR) 500  MG 24 hr tablet Take 4 tablets (2,000 mg total) by mouth daily with breakfast.  . Multiple Vitamin (MULTIVITAMIN) capsule Take 1 capsule by mouth daily.  . mupirocin ointment (BACTROBAN) 2 % Apply 1 application topically daily.  . nitroGLYCERIN (NITROSTAT) 0.4 MG SL tablet Place under the tongue.  . Semaglutide, 1 MG/DOSE, (OZEMPIC, 1 MG/DOSE,) 2 MG/1.5ML SOPN Inject 0.25 mg into the skin once a week.  . TRINTELLIX 20 MG TABS tablet Take 20 mg by mouth daily.  . valsartan-hydrochlorothiazide (DIOVAN-HCT) 320-25 MG tablet TAKE 1 TABLET BY MOUTH DAILY.  [name] Vitamin D, Ergocalciferol, (DRISDOL) 1.25 MG (50000 UNIT) CAPS capsule Take  1 capsule (50,000 Units total) by mouth 2 (two) times a week.     Allergies:   Tomato   Social History   Socioeconomic History  . Marital status: Married    Spouse name: [name]  . Number of children: 1  . Years of education: Not on file  . Highest education level: Not on file  Occupational History  . 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 Smoking status: Never Smoker  . Smokeless tobacco: Never Used  Vaping Use  . Vaping Use: Never used  Substance and Sexual Activity  . Alcohol use: Yes    Alcohol/week: 0.0 standard drinks    Comment: RARE  . Drug use: No  . Sexual activity: Yes  Other Topics Concern  . Not on file  Social History Narrative  . Not on file   Social Determinants of Health   Financial Resource Strain:   . Difficulty of Paying Living Expenses: Not on file  Food Insecurity:   . Worried About Charity fundraiser in the Last Year: Not on file  . Ran Out of Food in the Last Year: Not on file  Transportation Needs:   . Lack of Transportation (Medical): Not on file  . Lack of Transportation (Non-Medical): Not on file  Physical Activity:   . Days of Exercise per Week: Not on file  . Minutes of Exercise per Session: Not on file  Stress:   . Feeling of Stress : Not on file  Social Connections:   . Frequency of Communication with Friends and Family: Not on file  . Frequency of Social Gatherings with Friends and Family: Not on file  . Attends Religious Services: Not on file  . Active Member of Clubs or Organizations: Not on file  . Attends Archivist Meetings: Not on file  . Marital Status: Not on file     Family History: The patient's family history includes Alcoholism in his father; Cancer in his brother, maternal grandfather, and mother; Diabetes in his father; Heart disease (age of onset: 39) in his father; Hyperlipidemia in his father and mother; Hypertension in his father;  Stroke in his maternal grandfather.  ROS:   Please see the history of present illness.    All other systems reviewed and are negative.  EKGs/Labs/Other Studies Reviewed:    The following studies were reviewed today:   EKG:  EKG is ordered today.  The ekg ordered today demonstrates normal sinus rhythm, rate 72, no ST/T abnormalities  Coronary CTA 08/17/19: 1. Coronary calcium score of 0. This was 0 percentile for age and sex matched control. 2.  Normal coronary origin with right dominance. 3. Nonobstructive CAD, with noncalcified plaque causing mild stenosis (25-49%) in the proximal LAD 4. There is a step artifact  in the RPLB that precludes interpretation of a portion of the vessel CAD-RADS 2. Mild non-obstructive CAD (25-49%). Consider non-atherosclerotic causes of chest pain. Consider preventive therapy and risk factor modification.  CTFFR 08/17/19: 1. CT-FFR across lesion in proximal LAD is 0.9, suggesting lesion is not functionally significant  TTE 08/09/19:  1. Left ventricular ejection fraction, by visual estimation, is 55 to 60%. The left ventricle has normal function. Left ventricular septal wall thickness was mildly increased. Mildly increased left ventricular posterior wall thickness. There is mildly  increased left ventricular hypertrophy.  2. Left ventricular diastolic parameters are consistent with Grade I diastolic dysfunction (impaired relaxation).  3. Global right ventricle has normal systolic function.The right ventricular size is normal. No increase in right ventricular wall thickness.  4. Left atrial size was normal.  5. Right atrial size was normal.  6. The mitral valve is normal in structure. No evidence of mitral valve regurgitation. No evidence of mitral stenosis.  7. The tricuspid valve is normal in structure. Tricuspid valve regurgitation is trivial.  8. The aortic valve is tricuspid. Aortic valve regurgitation is not visualized. No evidence of aortic  valve sclerosis or stenosis.  9. The pulmonic valve was normal in structure. Pulmonic valve regurgitation is not visualized. 10. Normal pulmonary artery systolic pressure. 11. The inferior vena cava is normal in size with greater than 50% respiratory variability, suggesting right atrial pressure of 3 mmHg.  Recent Labs: 12/27/2019: TSH 0.894 07/10/2020: ALT 16; BUN 22; Creat 1.11; Hemoglobin 15.4; Platelets 301; Potassium 3.8; Sodium 139  Recent Lipid Panel    Component Value Date/Time   CHOL 112 07/10/2020 0821   CHOL 139 12/27/2019 1008   TRIG 71 07/10/2020 0821   HDL 56 07/10/2020 0821   HDL 46 12/27/2019 1008   CHOLHDL 2.0 07/10/2020 0821   LDLCALC 41 07/10/2020 0821    Physical Exam:    VS:  BP 108/72   Pulse 72   Ht 6\' 1"  (1.854 m)   Wt 227 lb (103 kg)   BMI 29.95 kg/m     Wt Readings from Last 3 Encounters:  08/23/20 227 lb (103 kg)  07/13/20 230 lb 9.6 oz (104.6 kg)  05/29/20 225 lb (102.1 kg)     GEN:  Well nourished, well developed in no acute distress HEENT: Normal NECK: No JVD; No carotid bruits CARDIAC: RRR, no murmurs, rubs, gallops RESPIRATORY:  Clear to auscultation without rales, wheezing or rhonchi  ABDOMEN: Soft, non-tender, non-distended MUSCULOSKELETAL:  No edema; No deformity  SKIN: Warm and dry NEUROLOGIC:  Alert and oriented x 3 PSYCHIATRIC:  Normal affect   ASSESSMENT:    1. CAD in native artery   2. Essential hypertension   3. Hyperlipidemia, unspecified hyperlipidemia type    PLAN:     Nonobstructive CAD: Coronary CTA 08/17/2019 shows noncalcified plaque in the proximal LAD causing mild (25 to 49%) stenosis.  CT FFR 0.90.  Calcium score 0.  Normal LV systolic function.  No obstructive epicardial coronary artery disease, but given typical chest pain in diabetic with nonobstructive CAD, chest pain could be due to microvascular disease.  No proven therapies for microvascular disease, but there is evidence that exercise and risk factor  modification are beneficial.  Encourage patient to exercise regularly.  Currently denies any anginal symptoms. -Continue atorvastatin 20 mg daily  Hypertension: On valsartan-HCTZ 320-25 mg daily.  Appears well controlled  HLD: LDL 41 on 07/10/2020, continue atorvastatin 20 mg daily  Type 2 diabetes: on metformin and Ozempic.  A1c 5.9% on 07/10/2020, markedly improved from 10.1 on 12/15/2019.     RTC 1 year  Medication Adjustments/Labs and Tests Ordered: Current medicines are reviewed at length with the patient today.  Concerns regarding medicines are outlined above.  Orders Placed This Encounter  Procedures  . EKG 12-Lead   No orders of the defined types were placed in this encounter.   Patient Instructions  Medication Instructions:  Your physician recommends that you continue on your current medications as directed. Please refer to the Current Medication list given to you today.  *If you need a refill on your cardiac medications before your next appointment, please call your pharmacy*  Follow-Up: At [facility], you and your health needs are our priority.  As part of our continuing mission to provide you with exceptional heart care, we have created designated Provider Care Teams.  These Care Teams include your primary Cardiologist (physician) and Advanced Practice Providers (APPs -  Physician Assistants and Nurse Practitioners) who all work together to provide you with the care you need, when you need it.  We recommend signing up for the patient portal called "MyChart".  Sign up information is provided on this After Visit Summary.  MyChart is used to connect with patients for Virtual Visits (Telemedicine).  Patients are able to view lab/test results, encounter notes, upcoming appointments, etc.  Non-urgent messages can be sent to your provider as well.   To learn more about what you can do with MyChart, go to [email or web address].    Your next appointment:   12 month(s)  The  format for your next appointment:   In Person  Provider:   Oswaldo Milian, MD          Signed, Donato Heinz, MD  08/23/2020 8:25 AM    [place]

## 2020-08-23 ENCOUNTER — Encounter: Payer: Self-pay | Admitting: Cardiology

## 2020-08-23 ENCOUNTER — Other Ambulatory Visit: Payer: Self-pay

## 2020-08-23 ENCOUNTER — Ambulatory Visit (INDEPENDENT_AMBULATORY_CARE_PROVIDER_SITE_OTHER): Payer: 59 | Admitting: Cardiology

## 2020-08-23 VITALS — BP 108/72 | HR 72 | Ht 73.0 in | Wt 227.0 lb

## 2020-08-23 DIAGNOSIS — I10 Essential (primary) hypertension: Secondary | ICD-10-CM

## 2020-08-23 DIAGNOSIS — I2510 Atherosclerotic heart disease of native coronary artery without angina pectoris: Secondary | ICD-10-CM | POA: Diagnosis not present

## 2020-08-23 DIAGNOSIS — E785 Hyperlipidemia, unspecified: Secondary | ICD-10-CM | POA: Diagnosis not present

## 2020-08-23 NOTE — Patient Instructions (Signed)

## 2020-08-28 ENCOUNTER — Other Ambulatory Visit: Payer: Self-pay

## 2020-08-28 ENCOUNTER — Encounter: Payer: Self-pay | Admitting: Dermatology

## 2020-08-28 ENCOUNTER — Ambulatory Visit (INDEPENDENT_AMBULATORY_CARE_PROVIDER_SITE_OTHER): Payer: 59 | Admitting: Dermatology

## 2020-08-28 DIAGNOSIS — L814 Other melanin hyperpigmentation: Secondary | ICD-10-CM

## 2020-08-28 DIAGNOSIS — Z86018 Personal history of other benign neoplasm: Secondary | ICD-10-CM | POA: Diagnosis not present

## 2020-08-28 DIAGNOSIS — L821 Other seborrheic keratosis: Secondary | ICD-10-CM | POA: Diagnosis not present

## 2020-08-28 DIAGNOSIS — D1800 Hemangioma unspecified site: Secondary | ICD-10-CM

## 2020-08-28 DIAGNOSIS — L578 Other skin changes due to chronic exposure to nonionizing radiation: Secondary | ICD-10-CM

## 2020-08-28 DIAGNOSIS — Z1283 Encounter for screening for malignant neoplasm of skin: Secondary | ICD-10-CM | POA: Diagnosis not present

## 2020-08-28 DIAGNOSIS — L813 Cafe au lait spots: Secondary | ICD-10-CM | POA: Diagnosis not present

## 2020-08-28 DIAGNOSIS — D229 Melanocytic nevi, unspecified: Secondary | ICD-10-CM | POA: Diagnosis not present

## 2020-08-28 NOTE — Progress Notes (Signed)
   Follow-Up Visit   Subjective  NED C Bushart is a 44 y.o. male who presents for the following: Annual Exam (Hx dysplastic nevus). The patient presents for Total-Body Skin Exam (TBSE) for skin cancer screening and mole check.  The following portions of the chart were reviewed this encounter and updated as appropriate:   Tobacco  Allergies  Meds  Problems  Med Hx  Surg Hx  Fam Hx     Review of Systems:  No other skin or systemic complaints except as noted in HPI or Assessment and Plan.  Objective  Well appearing patient in no apparent distress; mood and affect are within normal limits.  A full examination was performed including scalp, head, eyes, ears, nose, lips, neck, chest, axillae, abdomen, back, buttocks, bilateral upper extremities, bilateral lower extremities, hands, feet, fingers, toes, fingernails, and toenails. All findings within normal limits unless otherwise noted below.  Objective  Left Thigh - Posterior: Speckled brown macule  Assessment & Plan  Cafe au lait spots Left Thigh - Posterior  Benign appearing, observe.   Skin cancer screening   Lentigines - Scattered tan macules - Discussed due to sun exposure - Benign, observe - Call for any changes  Seborrheic Keratoses - Stuck-on, waxy, tan-brown papules and plaques  - Discussed benign etiology and prognosis. - Observe - Call for any changes  Melanocytic Nevi - some slightly irregular on the L abdomen - Tan-brown and/or pink-flesh-colored symmetric macules and papules - Benign appearing on exam today - Observation - Call clinic for new or changing moles - Recommend daily use of broad spectrum spf 30+ sunscreen to sun-exposed areas.   Hemangiomas - Red papules - Discussed benign nature - Observe - Call for any changes  Actinic Damage - Chronic, secondary to cumulative UV/sun exposure - diffuse scaly erythematous macules with underlying dyspigmentation - Recommend daily broad spectrum  sunscreen SPF 30+ to sun-exposed areas, reapply every 2 hours as needed.  - Call for new or changing lesions.  History of Dysplastic Nevus - No evidence of recurrence today - Recommend regular full body skin exams - Recommend daily broad spectrum sunscreen SPF 30+ to sun-exposed areas, reapply every 2 hours as needed.  - Call if any new or changing lesions are noted between office visits  Skin cancer screening performed today.  Return in about 1 year (around 08/28/2021) for TBSE.  Luther Redo, CMA, am acting as scribe for Sarina Ser, MD .  Documentation: I have reviewed the above documentation for accuracy and completeness, and I agree with the above.  Sarina Ser, MD

## 2020-08-31 ENCOUNTER — Encounter: Payer: Self-pay | Admitting: Dermatology

## 2020-09-04 ENCOUNTER — Other Ambulatory Visit: Payer: Self-pay | Admitting: Cardiology

## 2020-09-05 ENCOUNTER — Other Ambulatory Visit: Payer: Self-pay | Admitting: Internal Medicine

## 2020-11-07 ENCOUNTER — Other Ambulatory Visit: Payer: Self-pay | Admitting: Family Medicine

## 2020-11-07 DIAGNOSIS — M1990 Unspecified osteoarthritis, unspecified site: Secondary | ICD-10-CM

## 2020-11-16 ENCOUNTER — Other Ambulatory Visit: Payer: Self-pay | Admitting: Cardiology

## 2020-12-05 ENCOUNTER — Other Ambulatory Visit: Payer: Self-pay | Admitting: Family Medicine

## 2020-12-05 ENCOUNTER — Other Ambulatory Visit: Payer: Self-pay | Admitting: Internal Medicine

## 2020-12-05 DIAGNOSIS — J0110 Acute frontal sinusitis, unspecified: Secondary | ICD-10-CM

## 2020-12-05 DIAGNOSIS — J3089 Other allergic rhinitis: Secondary | ICD-10-CM

## 2020-12-05 NOTE — Telephone Encounter (Signed)
   Notes to clinic: Medication not assigned to a protocol, review manually This script has expired  Review for continued use   Requested Prescriptions  Pending Prescriptions Disp Refills   ipratropium (ATROVENT) 0.06 % nasal spray [Pharmacy Med Name: IPRATROPIUM 0.06% SPRAY 0.06 Solution] 15 mL 3    Sig: PLACE 2 SPRAYS INTO BOTH NOSTRILS 3 TIMES DAILY AS NEEDED FOR RHINITIS.      Off-Protocol Failed - 12/05/2020 10:22 AM      Failed - Medication not assigned to a protocol, review manually.      Passed - Valid encounter within last 12 months    Recent Outpatient Visits           4 months ago Annual physical exam   [facility] Olin Hauser, DO   11 months ago Type 2 diabetes mellitus with other specified complication, without long-term current use of insulin [facility])   [facility] Olin Hauser, DO   1 year ago Acute non-recurrent frontal sinusitis   [place], DO   1 year ago Annual physical exam   [place], DO   1 year ago Suspected Covid-19 Virus Infection   [place], DO       Future Appointments             In 3 weeks Diamantina [place] Herbert Seta, MD [employer]   In 1 month [place], Devonne Doughty, DO [facility], [facility]            Off-Protocol Failed - 12/05/2020 10:22 AM      Failed - Medication not assigned to a protocol, review manually.      Passed - Valid encounter within last 12 months    Recent Outpatient Visits           4 months ago Annual physical exam   [place], DO   11 months ago Type 2 diabetes mellitus with other specified complication, without long-term current use of insulin [facility])   [facility] Olin Hauser, DO   1 year ago Acute non-recurrent frontal sinusitis   [place], DO   1 year ago Annual physical exam   [place], DO   1 year ago Suspected Covid-19 Virus Infection   [place], DO       Future Appointments             In 3 weeks Diamantina [place], Herbert Seta, MD [place]   In 1 month Parks Ranger, Devonne Doughty, [facility], [facility]

## 2020-12-14 ENCOUNTER — Ambulatory Visit: Payer: 59 | Admitting: Urology

## 2020-12-25 ENCOUNTER — Other Ambulatory Visit: Payer: Self-pay

## 2020-12-25 MED ORDER — TRINTELLIX 20 MG PO TABS
ORAL_TABLET | ORAL | 0 refills | Status: DC
  Filled 2020-12-25 – 2021-02-06 (×3): qty 30, 30d supply, fill #0

## 2020-12-25 MED FILL — Valsartan-Hydrochlorothiazide Tab 320-25 MG: ORAL | 30 days supply | Qty: 30 | Fill #0 | Status: AC

## 2020-12-27 ENCOUNTER — Ambulatory Visit: Payer: 59 | Admitting: Urology

## 2020-12-27 ENCOUNTER — Other Ambulatory Visit: Payer: Self-pay

## 2021-01-02 ENCOUNTER — Other Ambulatory Visit: Payer: Self-pay

## 2021-01-02 ENCOUNTER — Other Ambulatory Visit: Payer: Self-pay | Admitting: Family Medicine

## 2021-01-02 DIAGNOSIS — K219 Gastro-esophageal reflux disease without esophagitis: Secondary | ICD-10-CM

## 2021-01-02 MED ORDER — ESOMEPRAZOLE MAGNESIUM 40 MG PO CPDR
DELAYED_RELEASE_CAPSULE | Freq: Every day | ORAL | 1 refills | Status: DC
Start: 2021-01-02 — End: 2021-05-04
  Filled 2021-01-02: qty 90, 90d supply, fill #0
  Filled 2021-04-02: qty 90, 90d supply, fill #1

## 2021-01-02 MED FILL — Semaglutide Soln Pen-inj 0.25 or 0.5 MG/DOSE (2 MG/1.5ML): SUBCUTANEOUS | 28 days supply | Qty: 1.5 | Fill #0 | Status: AC

## 2021-01-02 NOTE — Telephone Encounter (Signed)
Requested Prescriptions  Pending Prescriptions Disp Refills  . esomeprazole (NEXIUM) 40 MG capsule 90 capsule 1    Sig: TAKE 1 CAPSULE BY MOUTH ONCE DAILY     Gastroenterology: Proton Pump Inhibitors Passed - 01/02/2021 11:28 AM      Passed - Valid encounter within last 12 months    Recent Outpatient Visits          5 months ago Annual physical exam   [place], DO   11 months ago Type 2 diabetes mellitus with other specified complication, without long-term current use of insulin [facility])   [facility] Olin Hauser, DO   1 year ago Acute non-recurrent frontal sinusitis   [place], DO   1 year ago Annual physical exam   [place], DO   1 year ago Suspected Covid-19 Virus Infection   [place], DO      Future Appointments            In 1 week Diamantina [place], Herbert Seta, MD [place]   In 1 week [place], Devonne Doughty, [facility], [facility]

## 2021-01-07 ENCOUNTER — Other Ambulatory Visit: Payer: Self-pay

## 2021-01-07 MED FILL — Metformin HCl Tab ER 24HR 500 MG: ORAL | 90 days supply | Qty: 360 | Fill #0 | Status: AC

## 2021-01-07 MED FILL — Etodolac Tab 500 MG: ORAL | 30 days supply | Qty: 60 | Fill #0 | Status: AC

## 2021-01-07 MED FILL — Empagliflozin Tab 25 MG: ORAL | 30 days supply | Qty: 30 | Fill #0 | Status: AC

## 2021-01-08 ENCOUNTER — Other Ambulatory Visit: Payer: Self-pay

## 2021-01-11 ENCOUNTER — Ambulatory Visit
Admission: RE | Admit: 2021-01-11 | Discharge: 2021-01-11 | Disposition: A | Discharge status: Home / Self Care | Payer: No Typology Code available for payment source | Attending: Urology | Admitting: Urology

## 2021-01-11 ENCOUNTER — Encounter: Payer: Self-pay | Admitting: Urology

## 2021-01-11 ENCOUNTER — Other Ambulatory Visit: Payer: Self-pay

## 2021-01-11 ENCOUNTER — Other Ambulatory Visit: Payer: Self-pay | Admitting: Urology

## 2021-01-11 ENCOUNTER — Ambulatory Visit (INDEPENDENT_AMBULATORY_CARE_PROVIDER_SITE_OTHER): Payer: No Typology Code available for payment source | Admitting: Urology

## 2021-01-11 ENCOUNTER — Ambulatory Visit
Admission: RE | Admit: 2021-01-11 | Discharge: 2021-01-11 | Disposition: A | Discharge status: Home / Self Care | Payer: No Typology Code available for payment source | Source: Ambulatory Visit | Attending: Urology | Admitting: Urology

## 2021-01-11 VITALS — BP 115/77 | HR 98 | Ht 73.0 in | Wt 215.0 lb

## 2021-01-11 DIAGNOSIS — N200 Calculus of kidney: Secondary | ICD-10-CM | POA: Diagnosis not present

## 2021-01-11 NOTE — Patient Instructions (Signed)

## 2021-01-11 NOTE — Progress Notes (Signed)
   01/11/2021 8:57 AM   [name] [birth year] [id number]  Reason for visit: Follow up nephrolithiasis  HPI: 44 year old male who had a right 4 mm distal ureteral stone and underwent right ureteroscopy, laser, and stent in September 2019.  He has had no urologic problems since that time.  He denies any stone episodes, flank pain, gross hematuria, or dysuria.  He denies any family history of prostate cancer.  I personally viewed and interpreted his KUB today that shows no obvious evidence of radiopaque stones.  We discussed general stone prevention strategies including adequate hydration with goal of producing 2.5 L of urine daily, increasing citric acid intake, increasing calcium intake during high oxalate meals, minimizing animal protein, and decreasing salt intake. Information about dietary recommendations given today.   Follow-up with urology as needed   [place], [place] [address] [place], [place] [address] [phone]

## 2021-01-15 ENCOUNTER — Ambulatory Visit: Payer: 59 | Admitting: Family Medicine

## 2021-01-16 LAB — HEMOGLOBIN A1C: Hemoglobin A1C: 5.9

## 2021-01-19 ENCOUNTER — Other Ambulatory Visit: Payer: Self-pay

## 2021-01-19 ENCOUNTER — Encounter: Payer: Self-pay | Admitting: Family Medicine

## 2021-01-19 ENCOUNTER — Other Ambulatory Visit: Payer: Self-pay | Admitting: Family Medicine

## 2021-01-19 ENCOUNTER — Ambulatory Visit: Payer: No Typology Code available for payment source | Admitting: Family Medicine

## 2021-01-19 VITALS — BP 116/79 | HR 76 | Ht 73.0 in | Wt 222.8 lb

## 2021-01-19 DIAGNOSIS — Z0000 Encounter for general adult medical examination without abnormal findings: Secondary | ICD-10-CM

## 2021-01-19 DIAGNOSIS — E785 Hyperlipidemia, unspecified: Secondary | ICD-10-CM

## 2021-01-19 DIAGNOSIS — I10 Essential (primary) hypertension: Secondary | ICD-10-CM

## 2021-01-19 DIAGNOSIS — E669 Obesity, unspecified: Secondary | ICD-10-CM

## 2021-01-19 DIAGNOSIS — E1169 Type 2 diabetes mellitus with other specified complication: Secondary | ICD-10-CM | POA: Diagnosis not present

## 2021-01-19 DIAGNOSIS — F3342 Major depressive disorder, recurrent, in full remission: Secondary | ICD-10-CM | POA: Diagnosis not present

## 2021-01-19 DIAGNOSIS — Z1159 Encounter for screening for other viral diseases: Secondary | ICD-10-CM

## 2021-01-19 DIAGNOSIS — Z125 Encounter for screening for malignant neoplasm of prostate: Secondary | ICD-10-CM

## 2021-01-19 NOTE — Assessment & Plan Note (Signed)
Controlled BP Home readings normal No known complications    Plan:  1. Continue current BP regimen - Valsartan-HCTZ 320-25mg daily 2. Encourage improved lifestyle - low sodium diet, regular exercise 3. May continue monitor BP outside office, bring readings to next visit, if persistently >140/90 or new symptoms notify office sooner 

## 2021-01-19 NOTE — Assessment & Plan Note (Signed)
Controlled cholesterol on statin +  lifestyle Last lipid panel 06/2020 Calculated ASCVD 10 yr risk score < 5% low risk despite DM  Plan: 1. Continue Atorvastatin 20mg 2. Encourage improved lifestyle - low carb/cholesterol, reduce portion size, continue improving regular exercise 3. Follow-up q 1 yr lipids 

## 2021-01-19 NOTE — Addendum Note (Signed)
Addended by: Olin Hauser on: 01/19/2021 09:41 AM   Modules accepted: Orders

## 2021-01-19 NOTE — Patient Instructions (Addendum)
Thank you for coming to the office today.    DUE for FASTING BLOOD WORK (no food or drink after midnight before the lab appointment, only water or coffee without cream/sugar on the morning of)  SCHEDULE "Lab Only" visit in the morning at the clinic for lab draw in 6 MONTHS   - Make sure Lab Only appointment is at about 1 week before your next appointment, so that results will be available  For Lab Results, once available within 2-3 days of blood draw, you can can log in to MyChart online to view your results and a brief explanation. Also, we can discuss results at next follow-up visit.     Colon Cancer Screening: - For all adults age 47+ routine colon cancer screening is highly recommended.     - Recent guidelines from [place] recommend starting age of 47 - Early detection of colon cancer is important, because often there are no warning signs or symptoms, also if found early usually it can be cured. Late stage is hard to treat.  - If you are not interested in Colonoscopy screening (if done and normal you could be cleared for 5 to 10 years until next due), then Cologuard is an excellent alternative for screening test for Colon Cancer. It is highly sensitive for detecting DNA of colon cancer from even the earliest stages. Also, there is NO bowel prep required. - If Cologuard is NEGATIVE, then it is good for 3 years before next due - If Cologuard is POSITIVE, then it is strongly advised to get a Colonoscopy, which allows the GI doctor to locate the source of the cancer or polyp (even very early stage) and treat it by removing it. ------------------------- If you would like to proceed with Cologuard (stool DNA test) - FIRST, call your insurance company and tell them you want to check cost of Cologuard tell them CPT Code [phone] (it may be completely covered and you could get for no cost, OR max cost without any coverage is about $600). Also, keep in mind if you do NOT open the kit,  and decide not to do the test, you will NOT be charged, you should contact the company if you decide not to do the test. - If you want to proceed, you can notify us (phone message, [place], or at next visit) and we will order it for you. The test kit will be delivered to you house within about 1 week. Follow instructions to collect sample, you may call the company for any help or questions, 24/7 telephone support at [phone].   Please schedule a Follow-up Appointment to: Return in about 6 months (around 07/22/2021) for 6 month follow-up fasting lab only then 1 week later Annual Physical.  If you have any other questions or concerns, please feel free to call the office or send a message through [place]. You may also schedule an earlier appointment if necessary.  Additionally, you may be receiving a survey about your experience at our office within a few days to 1 week by e-mail or mail. We value your feedback.  [name], DO [place]

## 2021-01-19 NOTE — Progress Notes (Signed)
Subjective:    Patient ID: [name], male    DOB: [birth year], 45 y.o.   MRN: [id number]  [name] is a 45 y.o. male presenting on 01/19/2021 for Diabetes and Hypertension   HPI   CHRONIC DM, Type 2 Followed by [facility] Endocrinology Dr Leontine Locket is managing his diabetes,every6 month. Recent A1c drawn 01/16/21 showed 5.9 (01/2021) Freestyle Libre (no stick CBG) CBG avg fasting 100, overall avg < 150, high >200 Meds:Metformin 2000mg XR daily at bedtime, Jardiance 25mg  daily, Ozempic 0.5mg  weekly No more nausea on GLP1 Reports good compliance. Tolerating well w/o side-effects Currently on ARB Lifestyle: Weight down 5 lbs in 5 months - Diet (Continues onimproved DM diet) - Exercise (increasing exercise) WIll see Dr Katy Fitch in 01/29/2021 and update eye exam Admits hypoglycemia 63 (Friday AM, felt symptomatic) Denies hypoglycemia  CHRONIC HTN: No new concerns.Admits some stress contributing. Home readings are improved 120-140 range. Current Meds - Valsartan-HCTZ320-25mg  dailyin AM(previously increased) Reports good compliance, took meds today. Tolerating well, w/o complaints.  History of Depression, in remission/PossibleSeasonal Affective Disorder - Reports chronic problem has episodic flaresusually related to life stressors and winter months - Followed by [facility] Psychiatry- on Duloxetine 60mg  daily, Trintellix 20mg  daily. - Reports he is doing well overall, currently depression is controlled, he feels like it is in remission overall     Depression screen [facility] 2/9 01/19/2021 07/13/2020 01/10/2020  Decreased Interest 0 1 0  Down, Depressed, Hopeless 1 0 0  PHQ - 2 Score 1 1 0  Altered sleeping 0 0 0  Tired, decreased energy 0 2 0  Change in appetite 1 0 0  Feeling bad or failure about yourself  0 0 0  Trouble concentrating 0 0 0  Moving slowly or fidgety/restless 0 0 0  Suicidal thoughts 0 0 0  PHQ-9 Score 2 3 0  Difficult doing work/chores Not difficult  at all Not difficult at all Not difficult at all  Some recent data might be hidden    Social History   Tobacco Use  . Smoking status: Never Smoker  . Smokeless tobacco: Never Used  Vaping Use  . Vaping Use: Never used  Substance Use Topics  . Alcohol use: Yes    Alcohol/week: 0.0 standard drinks    Comment: RARE  . Drug use: No    Review of Systems Per HPI unless specifically indicated above     Objective:    BP 116/79   Pulse 76   Ht 6\' 1"  (1.854 m)   Wt 222 lb 12.8 oz (101.1 kg)   SpO2 99%   BMI 29.39 kg/m   Wt Readings from Last 3 Encounters:  01/19/21 222 lb 12.8 oz (101.1 kg)  01/11/21 215 lb (97.5 kg)  08/23/20 227 lb (103 kg)    Physical Exam Vitals and nursing note reviewed.  Constitutional:      General: He is not in acute distress.    Appearance: He is well-developed. He is not diaphoretic.     Comments: Well-appearing, comfortable, cooperative  HENT:     Head: Normocephalic and atraumatic.  Eyes:     General:        Right eye: No discharge.        Left eye: No discharge.     Conjunctiva/sclera: Conjunctivae normal.  Cardiovascular:     Rate and Rhythm: Normal rate.  Pulmonary:     Effort: Pulmonary effort is normal.  Skin:    General: Skin is warm and dry.  Findings: No erythema or rash.  Neurological:     Mental Status: He is alert and oriented to person, place, and time.  Psychiatric:        Behavior: Behavior normal.     Comments: Well groomed, good eye contact, normal speech and thoughts      Diabetic Foot Exam - Simple   Simple Foot Form Diabetic Foot exam was performed with the following findings: Yes 01/19/2021  9:16 AM  Visual Inspection No deformities, no ulcerations, no other skin breakdown bilaterally: Yes Sensation Testing Intact to touch and monofilament testing bilaterally: Yes Pulse Check Posterior Tibialis and Dorsalis pulse intact bilaterally: Yes Comments     Results for orders placed or performed in visit on  01/19/21  Hemoglobin A1c  Result Value Ref Range   Hemoglobin A1C 5.9       Assessment & Plan:   Problem List Items Addressed This Visit    Hyperlipidemia associated with type 2 diabetes mellitus ([place])    Controlled cholesterol on statin +  lifestyle Last lipid panel 06/2020 Calculated ASCVD 10 yr risk score < 5% low risk despite DM  Plan: 1. Continue Atorvastatin 20mg  2. Encourage improved lifestyle - low carb/cholesterol, reduce portion size, continue improving regular exercise 3. Follow-up q 1 yr lipids      Essential hypertension    Controlled BP Home readings normal No known complications    Plan:  1. Continue current BP regimen - Valsartan-HCTZ 320-25mg  daily 2. Encourage improved lifestyle - low sodium diet, regular exercise 3. May continue monitor BP outside office, bring readings to next visit, if persistently >140/90 or new symptoms notify office sooner      Diabetes mellitus ([place]) - Primary    Well controlled A1c 5.9 on last A1c from Endocrine Complicated with HLD, HTN, GERD Followed by [facility] Endocrinology Dr Gabriel Carina Off Farxiga due to insurance, no longer on sulfonylurea  Plan:  1. Continue current therapy - Metformin XR 2000mg  daily, Jardiance 25mg  daily, Ozempic 0.5mg  weekly 2. Encourage improved lifestyle - low carb, low sugar diet, reduce portion size, continue improving regular exercise 3. Continue f/u Endocrinology as scheduled 4. DM Foot today, DM eye 5/16      Depression, major, recurrent, in complete remission ([place])    Stable, controlled. Seems currently in remission. Suspected factor with Seasonal Affective Disorder Followed by Psychiatry Dr Toy Care in [place] Continue Duloxetine 60mg  + Trintellix 20mg  daily Off Wellbutrin, Fluoxetine           No orders of the defined types were placed in this encounter.    Follow up plan: Return in about 6 months (around 07/22/2021) for 6 month follow-up fasting lab only then 1 week later Annual  Physical.  Future labs ordered for 08/06/21   [name], [facility] 01/19/2021, 9:02 AM

## 2021-01-19 NOTE — Assessment & Plan Note (Signed)
Stable, controlled. Seems currently in remission. Suspected factor with Seasonal Affective Disorder Followed by Psychiatry Dr Kaur in [place] Continue Duloxetine 60mg + Trintellix 20mg daily Off Wellbutrin, Fluoxetine 

## 2021-01-19 NOTE — Assessment & Plan Note (Signed)
Well controlled A1c 5.9 on last A1c from Endocrine Complicated with HLD, HTN, GERD Followed by [facility] Endocrinology Dr Gabriel Carina Off Farxiga due to insurance, no longer on sulfonylurea  Plan:  1. Continue current therapy - Metformin XR 2000mg  daily, Jardiance 25mg  daily, Ozempic 0.5mg  weekly 2. Encourage improved lifestyle - low carb, low sugar diet, reduce portion size, continue improving regular exercise 3. Continue f/u Endocrinology as scheduled 4. DM Foot today, DM eye 5/16

## 2021-01-23 ENCOUNTER — Other Ambulatory Visit: Payer: Self-pay

## 2021-01-23 MED FILL — Valsartan-Hydrochlorothiazide Tab 320-25 MG: ORAL | 30 days supply | Qty: 30 | Fill #1 | Status: AC

## 2021-02-06 ENCOUNTER — Other Ambulatory Visit: Payer: Self-pay

## 2021-02-06 ENCOUNTER — Other Ambulatory Visit: Payer: Self-pay | Admitting: Family Medicine

## 2021-02-06 DIAGNOSIS — M1990 Unspecified osteoarthritis, unspecified site: Secondary | ICD-10-CM

## 2021-02-06 MED ORDER — ETODOLAC 500 MG PO TABS
ORAL_TABLET | ORAL | 2 refills | Status: DC
  Filled 2021-02-06: qty 60, 30d supply, fill #0
  Filled 2021-03-06: qty 60, 30d supply, fill #1
  Filled 2021-04-08: qty 60, 30d supply, fill #2

## 2021-02-06 MED FILL — Semaglutide Soln Pen-inj 0.25 or 0.5 MG/DOSE (2 MG/1.5ML): SUBCUTANEOUS | 28 days supply | Qty: 1.5 | Fill #1 | Status: AC

## 2021-02-06 MED FILL — Empagliflozin Tab 25 MG: ORAL | 30 days supply | Qty: 30 | Fill #1 | Status: AC

## 2021-02-19 ENCOUNTER — Other Ambulatory Visit: Payer: Self-pay

## 2021-02-19 MED FILL — Valsartan-Hydrochlorothiazide Tab 320-25 MG: ORAL | 30 days supply | Qty: 30 | Fill #2 | Status: AC

## 2021-02-26 ENCOUNTER — Other Ambulatory Visit: Payer: Self-pay

## 2021-03-07 ENCOUNTER — Other Ambulatory Visit: Payer: Self-pay

## 2021-03-09 ENCOUNTER — Other Ambulatory Visit: Payer: Self-pay

## 2021-03-09 MED FILL — Empagliflozin Tab 25 MG: ORAL | 30 days supply | Qty: 30 | Fill #2 | Status: AC

## 2021-03-09 MED FILL — Semaglutide Soln Pen-inj 0.25 or 0.5 MG/DOSE (2 MG/1.5ML): SUBCUTANEOUS | 28 days supply | Qty: 1.5 | Fill #2 | Status: AC

## 2021-03-09 MED FILL — Atorvastatin Calcium Tab 20 MG (Base Equivalent): ORAL | 90 days supply | Qty: 90 | Fill #0 | Status: AC

## 2021-03-16 ENCOUNTER — Other Ambulatory Visit: Payer: Self-pay

## 2021-03-16 MED ORDER — METFORMIN HCL ER 500 MG PO TB24
ORAL_TABLET | ORAL | 3 refills | Status: DC
  Filled 2021-03-16: qty 360, 90d supply, fill #0

## 2021-03-16 MED ORDER — JARDIANCE 25 MG PO TABS
25.0000 mg | ORAL_TABLET | Freq: Every day | ORAL | 5 refills | Status: DC
  Filled 2021-03-16: qty 30, 30d supply, fill #0

## 2021-03-16 MED ORDER — OZEMPIC (0.25 OR 0.5 MG/DOSE) 2 MG/1.5ML ~~LOC~~ SOPN
PEN_INJECTOR | SUBCUTANEOUS | 3 refills | Status: AC
  Filled 2021-03-16: qty 4.5, 84d supply, fill #0
  Filled 2021-04-08: qty 1.5, 28d supply, fill #0
  Filled 2021-05-04: qty 1.5, 28d supply, fill #1
  Filled 2021-05-10: qty 4.5, 63d supply, fill #1
  Filled 2021-05-10: qty 4.5, 84d supply, fill #1
  Filled 2021-05-11: qty 1.5, 28d supply, fill #1
  Filled 2021-05-11: qty 4.5, 84d supply, fill #1
  Filled 2021-05-11: qty 1.5, 28d supply, fill #1
  Filled 2021-05-11 – 2021-08-28 (×3): qty 1.5, 28d supply, fill #2
  Filled 2021-11-26 – 2021-12-03 (×2): qty 1.5, 28d supply, fill #3

## 2021-03-22 MED FILL — Valsartan-Hydrochlorothiazide Tab 320-25 MG: ORAL | 30 days supply | Qty: 30 | Fill #3 | Status: AC

## 2021-03-23 ENCOUNTER — Other Ambulatory Visit: Payer: Self-pay

## 2021-04-02 ENCOUNTER — Other Ambulatory Visit: Payer: Self-pay

## 2021-04-08 MED FILL — Empagliflozin Tab 25 MG: ORAL | 30 days supply | Qty: 30 | Fill #3 | Status: AC

## 2021-04-09 ENCOUNTER — Other Ambulatory Visit: Payer: Self-pay

## 2021-04-18 MED FILL — Valsartan-Hydrochlorothiazide Tab 320-25 MG: ORAL | 30 days supply | Qty: 30 | Fill #4 | Status: AC

## 2021-04-19 ENCOUNTER — Other Ambulatory Visit: Payer: Self-pay

## 2021-04-25 ENCOUNTER — Encounter: Payer: Self-pay | Admitting: Family Medicine

## 2021-04-25 DIAGNOSIS — M1990 Unspecified osteoarthritis, unspecified site: Secondary | ICD-10-CM

## 2021-04-30 ENCOUNTER — Other Ambulatory Visit: Payer: Self-pay

## 2021-04-30 MED ORDER — ETODOLAC 500 MG PO TABS
ORAL_TABLET | ORAL | 3 refills | Status: DC
  Filled 2021-04-30: qty 180, 90d supply, fill #0
  Filled 2021-07-31: qty 60, 30d supply, fill #1
  Filled 2021-07-31: qty 60, 30d supply, fill #0
  Filled 2021-08-21: qty 60, 30d supply, fill #1
  Filled 2021-08-28: qty 180, 90d supply, fill #1
  Filled 2021-11-26: qty 180, 90d supply, fill #2
  Filled 2022-03-04: qty 14, 7d supply, fill #3

## 2021-04-30 MED FILL — Ipratropium Bromide Nasal Soln 0.06% (42 MCG/SPRAY): NASAL | 30 days supply | Qty: 15 | Fill #0 | Status: AC

## 2021-04-30 NOTE — Addendum Note (Signed)
Addended by: Olin Hauser on: 04/30/2021 05:54 PM   Modules accepted: Orders

## 2021-04-30 NOTE — Progress Notes (Signed)
Cardiology Office Note:    Date:  05/02/2021   ID:  [name], DOB 05/13/1976, MRN [id number]  PCP:  Olin Hauser, DO  Cardiologist:  None  Electrophysiologist:  None   Referring MD: [name] *   Chief Complaint  Patient presents with   Coronary Artery Disease     History of Present Illness:    [name] is a 45 y.o. male with a hx of type 2 diabetes, hypertension, nonobstructive CAD who presents for follow-up.   He saw Dr. Tamala Julian for chest pain around 2010 and had a exercise treadmill test that was negative.  He was initially seen on 07/28/2019 for exertional chest pain.  Coronary CTA was done on 08/17/2019, which showed nonobstructive CAD (noncalcified plaque in the proximal LAD causing 25 to 49% stenosis, CT FFR 0.9; calcium score 0).  TTE 08/09/2019 showed normal systolic function.    Since last clinic visit, he reports that he has been doing well.  Denies any chest pain, dyspnea, syncope, lower extremity edema, or palpitations.  Reports some lightheadedness if he does not keep up with his fluids.  He is walking 1 to 1.5 miles per day.  Denies any exertional symptoms.   Wt Readings from Last 3 Encounters:  05/02/21 221 lb (100.2 kg)  01/19/21 222 lb 12.8 oz (101.1 kg)  01/11/21 215 lb (97.5 kg)     Past Medical History:  Diagnosis Date   Chest pain    Dysplastic nevus 05/09/2020 excised   left low back lat above waistline/flank, severe   Family history of adverse reaction to anesthesia    MOM-N/V, DAD-HAS PROBLEMS WITH NOVICAINE   Food allergy    GERD (gastroesophageal reflux disease)    History of hiatal hernia    History of kidney stones    H/O   Insomnia    Joint pain    Plantar fasciitis    Seasonal allergies    Tendonitis of foot    left    Past Surgical History:  Procedure Laterality Date   CARPAL TUNNEL RELEASE Right 07/03/2017   Procedure: CARPAL TUNNEL RELEASE;  Surgeon: Hessie Knows, MD;  Location: [facility] ORS;   Service: Orthopedics;  Laterality: Right;   clavide surgery  2005   CYSTOSCOPY/URETEROSCOPY/HOLMIUM LASER/STENT PLACEMENT Right 05/22/2018   Procedure: CYSTOSCOPY/URETEROSCOPY/HOLMIUM LASER/STENT PLACEMENT;  Surgeon: Billey Co, MD;  Location: [facility] ORS;  Service: Urology;  Laterality: Right;   KNEE SURGERY Right 2013   SHOULDER SURGERY Right 2008    Current Medications: Current Meds  Medication Sig   empagliflozin (JARDIANCE) 25 MG TABS tablet TAKE 1 TABLET BY MOUTH ONCE DAILY   esomeprazole (NEXIUM) 40 MG capsule TAKE 1 CAPSULE BY MOUTH ONCE DAILY   etodolac (LODINE) 500 MG tablet TAKE 1 TABLET BY MOUTH TWICE DAILY AS NEEDED FOR MODERATE PAIN   metFORMIN (GLUCOPHAGE-XR) 500 MG 24 hr tablet Take 4 tablets (2,000 mg total) by mouth daily with breakfast.   Semaglutide,0.25 or 0.'5MG'$ /DOS, (OZEMPIC, 0.25 OR 0.5 MG/DOSE,) 2 MG/1.5ML SOPN Inject 0.375 mLs (0.5 mg total) subcutaneously once a week   [DISCONTINUED] atorvastatin (LIPITOR) 20 MG tablet TAKE 1 TABLET BY MOUTH DAILY AT 6 PM.   [DISCONTINUED] nitroGLYCERIN (NITROSTAT) 0.4 MG SL tablet Place under the tongue.   [DISCONTINUED] valsartan-hydrochlorothiazide (DIOVAN-HCT) 320-25 MG tablet TAKE 1 TABLET BY MOUTH DAILY.     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)  Tobacco Use   Smoking status: Never   Smokeless tobacco: Never  Vaping Use   Vaping Use: Never used  Substance and Sexual Activity   Alcohol use: Yes    Alcohol/week: 0.0 standard drinks    Comment: RARE   Drug use: No   Sexual activity: Yes  Other Topics Concern   Not on file  Social History Narrative   Not on file   Social Determinants of Health   Financial Resource Strain:  Not on file  Food Insecurity: Not on file  Transportation Needs: Not on file  Physical Activity: Not on file  Stress: Not on file  Social Connections: Not on file     Family History: The patient's family history includes Alcoholism in his father; Cancer in his brother, maternal grandfather, and mother; Diabetes in his father; Heart disease (age of onset: 40) in his father; Hyperlipidemia in his father and mother; Hypertension in his father; Stroke in his maternal grandfather.  ROS:   Please see the history of present illness.    All other systems reviewed and are negative.  EKGs/Labs/Other Studies Reviewed:    The following studies were reviewed today:   EKG:  EKG is ordered today.  The ekg ordered today demonstrates normal sinus rhythm, rate 72, no ST/T abnormalities, Qtc 424  Coronary CTA 08/17/19: 1. Coronary calcium score of 0. This was 0 percentile for age and sex matched control. 2.  Normal coronary origin with right dominance. 3. Nonobstructive CAD, with noncalcified plaque causing mild stenosis (25-49%) in the proximal LAD 4. There is a step artifact in the RPLB that precludes interpretation of a portion of the vessel CAD-RADS 2. Mild non-obstructive CAD (25-49%). Consider non-atherosclerotic causes of chest pain. Consider preventive therapy and risk factor modification.  CTFFR 08/17/19: 1. CT-FFR across lesion in proximal LAD is 0.9, suggesting lesion is not functionally significant  TTE 08/09/19:   1. Left ventricular ejection fraction, by visual estimation, is 55 to 60%. The left ventricle has normal function. Left ventricular septal wall thickness was mildly increased. Mildly increased left ventricular posterior wall thickness. There is mildly  increased left ventricular hypertrophy.  2. Left ventricular diastolic parameters are consistent with Grade I diastolic dysfunction (impaired relaxation).  3. Global right ventricle has normal systolic function.The right  ventricular size is normal. No increase in right ventricular wall thickness.  4. Left atrial size was normal.  5. Right atrial size was normal.  6. The mitral valve is normal in structure. No evidence of mitral valve regurgitation. No evidence of mitral stenosis.  7. The tricuspid valve is normal in structure. Tricuspid valve regurgitation is trivial.  8. The aortic valve is tricuspid. Aortic valve regurgitation is not visualized. No evidence of aortic valve sclerosis or stenosis.  9. The pulmonic valve was normal in structure. Pulmonic valve regurgitation is not visualized. 10. Normal pulmonary artery systolic pressure. 11. The inferior vena cava is normal in size with greater than 50% respiratory variability, suggesting right atrial pressure of 3 mmHg.  Recent Labs: 07/10/2020: ALT 16; BUN 22; Creat 1.11; Hemoglobin 15.4; Platelets 301; Potassium 3.8; Sodium 139  Recent Lipid Panel    Component Value Date/Time   CHOL 112 07/10/2020 0821   CHOL 139 12/27/2019 1008   TRIG  71 07/10/2020 0821   HDL 56 07/10/2020 0821   HDL 46 12/27/2019 1008   CHOLHDL 2.0 07/10/2020 0821   LDLCALC 41 07/10/2020 0821    Physical Exam:    VS:  BP 106/82 (BP Location: Left Arm, Patient Position: Sitting, Cuff Size: Large)   Pulse 72   Ht '6\' 1"'$  (1.854 m)   Wt 221 lb (100.2 kg)   SpO2 97%   BMI 29.16 kg/m     Wt Readings from Last 3 Encounters:  05/02/21 221 lb (100.2 kg)  01/19/21 222 lb 12.8 oz (101.1 kg)  01/11/21 215 lb (97.5 kg)     GEN:  Well nourished, well developed in no acute distress HEENT: Normal NECK: No JVD; No carotid bruits CARDIAC: RRR, no murmurs, rubs, gallops RESPIRATORY:  Clear to auscultation without rales, wheezing or rhonchi  ABDOMEN: Soft, non-tender, non-distended MUSCULOSKELETAL:  No edema; No deformity  SKIN: Warm and dry NEUROLOGIC:  Alert and oriented x 3 PSYCHIATRIC:  Normal affect   ASSESSMENT:    1. CAD in native artery   2. Essential hypertension   3.  Hyperlipidemia, unspecified hyperlipidemia type     PLAN:     Nonobstructive CAD: Coronary CTA 08/17/2019 shows noncalcified plaque in the proximal LAD causing mild (25 to 49%) stenosis.  CT FFR 0.90.  Calcium score 0.  Normal LV systolic function.  No obstructive epicardial coronary artery disease, but given typical chest pain in diabetic with nonobstructive CAD, chest pain could be due to microvascular disease.  No proven therapies for microvascular disease, but there is evidence that exercise and risk factor modification are beneficial.  Encourage patient to exercise regularly.  Currently denies any anginal symptoms. -Continue atorvastatin 20 mg daily  Hypertension: On valsartan-HCTZ 320-25 mg daily.  Appears well controlled  HLD: LDL 41 on 07/10/2020, continue atorvastatin 20 mg daily  Type 2 diabetes: on metformin and Ozempic.  A1c 5.9% on 01/16/21, markedly improved from 10.1 on 12/15/2019.     RTC in  1 year  Medication Adjustments/Labs and Tests Ordered: Current medicines are reviewed at length with the patient today.  Concerns regarding medicines are outlined above.  Orders Placed This Encounter  Procedures   EKG 12-Lead    Meds ordered this encounter  Medications   valsartan-hydrochlorothiazide (DIOVAN-HCT) 320-25 MG tablet    Sig: TAKE 1 TABLET BY MOUTH DAILY.    Dispense:  360 tablet    Refill:  0   nitroGLYCERIN (NITROSTAT) 0.4 MG SL tablet    Sig: Place 1 tablet (0.4 mg total) under the tongue every 5 (five) minutes as needed for chest pain.    Dispense:  25 tablet    Refill:  3   atorvastatin (LIPITOR) 20 MG tablet    Sig: TAKE 1 TABLET BY MOUTH DAILY AT 6 PM.    Dispense:  90 tablet    Refill:  3     Patient Instructions  Medication Instructions:  Your physician recommends that you continue on your current medications as directed. Please refer to the Current Medication list given to you today.  *If you need a refill on your cardiac medications before your  next appointment, please call your pharmacy*  Follow-Up: At [facility], you and your health needs are our priority.  As part of our continuing mission to provide you with exceptional heart care, we have created designated Provider Care Teams.  These Care Teams include your primary Cardiologist (physician) and Advanced Practice Providers (APPs -  Physician Assistants and  Nurse Practitioners) who all work together to provide you with the care you need, when you need it.  We recommend signing up for the patient portal called "MyChart".  Sign up information is provided on this After Visit Summary.  MyChart is used to connect with patients for Virtual Visits (Telemedicine).  Patients are able to view lab/test results, encounter notes, upcoming appointments, etc.  Non-urgent messages can be sent to your provider as well.   To learn more about what you can do with MyChart, go to [email or web address].    Your next appointment:   12 month(s)  The format for your next appointment:   In Person  Provider:   Oswaldo Milian, MD      Signed, Donato Heinz, MD  05/02/2021 11:46 AM    [place]

## 2021-05-01 ENCOUNTER — Other Ambulatory Visit: Payer: Self-pay

## 2021-05-02 ENCOUNTER — Other Ambulatory Visit: Payer: Self-pay

## 2021-05-02 ENCOUNTER — Encounter: Payer: Self-pay | Admitting: Cardiology

## 2021-05-02 ENCOUNTER — Ambulatory Visit: Payer: No Typology Code available for payment source | Admitting: Cardiology

## 2021-05-02 VITALS — BP 106/82 | HR 72 | Ht 73.0 in | Wt 221.0 lb

## 2021-05-02 DIAGNOSIS — E785 Hyperlipidemia, unspecified: Secondary | ICD-10-CM

## 2021-05-02 DIAGNOSIS — I2510 Atherosclerotic heart disease of native coronary artery without angina pectoris: Secondary | ICD-10-CM

## 2021-05-02 DIAGNOSIS — I10 Essential (primary) hypertension: Secondary | ICD-10-CM | POA: Diagnosis not present

## 2021-05-02 MED ORDER — VALSARTAN-HYDROCHLOROTHIAZIDE 320-25 MG PO TABS
1.0000 | ORAL_TABLET | Freq: Every day | ORAL | 0 refills | Status: DC
  Filled 2021-05-02 – 2021-05-04 (×2): qty 90, 90d supply, fill #0
  Filled 2021-08-21: qty 90, 90d supply, fill #1
  Filled 2021-11-26: qty 90, 90d supply, fill #2
  Filled 2022-03-04: qty 7, 7d supply, fill #3

## 2021-05-02 MED ORDER — ATORVASTATIN CALCIUM 20 MG PO TABS
ORAL_TABLET | ORAL | 3 refills | Status: DC
  Filled 2021-05-02 – 2021-07-31 (×6): qty 90, 90d supply, fill #0
  Filled 2021-08-21 – 2021-11-26 (×2): qty 90, 90d supply, fill #1

## 2021-05-02 MED ORDER — NITROGLYCERIN 0.4 MG SL SUBL
0.4000 mg | SUBLINGUAL_TABLET | SUBLINGUAL | 3 refills | Status: AC | PRN
  Filled 2021-05-02: qty 25, 5d supply, fill #0

## 2021-05-02 NOTE — Patient Instructions (Signed)

## 2021-05-04 ENCOUNTER — Other Ambulatory Visit: Payer: Self-pay

## 2021-05-04 ENCOUNTER — Other Ambulatory Visit: Payer: Self-pay | Admitting: Family Medicine

## 2021-05-04 DIAGNOSIS — K219 Gastro-esophageal reflux disease without esophagitis: Secondary | ICD-10-CM

## 2021-05-04 MED ORDER — JARDIANCE 25 MG PO TABS
25.0000 mg | ORAL_TABLET | Freq: Every day | ORAL | 1 refills | Status: DC
  Filled 2021-05-04: qty 90, 90d supply, fill #0
  Filled 2021-08-21 – 2021-08-28 (×2): qty 90, 90d supply, fill #1

## 2021-05-04 MED ORDER — METFORMIN HCL ER 500 MG PO TB24
ORAL_TABLET | ORAL | 3 refills | Status: DC
  Filled 2021-05-04 – 2021-05-30 (×4): qty 360, 90d supply, fill #0
  Filled 2021-08-21 – 2021-10-11 (×2): qty 360, 90d supply, fill #1
  Filled 2022-01-04: qty 360, 90d supply, fill #2

## 2021-05-04 MED ORDER — ESOMEPRAZOLE MAGNESIUM 40 MG PO CPDR
DELAYED_RELEASE_CAPSULE | Freq: Every day | ORAL | 1 refills | Status: AC
  Filled 2021-05-04 – 2021-05-11 (×2): qty 90, 90d supply, fill #0
  Filled 2021-07-31: qty 30, 30d supply, fill #0
  Filled 2021-08-28 – 2022-01-04 (×2): qty 90, 90d supply, fill #0

## 2021-05-04 MED FILL — Empagliflozin Tab 25 MG: ORAL | 30 days supply | Qty: 30 | Fill #4 | Status: CN

## 2021-05-07 ENCOUNTER — Other Ambulatory Visit: Payer: Self-pay

## 2021-05-10 ENCOUNTER — Other Ambulatory Visit: Payer: Self-pay

## 2021-05-11 ENCOUNTER — Other Ambulatory Visit: Payer: Self-pay

## 2021-05-30 ENCOUNTER — Other Ambulatory Visit: Payer: Self-pay

## 2021-07-31 ENCOUNTER — Other Ambulatory Visit (HOSPITAL_COMMUNITY): Payer: Self-pay

## 2021-08-02 ENCOUNTER — Encounter: Payer: Self-pay | Admitting: Family Medicine

## 2021-08-06 ENCOUNTER — Other Ambulatory Visit: Payer: No Typology Code available for payment source

## 2021-08-13 ENCOUNTER — Encounter: Payer: No Typology Code available for payment source | Admitting: Family Medicine

## 2021-08-21 ENCOUNTER — Other Ambulatory Visit (HOSPITAL_COMMUNITY): Payer: Self-pay

## 2021-08-28 ENCOUNTER — Other Ambulatory Visit (HOSPITAL_COMMUNITY): Payer: Self-pay

## 2021-08-29 ENCOUNTER — Other Ambulatory Visit (HOSPITAL_COMMUNITY): Payer: Self-pay

## 2021-08-31 ENCOUNTER — Other Ambulatory Visit (HOSPITAL_COMMUNITY): Payer: Self-pay

## 2021-09-03 ENCOUNTER — Encounter: Payer: 59 | Admitting: Dermatology

## 2021-10-11 ENCOUNTER — Other Ambulatory Visit: Payer: Self-pay

## 2021-10-22 ENCOUNTER — Telehealth: Payer: Self-pay

## 2021-10-22 NOTE — Telephone Encounter (Signed)
I attempted to contact the patient, no answer. LMOM to return my call.

## 2021-11-26 ENCOUNTER — Other Ambulatory Visit (HOSPITAL_COMMUNITY): Payer: Self-pay

## 2021-11-27 ENCOUNTER — Other Ambulatory Visit (HOSPITAL_COMMUNITY): Payer: Self-pay

## 2021-11-28 ENCOUNTER — Other Ambulatory Visit (HOSPITAL_COMMUNITY): Payer: Self-pay

## 2021-11-29 ENCOUNTER — Other Ambulatory Visit (HOSPITAL_COMMUNITY): Payer: Self-pay

## 2021-11-30 ENCOUNTER — Other Ambulatory Visit (HOSPITAL_COMMUNITY): Payer: Self-pay

## 2021-12-03 ENCOUNTER — Other Ambulatory Visit (HOSPITAL_COMMUNITY): Payer: Self-pay

## 2021-12-05 ENCOUNTER — Ambulatory Visit: Payer: Self-pay | Admitting: Dermatology

## 2021-12-05 ENCOUNTER — Other Ambulatory Visit (HOSPITAL_COMMUNITY): Payer: Self-pay

## 2021-12-06 ENCOUNTER — Other Ambulatory Visit (HOSPITAL_COMMUNITY): Payer: Self-pay

## 2021-12-06 MED ORDER — JARDIANCE 25 MG PO TABS
25.0000 mg | ORAL_TABLET | Freq: Every day | ORAL | 0 refills | Status: DC
  Filled 2021-12-06: qty 30, 30d supply, fill #0

## 2021-12-06 MED ORDER — OZEMPIC (0.25 OR 0.5 MG/DOSE) 2 MG/3ML ~~LOC~~ SOPN
PEN_INJECTOR | SUBCUTANEOUS | 0 refills | Status: DC
  Filled 2021-12-06: qty 3, 28d supply, fill #0

## 2021-12-07 ENCOUNTER — Other Ambulatory Visit (HOSPITAL_COMMUNITY): Payer: Self-pay

## 2021-12-27 ENCOUNTER — Other Ambulatory Visit (HOSPITAL_COMMUNITY): Payer: Self-pay

## 2021-12-27 MED ORDER — OZEMPIC (0.25 OR 0.5 MG/DOSE) 2 MG/3ML ~~LOC~~ SOPN
PEN_INJECTOR | SUBCUTANEOUS | 3 refills | Status: AC
  Filled 2021-12-27: qty 3, 28d supply, fill #0

## 2021-12-27 MED ORDER — PRECISION QID TEST VI STRP: ORAL_STRIP | 12 refills | Status: AC

## 2021-12-27 MED ORDER — JARDIANCE 25 MG PO TABS
25.0000 mg | ORAL_TABLET | Freq: Every day | ORAL | 3 refills | Status: DC
  Filled 2022-01-04: qty 90, 90d supply, fill #0

## 2021-12-27 MED ORDER — METFORMIN HCL ER 500 MG PO TB24: ORAL_TABLET | ORAL | 3 refills | Status: DC

## 2022-01-04 ENCOUNTER — Other Ambulatory Visit (HOSPITAL_COMMUNITY): Payer: Self-pay

## 2022-03-04 ENCOUNTER — Other Ambulatory Visit (HOSPITAL_COMMUNITY): Payer: Self-pay

## 2022-03-11 ENCOUNTER — Other Ambulatory Visit: Payer: Self-pay

## 2022-03-13 ENCOUNTER — Other Ambulatory Visit (HOSPITAL_COMMUNITY): Payer: Self-pay

## 2022-03-13 ENCOUNTER — Other Ambulatory Visit: Payer: Self-pay

## 2022-03-14 ENCOUNTER — Other Ambulatory Visit (HOSPITAL_COMMUNITY): Payer: Self-pay

## 2022-03-14 ENCOUNTER — Other Ambulatory Visit: Payer: Self-pay

## 2022-04-24 ENCOUNTER — Encounter (INDEPENDENT_AMBULATORY_CARE_PROVIDER_SITE_OTHER): Payer: Self-pay

## 2022-05-03 ENCOUNTER — Encounter: Payer: Self-pay | Admitting: Family Medicine

## 2022-05-03 DIAGNOSIS — M1990 Unspecified osteoarthritis, unspecified site: Secondary | ICD-10-CM

## 2022-05-03 DIAGNOSIS — I10 Essential (primary) hypertension: Secondary | ICD-10-CM

## 2022-05-03 DIAGNOSIS — E1169 Type 2 diabetes mellitus with other specified complication: Secondary | ICD-10-CM

## 2022-05-03 MED ORDER — ETODOLAC 500 MG PO TABS: ORAL_TABLET | ORAL | 0 refills | Status: DC

## 2022-05-03 NOTE — Addendum Note (Signed)
Addended by: Olin Hauser on: 05/03/2022 01:03 PM   Modules accepted: Orders

## 2022-05-06 MED ORDER — EMPAGLIFLOZIN 25 MG PO TABS: 25.0000 mg | ORAL_TABLET | Freq: Every day | ORAL | 0 refills | Status: AC

## 2022-05-06 MED ORDER — VALSARTAN-HYDROCHLOROTHIAZIDE 320-25 MG PO TABS: 1.0000 | ORAL_TABLET | Freq: Every day | ORAL | 0 refills | Status: AC

## 2022-05-06 MED ORDER — ATORVASTATIN CALCIUM 20 MG PO TABS: 20.0000 mg | ORAL_TABLET | Freq: Every day | ORAL | 0 refills | Status: AC

## 2022-05-06 MED ORDER — METFORMIN HCL ER 500 MG PO TB24: ORAL_TABLET | ORAL | 0 refills | Status: AC

## 2022-05-06 NOTE — Addendum Note (Signed)
Addended by: Olin Hauser on: 05/06/2022 02:50 PM   Modules accepted: Orders

## 2022-05-13 ENCOUNTER — Ambulatory Visit: Payer: Self-pay | Admitting: Dermatology

## 2022-06-20 ENCOUNTER — Encounter: Payer: Self-pay | Admitting: Family Medicine

## 2022-08-02 ENCOUNTER — Other Ambulatory Visit: Payer: Self-pay

## 2022-08-02 DIAGNOSIS — M1990 Unspecified osteoarthritis, unspecified site: Secondary | ICD-10-CM

## 2022-08-02 MED ORDER — ETODOLAC 500 MG PO TABS: ORAL_TABLET | ORAL | 0 refills | Status: AC
